# Patient Record
Sex: Female | Born: 1955 | Race: White | Hispanic: No | Smoking: Never smoker
Health system: Southern US, Community
[De-identification: ages and names within clinical notes are randomized; demographics above are authoritative.]

## PROBLEM LIST (undated history)

## (undated) DIAGNOSIS — R0789 Other chest pain: Secondary | ICD-10-CM

## (undated) DIAGNOSIS — F419 Anxiety disorder, unspecified: Secondary | ICD-10-CM

## (undated) DIAGNOSIS — I10 Essential (primary) hypertension: Secondary | ICD-10-CM

## (undated) DIAGNOSIS — R7303 Prediabetes: Secondary | ICD-10-CM

## (undated) DIAGNOSIS — F32A Depression, unspecified: Secondary | ICD-10-CM

## (undated) DIAGNOSIS — C50919 Malignant neoplasm of unspecified site of unspecified female breast: Secondary | ICD-10-CM

## (undated) DIAGNOSIS — K589 Irritable bowel syndrome without diarrhea: Secondary | ICD-10-CM

## (undated) DIAGNOSIS — F329 Major depressive disorder, single episode, unspecified: Secondary | ICD-10-CM

## (undated) DIAGNOSIS — S0990XA Unspecified injury of head, initial encounter: Secondary | ICD-10-CM

## (undated) DIAGNOSIS — R002 Palpitations: Secondary | ICD-10-CM

## (undated) HISTORY — DX: Malignant neoplasm of unspecified site of unspecified female breast: C50.919

## (undated) HISTORY — DX: Unspecified injury of head, initial encounter: S09.90XA

## (undated) HISTORY — DX: Irritable bowel syndrome, unspecified: K58.9

## (undated) HISTORY — PX: OTHER SURGICAL HISTORY: SHX169

## (undated) HISTORY — DX: Palpitations: R00.2

## (undated) HISTORY — DX: Anxiety disorder, unspecified: F41.9

## (undated) HISTORY — DX: Other chest pain: R07.89

---

## 1997-12-30 ENCOUNTER — Other Ambulatory Visit: Admission: RE | Admit: 1997-12-30 | Discharge: 1997-12-30 | Payer: Self-pay | Admitting: Obstetrics and Gynecology

## 1998-08-31 ENCOUNTER — Ambulatory Visit (HOSPITAL_COMMUNITY): Admission: RE | Admit: 1998-08-31 | Discharge: 1998-08-31 | Payer: Self-pay | Admitting: Obstetrics and Gynecology

## 1998-08-31 ENCOUNTER — Encounter: Payer: Self-pay | Admitting: Obstetrics and Gynecology

## 1998-09-02 ENCOUNTER — Encounter: Payer: Self-pay | Admitting: Obstetrics and Gynecology

## 1998-09-02 ENCOUNTER — Ambulatory Visit (HOSPITAL_COMMUNITY): Admission: RE | Admit: 1998-09-02 | Discharge: 1998-09-02 | Payer: Self-pay | Admitting: Obstetrics and Gynecology

## 1999-09-08 ENCOUNTER — Encounter: Payer: Self-pay | Admitting: Obstetrics & Gynecology

## 1999-09-08 ENCOUNTER — Ambulatory Visit (HOSPITAL_COMMUNITY): Admission: RE | Admit: 1999-09-08 | Discharge: 1999-09-08 | Payer: Self-pay | Admitting: Obstetrics & Gynecology

## 1999-09-15 ENCOUNTER — Ambulatory Visit (HOSPITAL_COMMUNITY): Admission: RE | Admit: 1999-09-15 | Discharge: 1999-09-15 | Payer: Self-pay | Admitting: Obstetrics & Gynecology

## 1999-09-15 ENCOUNTER — Encounter: Payer: Self-pay | Admitting: Obstetrics & Gynecology

## 1999-09-26 ENCOUNTER — Other Ambulatory Visit: Admission: RE | Admit: 1999-09-26 | Discharge: 1999-09-26 | Payer: Self-pay | Admitting: Obstetrics & Gynecology

## 2000-11-29 ENCOUNTER — Ambulatory Visit (HOSPITAL_COMMUNITY): Admission: RE | Admit: 2000-11-29 | Discharge: 2000-11-29 | Payer: Self-pay | Admitting: Family Medicine

## 2000-11-29 ENCOUNTER — Encounter: Payer: Self-pay | Admitting: Family Medicine

## 2002-02-26 ENCOUNTER — Encounter: Payer: Self-pay | Admitting: Unknown Physician Specialty

## 2002-02-26 ENCOUNTER — Ambulatory Visit (HOSPITAL_COMMUNITY): Admission: RE | Admit: 2002-02-26 | Discharge: 2002-02-26 | Payer: Self-pay | Admitting: Unknown Physician Specialty

## 2003-04-09 ENCOUNTER — Ambulatory Visit (HOSPITAL_COMMUNITY): Admission: RE | Admit: 2003-04-09 | Discharge: 2003-04-09 | Payer: Self-pay | Admitting: Unknown Physician Specialty

## 2003-04-09 ENCOUNTER — Encounter: Payer: Self-pay | Admitting: Unknown Physician Specialty

## 2004-04-21 ENCOUNTER — Ambulatory Visit (HOSPITAL_COMMUNITY): Admission: RE | Admit: 2004-04-21 | Discharge: 2004-04-21 | Payer: Self-pay

## 2004-04-29 ENCOUNTER — Other Ambulatory Visit: Admission: RE | Admit: 2004-04-29 | Discharge: 2004-04-29 | Payer: Self-pay | Admitting: Obstetrics & Gynecology

## 2005-05-26 ENCOUNTER — Ambulatory Visit (HOSPITAL_COMMUNITY): Admission: RE | Admit: 2005-05-26 | Discharge: 2005-05-26 | Payer: Self-pay | Admitting: Obstetrics & Gynecology

## 2005-10-19 ENCOUNTER — Other Ambulatory Visit: Admission: RE | Admit: 2005-10-19 | Discharge: 2005-10-19 | Payer: Self-pay | Admitting: Obstetrics & Gynecology

## 2006-01-01 ENCOUNTER — Ambulatory Visit: Payer: Self-pay | Admitting: Family Medicine

## 2006-01-04 ENCOUNTER — Ambulatory Visit: Payer: Self-pay | Admitting: Family Medicine

## 2006-01-15 ENCOUNTER — Ambulatory Visit: Payer: Self-pay | Admitting: Family Medicine

## 2006-02-19 ENCOUNTER — Ambulatory Visit: Payer: Self-pay | Admitting: Family Medicine

## 2006-04-03 ENCOUNTER — Ambulatory Visit: Payer: Self-pay | Admitting: Family Medicine

## 2006-05-02 ENCOUNTER — Ambulatory Visit: Payer: Self-pay | Admitting: Family Medicine

## 2006-05-11 ENCOUNTER — Ambulatory Visit: Payer: Self-pay | Admitting: Family Medicine

## 2006-05-30 ENCOUNTER — Ambulatory Visit (HOSPITAL_COMMUNITY): Admission: RE | Admit: 2006-05-30 | Discharge: 2006-05-30 | Payer: Self-pay | Admitting: Obstetrics & Gynecology

## 2006-06-04 ENCOUNTER — Ambulatory Visit: Payer: Self-pay | Admitting: Family Medicine

## 2006-06-15 ENCOUNTER — Ambulatory Visit: Payer: Self-pay | Admitting: Cardiovascular Disease

## 2006-06-19 ENCOUNTER — Telehealth: Payer: Self-pay | Admitting: Family Medicine

## 2006-09-04 ENCOUNTER — Ambulatory Visit: Payer: Self-pay | Admitting: Internal Medicine

## 2006-10-18 ENCOUNTER — Ambulatory Visit: Payer: Self-pay | Admitting: Internal Medicine

## 2006-10-22 ENCOUNTER — Ambulatory Visit: Payer: Self-pay | Admitting: Internal Medicine

## 2006-11-02 ENCOUNTER — Ambulatory Visit: Payer: Self-pay | Admitting: Internal Medicine

## 2006-11-16 ENCOUNTER — Ambulatory Visit: Payer: Self-pay | Admitting: Internal Medicine

## 2006-12-10 ENCOUNTER — Ambulatory Visit: Payer: Self-pay | Admitting: Internal Medicine

## 2007-01-14 ENCOUNTER — Ambulatory Visit: Payer: Self-pay | Admitting: Internal Medicine

## 2007-04-19 ENCOUNTER — Ambulatory Visit: Payer: Self-pay | Admitting: Cardiovascular Disease

## 2007-06-10 ENCOUNTER — Ambulatory Visit (HOSPITAL_COMMUNITY): Admission: RE | Admit: 2007-06-10 | Discharge: 2007-06-10 | Payer: Self-pay | Admitting: Obstetrics & Gynecology

## 2007-08-03 ENCOUNTER — Emergency Department (HOSPITAL_COMMUNITY): Admission: EM | Admit: 2007-08-03 | Discharge: 2007-08-03 | Payer: Self-pay | Admitting: Emergency Medicine

## 2007-08-24 ENCOUNTER — Emergency Department (HOSPITAL_COMMUNITY): Admission: EM | Admit: 2007-08-24 | Discharge: 2007-08-24 | Payer: Self-pay | Admitting: Family Medicine

## 2007-11-29 ENCOUNTER — Ambulatory Visit: Payer: Self-pay | Admitting: Cardiovascular Disease

## 2007-12-11 ENCOUNTER — Ambulatory Visit: Payer: Self-pay

## 2007-12-11 ENCOUNTER — Encounter: Payer: Self-pay | Admitting: Cardiovascular Disease

## 2008-03-25 ENCOUNTER — Ambulatory Visit: Payer: Self-pay | Admitting: Obstetrics & Gynecology

## 2008-03-25 ENCOUNTER — Encounter: Payer: Self-pay | Admitting: Obstetrics & Gynecology

## 2008-04-23 ENCOUNTER — Ambulatory Visit: Payer: Self-pay | Admitting: Family Medicine

## 2008-05-19 ENCOUNTER — Telehealth (INDEPENDENT_AMBULATORY_CARE_PROVIDER_SITE_OTHER): Payer: Self-pay | Admitting: Occupational Medicine

## 2008-06-30 ENCOUNTER — Ambulatory Visit (HOSPITAL_COMMUNITY): Admission: RE | Admit: 2008-06-30 | Discharge: 2008-06-30 | Payer: Self-pay | Admitting: Obstetrics & Gynecology

## 2008-09-17 ENCOUNTER — Encounter: Payer: Self-pay | Admitting: Cardiovascular Disease

## 2008-09-17 ENCOUNTER — Ambulatory Visit: Payer: Self-pay | Admitting: Cardiovascular Disease

## 2008-09-25 ENCOUNTER — Telehealth (INDEPENDENT_AMBULATORY_CARE_PROVIDER_SITE_OTHER): Payer: Self-pay | Admitting: *Deleted

## 2008-10-23 ENCOUNTER — Emergency Department (HOSPITAL_COMMUNITY): Admission: EM | Admit: 2008-10-23 | Discharge: 2008-10-23 | Payer: Self-pay | Admitting: Emergency Medicine

## 2008-12-10 ENCOUNTER — Ambulatory Visit: Payer: Self-pay | Admitting: Family Medicine

## 2009-02-22 ENCOUNTER — Telehealth (INDEPENDENT_AMBULATORY_CARE_PROVIDER_SITE_OTHER): Payer: Self-pay | Admitting: *Deleted

## 2009-02-23 ENCOUNTER — Encounter: Payer: Self-pay | Admitting: Cardiovascular Disease

## 2009-04-21 ENCOUNTER — Ambulatory Visit: Payer: Self-pay | Admitting: Cardiology

## 2009-04-21 ENCOUNTER — Encounter: Payer: Self-pay | Admitting: Cardiology

## 2009-04-28 ENCOUNTER — Ambulatory Visit: Payer: Self-pay | Admitting: Obstetrics & Gynecology

## 2009-04-28 ENCOUNTER — Encounter: Payer: Self-pay | Admitting: Obstetrics & Gynecology

## 2009-08-05 ENCOUNTER — Ambulatory Visit (HOSPITAL_COMMUNITY): Admission: RE | Admit: 2009-08-05 | Discharge: 2009-08-05 | Payer: Self-pay | Admitting: Obstetrics & Gynecology

## 2009-08-19 ENCOUNTER — Ambulatory Visit: Payer: Self-pay | Admitting: Family Medicine

## 2009-08-21 ENCOUNTER — Ambulatory Visit: Payer: Self-pay | Admitting: Family Medicine

## 2009-08-21 ENCOUNTER — Telehealth: Payer: Self-pay | Admitting: Family Medicine

## 2009-12-09 ENCOUNTER — Telehealth (INDEPENDENT_AMBULATORY_CARE_PROVIDER_SITE_OTHER): Payer: Self-pay | Admitting: *Deleted

## 2010-01-14 ENCOUNTER — Telehealth: Payer: Self-pay | Admitting: Cardiology

## 2010-01-17 ENCOUNTER — Telehealth: Payer: Self-pay | Admitting: Cardiology

## 2010-01-19 ENCOUNTER — Ambulatory Visit: Payer: Self-pay | Admitting: Obstetrics & Gynecology

## 2010-02-08 ENCOUNTER — Telehealth: Payer: Self-pay | Admitting: Cardiovascular Disease

## 2010-03-21 ENCOUNTER — Telehealth: Payer: Self-pay | Admitting: Cardiology

## 2010-03-23 ENCOUNTER — Ambulatory Visit: Payer: Self-pay | Admitting: Family Medicine

## 2010-03-25 ENCOUNTER — Telehealth (INDEPENDENT_AMBULATORY_CARE_PROVIDER_SITE_OTHER): Payer: Self-pay | Admitting: *Deleted

## 2010-03-29 ENCOUNTER — Ambulatory Visit: Payer: Self-pay | Admitting: Cardiovascular Disease

## 2010-06-29 ENCOUNTER — Ambulatory Visit: Payer: Self-pay | Admitting: Obstetrics & Gynecology

## 2010-08-24 ENCOUNTER — Ambulatory Visit
Admission: RE | Admit: 2010-08-24 | Discharge: 2010-08-24 | Payer: Self-pay | Source: Home / Self Care | Admitting: Emergency Medicine

## 2010-08-24 ENCOUNTER — Ambulatory Visit (HOSPITAL_COMMUNITY): Admission: RE | Admit: 2010-08-24 | Payer: Self-pay | Source: Home / Self Care | Admitting: Obstetrics & Gynecology

## 2010-08-30 ENCOUNTER — Telehealth: Payer: Self-pay | Admitting: Cardiovascular Disease

## 2010-08-31 ENCOUNTER — Encounter: Payer: Self-pay | Admitting: Obstetrics & Gynecology

## 2010-09-01 NOTE — Progress Notes (Signed)
Summary: QUESTIONS RE LAB  Phone Note Call from Patient Call back at Home Phone [phone]   Caller: Patient [phone] Reason for Call: Talk to Nurse Summary of Call: PT LIVES IN [place] AND DOESN'T HAVE A PC DR-WANTS TO KNOW IF SHE CAN GET LAB WORK DONE AT THE [place] [facility], PLS CALL Initial call taken by: Glynda Jaeger,  January 14, 2010 9:53 AM  Follow-up for Phone Call        spoke with pt, she would like to have her cholestrol checked. she is going to see the GYN next wednesday. she will call after that appt to get paperwork for labs [name], RN  January 14, 2010 6:06 PM

## 2010-09-01 NOTE — Progress Notes (Signed)
Summary: refill request  Phone Note Refill Request Message from:  Patient on March 21, 2010 8:49 AM  Refills Requested: Medication #1:  CITALOPRAM HYDROBROMIDE 10 MG TABS Take 1 tablet by mouth once a day [facility] [phone] request [place] to refill-doesn't have a pcp right now   Method Requested: Telephone to Pharmacy Initial call taken by: Glynda Jaeger,  March 21, 2010 8:50 AM Caller: Patient    Prescriptions: CITALOPRAM HYDROBROMIDE 10 MG TABS (CITALOPRAM HYDROBROMIDE) Take 1 tablet by mouth once a day  #30 x 5   Entered by:   Kem Parkinson   Authorized by:   Ferman Hamming, MD, [facility]   Signed by:   Kem Parkinson on 03/21/2010   Method used:   [facility]ronically to        [phone][facility] (retail)       [address]       [place], [place]  [address]       Ph: [phone] or [phone]       Fax: [phone]   RxID:   [id number]

## 2010-09-01 NOTE — Assessment & Plan Note (Signed)
Summary: rov/ gd   History of Present Illness: [name] returns today for followup.  She's had atypical chest pain palpitations and dyspnea.  She continues to have significant anxiety.  Unfortunately she does not have a primary care doctor.  3 years ago she was kicked out at Dr. Raphael Gibney practice and [name] primary [facility].  She tends to have a lot of stress with her relationship with her family in particular her son.  This seemed to improve slightly but still is a big problem.  Said occasional episodes of atypical chest pain that clearly related to excess coffee intake and anxiety.  He had a normal stress echo last year.  She also has occasional palpitations.  They summoned 9 and also related to stress and caffeine use.  He denies taking any benzodiazepines at this point.  She would like to get reestablished with a primary care doctor.  I told her we try to get [facility] into [facility].  she appears to have some mild chronic bronchitis.  I believe she has a single core inhaler that was given to her by Dr. Jonny Ruiz at one point.  He complains of occasional hoarseness and a dry cough but no frank asthma or wheezing.  Current Problems (verified): 1)  Paronychia, Great Toe  (ICD-681.11) 2)  Otitis Media, Serous, Acute, Left  (ICD-381.01) 3)  Acute Sinusitis, Unspecified  (ICD-461.9) 4)  Ankle Sprain, Left  (ICD-845.00) 5)  Palpitations  (ICD-785.1) 6)  Dyspnea  (ICD-786.05) 7)  Chest Pain, Atypical, Hx of  (ICD-V15.89) 8)  Other Anxiety States  (ICD-300.09)  Current Medications (verified): 1)  Citalopram Hydrobromide 10 Mg Tabs (Citalopram Hydrobromide) .... Take 1 Tablet By Mouth Once A Day 2)  Symbicort 160-4.5 Mcg/act Aero (Budesonide-Formoterol Fumarate) .... 2 Puffs Twice A Day 3)  Activella 0.5-0.1 Mg Tabs (Estradiol-Norethindrone Acet) .... Take 1 Tablet By Mouth Once A Day 4)  Calcium Carbonate-Vitamin D 600-400 Mg-Unit  Tabs (Calcium Carbonate-Vitamin D) .... Take 1 Tablet By Mouth Two  Times A Day 5)  Garlic Oil 1000 Mg Caps (Garlic) .... Take 1 Capsule By Mouth Two Times A Day 6)  Fish Oil 1000 Mg Caps (Omega-3 Fatty Acids) .... Take 1 Capsule By Mouth Once A Day 7)  Vitamin B Complex-C   Caps (B Complex-C) .... Take 1 Capsule By Mouth Once A Day 8)  Vitamin C 500 Mg  Tabs (Ascorbic Acid) .... Take 1 Tablet By Mouth Once A Day 9)  Cephalexin 500 Mg Caps (Cephalexin) .... One By Mouth Two Times A Day 10)  Proair Hfa 108 (90 Base) Mcg/act Aers (Albuterol Sulfate) .... Two Inhalations Q4-6hr As Needed.  Max 12 Puffs/day  Allergies (verified): 1)  ! * Penicilin  Past History:  Past Medical History: Last updated: 03/23/2010 heart palpitations, hx of MVP chest pain , atypical Anxiety IBS asthma  Past Surgical History: Last updated: 05/08/2006 eAB x 2  Family History: Last updated: 05/08/2006 brother ETOH, brother PTSD, father died at 59 of AMI, mother had breast ca at 5, died of lung ca, depression  Social History: Last updated: 03/23/2010 Divorced.  Grown son lives in [place].  Nonsmoker. Alcohol use-no Drug use-no  Review of Systems       Denies fever, malais, weight loss, blurry vision, decreased visual acuity, cough, sputum,  hemoptysis, pleuritic pain, , heartburn, abdominal pain, melena, lower extremity edema, claudication, or rash.   Vital Signs:  Patient profile:   55 year old female Height:      85  inches Weight:      192 pounds BMI:     32.07 Pulse rate:   65 / minute Resp:     16 per minute BP sitting:   128 / 76  (left arm)  Vitals Entered By: Kem Parkinson (March 29, 2010 11:10 AM)  Physical Exam  General:  Affect appropriate Healthy:  appears stated age HEENT: normal Neck supple with no adenopathy JVP normal no bruits no thyromegaly Lungs clear with no wheezing and good diaphragmatic motion Heart:  S1/S2 no murmur,rub, gallop or click PMI normal Abdomen: benighn, BS positve, no tenderness, no AAA no bruit.  No HSM or  HJR Distal pulses intact with no bruits No edema Neuro non-focal Skin warm and dry    Impression & Recommendations:  Problem # 1:  PALPITATIONS (ICD-785.1) Benign related to anxiety  Problem # 2:  DYSPNEA (ICD-786.05) Functional.  Normal EF and normal cardiopulmonary exam  ? Component of lung disease.  Will try to fill out forms for patient  assistance and Symbicort  Problem # 3:  CHEST PAIN, ATYPICAL, HX OF (ICD-V15.89) Atypical with previously normal stress test.  No need for further w/u at this time  Patient Instructions: 1)  Your physician recommends that you schedule a follow-up appointment in: ONE YEAR

## 2010-09-01 NOTE — Assessment & Plan Note (Signed)
Summary: CHK ON TOENAIL AND BREATHING PROBLEMS (5)   Vital Signs:  Patient Profile:   55 Years Old Female CC:      right great toe pain, SOB at times  Height:     65 inches Weight:      193 pounds O2 Sat:      97 % O2 treatment:    Room Air Temp:     98.2 degrees F oral Pulse rate:   74 / minute Resp:     16 per minute BP sitting:   134 / 82  (left arm) Cuff size:   regular  Pt. in pain?   no  Vitals Entered By: Lajean Saver RN (March 23, 2010 8:30 AM)                   Updated Prior Medication List: CITALOPRAM HYDROBROMIDE 10 MG TABS (CITALOPRAM HYDROBROMIDE) Take 1 tablet by mouth once a day SYMBICORT 160-4.5 MCG/ACT AERO (BUDESONIDE-FORMOTEROL FUMARATE) 2 puffs twice a day ACTIVELLA 0.5-0.1 MG TABS (ESTRADIOL-NORETHINDRONE ACET) Take 1 tablet by mouth once a day CALCIUM CARBONATE-VITAMIN D 600-400 MG-UNIT  TABS (CALCIUM CARBONATE-VITAMIN D) Take 1 tablet by mouth two times a day GARLIC OIL 1000 MG CAPS (GARLIC) Take 1 capsule by mouth two times a day FISH OIL 1000 MG CAPS (OMEGA-3 FATTY ACIDS) Take 1 capsule by mouth once a day VITAMIN B COMPLEX-C   CAPS (B COMPLEX-C) Take 1 capsule by mouth once a day VITAMIN C 500 MG  TABS (ASCORBIC ACID) Take 1 tablet by mouth once a day  Current Allergies (reviewed today): ! * PENICILINHistory of Present Illness Chief Complaint: right great toe pain, SOB at times  History of Present Illness:  Subjective:  Patient presents with two complaints: 1)  Several day history of pain in her right great toenail.  She manipulated the medial edge that she thought was ingrown and a small amount of pus came out; now toe feels better. 2)  She complains of mild cough and congestion in the mornings, resolving after she is up and about.  She has resumed her symbicort inhaler as perscribed.  No chest pain.  She occasionally feels shortness of breath that resolves when she clears her throat.  No swelling in legs.  Her shortness of breath improves with  albuterol.   REVIEW OF SYSTEMS Constitutional Symptoms      Denies fever, chills, night sweats, weight loss, weight gain, and fatigue.  Eyes       Denies change in vision, eye pain, eye discharge, glasses, contact lenses, and eye surgery. Ear/Nose/Throat/Mouth       Denies hearing loss/aids, change in hearing, ear pain, ear discharge, dizziness, frequent runny nose, frequent nose bleeds, sinus problems, sore throat, hoarseness, and tooth pain or bleeding.  Respiratory       Complains of dry cough and shortness of breath.      Denies productive cough, wheezing, asthma, bronchitis, and emphysema/COPD.  Cardiovascular       Denies murmurs, chest pain, and tires easily with exhertion.    Gastrointestinal       Denies stomach pain, nausea/vomiting, diarrhea, constipation, blood in bowel movements, and indigestion. Genitourniary       Denies painful urination, kidney stones, and loss of urinary control. Neurological       Denies paralysis, seizures, and fainting/blackouts. Musculoskeletal       Denies muscle pain, joint pain, joint stiffness, decreased range of motion, redness, swelling, muscle weakness, and gout.  Skin  Complains of hair/skni or nail changes.      Denies bruising and unusual mles/lumps or sores.      Comments: right great toe Psych       Denies mood changes, temper/anger issues, anxiety/stress, speech problems, depression, and sleep problems. Other Comments: Patient removed ingrown toenail couple days ago from right great toe, wants doctor to check on it. She c/o of periods of SOB recently and states she wanst to be "checked out". She does not appear to be in any current distress. 02sat 97%, RR 16   Past History:  Past Medical History: heart palpitations, hx of MVP chest pain , atypical Anxiety IBS asthma  Family History: Reviewed history from 05/08/2006 and no changes required. brother ETOH, brother PTSD, father died at 17 of AMI, mother had breast ca at 71,  died of lung ca, depression  Social History: Divorced.  Grown son lives in [place].  Nonsmoker. Alcohol use-no Drug use-no Drug Use:  no   Objective:  Appearance:  Patient appears obese but otherwise healthy, stated age, and in no acute distress  Eyes:  Pupils are equal, round, and reactive to light and accomdation.  Extraocular movement is intact.  Conjunctivae are not inflamed.  Pharynx:  Normal  Neck:  Supple.  No adenopathy is present.  No thyromegaly is present  Lungs:  Clear to auscultation.  Breath sounds are equal.  Heart:  Regular rate and rhythm without murmurs, rubs, or gallops.  Abdomen:  Nontender without masses or hepatosplenomegaly.  Bowel sounds are present.  No CVA or flank tenderness.  Extremities:  No edema.  Pedal pulses are full and equal.  Right great toe:  No swelling or erythema.  Mild tenderness medial edge of toe; does not presently appear ingrown. Assessment  Assessed DYSPNEA as deteriorated - Donna Christen MD New Problems: PARONYCHIA, GREAT TOE (ICD-681.11)  SUSPECT REFLUX AS SOURCE OF MORNING COUGH, EXACERBATED BY OBESITY AND ASTHMA.  Note that patient had evaluation by cardiologist Dr. Jens Som last year. MILD PARONYCHIA RIGHT GREAT TOE.  Plan New Medications/Changes: PROAIR HFA 108 (90 BASE) MCG/ACT AERS (ALBUTEROL SULFATE) Two inhalations q4-6hr as needed.  Max 12 puffs/day  #1 MDI x 0, 03/23/2010, Donna Christen MD CEPHALEXIN 500 MG CAPS (CEPHALEXIN) One by mouth two times a day  #14 x 0, 03/23/2010, Donna Christen MD  New Orders: Est. Patient Level IV [16109] Planning Comments:   Begin Keflex for 5 days; warm soaks to toe. Given a Mickel Crow patient information and instruction sheet on topic ingrown nails. Trial of Zantac 75, one at bedtime for a month.  Given a Water quality scientist patient information and instruction sheet on topic GERD. Continue Symbicort inhaler.  Rx for new albuterol rescue inhaler.  Suggest high efficiency filter in her heat pump  system. Recommend diet, exercise, and weight loss.  Follow-up with PCP if not improving.   The patient and/or caregiver has been counseled thoroughly with regard to medications prescribed including dosage, schedule, interactions, rationale for use, and possible side effects and they verbalize understanding.  Diagnoses and expected course of recovery discussed and will return if not improved as expected or if the condition worsens. Patient and/or caregiver verbalized understanding.  Prescriptions: PROAIR HFA 108 (90 BASE) MCG/ACT AERS (ALBUTEROL SULFATE) Two inhalations q4-6hr as needed.  Max 12 puffs/day  #1 MDI x 0   Entered and Authorized by:   Donna Christen MD   Signed by:   Donna Christen MD on 03/23/2010   Method used:   Print then Give  to Patient   RxID:   [id number] CEPHALEXIN 500 MG CAPS (CEPHALEXIN) One by mouth two times a day  #14 x 0   Entered and Authorized by:   Donna Christen MD   Signed by:   Donna Christen MD on 03/23/2010   Method used:   Print then Give to Patient   RxID:   [id number]   Patient Instructions: 1)  Elevate head of bed. 2)  Begin Zantac 75, one tab at bedtime. 3)  Avoid bedtime snacks. 4)  Resume Symbicort   Orders Added: 1)  Est. Patient Level IV [95284]

## 2010-09-01 NOTE — Assessment & Plan Note (Signed)
Summary: BOTH EARS STOPPED UP/WB room 5   Vital Signs:  Patient Profile:   55 Years Old Female CC:      bilateral ear pain Height:     65 inches Weight:      189.8 pounds O2 Sat:      97 % O2 treatment:    Room Air Temp:     98.5 degrees F oral Pulse rate:   90 / minute Resp:     18 per minute BP sitting:   144 / 78  (left arm) Cuff size:   regular  Vitals Entered By: Clemens Catholic LPN (August 24, 2010 8:23 AM)                  Updated Prior Medication List: CITALOPRAM HYDROBROMIDE 10 MG TABS (CITALOPRAM HYDROBROMIDE) Take 1 tablet by mouth once a day SYMBICORT 160-4.5 MCG/ACT AERO (BUDESONIDE-FORMOTEROL FUMARATE) 2 puffs twice a day ACTIVELLA 0.5-0.1 MG TABS (ESTRADIOL-NORETHINDRONE ACET) Take 1 tablet by mouth once a day CALCIUM CARBONATE-VITAMIN D 600-400 MG-UNIT  TABS (CALCIUM CARBONATE-VITAMIN D) Take 1 tablet by mouth two times a day GARLIC OIL 1000 MG CAPS (GARLIC) Take 1 capsule by mouth two times a day FISH OIL 1000 MG CAPS (OMEGA-3 FATTY ACIDS) Take 1 capsule by mouth once a day VITAMIN B COMPLEX-C   CAPS (B COMPLEX-C) Take 1 capsule by mouth once a day VITAMIN C 500 MG  TABS (ASCORBIC ACID) Take 1 tablet by mouth once a day  Current Allergies (reviewed today): ! * PENICILINHistory of Present Illness Chief Complaint: bilateral ear pain History of Present Illness: 55 Years Old Female complains of onset of cold symptoms fora few weeks.  Emaley has been using Afrin which is helping a little bit. No sore throat + cough No pleuritic pain No wheezing + nasal congestion + post-nasal drainage No sinus pain/pressure No chest congestion No itchy/red eyes + earache (feels stopped up) No hemoptysis No SOB No chills/sweats No fever No nausea No vomiting No abdominal pain No diarrhea No skin rashes No fatigue No myalgias No headache   REVIEW OF SYSTEMS Constitutional Symptoms       Complains of fatigue.     Denies fever, chills, night sweats, weight loss,  and weight gain.  Eyes       Denies change in vision, eye pain, eye discharge, glasses, contact lenses, and eye surgery. Ear/Nose/Throat/Mouth       Complains of change in hearing, ear discharge, frequent runny nose, sinus problems, sore throat, and hoarseness.      Denies hearing loss/aids, ear pain, dizziness, frequent nose bleeds, and tooth pain or bleeding.  Respiratory       Denies dry cough, productive cough, wheezing, shortness of breath, asthma, bronchitis, and emphysema/COPD.  Cardiovascular       Complains of tires easily with exhertion.      Denies murmurs and chest pain.    Gastrointestinal       Denies stomach pain, nausea/vomiting, diarrhea, constipation, blood in bowel movements, and indigestion. Genitourniary       Denies painful urination, kidney stones, and loss of urinary control. Neurological       Denies paralysis, seizures, and fainting/blackouts. Musculoskeletal       Denies muscle pain, joint pain, joint stiffness, decreased range of motion, redness, swelling, muscle weakness, and gout.  Skin       Denies bruising, unusual mles/lumps or sores, and hair/skin or nail changes.  Psych       Denies mood changes,  temper/anger issues, anxiety/stress, speech problems, depression, and sleep problems. Other Comments: pt c/o humming in LT ear for a few wks. worse for the past 3-4 days. now bilateral feels clogged/swimmers ear. she also states that she feels fatigued and has some allergy s/s possible related to her dog and dust.   Past History:  Past Medical History: Reviewed history from 03/23/2010 and no changes required. heart palpitations, hx of MVP chest pain , atypical Anxiety IBS asthma  Past Surgical History: Reviewed history from 05/08/2006 and no changes required. eAB x 2  Family History: Reviewed history from 05/08/2006 and no changes required. brother ETOH, brother PTSD, father died at 38 of AMI, mother had breast ca at 49, died of lung ca,  depression  Social History: Reviewed history from 03/23/2010 and no changes required. Divorced.  Grown son lives in [place].  Nonsmoker. Alcohol use-no Drug use-no Physical Exam General appearance: well developed, well nourished, no acute distress Ears: clear fluid behind both TM's, no erythema, no sign of infection Nasal: clear discharge Oral/Pharynx: clear PND, no erythema Neck: neck supple,  trachea midline, no masses Chest/Lungs: no rales, wheezes, or rhonchi bilateral, breath sounds equal without effort Heart: regular rate and  rhythm, no murmur MSE: oriented to time, place, and person Assessment New Problems: ALLERGIC RHINITIS (ICD-477.9)   Plan New Medications/Changes: FLONASE 50 MCG/ACT SUSP (FLUTICASONE PROPIONATE) 2 sprays both nostrils QAM  #1 x 3, 08/24/2010, Hoyt Koch MD  New Orders: Est. Patient Level III [phone] Pulse Oximetry (single measurment) [94760] Planning Comments:   Claritin daily for a month Sudafed 12-hour twice a day for 5 days Rx for Flonase Increase water intake Follow-up with your primary care physician if not improving or if getting worse   The patient and/or caregiver has been counseled thoroughly with regard to medications prescribed including dosage, schedule, interactions, rationale for use, and possible side effects and they verbalize understanding.  Diagnoses and expected course of recovery discussed and will return if not improved as expected or if the condition worsens. Patient and/or caregiver verbalized understanding.  Prescriptions: FLONASE 50 MCG/ACT SUSP (FLUTICASONE PROPIONATE) 2 sprays both nostrils QAM  #1 x 3   Entered and Authorized by:   Hoyt Koch MD   Signed by:   Hoyt Koch MD on 08/24/2010   Method used:   Print then Give to Patient   RxID:   [id number]   Orders Added: 1)  Est. Patient Level III [95621] 2)  Pulse Oximetry (single measurment) [30865]

## 2010-09-01 NOTE — Progress Notes (Signed)
Summary: speak to nurse/blood work  Phone Note Call from Patient Call back at [phone]   Reason for Call: Talk to Nurse Summary of Call: request to speak to the nurse about blood work Initial call taken by: Migdalia Dk,  January 17, 2010 8:32 AM  Follow-up for Phone Call        spoke with pt, she would like to have some blood work checked. she wants to check her blood sugar, lipids,cbc and tsh. she does not have a primary care md and ask if we could [facility] [place]. she states she feel sluggish. she has an appt with her GYN this week and will add her blood work to ours. will get okay from dr Jens Som to order [name], RN  January 17, 2010 3:59 PM   Additional Follow-up for Phone Call Additional follow up Details #1::        Would ask patient to schedule with a primary care (possibly Dr. Cathey Endow or Dr. Linford Arnold if she lives in [place]). Ferman Hamming, MD, [facility]  January 18, 2010 12:45 PM  Left message to call back [name], RN  January 19, 2010 11:57 AM  per pt calling back to speak with [name].  spoke w/Debra advised Dr Jens Som wanted her to get a pcp she states she has no Insurance so no pcps will accept her b/c she cant pay $200-300 up front, she states she use to see Dr Cathey Endow and was kicked out of their pratice to a disagreement over her care, will let Dr Jens Som know Meredith Staggers, RN  January 19, 2010 1:01 PM   Per pt calling is aware of the message that was pass along from dr. Jens Som. pt wants to see if Dr. Eden Emms will approved blood work since rapor with him. [phone] Lorne Skeens  January 20, 2010 8:52 AM      Additional Follow-up for Phone Call Additional follow up Details #2::    Patient needs a primary care physician to follow noncardiac issues. Ferman Hamming, MD, [facility]  January 19, 2010 2:37 PM

## 2010-09-01 NOTE — Progress Notes (Signed)
  Phone Note Outgoing Call Call back at [facility] Phone [phone]   Call placed by: Lajean Saver RN,  March 25, 2010 2:17 PM Call placed to: Patient Action Taken: Phone Call Completed Summary of Call: Call back: Patient says she just filled the rx today and began taking it as prescribed. Her toe is improving and she is using 2 puffs of her inhaler instead of 1. She had not further questions.

## 2010-09-01 NOTE — Progress Notes (Signed)
  Phone Note Call from Patient   Caller: Patient Summary of Call: Patient called states that you gave her Albuterol Inhaler back in January. She can not afford to pay for it. Is there anyway to get her a sample of the medication.Please call [phone]. Nh Initial call taken by: Dannette Barbara,  Dec 09, 2009 9:01 AM    WE DO NOT HAVE ANY SAMPLES

## 2010-09-01 NOTE — Assessment & Plan Note (Signed)
Summary: SINUS & COUGH/KH   Vital Signs:  Patient Profile:   55 Years Old Female CC:      Cold & URI symptoms Height:     65 inches Weight:      188 pounds O2 Sat:      99 % O2 treatment:    Room Air Temp:     98.5 degrees F oral Pulse rate:   89 / minute Resp:     16 per minute BP sitting:   140 / 84  (right arm)  Pt. in pain?   yes    Location:   head    Intensity:   3    Type:       heaviness  Vitals Entered By: Dorise Hiss                   Updated Prior Medication List: CITALOPRAM HYDROBROMIDE 10 MG TABS (CITALOPRAM HYDROBROMIDE) Take 1 tablet by mouth once a day SYMBICORT 160-4.5 MCG/ACT AERO (BUDESONIDE-FORMOTEROL FUMARATE) 2 puffs twice a day ACTIVELLA 0.5-0.1 MG TABS (ESTRADIOL-NORETHINDRONE ACET) Take 1 tablet by mouth once a day CALCIUM CARBONATE-VITAMIN D 600-400 MG-UNIT  TABS (CALCIUM CARBONATE-VITAMIN D) Take 1 tablet by mouth two times a day GARLIC OIL 1000 MG CAPS (GARLIC) Take 1 capsule by mouth two times a day FISH OIL 1000 MG CAPS (OMEGA-3 FATTY ACIDS) Take 1 capsule by mouth once a day VITAMIN B COMPLEX-C   CAPS (B COMPLEX-C) Take 1 capsule by mouth once a day VITAMIN C 500 MG  TABS (ASCORBIC ACID) Take 1 tablet by mouth once a day  Current Allergies (reviewed today): ! * PENICILIN History of Present Illness History from: patient Chief Complaint: Cold & URI symptoms History of Present Illness: AS ABOVE. ONSET 4 DAYS AGO WITH SINUS CONGESTION AND RHINORRHEA. HAD FEVER 100.1 THIS AM. ADMITS TO FACIAL PAIN AND PRESSURE IN EARS. MILD SORE THROAT AND OCC COUGH. TOOK ADVILL AND OTC VIT C.   Current Meds CITALOPRAM HYDROBROMIDE 10 MG TABS (CITALOPRAM HYDROBROMIDE) Take 1 tablet by mouth once a day SYMBICORT 160-4.5 MCG/ACT AERO (BUDESONIDE-FORMOTEROL FUMARATE) 2 puffs twice a day ACTIVELLA 0.5-0.1 MG TABS (ESTRADIOL-NORETHINDRONE ACET) Take 1 tablet by mouth once a day CALCIUM CARBONATE-VITAMIN D 600-400 MG-UNIT  TABS (CALCIUM CARBONATE-VITAMIN D)  Take 1 tablet by mouth two times a day GARLIC OIL 1000 MG CAPS (GARLIC) Take 1 capsule by mouth two times a day FISH OIL 1000 MG CAPS (OMEGA-3 FATTY ACIDS) Take 1 capsule by mouth once a day VITAMIN B COMPLEX-C   CAPS (B COMPLEX-C) Take 1 capsule by mouth once a day VITAMIN C 500 MG  TABS (ASCORBIC ACID) Take 1 tablet by mouth once a day ZITHROMAX TRI-PAK 500 MG TABS (AZITHROMYCIN) TAKE AS DIRECTED  REVIEW OF SYSTEMS Constitutional Symptoms       Complains of fever.     Denies chills, night sweats, weight loss, weight gain, and fatigue.  Eyes       Denies change in vision, eye pain, eye discharge, glasses, contact lenses, and eye surgery. Ear/Nose/Throat/Mouth       Complains of ear discharge, frequent runny nose, sinus problems, and sore throat.      Denies hearing loss/aids, change in hearing, ear pain, dizziness, frequent nose bleeds, hoarseness, and tooth pain or bleeding.  Respiratory       Complains of productive cough.      Denies dry cough, wheezing, shortness of breath, asthma, bronchitis, and emphysema/COPD.  Cardiovascular  Denies murmurs, chest pain, and tires easily with exhertion.    Gastrointestinal       Denies stomach pain, nausea/vomiting, diarrhea, constipation, blood in bowel movements, and indigestion. Genitourniary       Denies painful urination, kidney stones, and loss of urinary control. Neurological       Complains of headaches.      Denies paralysis, seizures, and fainting/blackouts. Musculoskeletal       Denies muscle pain, joint pain, joint stiffness, decreased range of motion, redness, swelling, muscle weakness, and gout.  Skin       Denies bruising, unusual mles/lumps or sores, and hair/skin or nail changes.      Comments: small red rash to Left abdomen Psych       Denies mood changes, temper/anger issues, anxiety/stress, speech problems, depression, and sleep problems.  Past History:  Past Medical History: Last updated: 04/21/2009 heart  palpitations, hx of MVP chest pain , atypical dyspnea related to anxiety Anxiety IBS asthma  Past Surgical History: Last updated: 05/08/2006 eAB x 2  Family History: Last updated: 05/08/2006 brother ETOH, brother PTSD, father died at 71 of AMI, mother had breast ca at 85, died of lung ca, depression  Family History: Reviewed history from 05/08/2006 and no changes required. brother ETOH, brother PTSD, father died at 39 of AMI, mother had breast ca at 9, died of lung ca, depression  Social History: Reviewed history from 04/21/2009 and no changes required. Divorced.  Grown son lives in [place].   Likes to walk and wants to lose wt.   Nonsmoker. Full Time Physical Exam General appearance: well developed, well nourished, no acute distress Head: normocephalic, atraumatic Ears: LEFT TM SLIGHTLY RED Nasal: CONGESTED Oral/Pharynx: tongue normal, posterior pharynx without erythema or exudate Chest/Lungs: no rales, wheezes, or rhonchi bilateral, breath sounds equal without effort Heart: regular rate and  rhythm, no murmur Skin: no obvious rashes or lesions Assessment New Problems: ACUTE SINUSITIS, UNSPECIFIED (ICD-461.9)   Plan New Medications/Changes: ZITHROMAX TRI-PAK 500 MG TABS (AZITHROMYCIN) TAKE AS DIRECTED  #1 x 0, 08/19/2009, Marvis Moeller DO  New Orders: Est. Patient Level III [99213]   Prescriptions: ZITHROMAX TRI-PAK 500 MG TABS (AZITHROMYCIN) TAKE AS DIRECTED  #1 x 0   Entered and Authorized by:   Marvis Moeller DO   Signed by:   Marvis Moeller DO on 08/19/2009   Method used:   [facility] to        [facility] [phone][facility] (retail)       [address]       [place], [place]  [address]       Ph: [phone] or [phone]       Fax: [phone]   RxID:   [id number]   Patient Instructions: 1)  TYLENOL OR MOTRIN AS NEEDED. MUCINEX D RECOMMENDED. AVOID CAFFEINE AND MILK PRODUCTS. FOLLOW UP WITH YOUR PCP IF SYMPTOMS PERSIST OR WORSEN.

## 2010-09-01 NOTE — Progress Notes (Signed)
Summary: having some irregular heartbeats  Phone Note Call from Patient   Caller: Patient Reason for Call: Talk to Nurse Summary of Call: pt has appt 7-29 but having a lot of stress-having irregular heart beats x 2 days-and really fatigued-took advil and irregular heartbeats have dissapated-pls call [phone] Initial call taken by: Glynda Jaeger,  February 08, 2010 11:54 AM  Follow-up for Phone Call        spoke with pt, questions answered [name], RN  February 08, 2010 3:01 PM

## 2010-09-01 NOTE — Assessment & Plan Note (Signed)
Summary: Cough-yellowish rm 2   Vital Signs:  Patient Profile:   55 Years Old Female CC:      Cold & URI symptoms Height:     65 inches Weight:      187 pounds O2 Sat:      100 % O2 treatment:    Room Air Temp:     97.7 degrees F oral Pulse rate:   71 / minute Pulse rhythm:   regular Resp:     16 per minute BP sitting:   135 / 85  (right arm) Cuff size:   regular  Vitals Entered By: Areta Haber CMA (August 21, 2009 9:33 AM)                  Current Allergies: ! * PENICILIN  History of Present Illness Chief Complaint: Cold & URI symptoms History of Present Illness: Subjective:  Patient complains of persistent sinus pressure despite her azithromycin that was started two days ago.  She has a non-productive cough.  She has night sweats but no fever.  She has a history of chronic rhinitis that is not responding to an OTC allergy med.  No pleuritic pain or shortness of breath  Current Problems: OTITIS MEDIA, SEROUS, ACUTE, LEFT (ICD-381.01) ACUTE SINUSITIS, UNSPECIFIED (ICD-461.9) ANKLE SPRAIN, LEFT (ICD-845.00) PALPITATIONS (ICD-785.1) DYSPNEA (ICD-786.05) CHEST PAIN, ATYPICAL, HX OF (ICD-V15.89) OTHER ANXIETY STATES (ICD-300.09)   Current Meds CITALOPRAM HYDROBROMIDE 10 MG TABS (CITALOPRAM HYDROBROMIDE) Take 1 tablet by mouth once a day SYMBICORT 160-4.5 MCG/ACT AERO (BUDESONIDE-FORMOTEROL FUMARATE) 2 puffs twice a day ACTIVELLA 0.5-0.1 MG TABS (ESTRADIOL-NORETHINDRONE ACET) Take 1 tablet by mouth once a day CALCIUM CARBONATE-VITAMIN D 600-400 MG-UNIT  TABS (CALCIUM CARBONATE-VITAMIN D) Take 1 tablet by mouth two times a day GARLIC OIL 1000 MG CAPS (GARLIC) Take 1 capsule by mouth two times a day FISH OIL 1000 MG CAPS (OMEGA-3 FATTY ACIDS) Take 1 capsule by mouth once a day VITAMIN B COMPLEX-C   CAPS (B COMPLEX-C) Take 1 capsule by mouth once a day VITAMIN C 500 MG  TABS (ASCORBIC ACID) Take 1 tablet by mouth once a day CEFPROZIL 250 MG TABS (CEFPROZIL) 1 by mouth  two times a day BENZONATATE 200 MG CAPS (BENZONATATE) One by mouth HS as needed cough FLUTICASONE PROPIONATE 50 MCG/ACT SUSP (FLUTICASONE PROPIONATE) 2 sprays in each nostril once daily  REVIEW OF SYSTEMS Constitutional Symptoms      Denies fever, chills, night sweats, weight loss, weight gain, and fatigue.  Eyes       Denies change in vision, eye pain, eye discharge, glasses, contact lenses, and eye surgery. Ear/Nose/Throat/Mouth       Denies hearing loss/aids, change in hearing, ear pain, ear discharge, dizziness, frequent runny nose, frequent nose bleeds, sinus problems, sore throat, hoarseness, and tooth pain or bleeding.  Respiratory       Complains of productive cough.      Denies dry cough, wheezing, shortness of breath, asthma, bronchitis, and emphysema/COPD.  Cardiovascular       Denies murmurs, chest pain, and tires easily with exhertion.    Gastrointestinal       Denies stomach pain, nausea/vomiting, diarrhea, constipation, blood in bowel movements, and indigestion. Genitourniary       Denies painful urination, kidney stones, and loss of urinary control. Neurological       Denies paralysis, seizures, and fainting/blackouts. Musculoskeletal       Denies muscle pain, joint pain, joint stiffness, decreased range of motion, redness, swelling, muscle weakness, and  gout.  Skin       Denies bruising, unusual mles/lumps or sores, and hair/skin or nail changes.  Psych       Denies mood changes, temper/anger issues, anxiety/stress, speech problems, depression, and sleep problems. Other Comments: yellowish. Pt has not seen PCP for this   Past History:  Past Medical History: Last updated: 04/21/2009 heart palpitations, hx of MVP chest pain , atypical dyspnea related to anxiety Anxiety IBS asthma  Past Surgical History: Last updated: 05/08/2006 eAB x 2  Family History: Last updated: 05/08/2006 brother ETOH, brother PTSD, father died at 31 of AMI, mother had breast ca at  26, died of lung ca, depression  Social History: Last updated: 04/21/2009 Divorced.  Grown son lives in [place].  Not in a relationship.  Likes to walk and wants to lose wt.  Introvert.  Eats when bored or depressed.  Nonsmoker. Full Time  Risk Factors: Smoking Status: quit (09/16/2008)   Objective:  No acute distress  Eyes:  Pupils are equal, round, and reactive to light and accomdation.  Extraocular movement is intact.  Conjunctivae are not inflamed.  Ears:  Canals normal.   Right tympanic membrane normal, but left tympanic membrane has serous effusion and is slightly plnk. Nose:  Turbinates are edematous bilaterally.  No distinct sinus tenderness Pharynx:  Normal  Neck:  Supple.  No adenopathy is present.  No thyromegaly is present  Lungs:  Clear to auscultation.  Breath sounds are equal.  Heart:  Regular rate and rhythm without murmurs, rubs, or gallops.  Assessment  Assessed ACUTE SINUSITIS, UNSPECIFIED as unchanged - Donna Christen MD New Problems: OTITIS MEDIA, SEROUS, ACUTE, LEFT (ICD-381.01)  VIRAL URI WITH LOW GRADE SECONDARY BACTERIAL SINUSITIS/OTITIS  Plan New Medications/Changes: FLUTICASONE PROPIONATE 50 MCG/ACT SUSP (FLUTICASONE PROPIONATE) 2 sprays in each nostril once daily  #One x 1, 08/21/2009, Donna Christen MD BENZONATATE 200 MG CAPS (BENZONATATE) One by mouth HS as needed cough  #12 x 0, 08/21/2009, Donna Christen MD CEFPROZIL 250 MG TABS (CEFPROZIL) 1 by mouth two times a day  #20 x 0, 08/21/2009, Donna Christen MD  New Orders: Est. Patient Level III [phone] Planning Comments:   Switch to Cefzil for 7 to 10 days.  Guaifenesin expectorant with plenty of fluids.  Topical nasal decongestant and fluticasone nasal spray.  Use Neti pot.  Cough suppressant at night.  May continue Symbicort. Follow-up with PCP if not improving one week. Return for flu shot when well   The patient and/or caregiver has been counseled thoroughly with regard to medications prescribed  including dosage, schedule, interactions, rationale for use, and possible side effects and they verbalize understanding.  Diagnoses and expected course of recovery discussed and will return if not improved as expected or if the condition worsens. Patient and/or caregiver verbalized understanding.  Prescriptions: FLUTICASONE PROPIONATE 50 MCG/ACT SUSP (FLUTICASONE PROPIONATE) 2 sprays in each nostril once daily  #One x 1   Entered and Authorized by:   Donna Christen MD   Signed by:   Donna Christen MD on 08/21/2009   Method used:   Print then Give to Patient   RxID:   [id number] BENZONATATE 200 MG CAPS (BENZONATATE) One by mouth HS as needed cough  #12 x 0   Entered and Authorized by:   Donna Christen MD   Signed by:   Donna Christen MD on 08/21/2009   Method used:   Print then Give to Patient   RxID:   [id number] CEFPROZIL 250 MG TABS (CEFPROZIL) 1 by  mouth two times a day  #20 x 0   Entered and Authorized by:   Donna Christen MD   Signed by:   Donna Christen MD on 08/21/2009   Method used:   Print then Give to Patient   RxID:   [id number]   Patient Instructions: 1)  May use Robitussin for congestion 2)  Increase fluid intake, rest. 3)  May use Afrin nasal spray (or generic oxymetazoline) twice daily for about 5 days.  Also recommend using saline nasal spray several times daily and/or saline nasal irrigation. 4)  Followup with family doctor if not improving one week.

## 2010-09-01 NOTE — Progress Notes (Signed)
Summary: needs cheaper rx  Phone Note Call from Patient Call back at Home Phone [phone]   Summary of Call: Pt. is requesting "cheaper" rx besides the one given to her at todays visit. Stated cost is $50.00. [facility]. Joanne Chars CMA  August 21, 2009 10:48 AM     New/Updated Medications: CEPHALEXIN 500 MG TABS (CEPHALEXIN) One by mouth three times daily (every 8 hours)   Plan:  Will switch to CEPHALEXIN 500 MG TABS (CEPHALEXIN) One by mouth three times daily (every 8 hours).  Rx sent electronically. Donna Christen MD  August 21, 2009 11:30 AM       Prescriptions: CEPHALEXIN 500 MG TABS (CEPHALEXIN) One by mouth three times daily (every 8 hours)  #30 x 0   Entered and Authorized by:   Donna Christen MD   Signed by:   Donna Christen MD on 08/21/2009   Method used:   Electron[facility] to        [phone][facility] (retail)       [address]       [place], [place]  [address]       Ph: [phone] or [phone]       Fax: [phone]   RxID:   [id number]

## 2010-09-01 NOTE — Progress Notes (Signed)
  Phone Note Outgoing Call Call back at [facility] Phone [phone]   Call placed by: Lajean Saver RN,  Dec 09, 2009 3:22 PM Action Taken: Phone Call Completed Details for Reason: return phone call Summary of Call: Patient called asking for samples of albuterol inhaler or a Rx for one. I told her we do not carry samples of albuterol and in order for a RX to be written she would need to come in and be evaluated.  I advised her to set herself up with a PCP.

## 2010-09-06 ENCOUNTER — Other Ambulatory Visit: Payer: Self-pay | Admitting: Obstetrics & Gynecology

## 2010-09-06 DIAGNOSIS — Z1231 Encounter for screening mammogram for malignant neoplasm of breast: Secondary | ICD-10-CM

## 2010-09-07 NOTE — Progress Notes (Signed)
Summary: calling regarding her B/P  Phone Note Call from Patient Call back at Home Phone [phone]   Caller: Patient Summary of Call: PT B/P 144/80 about thtree or four days ago wanted Dr.Erasmo Vertz to j=know this information Initial call taken by: Judie Grieve,  August 30, 2010 8:38 AM  Follow-up for Phone Call        Left message to call back [name], RN  August 30, 2010 9:05 AM  pt calling back took bp was 154/89 but had had caffine-wanted to let you know [name]  August 30, 2010 10:31 AM'  spoke with pt, she has been under alot stress recently. she has also been eating badly and drinking alot of caffine. she is going to watch her diet better and cont to track her bp and let us know how it is running [name], RN  August 30, 2010 11:25 AM

## 2010-09-09 ENCOUNTER — Ambulatory Visit (HOSPITAL_COMMUNITY): Admission: RE | Admit: 2010-09-09 | Payer: Self-pay | Source: Ambulatory Visit

## 2010-09-20 ENCOUNTER — Ambulatory Visit: Payer: Self-pay | Admitting: Obstetrics & Gynecology

## 2010-09-27 ENCOUNTER — Encounter: Payer: Self-pay | Admitting: Family Medicine

## 2010-09-27 ENCOUNTER — Ambulatory Visit: Payer: Self-pay | Admitting: Obstetrics & Gynecology

## 2010-09-27 ENCOUNTER — Ambulatory Visit (INDEPENDENT_AMBULATORY_CARE_PROVIDER_SITE_OTHER): Payer: Self-pay | Admitting: Family Medicine

## 2010-09-27 DIAGNOSIS — L259 Unspecified contact dermatitis, unspecified cause: Secondary | ICD-10-CM

## 2010-09-27 DIAGNOSIS — R030 Elevated blood-pressure reading, without diagnosis of hypertension: Secondary | ICD-10-CM

## 2010-10-04 ENCOUNTER — Telehealth (INDEPENDENT_AMBULATORY_CARE_PROVIDER_SITE_OTHER): Payer: Self-pay | Admitting: *Deleted

## 2010-10-05 ENCOUNTER — Encounter: Payer: Self-pay | Admitting: Emergency Medicine

## 2010-10-06 NOTE — Assessment & Plan Note (Signed)
Summary: Rash (rm 5)   Vital Signs:  Patient Profile:   55 Years Old Female CC:      rash to right arm and back of headx several weeks, concerned about electrolytes Height:     65 inches Weight:      191 pounds O2 Sat:      97 % O2 treatment:    Room Air Temp:     98.6 degrees F oral Pulse rate:   88 / minute Resp:     16 per minute BP sitting:   147 / 85  (left arm) Cuff size:   large  Vitals Entered By: Lajean Saver RN (September 27, 2010 8:56 AM)                  Updated Prior Medication List: CITALOPRAM HYDROBROMIDE 10 MG TABS (CITALOPRAM HYDROBROMIDE) Take 1 tablet by mouth once a day SYMBICORT 160-4.5 MCG/ACT AERO (BUDESONIDE-FORMOTEROL FUMARATE) 2 puffs twice a day ACTIVELLA 0.5-0.1 MG TABS (ESTRADIOL-NORETHINDRONE ACET) Take 1 tablet by mouth once a day CALCIUM CARBONATE-VITAMIN D 600-400 MG-UNIT  TABS (CALCIUM CARBONATE-VITAMIN D) Take 1 tablet by mouth two times a day GARLIC OIL 1000 MG CAPS (GARLIC) Take 1 capsule by mouth two times a day FISH OIL 1000 MG CAPS (OMEGA-3 FATTY ACIDS) Take 1 capsule by mouth once a day VITAMIN B COMPLEX-C   CAPS (B COMPLEX-C) Take 1 capsule by mouth once a day VITAMIN C 500 MG  TABS (ASCORBIC ACID) Take 1 tablet by mouth once a day FLONASE 50 MCG/ACT SUSP (FLUTICASONE PROPIONATE) 2 sprays both nostrils QAM  Current Allergies (reviewed today): ! * PENICILINHistory of Present Illness Chief Complaint: rash to right arm and back of headx several weeks, concerned about electrolytes History of Present Illness:  Subjective:  Patient complains of several month history of persistent pruritic rash on right elbow (antecubital fossa) and left posterior scalp.  Elbow somewhat worse past several days. She also notes that her BP has been mildly elevated recently.  She states that she has been drinking more water recently (no excessive thirst however) and concerned about her electrolytes.  REVIEW OF SYSTEMS Constitutional Symptoms      Denies  fever, chills, night sweats, weight loss, weight gain, and fatigue.  Eyes       Denies change in vision, eye pain, eye discharge, glasses, contact lenses, and eye surgery. Ear/Nose/Throat/Mouth       Denies hearing loss/aids, change in hearing, ear pain, ear discharge, dizziness, frequent runny nose, frequent nose bleeds, sinus problems, sore throat, hoarseness, and tooth pain or bleeding.  Respiratory       Denies dry cough, productive cough, wheezing, shortness of breath, asthma, bronchitis, and emphysema/COPD.  Cardiovascular       Denies murmurs, chest pain, and tires easily with exhertion.    Gastrointestinal       Denies stomach pain, nausea/vomiting, diarrhea, constipation, blood in bowel movements, and indigestion. Genitourniary       Denies painful urination, kidney stones, and loss of urinary control. Neurological       Denies paralysis, seizures, and fainting/blackouts. Musculoskeletal       Complains of redness and swelling.      Denies muscle pain, joint pain, joint stiffness, decreased range of motion, muscle weakness, and gout.  Skin       Denies bruising, unusual mles/lumps or sores, and hair/skin or nail changes.  Psych       Denies mood changes, temper/anger issues, anxiety/stress, speech problems, depression, and sleep  problems. Other Comments: Patient c/o rash to right arm and back left side of head. SHe also complains of "eating too much junk over the weekend and feel like my electrolytes are off". C/o fatigue   Past History:  Past Medical History: Reviewed history from 03/23/2010 and no changes required. heart palpitations, hx of MVP chest pain , atypical Anxiety IBS asthma  Past Surgical History: Reviewed history from 05/08/2006 and no changes required. eAB x 2  Family History: Reviewed history from 05/08/2006 and no changes required. brother ETOH, brother PTSD, father died at 69 of AMI, mother had breast ca at 21, died of lung ca, depression  Social  History: Reviewed history from 03/23/2010 and no changes required. Divorced.  Grown son lives in [place].  Nonsmoker. Alcohol use-no Drug use-no   Objective:  Appearance:  Patient appears healthy, stated age, and in no acute distress  Eyes:  Pupils are equal, round, and reactive to light and accomdation.  Extraocular movement is intact.  Conjunctivae are not inflamed.  Mouth:  No lesions  Neck:  Supple.  No adenopathy is present.  No thyromegaly is present  Heart:  Regular rate and rhythm without murmurs, rubs, or gallops.  Lungs:  Clear to auscultation.  Breath sounds are equal.  Heart:  Regular rate and rhythm without murmurs, rubs, or gallops.  Abdomen:  Nontender without masses or hepatosplenomegaly.  Bowel sounds are present.  No CVA or flank tenderness.  Extremities:  No edema.  Pedal pulses are full and equal.  Skin:  On the right antecubital fossa is an erythematous eczematous appearing region measuring about 6cm dia with rather well-defined margins.  On the left posterior neck at hairline are several eczematous appearing chronic lesions suggestive of seborrheic dermatitis. Assessment New Problems: DERMATITIS (ICD-692.9) ELEVATED BP READING WITHOUT DX HYPERTENSION (ICD-796.2)  LESION RIGHT ANTECUBITAL FOSSA MAY REPRESENT TINEA CORPORIS WITH BACTERIAL SUPERINFECTION, ALTHOUGH IS PROBABLY AN ECZEMATOUS LESION. SUSPECT SEBORRHEIC DERMATITIS POSTERIOR SCALP/NECK  Plan New Medications/Changes: KETOCONAZOLE 2 % CREA (KETOCONAZOLE) Apply thin layer to rash on elbow two times a day for 7 to 10 days  #30gm x 0, 09/27/2010, Donna Christen MD TRIAMCINOLONE ACETONIDE 0.1 % CREA (TRIAMCINOLONE ACETONIDE) Apply thin layer to affected area two times a day for 7 to 10 days  #45gm x 0, 09/27/2010, Donna Christen MD  New Orders: T-CMP with estimated GFR [[id number]] Est. Patient Level IV [13086] Planning Comments:   Begin antifungal cream and triamcinolone cream to right antecubital fossa.   Begin triamcinolone cream to occipital area.  Begin brief course of Keflex.  Follow-up with dermatologist if not improving 3 to 4 weeks. Recommend that she monitor BP regularly for about a month and record, then follow-up with PCP if BP remains consistently elevated.  Check CMP.  Discussed decreasing sodium intake, diet, exercise.  The patient and/or caregiver has been counseled thoroughly with regard to medications prescribed including dosage, schedule, interactions, rationale for use, and possible side effects and they verbalize understanding.  Diagnoses and expected course of recovery discussed and will return if not improved as expected or if the condition worsens. Patient and/or caregiver verbalized understanding.  Prescriptions: KETOCONAZOLE 2 % CREA (KETOCONAZOLE) Apply thin layer to rash on elbow two times a day for 7 to 10 days  #30gm x 0   Entered and Authorized by:   Donna Christen MD   Signed by:   Donna Christen MD on 09/27/2010   Method used:   Print then Give to Patient   RxID:   [id number] TRIAMCINOLONE ACETONIDE 0.1 % CREA (TRIAMCINOLONE ACETONIDE) Apply thin layer to affected area two times a day for 7 to 10 days  #45gm x 0   Entered and Authorized by:   Donna Christen MD   Signed by:   Donna Christen MD on 09/27/2010   Method used:   Print then Give to Patient   RxID:   [id number]   Orders Added: 1)  T-CMP with estimated GFR [[id number]] 2)  Est. Patient Level IV [84132]

## 2010-10-11 NOTE — Progress Notes (Signed)
  Phone Note Outgoing Call Call back at [employer] of Call: Patient's labs from Feb 29th are not back yet. Called [facility] who is faxing over the results and I will have them scanned in Initial call taken by: Lajean Saver RN,  October 04, 2010 3:56 PM

## 2010-10-11 NOTE — Letter (Signed)
Summary: External Correspondence  External Correspondence   Imported By: Dannette Barbara 10/05/2010 14:26:25  _____________________________________________________________________  External Attachment:    Type:   Image     Comment:   External Document

## 2010-10-27 ENCOUNTER — Telehealth: Payer: Self-pay | Admitting: Cardiology

## 2010-10-27 DIAGNOSIS — R0600 Dyspnea, unspecified: Secondary | ICD-10-CM

## 2010-10-27 DIAGNOSIS — F419 Anxiety disorder, unspecified: Secondary | ICD-10-CM

## 2010-10-27 MED ORDER — CITALOPRAM HYDROBROMIDE 10 MG PO TABS: 10.0000 mg | ORAL_TABLET | Freq: Every day | ORAL | Status: DC

## 2010-10-27 MED ORDER — BUDESONIDE-FORMOTEROL FUMARATE 160-4.5 MCG/ACT IN AERO: 2.0000 | INHALATION_SPRAY | Freq: Two times a day (BID) | RESPIRATORY_TRACT | Status: DC

## 2010-10-27 NOTE — Telephone Encounter (Signed)
Ok per D Matihis.  To fill just need  To switch over to [place] instead of [place].[facility] per [employer]

## 2010-12-13 ENCOUNTER — Telehealth: Payer: Self-pay | Admitting: Cardiovascular Disease

## 2010-12-13 NOTE — Telephone Encounter (Signed)
She needs to ask about him writing something to get her out of jury duty.  She states she cannot be out of work.  Will have to have doctors note. Patient wanted to wait to talk to [facility] not triage nurse. Dr. Eden Emms Knows her stress level.

## 2010-12-13 NOTE — Assessment & Plan Note (Signed)
[name], [name]                 ACCOUNT NO.:  [id number]   MEDICAL RECORD NO.:  [id number]          PATIENT TYPE:  POB   LOCATION:  [facility] [place]         FACILITY:  [facility]   PHYSICIAN:  Kristin Lincoln, MD      DATE OF BIRTH:  06-24-1956   DATE OF SERVICE:  06/29/2010                                  CLINIC NOTE   The patient is a 55 year old female who returns for her yearly exam.  The patient seems happier.  She is happy with her hormone replacement  therapy.  We again reviewed the risks of hormone replacement therapy.  She would like to stay on it.  She has gained weight since her last  visit, approximately 11 pounds.  She sees Dr. Tenny Craw and follows his  advice exclusively.  She is eating more healthy foods, but she eats a  lot more of them.  She does eat a lot of granola, which I think will be  adding to her weight gain.  She did stop soda, which is beneficial, that  amount she is replacing that with other foods.   PAST MEDICAL HISTORY:  Asthma, anxiety, depression.   PAST SURGICAL HISTORY:  None.   OBSTETRICAL HISTORY:  NSVD x1, termination x2.   GYNECOLOGIC HISTORY:  Cryo and LEEP in the 80s, but no abnormal Pap  smear since, but this has been over 20 years.  She has had 2 normal Pap  smears here, so we are now going to skip to every 2 years.  No history  of ovarian cyst, fibroid tumors, or sexually transmitted diseases, and  the patient is not sexually active currently.   FAMILY HISTORY:  No changes in family history.   SOCIAL HISTORY:  She is getting along better with her brother, which is  good news.   MEDICATIONS:  Activella, citalopram, Symbicort, vitamin C, calcium,  garlic, B complex, another herbal ALG medications that I am unsure what  it is for, and omega-3 fish oil.   REVIEW OF SYSTEMS:  Negative except for some back pain that occurred  over Thanksgiving and some mild constipation.  The patient will be  starting docusate sodium.   PHYSICAL  EXAMINATION:  VITAL SIGNS:  Blood pressure 131/69, weight 194,  height 64 inches, pulse 73.  GENERAL:  Well nourished, well developed, in no apparent stress.  HEENT:  Normocephalic, atraumatic.  There are some bumps in the top of  her scalp the patient is worried about, they are flesh-colored, they are  nonerythematous, they are not worrisome for malignancy.  I think they  might be associated with her hair dye.  I will refer her to a  dermatologist.  Dentition is good, but the patient needs a dental  checkup.  Thyroid no masses.  LUNGS:  Clear to auscultation bilaterally.  HEART:  Regular rhythm.  BREASTS:  No masses, nontender.  No lymphadenopathy.  No nipple  discharge.  Right breast is bigger than the left breast.  ABDOMEN:  Soft, nontender.  No organomegaly.  No hernia, obese.  GENITALIA:  Tanner V.  Vagina pink.  No atrophy and no cystocele.  Cervix closed.  Uterus and adnexa are nonpalpable  secondary to habitus  but nontender.  Rectovaginal reveals a slight rectocele.  The patient is  asymptomatic.  EXTREMITIES:  No edema overall.  SKIN:  The patient has maybe eczema versus psoriasis on her extensor  surfaces of her upper extremities.   ASSESSMENT AND PLAN:  A 55 year old female for well-woman exam.  1. No Pap smear needed today.  Bimanual did not reveal any problems.  2. The patient needs mammogram in January 2012.  3. The patient needs a colonoscopy, so she is referred.  4. The patient needs to go to dermatology to evaluate skin issues.  5. Continue hormonal replacement therapy.  Again, the patient is fully      aware of risks, benefits, and alternatives to hormone replacement      therapy.           ______________________________  Kristin Lincoln, MD     KL/MEDQ  D:  06/29/2010  T:  06/30/2010  Job:  [id number]

## 2010-12-13 NOTE — Assessment & Plan Note (Signed)
[name], [name]                 ACCOUNT NO.:  [id number]   MEDICAL RECORD NO.:  [id number]          PATIENT TYPE:  POB   LOCATION:  [facility] [place]         FACILITY:  [facility]   PHYSICIAN:  Elsie Lincoln, MD      DATE OF BIRTH:  22-Jun-1956   DATE OF SERVICE:  04/28/2009                                  CLINIC NOTE   The patient is a 55 year old menopausal female who presents for her  yearly exam.  She is currently on Activella, HRT and is still having  some hot flashes.  We increased her to 1/0.05, but it did not completely  take away her hot flashes.  She does not want to go back to her  decreased dose.  She still understands there is a increased risk of  breast cancer with this and also increased risk of DVT, but again she  would like to stay on this medication.  We did check her TSH last year  and it was normal.  She still has anxiety, which is managed by another  doctor.  The patient is undergoing some interesting difficult situation  with her son, but she is not going into details.  She still lives with  her dog and does work from home.  She is complaining of some rectal  irritation.  She used some Aloe Wipes for several months and developed a  rash.  She also is picking up some lesions on her sternum and this is  more out of anxiety.  The patient is due for a mammogram.  The patient  did have sex with an old boyfriend, so we will send gonorrhea and  Chlamydia cultures today.  This was 6 months ago.  The patient does not  desire any other testing at this time.   PAST MEDICAL HISTORY:  Asthma, anxiety, depression.   PAST SURGICAL HISTORY:  None.   OBSTETRICAL HISTORY:  NSVD x1, termination x2.   CONTRACEPTIVE HISTORY:  She has used condoms in the past, but has not  used in this last sexual intercourse.   MENSTRUAL HISTORY:  She is menopausal with no postmenopausal bleeding.   GYNECOLOGIC HISTORY:  She did have an abnormal Pap smear in the 80s.  Her last Pap smear here  was normal with presence of the transformation  zone.  No history of ovarian cyst, fibroid tumors or sexually  transmitted diseases.   FAMILY HISTORY:  No change in her family history.  Her mother did have  premenopausal breast cancer, but is already passed away.  The patient  has no insurance and cannot afford to be tested for BRCA-I and II  testing.   SOCIAL HISTORY:  As above.   MEDICATIONS:  Activella, citalopram, Symbicort, vitamin C, calcium,  garlic, B complex, some other oral medications, and omega-3 fish oil.   ALLERGIES:  PENICILLIN.   REVIEW OF SYSTEMS:  Positive for itching of her rectum and some  irritation in her sternum.   PHYSICAL EXAMINATION:  VITAL SIGNS:  Pulse 72, blood pressure 129/77,  weight 179, height 64 inches.  GENERAL:  Well nourished, well developed, no apparent distress.  HEENT:  Normocephalic, atraumatic.  Good dentition.  NECK:  Thyroid, no masses.  LUNGS:  Clear to auscultation bilaterally.  HEART:  Regular rate and rhythm.  CHEST:  Small bumps that did not appear cancerous that the patient has  irritated with constant scratching.  BREASTS:  No masses.  No nipple discharge.  No lymphadenopathy.  No skin  changes.  ABDOMEN:  Soft, no organomegaly, no hernia.  No rebound or guarding.  PELVIC:  Genitalia, Tanner V.  Vagina pink, normal rugae.  Cervix;  closed, nontender.  Uterus; anteverted, nontender.  Adnexa; no masses,  nontender.  No cystocele.  No rectocele.  RECTAL:  Rectum has hemorrhoids.  On the left buttock, there are some  small lesions that appear to be excoriated areas.  No evidence of  malignancy.  No evidence of herpes.  EXTREMITIES:  No edema.   ASSESSMENT AND PLAN:  A 56 year old female for well-woman exam.  1. Pap smear with GC and chlamydia.  2. Mammogram with mammogram scholarship.  3. Triamcinolone cream to sternum bone and rectal area twice a day for      a week and once a day for a week.  4. Return to clinic in 2 weeks  to see how irritating areas are      progressing.           ______________________________  Elsie Lincoln, MD     KL/MEDQ  D:  04/28/2009  T:  04/29/2009  Job:  [id number]
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
## 2010-12-13 NOTE — Assessment & Plan Note (Signed)
NAMESHANTE, ARCHAMBEAULT                 ACCOUNT NO.:  [id number]   MEDICAL RECORD NO.:  [id number]          PATIENT TYPE:  POB   LOCATION:  [facility] [place]         FACILITY:  [facility]   PHYSICIAN:  Elsie Lincoln, MD      DATE OF BIRTH:  Feb 01, 1956   DATE OF SERVICE:  01/19/2010                                  CLINIC NOTE   The patient is a 55 year old female who presents to discuss hormone  meds.  The patient is extremely irritable today.  She seems very angry  at all of her relatives and friends.  She says they are all useless and  do not spend time with her.  She cried several times.  She says  she  invested her whole life with her dog.  She does state she has no  suicidal ideation.  She wants her hormone meds to be adjusted.  She also  needs to see Behavioral Health immediately.  She agrees that she wants  to go, we talked for about 20 minutes and I agree she is not suicidal,  but she would benefit from some counseling.  Once we got a hold of  Behavioral Health, we found out that she was a no-show at 3 visits and  they will not see her anymore.  At this point, I do not know where to  send her, but my nurse Vernona Rieger will work on where she can go.  We will  call the patient and tell her that she has pretty much exhausted every  place in town that will see her, so I do not really know what to do at  this point.  She was given suicidal ideation risks and warnings and to  come to the ER immediately if she ever feels suicidal, but I definitely  think she needs a behavioral intervention.           ______________________________  Elsie Lincoln, MD     KL/MEDQ  D:  01/19/2010  T:  01/20/2010  Job:  [id number]

## 2010-12-13 NOTE — Cardiovascular Report (Signed)
[facility]                                ECHOCARDIOGRAM   [name], [name]                        MRN:          [id number]  DATE:12/11/2007                            DOB:          02-01-1956    CLINICAL DATA:  A 55 year old woman with chest pain and multiple  cardiovascular risk factors.   1. Treadmill exercise performed to a workload of 7 METs and a heart      rate of 166, 99% of age-predicted maximum.  Exercise discontinued      due to dyspnea; no chest pain reported.  2. Blood pressure increased from a resting value of 120/75 to 175/70      during exercise and 180/50 in recovery, a normal response.  3. No arrhythmias noted.  4. EKG:  Normal sinus rhythm; prominent but nondiagnostic inferior Q-      waves; otherwise unremarkable.  5. Stress EKG:  1.5-2 mm of upsloping ST segment depression in the      inferior leads and 1 mm of upsloping ST segment depression in leads      V3-V6; resolved within the first minute of recovery.  6. Baseline echocardiogram:  Normal left atrial and left ventricular      size; normal aortic and mitral valve; vigorous myocardial      contractility.  7. Post-exercise echocardiogram:  Increased contractility in all      segments.   IMPRESSION:  Negative stress echocardiogram revealing impaired exercise  capacity, a borderline electrocardiographic response to exertion, but no  echocardiographic evidence for ischemia or infarction.  Other findings  as noted.     Gerrit Friends. Dietrich Pates, MD, [facility]  Electronically Signed    RMR/MedQ  DD: 12/12/2007  DT: 12/12/2007  Job #: [id number]

## 2010-12-13 NOTE — Assessment & Plan Note (Signed)
[facility]                            CARDIOLOGY OFFICE NOTE   Delynda, Sepulveda [name]                        MRN:          [id number]  DATE:04/19/2007                            DOB:          09-10-1955    [name] returns today for followup.  I have seen her in the past.  She has  had significant anxiety with palpitations, shortness of breath, and  chest pain.  Apparently, she was just discharged from the practice per  Dr. Jonny Ruiz.  There is a letter dated March 08, 2007.  I talked to Karlisha  about this.  She drove from [place] to get some free samples of  Veramyst and was unhappy with the coupon.  However, in talking to Annalei,  she clearly has issues with emotional outbursts.  She apparently has  seen doctors in [place] and was discharged from their practice as  well.  I had a long discussion with Jazlyn in regard to her symptoms.  She has had atypical chest pain.  It is central.  It is sharp.  It  radiates to both shoulders.  It has been going on for quite some time  and is essentially chronic.  It has not been progressive.  The pain is  not exertional.  It can be exacerbated by motion.  There is no pleuritic  component.  She also has chronic exertional dyspnea.  This, again, tends  to happen when she gets anxious, particularly when she was in an  environment like she was upset about her recent medical care.  Her  palpitations are also benign-sounding.  They are flip-flops.  There is  no rapid high palpitations.  There is no syncope.  The shortness of  breath and pain can come along with the palpitations.  They all seem to  be related to possible panic attacks and her emotional outbursts.   Her review of systems is otherwise negative.   CURRENT MEDICATIONS:  Omega vitamins.  Vitamin C.  Symbicort.  Activella  for hot flashes.   EXAM:  Remarkable for an anxious white female in no distress.  I congratulated her as she has lost weight from 187 to  169, blood  pressure 122/68, pulse 66 and regular.  She is afebrile.  Respiratory  rate is 14.  HEENT:  Normal.  Carotids normal without bruits.  There is no lymphadenopathy.  No  thyromegaly.  No JVP elevation.  LUNGS:  Clear.  Good diaphragmatic motion.  No wheezing.  There is an S1, S2 with normal heart sounds.  PMI is normal.  ABDOMEN:  Benign.  Bowel sounds positive.  No AAA.  No  hepatosplenomegaly.  No hepatojugular reflux.  Femorals are +3 bilaterally.  There is no bruit.  PT +2.  There is no  lower extremity edema.  NEURO:  Nonfocal.  There is no lymphadenopathy.  SKIN:  Warm and dry.  There is no muscular weakness.   Her EKG is normal.   IMPRESSION:  1. Palpitations, benign.  Follow up with primary care doctor regarding      TSH and T4, likely  related to anxiety.  2. Dyspnea, functional.  Also related to panic attacks and anxiety.      No need for further workup.  3. Chest pain.  This is a recurrent problem with the patient.  She has      not had any testing since 1999.  She apparently has some sort of      insurance through [facility] where her testing gets done even though she      is uninsured.  I told her the best test for her in regard to her      palpitations, chest pain, and dyspnea, would be a stress echo.  I      think this would be paid for through her program.  There is no      radiation involved.  She will be referred for a stress      echocardiogram.  If this is normal, she would be seen in a year.  4. Anxiety, depression, and emotional outbursts.  This has clearly      been a problem for this patient.  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  I      explained to her that she needs to take a deep breath and not say      anything for 10 minutes when she is confronted with a situation      that upsets her.  She will try to find a primary care physician.  I      suspect she would benefit from Zoloft or an  SSRI.  I will see her      back in a year so long as her stress echo is normal.     Theron Arista C. Eden Emms, MD, [facility]  Electronically Signed    PCN/MedQ  DD: 04/19/2007  DT: 04/19/2007  Job #: [id number]
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
## 2010-12-14 NOTE — Telephone Encounter (Signed)
Pt rtn your call but she will not be able to answer until this afternoon if you don't call in the next because she has a sick dog so it will be better if you call this afternoon

## 2010-12-15 NOTE — Telephone Encounter (Signed)
Left message for pt will need juror number and location she is to go before we can fill out the letter. [name]

## 2010-12-16 NOTE — Letter (Signed)
March 08, 2007    Ms. Lindalou Hose. Soderquist  [address]  [address]  [place], [place] [address]   RE:  JOLEIGH, MINEAU  MRN:  [id number]  /  DOB:  Jan 22, 1956   Dear Ms. Hollopeter:   This letter is forwarded to you because we find it necessary to inform  you that we will no longer be able to provide medical care to you due to  recent behavior in our waiting room that was inappropriate.   Since your condition required continued medical attention, we suggest  that you place yourself under the care of another physician without  delay.  If you desire, we will be available for emergency care for a  reasonable period of time after you receive this letter but in no event  later than 30 days.   This should give you ample time to select a physician of your choice  from the many competent providers in this area.  You may want to call  the local medical society or [facility] Physician Referral  Service for their assistance in locating a new physician.  With your  written authorization, we will make a copy of your medical records  available to your new physician.    Sincerely,      Corwin Levins, MD  Electronically Signed    JWJ/MedQ  DD: 03/08/2007  DT: 03/08/2007  Job #: [id number]

## 2010-12-16 NOTE — Assessment & Plan Note (Signed)
[facility]                              CARDIOLOGY OFFICE NOTE   TAMY, ACCARDO                        MRN:          [id number]  DATE:06/15/2006                            DOB:          March 12, 1956    Mrs. Cowing is seen today as a new consult.  She was referred by Dr. Jonny Ruiz.  The patient was seen by me initially in 1999 for atypical chest pain.  A the  time, she had a normal stress test and a normal echo.  She tends to be a  fairly anxious woman.   She has had a lot of problems with weight-gain and hormonal issues with  menopause.   She occasional gets exertional dyspnea.  This sounds more like  deconditioning.  She does not have a diagnosis of asthma, but has had some  bronchial problems for which she takes Advair.   The patient has had some atypical chest pain at night, when she rolls over.  She occasionally gets pain at rest.  There is no classic exertional angina.  Again, the patient appears somewhat anxious and seems to be a little bit  depressed about the weight-gain and the need for hormonal therapy.   REVIEW OF SYSTEMS:  Remarkable for occasional palpitations.  She gets  shortness of breath with exertion.   ALLERGIES:  She is allergic to PENICILLIN.   SOCIAL HISTORY:  She quit smoking in 1983.  She drinks an occasional  caffeinated beverage.  Her last total cholesterol was 305, but I do not have  a break-down of the particle sizes.  She walks three to five days a week,  about a mile, but not at a very fast pace.  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  She is divorced.  She has two previous  abortions and has not had any other surgery, outside of childbirth.   REVIEW OF SYSTEMS:  Otherwise remarkable for anxiety, depression and  fatigue.   FAMILY HISTORY:  Remarkable for mother dying of lung cancer at age 41,  father having heart attack at age 57.   CURRENT MEDICATIONS:  1. She is on Activella,  which is an estrogen/progesterone combination.  2. Advair 100/50 Diskus.  3. Multivitamins.   PHYSICAL EXAMINATION:  HEENT:  Normal.  NECK:  There is no lymphadenopathy, no thyromegaly, no carotid bruits.  LUNGS:  Clear.  HEART:  There is an S1, S2 with normal heart sounds.  ABDOMEN:  Benign.  LOWER EXTREMITIES:  Intact pulses, no edema.  SKIN:  Warm and dry.  NEUROLOGIC:  Nonfocal.   Her EKG is normal.   IMPRESSION:  Atypical chest pain, exertional dyspnea, related to  deconditioning.  No evidence of significant structural heart disease.   PLAN:  The patient will continue her estrogen replacement for symptoms of  menopause.  She can take a baby aspirin a day.  There is no indication for  further echo or stress testing at this point.   I tried to reassure her about her heart.  She can have her lipids  followed  up by her primary care doctor, but again, this would involve primary  prevention with minimal risk factors.   I will see her on a p.r.n. basis.     [name]. Eden Emms, MD, [facility]  Electronically Signed    PCN/MedQ  DD: 06/15/2006  DT: 06/15/2006  Job #: [id number]   cc:   Corwin Levins, MD

## 2010-12-16 NOTE — Assessment & Plan Note (Signed)
[facility]                                 ON-CALL NOTE   [name], [name]                        MRN:          [id number]  DATE:10/19/2006                            DOB:          Sep 24, 1955    The patient is calling about a possible medication reaction.  She has  very mild asthma, but usually does not require medication.  She using  Advair only intermittently and almost never uses Albuterol.  Over the  past several days she has been mildly short of breath and has been using  them on a regular basis.  She has used Advair twice daily and Albuterol  2 or 3 times a day.  The last time she used an Albuterol inhaler was  about 2 hours ago, now she feels jittery, nervous, shaky and her heart  is racing.  She denies any chest pains or other symptoms.  My response  is to reassure her that this is a normal side effect of Albuterol,  especially if you are not used to it.  I think it will pass over the  next couple of hours and she should do fine.  If she continues to have  problems she can call me back, otherwise she can see the office this  week.     Tera Mater. Clent Ridges, MD  Electronically Signed    SAF/MedQ  DD: 10/22/2006  DT: 10/22/2006  Job #: [id number]

## 2011-01-18 ENCOUNTER — Encounter: Payer: Self-pay | Admitting: Cardiovascular Disease

## 2011-01-19 ENCOUNTER — Encounter: Payer: Self-pay | Admitting: Cardiovascular Disease

## 2011-01-19 ENCOUNTER — Ambulatory Visit (INDEPENDENT_AMBULATORY_CARE_PROVIDER_SITE_OTHER): Payer: Self-pay | Admitting: Cardiovascular Disease

## 2011-01-19 DIAGNOSIS — Z9189 Other specified personal risk factors, not elsewhere classified: Secondary | ICD-10-CM

## 2011-01-19 DIAGNOSIS — R002 Palpitations: Secondary | ICD-10-CM

## 2011-01-19 DIAGNOSIS — R0602 Shortness of breath: Secondary | ICD-10-CM

## 2011-01-19 NOTE — Assessment & Plan Note (Signed)
Normal Stress echo in 2010  Resolved Normal ECG today

## 2011-01-19 NOTE — Progress Notes (Signed)
[name] has been seen in past for atypical SSCP normal stress echo in 2010.  Palpitatoins related to stress and caffeine.  Dyspnea that is functional.  Still struggling with anxiety.  Apparantly canceled appt with behavioral health in [place].  No real SSCP, dyspnea and palpitations only related to stress. Wanted note for jury duty but I told her I couldn't excuse her because we have never documented a heart problem.  ECG totally normal today.  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  Discussed exercise program.  Will try to rerefer to behavioral health.  Sees [facility] for primary needs  ROS: Denies fever, malais, weight loss, blurry vision, decreased visual acuity, cough, sputum, SOB, hemoptysis, pleuritic pain, palpitaitons, heartburn, abdominal pain, melena, lower extremity edema, claudication, or rash.  All other systems reviewed and negative  General: Affect appropriate Healthy:  appears stated age HEENT: normal Neck supple with no adenopathy JVP normal no bruits no thyromegaly Lungs clear with no wheezing and good diaphragmatic motion Heart:  S1/S2 no murmur,rub, gallop or click PMI normal Abdomen: benighn, BS positve, no tenderness, no AAA no bruit.  No HSM or HJR Distal pulses intact with no bruits No edema Neuro non-focal Skin warm and dry No muscular weakness   Current Outpatient Prescriptions  Medication Sig Dispense Refill  . B Complex-C (B-COMPLEX WITH VITAMIN C) tablet Take 1 tablet by mouth daily.        . budesonide-formoterol (SYMBICORT) 160-4.5 MCG/ACT inhaler Inhale 2 puffs into the lungs 2 (two) times daily.  1 Inhaler  2  . Calcium Carbonate-Vitamin D 600-400 MG-UNIT per tablet Take 1 tablet by mouth 2 (two) times daily.        . citalopram (CELEXA) 10 MG tablet Take 1 tablet (10 mg total) by mouth daily.  30 tablet  2  . Estradiol-Norethindrone Acet (ACTIVELLA) 0.5-0.1 MG per tablet Take 1 tablet by mouth daily.        . Garlic Oil 1000 MG  CAPS 1 capsule 2 (two) times daily.        . Omega-3 Fatty Acids (FISH OIL) 1000 MG CAPS Take 1 capsule by mouth daily.        . vitamin C (ASCORBIC ACID) 500 MG tablet Take 500 mg by mouth daily.        Marland Kitchen DISCONTD: fluticasone (FLONASE) 50 MCG/ACT nasal spray Place 2 sprays into the nose every morning.          Allergies  Penicillins  Electrocardiogram:  NSR 67 normal ECG  Assessment and Plan

## 2011-01-19 NOTE — Assessment & Plan Note (Signed)
Benign, related to stress and caffeine  No need for w/u

## 2011-01-19 NOTE — Assessment & Plan Note (Signed)
Functional.  Normal ECG and exam

## 2011-01-25 ENCOUNTER — Telehealth: Payer: Self-pay | Admitting: *Deleted

## 2011-01-25 NOTE — Telephone Encounter (Signed)
Called pt to let her know that Behavioral Health would not accept her as a patient b/c she has no showed there 3 times she is aware and ok with this.  She states that she is in the Symbicort assistance program with [facility] and needs a refill, have called them at [phone] and ordered med it will ship to pt's home in 7-10 days order # [id number]

## 2011-02-15 ENCOUNTER — Other Ambulatory Visit: Payer: Self-pay | Admitting: Cardiovascular Disease

## 2011-04-04 ENCOUNTER — Encounter: Payer: Self-pay | Admitting: Emergency Medicine

## 2011-04-04 ENCOUNTER — Inpatient Hospital Stay (INDEPENDENT_AMBULATORY_CARE_PROVIDER_SITE_OTHER)
Admission: RE | Admit: 2011-04-04 | Discharge: 2011-04-04 | Disposition: A | Discharge status: Home / Self Care | Payer: Self-pay | Source: Ambulatory Visit | Attending: Emergency Medicine | Admitting: Emergency Medicine

## 2011-04-04 DIAGNOSIS — J309 Allergic rhinitis, unspecified: Secondary | ICD-10-CM

## 2011-04-20 LAB — CULTURE, ROUTINE-ABSCESS

## 2011-04-21 ENCOUNTER — Other Ambulatory Visit: Payer: Self-pay | Admitting: Cardiovascular Disease

## 2011-04-21 DIAGNOSIS — R0600 Dyspnea, unspecified: Secondary | ICD-10-CM

## 2011-04-21 MED ORDER — BUDESONIDE-FORMOTEROL FUMARATE 160-4.5 MCG/ACT IN AERO: 2.0000 | INHALATION_SPRAY | Freq: Two times a day (BID) | RESPIRATORY_TRACT | Status: DC

## 2011-04-21 NOTE — Telephone Encounter (Signed)
Pt calling stating that she needs refill of symbicort. Fax request into [facility] [facility] fax: [phone] phone [phone].

## 2011-04-24 ENCOUNTER — Telehealth: Payer: Self-pay | Admitting: Cardiovascular Disease

## 2011-04-24 NOTE — Telephone Encounter (Signed)
Pt said symbicort is sent to [facility]

## 2011-04-24 NOTE — Telephone Encounter (Signed)
Spoke with pt, new script sent to [facility] at [phone]. [name]

## 2011-04-29 ENCOUNTER — Encounter: Payer: Self-pay | Admitting: Family Medicine

## 2011-04-29 ENCOUNTER — Telehealth (INDEPENDENT_AMBULATORY_CARE_PROVIDER_SITE_OTHER): Payer: Self-pay | Admitting: Emergency Medicine

## 2011-05-04 ENCOUNTER — Telehealth: Payer: Self-pay | Admitting: Cardiovascular Disease

## 2011-05-04 NOTE — Telephone Encounter (Signed)
SHANE WITH [employer] IS CALLING FOR DIRECTIONS ON SYMBICORT FOR PATIENT

## 2011-05-09 ENCOUNTER — Telehealth: Payer: Self-pay | Admitting: Cardiovascular Disease

## 2011-05-09 NOTE — Telephone Encounter (Signed)
Pt says she has called twice about pt assistance for  symbicort please call

## 2011-05-09 NOTE — Telephone Encounter (Signed)
Left message for pt, called [facility] 04-24-11 and gave them the script [name]

## 2011-05-10 NOTE — Telephone Encounter (Signed)
Pt called and stated she felt much better taking Symbicort. She needs this sent to [facility] with dosage instructions.  She does two puffs in am and two in pm.  If any more info is needed, please call.

## 2011-05-11 MED ORDER — BUDESONIDE-FORMOTEROL FUMARATE 160-4.5 MCG/ACT IN AERO: 2.0000 | INHALATION_SPRAY | Freq: Two times a day (BID) | RESPIRATORY_TRACT | Status: DC

## 2011-05-11 NOTE — Telephone Encounter (Signed)
Script faxed to number provided [name]

## 2011-05-18 ENCOUNTER — Telehealth: Payer: Self-pay | Admitting: Cardiovascular Disease

## 2011-05-18 NOTE — Telephone Encounter (Signed)
Pharmacy needs clarification of sumbicort rx please call

## 2011-05-18 NOTE — Telephone Encounter (Signed)
Called pharmacy they state they did not leave a message.

## 2011-05-29 ENCOUNTER — Telehealth: Payer: Self-pay | Admitting: Cardiovascular Disease

## 2011-05-29 DIAGNOSIS — R0600 Dyspnea, unspecified: Secondary | ICD-10-CM

## 2011-05-29 MED ORDER — BUDESONIDE-FORMOTEROL FUMARATE 160-4.5 MCG/ACT IN AERO: 2.0000 | INHALATION_SPRAY | Freq: Two times a day (BID) | RESPIRATORY_TRACT | Status: DC

## 2011-05-29 NOTE — Telephone Encounter (Addendum)
Pt called stating Symbicort has not been called into [facility] for Symbicort.  There has been confusion where this is sent to.  This if for the free  Medication thru the company.  They need new pres. And the fax number is [phone].

## 2011-05-29 NOTE — Telephone Encounter (Signed)
Spoke with [facility], they were missing something on the last script sent to them. They reported they called and we did not respond. According to pt chart when we returned their call they had no question. Script refaxed to [phone]. Pt made aware [name]

## 2011-05-30 ENCOUNTER — Telehealth: Payer: Self-pay | Admitting: Cardiovascular Disease

## 2011-05-30 MED ORDER — CITALOPRAM HYDROBROMIDE 10 MG PO TABS: 10.0000 mg | ORAL_TABLET | Freq: Every day | ORAL | Status: DC

## 2011-05-30 NOTE — Telephone Encounter (Signed)
Pt needs cytalopram hbr 10mg  qd and she needs a 90 day supply [facility] [address] [phone]

## 2011-06-10 ENCOUNTER — Encounter: Payer: Self-pay | Admitting: *Deleted

## 2011-06-10 ENCOUNTER — Telehealth: Payer: Self-pay | Admitting: *Deleted

## 2011-06-10 ENCOUNTER — Emergency Department (INDEPENDENT_AMBULATORY_CARE_PROVIDER_SITE_OTHER)
Admission: EM | Admit: 2011-06-10 | Discharge: 2011-06-10 | Disposition: A | Discharge status: Home / Self Care | Payer: Self-pay | Source: Home / Self Care | Attending: Family Medicine | Admitting: Family Medicine

## 2011-06-10 DIAGNOSIS — H6592 Unspecified nonsuppurative otitis media, left ear: Secondary | ICD-10-CM

## 2011-06-10 DIAGNOSIS — J069 Acute upper respiratory infection, unspecified: Secondary | ICD-10-CM

## 2011-06-10 DIAGNOSIS — H6590 Unspecified nonsuppurative otitis media, unspecified ear: Secondary | ICD-10-CM

## 2011-06-10 DIAGNOSIS — J0100 Acute maxillary sinusitis, unspecified: Secondary | ICD-10-CM

## 2011-06-10 MED ORDER — BENZONATATE 200 MG PO CAPS: 200.0000 mg | ORAL_CAPSULE | Freq: Every day | ORAL | Status: AC

## 2011-06-10 MED ORDER — CEPHALEXIN 500 MG PO CAPS: 500.0000 mg | ORAL_CAPSULE | Freq: Four times a day (QID) | ORAL | Status: AC

## 2011-06-10 MED ORDER — CEFDINIR 300 MG PO CAPS: 300.0000 mg | ORAL_CAPSULE | Freq: Two times a day (BID) | ORAL | Status: DC

## 2011-06-10 NOTE — ED Notes (Signed)
Pt c/o HA, teeth hurt, nasal congestion, LT ear congestion and productive cough x 4 days. ? Fever. She has taken Garlic and Advil.

## 2011-06-10 NOTE — ED Provider Notes (Addendum)
History     CSN: [id number] Arrival date & time: 06/10/2011  9:43 AM   First MD Initiated Contact with Patient 06/10/11 [phone]      Chief Complaint  Patient presents with  . Nasal Congestion  . Cough     HPI Comments: Patient complains of URI symptoms for 4 days.  Yesterday developed cough.  She has a history of asthma and uses Symbicort and albuterol MDI  Patient is a 55 y.o. female presenting with URI. The history is provided by the patient.  URI The primary symptoms include fever, fatigue, cough and myalgias. Primary symptoms do not include ear pain, sore throat, wheezing, abdominal pain, nausea, vomiting or rash. The current episode started 3 to 5 days ago. This is a new problem. The problem has been gradually worsening.  Symptoms associated with the illness include chills, sinus pressure, congestion and rhinorrhea. The illness is not associated with plugged ear sensation.    Past Medical History  Diagnosis Date  . Heart palpitations     hx of MVP  . Chest pain, atypical   . Anxiety   . IBS (irritable bowel syndrome)   . Asthma     Past Surgical History  Procedure Date  . Eab     x2     Family History  Problem Relation Age of Onset  . Heart attack Father   . Lung cancer Mother     History  Substance Use Topics  . Smoking status: Never Smoker   . Smokeless tobacco: Not on file   Comment: nonsmoker  . Alcohol Use: No    OB History    Grav Para Term Preterm Abortions TAB SAB Ect Mult Living                  Review of Systems  Constitutional: Positive for fever, chills and fatigue.  HENT: Positive for congestion, rhinorrhea and sinus pressure. Negative for ear pain and sore throat.   Eyes: Negative.   Respiratory: Positive for cough. Negative for wheezing.   Cardiovascular: Negative.   Gastrointestinal: Negative.  Negative for nausea, vomiting and abdominal pain.  Genitourinary: Negative.   Musculoskeletal: Positive for myalgias.  Skin: Negative.   Negative for rash.  Neurological: Negative.     Allergies  Penicillins  Home Medications   Current Outpatient Rx  Name Route Sig Dispense Refill  . B COMPLEX-C PO TABS Oral Take 1 tablet by mouth daily.      . BUDESONIDE-FORMOTEROL FUMARATE 160-4.5 MCG/ACT IN AERO Inhalation Inhale 2 puffs into the lungs 2 (two) times daily. 1 Inhaler 12  . CALCIUM CARBONATE-VITAMIN D 600-400 MG-UNIT PO TABS Oral Take 1 tablet by mouth 2 (two) times daily.      Marland Kitchen CITALOPRAM HYDROBROMIDE 10 MG PO TABS Oral Take 1 tablet (10 mg total) by mouth daily. 90 tablet 3  . ESTRADIOL-NORETHINDRONE ACET 0.5-0.1 MG PO TABS Oral Take 1 tablet by mouth daily.      Marland Kitchen GARLIC OIL 1000 MG PO CAPS  1 capsule 2 (two) times daily.      Marland Kitchen FISH OIL 1000 MG PO CAPS Oral Take 1 capsule by mouth daily.      Marland Kitchen VITAMIN C 500 MG PO TABS Oral Take 500 mg by mouth daily.        BP 129/72  Pulse 78  Temp(Src) 98.3 F (36.8 C) (Oral)  Resp 18  Ht 5' 4.5" (1.638 m)  Wt 202 lb 8 oz (91.853 kg)  BMI 34.22 kg/m2  SpO2 97%  Physical Exam  Constitutional: She is oriented to person, place, and time. She appears well-developed and well-nourished. No distress.  HENT:  Head: Normocephalic.  Right Ear: External ear and ear canal normal.  Left Ear: Ear canal normal. A middle ear effusion is present.  Nose: Mucosal edema, rhinorrhea and sinus tenderness present.  Mouth/Throat: Oropharynx is clear and moist. No oropharyngeal exudate.       Tender maxillary sinuses  Eyes: Conjunctivae and EOM are normal. Pupils are equal, round, and reactive to light. Right eye exhibits no discharge. Left eye exhibits no discharge.  Neck: Normal range of motion. Neck supple.  Cardiovascular: Normal rate, regular rhythm and normal heart sounds.   Pulmonary/Chest: Effort normal and breath sounds normal. No respiratory distress.       Distinct tenderness to palpation over sternum  Abdominal: Soft. Bowel sounds are normal. There is no tenderness.    Musculoskeletal: She exhibits no edema.  Lymphadenopathy:    She has cervical adenopathy.       Right cervical: Posterior cervical adenopathy present.       Left cervical: Posterior cervical adenopathy present.  Neurological: She is alert and oriented to person, place, and time.  Skin: Skin is warm and dry. She is not diaphoretic.    ED Course  Procedures  None     Viral URI with left serous otitis and maxillary sinusitis    MDM  Begin Keflex.  Begin expectorant/decongestant, topical decongestant, saline irrigation,  cough suppressant at bedtime.  Increase fluid intake, rest. Followup with PCP if not improving.        Donna Christen, MD 06/12/11 2157  Donna Christen, MD 06/12/11 [phone]

## 2011-06-17 ENCOUNTER — Telehealth: Payer: Self-pay

## 2011-06-17 NOTE — ED Notes (Signed)
RETURN CALL TO PT, INFORMED HER THAT WE WERE CALLING NEW MEDICATION INTO [facility]

## 2011-06-20 ENCOUNTER — Telehealth: Payer: Self-pay | Admitting: Emergency Medicine

## 2011-07-03 NOTE — Progress Notes (Signed)
Summary: breathing issues/wb rm 5   Vital Signs:  Patient Profile:   55 Years Old Female CC:      SOB, fatigue, and allergies x 1day Height:     65 inches Weight:      201.50 pounds O2 Sat:      99 % O2 treatment:    Room Air Temp:     98.7 degrees F oral Pulse rate:   80 / minute Resp:     18 per minute BP sitting:   135 / 84  (left arm) Cuff size:   regular  Vitals Entered By: Clemens Catholic LPN (April 04, 2011 8:18 AM)                  Updated Prior Medication List: CITALOPRAM HYDROBROMIDE 10 MG TABS (CITALOPRAM HYDROBROMIDE) Take 1 tablet by mouth once a day SYMBICORT 160-4.5 MCG/ACT AERO (BUDESONIDE-FORMOTEROL FUMARATE) 2 puffs twice a day ACTIVELLA 0.5-0.1 MG TABS (ESTRADIOL-NORETHINDRONE ACET) Take 1 tablet by mouth once a day CALCIUM CARBONATE-VITAMIN D 600-400 MG-UNIT  TABS (CALCIUM CARBONATE-VITAMIN D) Take 1 tablet by mouth two times a day GARLIC OIL 1000 MG CAPS (GARLIC) Take 1 capsule by mouth two times a day FISH OIL 1000 MG CAPS (OMEGA-3 FATTY ACIDS) Take 1 capsule by mouth once a day VITAMIN B COMPLEX-C   CAPS (B COMPLEX-C) Take 1 capsule by mouth once a day VITAMIN C 500 MG  TABS (ASCORBIC ACID) Take 1 tablet by mouth once a day FLONASE 50 MCG/ACT SUSP (FLUTICASONE PROPIONATE) 2 sprays both nostrils QAM  Current Allergies (reviewed today): ! * PENICILINHistory of Present Illness Chief Complaint: SOB, fatigue, and allergies x 1day History of Present Illness: She has been having SOB, fatigue for 1 day.  No CP.  Also with some anxiety.  She attributes it to buying and sleeping with an air purifier that produces ozone.  She also has baseline allergies and takes Symbicort and Claritin.  These symptoms (runny nose, eczema, dry cough) have been increasing this week as well.    REVIEW OF SYSTEMS Constitutional Symptoms      Denies fever, chills, night sweats, weight loss, weight gain, and fatigue.  Eyes       Denies change in vision, eye pain, eye discharge,  glasses, contact lenses, and eye surgery. Ear/Nose/Throat/Mouth       Complains of frequent runny nose and sinus problems.      Denies hearing loss/aids, change in hearing, ear pain, ear discharge, dizziness, frequent nose bleeds, sore throat, hoarseness, and tooth pain or bleeding.  Respiratory       Complains of shortness of breath.      Denies dry cough, productive cough, wheezing, asthma, bronchitis, and emphysema/COPD.  Cardiovascular       Denies murmurs, chest pain, and tires easily with exhertion.    Gastrointestinal       Denies stomach pain, nausea/vomiting, diarrhea, constipation, blood in bowel movements, and indigestion. Genitourniary       Denies painful urination, kidney stones, and loss of urinary control. Neurological       Denies paralysis, seizures, and fainting/blackouts. Musculoskeletal       Denies muscle pain, joint pain, joint stiffness, decreased range of motion, redness, swelling, muscle weakness, and gout.  Skin       Denies bruising, unusual mles/lumps or sores, and hair/skin or nail changes.  Psych       Complains of anxiety/stress.      Denies mood changes, temper/anger issues, speech problems, depression,  and sleep problems. Other Comments: pt c/o SOB, fatigue and chest congestion x this am after using a new air humidifer last night. she has a hx of allergies. she took generic claritin this AM.    Past History:  Past Medical History: Reviewed history from 03/23/2010 and no changes required. heart palpitations, hx of MVP chest pain , atypical Anxiety IBS asthma  Past Surgical History: Reviewed history from 05/08/2006 and no changes required. eAB x 2  Family History: Reviewed history from 05/08/2006 and no changes required. brother ETOH, brother PTSD, father died at 71 of AMI, mother had breast ca at 67, died of lung ca, depression  Social History: Reviewed history from 03/23/2010 and no changes required. Divorced.  Grown son lives in [place].    Nonsmoker. Alcohol use-no Drug use-no Physical Exam General appearance: well developed, well nourished, no acute distress Ears: normal, no lesions or deformities Nasal: mucosa pink, nonedematous, no septal deviation, turbinates normal Oral/Pharynx: tongue normal, posterior pharynx without erythema or exudate Thyroid: no nodules, masses, tenderness, or enlargement Chest/Lungs: no rales, wheezes, or rhonchi bilateral, breath sounds equal without effort Heart: regular rate and  rhythm, no murmur Extremities: normal extremities Neurological: grossly intact and non-focal MSE: oriented to time, place, and person Assessment New Problems: ALLERGIC RHINITIS (ICD-477.9)   Plan New Orders: Est. Patient Level II [16109] Planning Comments:   I feel her symptoms are most likely from allergic rhinitis. I guess it's possible that it's from acute ozone exposure if she is very susceptible.  She is going to return the machine today and get a HEPA filter instead. Continue her allergy meds.  If further problems, needs to f/u with her PCP.    The patient and/or caregiver has been counseled thoroughly with regard to medications prescribed including dosage, schedule, interactions, rationale for use, and possible side effects and they verbalize understanding.  Diagnoses and expected course of recovery discussed and will return if not improved as expected or if the condition worsens. Patient and/or caregiver verbalized understanding.   Orders Added: 1)  Est. Patient Level II [60454]

## 2011-07-03 NOTE — Telephone Encounter (Signed)
  Phone Note Call from Patient   Caller: Patient Reason for Call: Refill Medication Action Taken: Rx Called In Summary of Call: 04-29-11 Called to request emergency Albuterol inhaler, in no current distress but planning trip ; Dr.Beese willing to do so; [facility] @ [phone] Initial call taken by: Lavell Islam RN,  April 29, 2011 3:13 PM

## 2011-07-03 NOTE — Progress Notes (Signed)
Summary: Med Refill  Refill albuterol inhaler. Donna Christen MD  April 29, 2011 3:19 PM    Prescriptions: PROAIR HFA 108 (90 BASE) MCG/ACT AERS (ALBUTEROL SULFATE) Two inhalations q4-6hr as needed.  Max 12 puffs/day  #1 MDI x 0   Entered and Authorized by:   Donna Christen MD   Signed by:   Donna Christen MD on 04/29/2011   Method used:   Printed then [facility]ed [facility] ...       [facility]  [address] [phone]* (retail)       [address] [place], [place]  [address]       Ph: [phone] or [phone]       Fax: [phone]   RxID:   [id number]  Rx called in.

## 2011-07-19 ENCOUNTER — Ambulatory Visit: Payer: Self-pay | Admitting: Obstetrics & Gynecology

## 2011-08-02 ENCOUNTER — Ambulatory Visit: Payer: Self-pay | Admitting: Obstetrics & Gynecology

## 2011-08-16 ENCOUNTER — Ambulatory Visit (INDEPENDENT_AMBULATORY_CARE_PROVIDER_SITE_OTHER): Payer: Self-pay | Admitting: Obstetrics & Gynecology

## 2011-08-16 ENCOUNTER — Encounter: Payer: Self-pay | Admitting: Obstetrics & Gynecology

## 2011-08-16 VITALS — BP 151/80 | HR 80 | Temp 97.0°F | Ht 65.0 in | Wt 206.1 lb

## 2011-08-16 DIAGNOSIS — Z01419 Encounter for gynecological examination (general) (routine) without abnormal findings: Secondary | ICD-10-CM

## 2011-08-16 NOTE — Progress Notes (Signed)
  Subjective:    LAMYAH CREED is a 56 y.o. female who presents for an annual exam. The patient has no complaints today. The patient is sexually active. GYN screening history: last pap: was normal and last mammogram: was normal. The patient wears seatbelts: yes. The patient participates in regular exercise: no. Has the patient ever been transfused or tattooed?: not asked. The patient reports that there is not domestic violence in her life.   Menstrual History: OB History    Grav Para Term Preterm Abortions TAB SAB Ect Mult Living   3 1   2 2           No LMP recorded. Patient is postmenopausal.    The following portions of the patient's history were reviewed and updated as appropriate: allergies, current medications, past family history, past medical history, past social history, past surgical history and problem list.  Review of Systems Constitutional: negative Respiratory: negative Cardiovascular: negative Gastrointestinal: negative Genitourinary:positive for hot flashes when not on HRT Integument/breast: positive for bilateral breasts sore.  Needs to decrease cafeine again Behavioral/Psych: negative    Objective:    BP 151/80  Pulse 80  Temp(Src) 97 F (36.1 C) (Oral)  Ht 5\' 5"  (1.651 m)  Wt 206 lb 1.3 oz (93.477 kg)  BMI 34.29 kg/m2 General appearance: alert, cooperative and no distress Eyes: negative Throat: normal findings: lips normal without lesions Neck: supple, symmetrical, trachea midline and thyroid not enlarged, symmetric, no tenderness/mass/nodules Back: no tenderness to percussion or palpation, symmetric, no curvature. ROM normal. No CVA tenderness. Lungs: clear to auscultation bilaterally Breasts: normal appearance, no masses or tenderness, no masses, nipple changes or discharge, tender diffusely during exam Heart: regular rate and rhythm Abdomen: soft, non-tender; bowel sounds normal; no masses,  no organomegaly Pelvic: cervix normal in appearance, exam  obscured by obesity, external genitalia normal, no adnexal masses or tenderness, no bladder tenderness, no cervical motion tenderness, rectovaginal septum normal, uterus normal size, shape, and consistency and vagina normal without discharge Extremities: extremities normal, atraumatic, no cyanosis or edema Skin: Skin color, texture, turgor normal. No rashes or lesions.    Assessment:    Healthy female exam.    Plan:     All questions answered. Chlamydia specimen. Follow up in 1 year. GC specimen. Mammogram.   Pt on HRT and understands risks. Pt would like to continue. F/U with primary care for increased BP

## 2011-08-16 NOTE — Patient Instructions (Signed)
  Place postmenopausal annual exam patient instructions here.  

## 2011-08-23 ENCOUNTER — Other Ambulatory Visit: Payer: Self-pay | Admitting: Obstetrics & Gynecology

## 2011-08-23 DIAGNOSIS — Z1231 Encounter for screening mammogram for malignant neoplasm of breast: Secondary | ICD-10-CM

## 2011-10-26 ENCOUNTER — Ambulatory Visit: Payer: Self-pay | Admitting: Family Medicine

## 2011-10-31 ENCOUNTER — Ambulatory Visit: Payer: Self-pay | Admitting: Family Medicine

## 2011-11-10 ENCOUNTER — Encounter: Payer: Self-pay | Admitting: Gastroenterology

## 2011-11-17 ENCOUNTER — Emergency Department
Admission: EM | Admit: 2011-11-17 | Discharge: 2011-11-17 | Disposition: A | Discharge status: Home / Self Care | Payer: Self-pay | Source: Home / Self Care

## 2011-11-21 ENCOUNTER — Ambulatory Visit: Payer: Self-pay | Admitting: Family Medicine

## 2011-11-21 DIAGNOSIS — Z0289 Encounter for other administrative examinations: Secondary | ICD-10-CM

## 2011-11-22 ENCOUNTER — Ambulatory Visit (HOSPITAL_COMMUNITY): Payer: Self-pay | Admitting: Psychiatry

## 2011-12-05 ENCOUNTER — Encounter: Payer: Self-pay | Admitting: Family Medicine

## 2011-12-05 ENCOUNTER — Ambulatory Visit (INDEPENDENT_AMBULATORY_CARE_PROVIDER_SITE_OTHER): Payer: Self-pay | Admitting: Family Medicine

## 2011-12-05 VITALS — BP 144/78 | HR 73 | Ht 65.0 in | Wt 196.0 lb

## 2011-12-05 DIAGNOSIS — F4389 Other reactions to severe stress: Secondary | ICD-10-CM

## 2011-12-05 DIAGNOSIS — E785 Hyperlipidemia, unspecified: Secondary | ICD-10-CM

## 2011-12-05 DIAGNOSIS — R21 Rash and other nonspecific skin eruption: Secondary | ICD-10-CM

## 2011-12-05 DIAGNOSIS — F438 Other reactions to severe stress: Secondary | ICD-10-CM

## 2011-12-05 DIAGNOSIS — F430 Acute stress reaction: Secondary | ICD-10-CM

## 2011-12-05 DIAGNOSIS — J45909 Unspecified asthma, uncomplicated: Secondary | ICD-10-CM

## 2011-12-05 DIAGNOSIS — L210 Seborrhea capitis: Secondary | ICD-10-CM

## 2011-12-05 DIAGNOSIS — Z0000 Encounter for general adult medical examination without abnormal findings: Secondary | ICD-10-CM

## 2011-12-05 DIAGNOSIS — R0600 Dyspnea, unspecified: Secondary | ICD-10-CM

## 2011-12-05 MED ORDER — COAL TAR EXTRACT 0.5 % EX SHAM: MEDICATED_SHAMPOO | Freq: Every evening | CUTANEOUS | Status: DC | PRN

## 2011-12-05 MED ORDER — ALBUTEROL SULFATE HFA 108 (90 BASE) MCG/ACT IN AERS: 2.0000 | INHALATION_SPRAY | Freq: Four times a day (QID) | RESPIRATORY_TRACT | Status: DC | PRN

## 2011-12-05 MED ORDER — BUDESONIDE-FORMOTEROL FUMARATE 160-4.5 MCG/ACT IN AERO: 2.0000 | INHALATION_SPRAY | Freq: Two times a day (BID) | RESPIRATORY_TRACT | Status: DC

## 2011-12-05 MED ORDER — KETOCONAZOLE 2 % EX SHAM: MEDICATED_SHAMPOO | CUTANEOUS | Status: AC

## 2011-12-05 NOTE — Progress Notes (Signed)
Subjective:    Patient ID: [name], female    DOB: [birth year], 56 y.o.   MRN: [id number]  Hyperlipidemia This is a recurrent problem. The current episode started more than 1 year ago. Recent lipid tests were reviewed and are variable.  Asthma There is no chest tightness or cough. This is a chronic problem. The problem occurs rarely. The problem has been resolved. Associated symptoms include appetite change, headaches and sweats. Her symptoms are aggravated by emotional stress, animal exposure, any activity, exposure to fumes, exposure to smoke and exercise. Her symptoms are not alleviated by beta-agonist and steroid inhaler. Her past medical history is significant for asthma. There is no history of bronchitis or COPD.   #3 rash on neck and arm. Patient is concerned over rash has been present for the a prolonged period of time and her left ear and right arm. #4 anxiety disorder. Patient reports being very anxious apparently this is been some trouble and turmoil involving her son. While she did not want to go into details or discuss it she referred to it multiple times. She reports at times feeling very depressed and this is despite being on the Celexa 10 mg apparently is more for breakthrough hot flashes and mild anxiety. She states that the stress level and problems occurred in April. #5 health maintenance. Patient has not had a colonoscopy and also has not had a recent tetanus vaccination as well. Discussion was held with her about the importance of these measures.    Review of Systems  Constitutional: Positive for appetite change and unexpected weight change.  Respiratory: Negative for cough.   Neurological: Positive for headaches.  Psychiatric/Behavioral: Positive for behavioral problems and sleep disturbance.      BP 144/78  Pulse 73  Ht 5\' 5"  (1.651 m)  Wt 196 lb (88.905 kg)  BMI 32.62 kg/m2  SpO2 97% Objective:   Physical Exam  Constitutional: She is oriented to person,  place, and time. She appears well-developed and well-nourished.       Obese white female  HENT:  Head: Normocephalic and atraumatic.  Neck: Neck supple.  Cardiovascular: Normal rate, regular rhythm and normal heart sounds.  Exam reveals no gallop.   No murmur heard. Pulmonary/Chest: Effort normal and breath sounds normal. No respiratory distress.  Neurological: She is alert and oriented to person, place, and time.  Skin: Skin is warm and dry.       Rash present left ear right arm to have been pruritic with excoriations present probably from her scratching. Scalp with seborrheic changes.  Psychiatric: She has a normal mood and affect.          Assessment & Plan:    #1 HX of hyperlipidemia. Will obtain lipid panel  #2 history of bronchospasm. Offered to prescribe her Symbicort inhaler 160-4.5 and her pro air HFA inhaler   #3 anxiety depression. Explained to her several times that the stress gets worse she can increase her Celexa from 10 mg to 20 and I was glad to change the prescription if she lets [place] know. #4 health maintenance. Despite my recommendation a referral for colonoscopy updated tetanus vaccination she declines. Will obtain with a lipid TSH, A1c, CMP, and CBC.  #5 skin rash/seborrhea dermatitis. I did explain to her what this means and that way to treat this is to treat the scalp seborrhea and hopefully in 3-4 weeks with see a difference. She is to use the tar shampoo and Nizoral shampoo together. She is to  leave both on for 10 minutes before rinsing off and to do this at least 3 times a week. Return in 6 months

## 2011-12-05 NOTE — Patient Instructions (Signed)
Seborrheic Dermatitis Seborrheic dermatitis involves pink or red skin with greasy, flaky scales. This is often found on the scalp, eyebrows, nose, bearded area, and on or behind the ears. It can also occur on the central chest. It often occurs where there are more oil (sebaceous) glands. This condition is also known as dandruff. When this condition affects a baby's scalp, it is called cradle cap. It may come and go for no known reason. It can occur at any time of life from infancy to old age. CAUSES  The cause is unknown. It is not the result of too little moisture or too much oil. In some people, seborrheic dermatitis flare-ups seem to be triggered by stress. It also commonly occurs in people with certain diseases such as Parkinson's disease or HIV/AIDS. SYMPTOMS   Thick scales on the scalp.   Redness on the face or in the armpits.   The skin may seem oily or dry, but moisturizers do not help.   In infants, seborrheic dermatitis appears as scaly redness that does not seem to bother the baby. In some babies, it affects only the scalp. In others, it also affects the neck creases, armpits, groin, or behind the ears.   In adults and adolescents, seborrheic dermatitis may affect only the scalp. It may look patchy or spread out, with areas of redness and flaking. Other areas commonly affected include:   Eyebrows.   Eyelids.   Forehead.   Skin behind the ears.   Outer ears.   Chest.   Armpits.   Nose creases.   Skin creases under the breasts.   Skin between the buttocks.   Groin.   Some adults and adolescents feel itching or burning in the affected areas.  DIAGNOSIS  Your caregiver can usually tell what the problem is by doing a physical exam. TREATMENT   Cortisone (steroid) ointments, creams, and lotions can help decrease inflammation.   Babies can be treated with baby oil to soften the scales, then they may be washed with baby shampoo. If this does not help, medicated  shampoos should work.   Adults can also use medicated shampoos.   Your caregiver may prescribe corticosteroid cream and shampoo containing an antifungal or yeast medicine (ketoconazole). Hydrocortisone or anti-yeast cream can be rubbed directly onto seborrheic dermatitis patches. Yeast does not cause seborrheic dermatitis, but it seems to add to the problem.  In infants, seborrheic dermatitis is often worst during the first year of life. It tends to disappear on its own as the child grows. However, it may return during the teenage years. In adults and adolescents, seborrheic dermatitis tends to be a long-lasting condition that comes and goes over many years. HOME CARE INSTRUCTIONS   Use prescribed medicines as directed.   In infants, do not aggressively remove the scales or flakes on the scalp with a comb or by other means. This may lead to hair loss.  SEEK MEDICAL CARE IF:   The problem does not improve from the medicated shampoos, lotions, or other medicines given by your caregiver.   You have any other questions or concerns.  Document Released: 07/17/2005 Document Revised: 07/06/2011 Document Reviewed: 12/06/2009 [facility] Patient Information 2012 [place], [place].Reactive Airway Disease, Child Reactive airway disease (RAD) is a condition where your lungs have overreacted to something and caused you to wheeze. As many as 15% of children will experience wheezing in the first year of life and as many as 25% may report a wheezing illness before their 5th birthday.  Many people believe that wheezing problems in a child means the child has the disease asthma. This is not always true. Because not all wheezing is asthma, the term reactive airway disease is often used until a diagnosis is made. A diagnosis of asthma is based on a number of different factors and made by your doctor. The more you know about this illness the better you will be prepared to handle it. Reactive airway disease cannot be cured,  but it can usually be prevented and controlled. CAUSES  For reasons not completely known, a trigger causes your child's airways to become overactive, narrowed, and inflamed.  Some common triggers include:  Allergens (things that cause allergic reactions or allergies).   Infection (usually viral) commonly triggers attacks. Antibiotics are not helpful for viral infections and usually do not help with attacks.   Certain pets.   Pollens, trees, and grasses.   Certain foods.   Molds and dust.   Strong odors.   Exercise can trigger an attack.   Irritants (for example, pollution, cigarette smoke, strong odors, aerosol sprays, paint fumes) may trigger an attack. SMOKING CANNOT BE ALLOWED IN HOMES OF CHILDREN WITH REACTIVE AIRWAY DISEASE.   Weather changes - There does not seem to be one ideal climate for children with RAD. Trying to find one may be disappointing. Moving often does not help. In general:   Winds increase molds and pollens in the air.   Rain refreshes the air by washing irritants out.   Cold air may cause irritation.   Stress and emotional upset - Emotional problems do not cause reactive airway disease, but they can trigger an attack. Anxiety, frustration, and anger may produce attacks. These emotions may also be produced by attacks, because difficulty breathing naturally causes anxiety.  Other Causes Of Wheezing In Children While uncommon, your doctor will consider other cause of wheezing such as:  Breathing in (inhaling) a foreign object.   Structural abnormalities in the lungs.   Prematurity.   Vocal chord dysfunction.   Cardiovascular causes.   Inhaling stomach acid into the lung from gastroesophageal reflux or GERD.   Cystic Fibrosis.  Any child with frequent coughing or breathing problems should be evaluated. This condition may also be made worse by exercise and crying. SYMPTOMS  During a RAD episode, muscles in the lung tighten (bronchospasm) and the  airways become swollen (edema) and inflamed. As a result the airways narrow and produce symptoms including:  Wheezing is the most characteristic problem in this illness.   Frequent coughing (with or without exercise or crying) and recurrent respiratory infections are all early warning signs.   Chest tightness.   Shortness of breath.  While older children may be able to tell you they are having breathing difficulties, symptoms in young children may be harder to know about. Young children may have feeding difficulties or irritability. Reactive airway disease may go for long periods of time without being detected. Because your child may only have symptoms when exposed to certain triggers, it can also be difficult to detect. This is especially true if your caregiver cannot detect wheezing with their stethoscope.  Early Signs of Another RAD Episode The earlier you can stop an episode the better, but everyone is different. Look for the following signs of an RAD episode and then follow your caregiver's instructions. Your child may or may not wheeze. Be on the lookout for the following symptoms:  Your child's skin "sucking in" between the ribs (retractions) when your child breathes  in.   Irritability.   Poor feeding.   Nausea.   Tightness in the chest.   Dry coughing and non-stop coughing.   Sweating.   Fatigue and getting tired more easily than usual.  DIAGNOSIS  After your caregiver takes a history and performs a physical exam, they may perform other tests to try to determine what caused your child's RAD. Tests may include:  A chest x-ray.   Tests on the lungs.   Lab tests.   Allergy testing.  If your caregiver is concerned about one of the uncommon causes of wheezing mentioned above, they will likely perform tests for those specific problems. Your caregiver also may ask for an evaluation by a specialist.  HOME CARE INSTRUCTIONS   Notice the warning signs (see Early Sings of  Another RAD Episode).   Remove your child from the trigger if you can identify it.   Medications taken before exercise allow most children to participate in sports. Swimming is the sport least likely to trigger an attack.   Remain calm during an attack. Reassure the child with a gentle, soothing voice that they will be able to breathe. Try to get them to relax and breathe slowly. When you react this way the child may soon learn to associate your gentle voice with getting better.   Medications can be given at this time as directed by your doctor. If breathing problems seem to be getting worse and are unresponsive to treatment seek immediate medical care. Further care is necessary.   Family members should learn how to give adrenaline (EpiPen) or use an anaphylaxis kit if your child has had severe attacks. Your caregiver can help you with this. This is especially important if you do not have readily accessible medical care.   Schedule a follow up appointment as directed by your caregiver. Ask your child's care giver about how to use your child's medications to avoid or stop attacks before they become severe.   Call your local emergency medical service (911 in the [place] immediately if adrenaline has been given at home. Do this even if your child appears to be a lot better after the shot is given. A later, delayed reaction may develop which can be even more severe.  SEEK MEDICAL CARE IF:   There is wheezing or shortness of breath even if medications are given to prevent attacks.   An oral temperature above 102 F (38.9 C) develops.   There are muscle aches, chest pain, or thickening of sputum.   The sputum changes from clear or white to yellow, green, gray, or bloody.   There are problems that may be related to the medicine you are giving. For example, a rash, itching, swelling, or trouble breathing.  SEEK IMMEDIATE MEDICAL CARE IF:   The usual medicines do not stop your child's wheezing, or  there is increased coughing.   Your child has increased difficulty breathing.   Retractions are present. Retractions are when the child's ribs appear to stick out while breathing.   Your child is not acting normally, passes out, or has color changes such as blue lips.   There are breathing difficulties with an inability to speak or cry or grunts with each breath.  Document Released: 07/17/2005 Document Revised: 07/06/2011 Document Reviewed: 04/06/2009 [facility] Patient Information 2012 [place], [place].

## 2011-12-06 ENCOUNTER — Ambulatory Visit (HOSPITAL_COMMUNITY): Payer: Self-pay

## 2011-12-12 ENCOUNTER — Telehealth: Payer: Self-pay | Admitting: *Deleted

## 2011-12-12 NOTE — Telephone Encounter (Signed)
Thank you - agree with plan.

## 2011-12-12 NOTE — Telephone Encounter (Signed)
Pt called back to report that she is feeling much better.

## 2011-12-12 NOTE — Telephone Encounter (Signed)
Pt calls stating she started having palpitations after eating lunch and drinking 2 caffeine drinks very quickly. She admits to rushing around all morning and having more caffeine than normal. She denies chest pain and/or any other Sxs. She is now resting to see if Sxs get better. I advised Pt to call us back in an hour to let us know how she is feeling. If no better, we will get her in here or UC.

## 2011-12-22 ENCOUNTER — Other Ambulatory Visit: Payer: Self-pay | Admitting: Family Medicine

## 2011-12-23 LAB — LIPID PANEL
Cholesterol: 200 mg/dL (ref 0–200)
HDL: 53 mg/dL (ref >39)
Total CHOL/HDL Ratio: 3.8 Ratio
Triglycerides: 186 mg/dL — ABNORMAL HIGH (ref <150)
VLDL: 37 mg/dL (ref 0–40)

## 2011-12-29 ENCOUNTER — Ambulatory Visit (HOSPITAL_COMMUNITY)
Admission: RE | Admit: 2011-12-29 | Discharge: 2011-12-29 | Disposition: A | Discharge status: Home / Self Care | Payer: Self-pay | Source: Ambulatory Visit | Attending: Obstetrics & Gynecology | Admitting: Obstetrics & Gynecology

## 2011-12-29 DIAGNOSIS — Z1231 Encounter for screening mammogram for malignant neoplasm of breast: Secondary | ICD-10-CM

## 2012-01-10 ENCOUNTER — Ambulatory Visit: Payer: Self-pay | Admitting: Cardiovascular Disease

## 2012-05-01 ENCOUNTER — Ambulatory Visit (INDEPENDENT_AMBULATORY_CARE_PROVIDER_SITE_OTHER): Payer: Self-pay | Admitting: Family Medicine

## 2012-05-01 ENCOUNTER — Encounter: Payer: Self-pay | Admitting: Family Medicine

## 2012-05-01 VITALS — BP 153/88 | HR 73 | Wt 198.0 lb

## 2012-05-01 DIAGNOSIS — F419 Anxiety disorder, unspecified: Secondary | ICD-10-CM

## 2012-05-01 DIAGNOSIS — R1013 Epigastric pain: Secondary | ICD-10-CM

## 2012-05-01 DIAGNOSIS — L299 Pruritus, unspecified: Secondary | ICD-10-CM

## 2012-05-01 DIAGNOSIS — R079 Chest pain, unspecified: Secondary | ICD-10-CM

## 2012-05-01 DIAGNOSIS — K3189 Other diseases of stomach and duodenum: Secondary | ICD-10-CM

## 2012-05-01 DIAGNOSIS — K30 Functional dyspepsia: Secondary | ICD-10-CM

## 2012-05-01 DIAGNOSIS — F411 Generalized anxiety disorder: Secondary | ICD-10-CM

## 2012-05-01 MED ORDER — CITALOPRAM HYDROBROMIDE 20 MG PO TABS: 20.0000 mg | ORAL_TABLET | Freq: Every day | ORAL | Status: DC

## 2012-05-01 NOTE — Addendum Note (Signed)
Addended by: Wyline Beady on: 05/01/2012 10:23 AM   Modules accepted: Orders

## 2012-05-01 NOTE — Progress Notes (Signed)
CC: [name] is a 56 y.o. female is here for Chest Pain and Anxiety   Subjective: HPI:  Patient presents with one to 2 weeks of chest discomfort which is currently experiencing today in the clinic and started this morning soon after waking. Is not related to exertion or rest. It is worse with eating foods such as cheetos, processed foods, fatty foods which she admits to binge eating over the weekend in the past few days do to stress. The pain is anterior just below her breast bone and does not radiate. Is described as a twinge and is well localized. She denies shortness of breath, irregular heartbeat, orthopnea, jaw pain, left arm numbness or discomfort, sensory motor disturbances, nor back pain. She denies abdominal pain or change in stooling patterns.  She is quite upset about her son's financial reliance and emotional reliance on her, he's about to have a child and it sounds like his already been quite a financial burden on [name].  She notes that the chest discomfort seems to correlate with times of increased family responsibility stressors from the son in addition to times where she eats poorly because this helps relieve her stress. She admits that she probably needs to see a behavior health specialist, she canceled a formal referral months ago as she felt that things were getting better. She feels that the citalopram offers her benefit from an anxiety and "stress overwhelming"standpoint. She denies alcohol, tobacco, or recreational street drug use. There is no paranoia, hallucinations, thoughts when her herself  She's been experiencing itchiness of her scalp for matter of weeks to months and she's worried that she might have mites or lice. She's had no known exposures. The more she scratches the more it itches. Has been no interventions as of yet. She denies flaking, burning, hair loss, fevers, chills.  Review Of Systems Outlined In HPI  Past Medical History  Diagnosis Date  . Heart  palpitations     hx of MVP  . Chest pain, atypical   . Anxiety   . IBS (irritable bowel syndrome)   . Asthma      Family History  Problem Relation Age of Onset  . Heart attack Father   . Lung cancer Mother      History  Substance Use Topics  . Smoking status: Never Smoker   . Smokeless tobacco: Not on file   Comment: nonsmoker  . Alcohol Use: No     Objective: Filed Vitals:   05/01/12 0925  BP: 153/88  Pulse: 73    General: Alert and Oriented, No Acute Distress HEENT: Pupils equal, round, reactive to light. Conjunctivae clear. Moist mucous membranes, pharynx without inflammation nor lesions.  Neck supple without palpable lymphadenopathy nor abnormal masses. Lungs: Clear to auscultation bilaterally, no wheezing/ronchi/rales.  Comfortable work of breathing. Good air movement. Cardiac: Regular rate and rhythm. Normal S1/S2.  No murmurs, rubs, nor gallops.  No carotid bruits Abdomen: Normal bowel sounds, discomfort reproduced with epigastric palpation Extremities: No peripheral edema.  Strong peripheral pulses.  Mental Status: No depression, somewhat agitated, pressured speech Skin: Warm and dry. Scalp has 8-9 fleshy colored patches with a waxy appearance appear to be "stuck on" with excoriations. No evidence of infestation of the scalp.  My interpretation of her EKG reveals normal sinus rhythm, regular axis, normal intervals, no pathologic Q waves, no ST segment elevation or depression. This tracing is essentially identical to a tracing from June 2012.  Assessment & Plan: [name] was seen today for  chest pain and anxiety.  Diagnoses and associated orders for this visit:  Anxiety - Ambulatory referral to Psychiatry - citalopram (CELEXA) 20 MG tablet; Take 1 tablet (20 mg total) by mouth daily.  Chest pain  Itchy scalp  Indigestion    Discussed with patient that her chest pain was unlikely cardiac in origin, more likely indigestion, low suspicion for pulmonary  process. Together we decided to increase her citalopram to 20 mg a day, I've asked return to see me or her PCP in 2 weeks after this transition to see if this provides any benefit. Additionally she's quite open to a referral to psychiatry downstairs do to trouble dealing with the stressors at her son's placing on her. I believe her scalp has scattered seborrheic keratoses that she's been scratching and causing inflammation, we discussed scalp more striking measures including occasional olive oil use and limited washings.  Return in about 2 weeks (around 05/15/2012).

## 2012-05-02 ENCOUNTER — Encounter: Payer: Self-pay | Admitting: *Deleted

## 2012-06-14 ENCOUNTER — Other Ambulatory Visit: Payer: Self-pay | Admitting: Cardiovascular Disease

## 2012-06-14 DIAGNOSIS — J45909 Unspecified asthma, uncomplicated: Secondary | ICD-10-CM

## 2012-06-14 DIAGNOSIS — R0600 Dyspnea, unspecified: Secondary | ICD-10-CM

## 2012-06-14 NOTE — Telephone Encounter (Signed)
Refill request sent to  [facility]  patient program Phone  #  [phone].  Fax #  [phone]

## 2012-06-21 ENCOUNTER — Other Ambulatory Visit: Payer: Self-pay | Admitting: Cardiovascular Disease

## 2012-06-21 NOTE — Telephone Encounter (Signed)
symbicort refill 160-4.5 mg. Patient assistance program  to [facility] -     [facility] in [place] [address] .  [phone].

## 2012-06-24 ENCOUNTER — Other Ambulatory Visit: Payer: Self-pay | Admitting: Family Medicine

## 2012-06-24 ENCOUNTER — Other Ambulatory Visit: Payer: Self-pay | Admitting: *Deleted

## 2012-06-24 DIAGNOSIS — F419 Anxiety disorder, unspecified: Secondary | ICD-10-CM

## 2012-06-24 MED ORDER — CITALOPRAM HYDROBROMIDE 20 MG PO TABS: 20.0000 mg | ORAL_TABLET | Freq: Every day | ORAL | Status: DC

## 2012-06-24 NOTE — Telephone Encounter (Signed)
Needs appointment

## 2012-07-05 ENCOUNTER — Telehealth: Payer: Self-pay | Admitting: Cardiovascular Disease

## 2012-07-05 DIAGNOSIS — R0600 Dyspnea, unspecified: Secondary | ICD-10-CM

## 2012-07-05 DIAGNOSIS — J45909 Unspecified asthma, uncomplicated: Secondary | ICD-10-CM

## 2012-07-05 MED ORDER — BUDESONIDE-FORMOTEROL FUMARATE 160-4.5 MCG/ACT IN AERO: 2.0000 | INHALATION_SPRAY | Freq: Two times a day (BID) | RESPIRATORY_TRACT | Status: DC

## 2012-07-05 NOTE — Telephone Encounter (Signed)
Siymbicort 160-4.5 mcg/act inhaler 2 puffs twice a day faxed to [facility]; dispense 3 inhalers and 3 refills. Pt aware.

## 2012-07-05 NOTE — Telephone Encounter (Signed)
F/u   plz return call to pt [phone] regarding incomplete RX information (dosage instructions, strength, quantity).

## 2012-07-05 NOTE — Telephone Encounter (Signed)
New Problem:    Patient called in needing a refill of her budesonide-formoterol (SYMBICORT) 160-4.5 MCG/ACT inhaler. Please call back if you have any questions.

## 2012-07-30 ENCOUNTER — Telehealth: Payer: Self-pay | Admitting: *Deleted

## 2012-07-30 MED ORDER — CITALOPRAM HYDROBROMIDE 10 MG PO TABS: 10.0000 mg | ORAL_TABLET | Freq: Every day | ORAL | Status: DC

## 2012-07-30 NOTE — Telephone Encounter (Signed)
Pt called and states she needs a refill of her citalopram. Pt states the 20 mg dose was too strong for her and she wants to stay on 10mg . Advised pt that Dr. Ivan Anchors wanted her to f/u in two weeks to see how she was doing on the medication. Will call in another 30 day supply and in the meantime pt was transferred to schedule a f/u appt with Dr. Ivan Anchors in the near future.

## 2012-08-09 ENCOUNTER — Ambulatory Visit: Payer: Self-pay | Admitting: Family Medicine

## 2012-08-15 ENCOUNTER — Ambulatory Visit: Payer: Self-pay | Admitting: Family Medicine

## 2012-08-23 ENCOUNTER — Encounter: Payer: Self-pay | Admitting: Family Medicine

## 2012-08-23 ENCOUNTER — Ambulatory Visit (INDEPENDENT_AMBULATORY_CARE_PROVIDER_SITE_OTHER): Payer: Self-pay | Admitting: Family Medicine

## 2012-08-23 VITALS — BP 145/81 | HR 88 | Wt 198.0 lb

## 2012-08-23 DIAGNOSIS — F411 Generalized anxiety disorder: Secondary | ICD-10-CM

## 2012-08-23 DIAGNOSIS — I10 Essential (primary) hypertension: Secondary | ICD-10-CM

## 2012-08-23 DIAGNOSIS — F419 Anxiety disorder, unspecified: Secondary | ICD-10-CM

## 2012-08-23 MED ORDER — HYDROCHLOROTHIAZIDE 25 MG PO TABS: 25.0000 mg | ORAL_TABLET | Freq: Every day | ORAL | Status: DC

## 2012-08-23 NOTE — Progress Notes (Signed)
CC: [name] is a 57 y.o. female is here for Hypertension   Subjective: HPI:  Patient presents for anxiety followup  Anxiety: She's been taking citalopram 10 mg a daily basis. She is happy with the effect recently and remotely. She stills has moments of anxiousness and irritability, these are almost always occurring when at work having business conflicts between she and her son or other family members. She notices her irritability and anxiousness improves when she spends time with her dog, exercises, and gets away from work. She describes her symptoms above is mild in severity. They are present on a daily basis. She doesn't feel anything is getting worse  Elevated blood pressure: She's been taking her blood pressures at home on an almost daily basis, she has averaged out her systolic to be 143 over the past 2-3 weeks. She's unsure what her diastolics have averaged out to be. She notices she frequently has blood pressures above 140/90, he seemed to get worse with increased salt intake but she's been unable to curb her salt intake. No formal exercise program, she feels the weather is discouraging for this. She denies chest pain, shortness of breath, orthopnea, peripheral edema, motor sensory disturbances, headaches    Review Of Systems Outlined In HPI  Past Medical History  Diagnosis Date  . Heart palpitations     hx of MVP  . Chest pain, atypical   . Anxiety   . IBS (irritable bowel syndrome)   . Asthma      Family History  Problem Relation Age of Onset  . Heart attack Father   . Lung cancer Mother      History  Substance Use Topics  . Smoking status: Never Smoker   . Smokeless tobacco: Not on file     Comment: nonsmoker  . Alcohol Use: No     Objective: Filed Vitals:   08/23/12 0854  BP: 145/81  Pulse: 88    General: Alert and Oriented, No Acute Distress HEENT: Pupils equal, round, reactive to light. Conjunctivae clear. Moist mucous membranes,   Neck supple  without palpable lymphadenopathy nor abnormal masses. Lungs: Clear to auscultation bilaterally, no wheezing/ronchi/rales.  Comfortable work of breathing. Good air movement. Cardiac: Regular rate and rhythm. Normal S1/S2.  No murmurs, rubs, nor gallops.   Extremities: No peripheral edema.  Strong peripheral pulses.  Mental Status: Mild hyperactivity, no depression, talkative Skin: Warm and dry.  Assessment & Plan: [name] was seen today for hypertension.  Diagnoses and associated orders for this visit:  Anxiety  Essential hypertension - hydrochlorothiazide (HYDRODIURIL) 25 MG tablet; Take 1 tablet (25 mg total) by mouth daily.    Anxiety: Stable and controlled, discussed with her there is always room for improvement and this could be helped by a daily walking routine which he gets out of the house even if that at the mall or at [facility], focus for 30-45 minutes of continuous exercise a day. Continue citalopram Essential hypertension: Discussed her new diagnosis, she's over to the idea of starting hydrochlorothiazide and will strive to meet the exercise recommendations listed above.  Return in about 4 weeks (around 09/20/2012).

## 2012-09-10 ENCOUNTER — Telehealth: Payer: Self-pay | Admitting: *Deleted

## 2012-09-10 MED ORDER — CITALOPRAM HYDROBROMIDE 10 MG PO TABS: 10.0000 mg | ORAL_TABLET | Freq: Every day | ORAL | Status: DC

## 2012-09-10 NOTE — Telephone Encounter (Signed)
Left message on vm

## 2012-09-10 NOTE — Telephone Encounter (Signed)
Pt calls & wants to know if you can fill her citalopram 10mg  for 90 days instead of 30 because she works 12 hour shifts & it would be easier to pick up every 90 days.  Please advise

## 2012-09-10 NOTE — Telephone Encounter (Signed)
I'd be happy to, [facility] [address]

## 2012-09-23 ENCOUNTER — Ambulatory Visit (INDEPENDENT_AMBULATORY_CARE_PROVIDER_SITE_OTHER): Payer: Self-pay | Admitting: Family Medicine

## 2012-09-23 ENCOUNTER — Telehealth: Payer: Self-pay | Admitting: *Deleted

## 2012-09-23 VITALS — BP 139/82 | HR 85 | Temp 98.9°F | Wt 192.0 lb

## 2012-09-23 DIAGNOSIS — I10 Essential (primary) hypertension: Secondary | ICD-10-CM

## 2012-09-23 DIAGNOSIS — B9689 Other specified bacterial agents as the cause of diseases classified elsewhere: Secondary | ICD-10-CM

## 2012-09-23 DIAGNOSIS — J329 Chronic sinusitis, unspecified: Secondary | ICD-10-CM

## 2012-09-23 DIAGNOSIS — A499 Bacterial infection, unspecified: Secondary | ICD-10-CM

## 2012-09-23 MED ORDER — DOXYCYCLINE HYCLATE 100 MG PO TABS: ORAL_TABLET | ORAL | Status: DC

## 2012-09-23 MED ORDER — AZITHROMYCIN 250 MG PO TABS: ORAL_TABLET | ORAL | Status: AC

## 2012-09-23 NOTE — Progress Notes (Signed)
CC: [name] is a 57 y.o. female is here for Sinusitis   Subjective: HPI:  Patient complains of one week of nasal congestion, worsening on daily basis. Accompanied by facial pressure and headache for the past 3 days, seems to be getting worse. Above symptoms described as moderate in severity. Nothing makes better, worsened by cleaning products used to clean up after her dog. She also notices symptoms are worse in the evening hours and early morning hours. No interventions as of yet other than cough drops. She had subjective fevers and chills last night which prompted today's visit.    Followup hypertension: Patient did not start hydrochlorothiazide, instead has successfully been losing weight and keeping salt intake to less than 2 g a day. She reports outside blood pressures consistently below 140/90. She denies motor or sensory disturbances, chest pain, shortness of breath, orthopnea, peripheral edema, nor irregular heartbeat.  Review Of Systems Outlined In HPI  Past Medical History  Diagnosis Date  . Heart palpitations     hx of MVP  . Chest pain, atypical   . Anxiety   . IBS (irritable bowel syndrome)   . Asthma      Family History  Problem Relation Age of Onset  . Heart attack Father   . Lung cancer Mother      History  Substance Use Topics  . Smoking status: Never Smoker   . Smokeless tobacco: Not on file     Comment: nonsmoker  . Alcohol Use: No     Objective: Filed Vitals:   09/23/12 0943  BP: 139/82  Pulse: 85  Temp: 98.9 F (37.2 C)    General: Alert and Oriented, No Acute Distress HEENT: Pupils equal, round, reactive to light. Conjunctivae clear.  External ears unremarkable, canals clear with intact TMs with appropriate landmarks.  Middle ear appears open without effusion. Pink inferior turbinates.  Moist mucous membranes, pharynx without inflammation nor lesions.  Neck supple without palpable lymphadenopathy nor abnormal masses. Frontal sinus tenderness  to percussion Lungs: Clear to auscultation bilaterally, no wheezing/ronchi/rales.  Comfortable work of breathing. Good air movement. Cardiac: Regular rate and rhythm. Normal S1/S2.  No murmurs, rubs, nor gallops.   Extremities: No peripheral edema.  Strong peripheral pulses.  Mental Status: No depression, anxiety, nor agitation. Skin: Warm and dry.  Assessment & Plan: Nathali was seen today for sinusitis.  Diagnoses and associated orders for this visit:  Essential hypertension  Bacterial sinusitis - Discontinue: doxycycline (VIBRA-TABS) 100 MG tablet; One by mouth twice a day for ten days. - doxycycline (VIBRA-TABS) 100 MG tablet; One by mouth twice a day for ten days.    Essential hypertension: Improved and controlled, continue to manage with lifestyle and behavioral interventions and may follow up in 3 months Bacterial sinusitis: Discussed starting doxycycline, nasal decongestant of choice, consider nasal saline washes.  25 minutes spent face-to-face during visit today of which at least 50% was counseling or coordinating care regarding essential hypertension and bacterial sinusitis..   Return in about 3 months (around 12/21/2012).

## 2012-09-23 NOTE — Telephone Encounter (Signed)
Pt was notified and she states she already picked up the doxy.

## 2012-09-23 NOTE — Telephone Encounter (Signed)
Pt called and says the abx sent in was too expensive. Are there any cheaper alternative/

## 2012-09-23 NOTE — Telephone Encounter (Signed)
Sue Lush, Azithromycin rx printed and placed in your folder, may fax to whichever pharmacy she chooses.

## 2012-12-18 ENCOUNTER — Telehealth: Payer: Self-pay | Admitting: *Deleted

## 2012-12-18 NOTE — Telephone Encounter (Signed)
Pt called requesting a RF on Activella.  Pt's last visit with Dr Penne Lash was 1/13 and she has not had a mammogram since 1/13.  Pt must make appt before any further RF's will be given.

## 2012-12-24 ENCOUNTER — Other Ambulatory Visit: Payer: Self-pay | Admitting: *Deleted

## 2012-12-24 DIAGNOSIS — Z780 Asymptomatic menopausal state: Secondary | ICD-10-CM

## 2012-12-24 MED ORDER — ESTRADIOL-NORETHINDRONE ACET 0.5-0.1 MG PO TABS: 1.0000 | ORAL_TABLET | Freq: Every day | ORAL | Status: DC

## 2012-12-24 NOTE — Telephone Encounter (Signed)
Pt requesting RF on Activella.  Pt is overdue for appt with Dr Penne Lash and for her mamogram.  Pt has made appt for July 9 with Dr Penne Lash.  No further RF's will be granted after this one until seen my MD

## 2012-12-25 ENCOUNTER — Other Ambulatory Visit: Payer: Self-pay | Admitting: Family Medicine

## 2012-12-25 DIAGNOSIS — Z1231 Encounter for screening mammogram for malignant neoplasm of breast: Secondary | ICD-10-CM

## 2013-01-07 ENCOUNTER — Ambulatory Visit (HOSPITAL_COMMUNITY): Payer: Self-pay

## 2013-01-16 ENCOUNTER — Ambulatory Visit: Payer: Self-pay | Admitting: Obstetrics & Gynecology

## 2013-01-16 ENCOUNTER — Ambulatory Visit (HOSPITAL_COMMUNITY): Payer: Self-pay

## 2013-01-29 ENCOUNTER — Ambulatory Visit (HOSPITAL_COMMUNITY)
Admission: RE | Admit: 2013-01-29 | Discharge: 2013-01-29 | Disposition: A | Discharge status: Home / Self Care | Payer: Self-pay | Source: Ambulatory Visit | Attending: Family Medicine | Admitting: Family Medicine

## 2013-01-29 DIAGNOSIS — Z1231 Encounter for screening mammogram for malignant neoplasm of breast: Secondary | ICD-10-CM

## 2013-01-30 ENCOUNTER — Encounter: Payer: Self-pay | Admitting: Obstetrics & Gynecology

## 2013-01-30 ENCOUNTER — Ambulatory Visit (INDEPENDENT_AMBULATORY_CARE_PROVIDER_SITE_OTHER): Payer: Self-pay | Admitting: Obstetrics & Gynecology

## 2013-01-30 VITALS — BP 146/82 | HR 76 | Resp 16 | Ht 65.0 in | Wt 194.0 lb

## 2013-01-30 DIAGNOSIS — Z1151 Encounter for screening for human papillomavirus (HPV): Secondary | ICD-10-CM

## 2013-01-30 DIAGNOSIS — Z124 Encounter for screening for malignant neoplasm of cervix: Secondary | ICD-10-CM

## 2013-01-30 DIAGNOSIS — N951 Menopausal and female climacteric states: Secondary | ICD-10-CM | POA: Insufficient documentation

## 2013-01-30 DIAGNOSIS — Z780 Asymptomatic menopausal state: Secondary | ICD-10-CM

## 2013-01-30 DIAGNOSIS — Z01419 Encounter for gynecological examination (general) (routine) without abnormal findings: Secondary | ICD-10-CM

## 2013-01-30 MED ORDER — ESTRADIOL-NORETHINDRONE ACET 0.5-0.1 MG PO TABS: 1.0000 | ORAL_TABLET | Freq: Every day | ORAL | Status: DC
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
## 2013-02-05 ENCOUNTER — Ambulatory Visit: Payer: Self-pay | Admitting: Obstetrics & Gynecology

## 2013-03-24 ENCOUNTER — Telehealth: Payer: Self-pay | Admitting: Family Medicine

## 2013-03-24 NOTE — Telephone Encounter (Signed)
Pt states she has a lot going on and would like Dr to call script for  Zanax in  to help calm her nerves.

## 2013-03-24 NOTE — Telephone Encounter (Signed)
Consideration of a controlled substance medication requires an office visit.

## 2013-03-24 NOTE — Telephone Encounter (Signed)
Pt.notified

## 2013-08-15 ENCOUNTER — Ambulatory Visit: Payer: Self-pay | Admitting: Family Medicine

## 2013-09-05 ENCOUNTER — Ambulatory Visit: Payer: Self-pay | Admitting: Family Medicine

## 2013-09-09 ENCOUNTER — Ambulatory Visit: Payer: Self-pay | Admitting: Family Medicine

## 2013-09-11 ENCOUNTER — Ambulatory Visit: Payer: Self-pay | Admitting: Family Medicine

## 2013-09-15 ENCOUNTER — Encounter: Payer: Self-pay | Admitting: Family Medicine

## 2013-09-15 ENCOUNTER — Ambulatory Visit (INDEPENDENT_AMBULATORY_CARE_PROVIDER_SITE_OTHER): Payer: Self-pay | Admitting: Family Medicine

## 2013-09-15 VITALS — BP 151/86 | HR 94 | Wt 199.0 lb

## 2013-09-15 DIAGNOSIS — J45909 Unspecified asthma, uncomplicated: Secondary | ICD-10-CM

## 2013-09-15 DIAGNOSIS — F411 Generalized anxiety disorder: Secondary | ICD-10-CM

## 2013-09-15 DIAGNOSIS — I10 Essential (primary) hypertension: Secondary | ICD-10-CM

## 2013-09-15 DIAGNOSIS — Z13220 Encounter for screening for lipoid disorders: Secondary | ICD-10-CM

## 2013-09-15 MED ORDER — CITALOPRAM HYDROBROMIDE 10 MG PO TABS: 10.0000 mg | ORAL_TABLET | Freq: Every day | ORAL | Status: DC

## 2013-09-15 MED ORDER — BUDESONIDE-FORMOTEROL FUMARATE 160-4.5 MCG/ACT IN AERO: 2.0000 | INHALATION_SPRAY | Freq: Two times a day (BID) | RESPIRATORY_TRACT | Status: DC

## 2013-09-15 MED ORDER — ALBUTEROL SULFATE HFA 108 (90 BASE) MCG/ACT IN AERS: 2.0000 | INHALATION_SPRAY | Freq: Four times a day (QID) | RESPIRATORY_TRACT | Status: DC | PRN

## 2013-09-16 ENCOUNTER — Ambulatory Visit: Payer: Self-pay | Admitting: Cardiovascular Disease

## 2013-09-16 NOTE — Progress Notes (Signed)
CC: [name] is a 58 y.o. female is here for Anxiety   Subjective: HPI:  Followup anxiety: Continues on citalopram 10 mg daily states that anxiousness is currently of mild severity worsened by the aging of her dog and the realization that the dog may pass within the next 1-3 years. Nothing else is making anxiety better or worse. Denies depression but does admit to tearful episodes when thinking about the dogs death. Denies mental disturbance otherwise  Followup asthma: Continues on Symbicort on a daily basis without missed doses she cannot recall last time she had to use her albuterol it's been greater than 3 months. Denies wheezing, cough, shortness of breath, nor chest discomfort.  Followup essential hypertension: Continues to report blood pressures consistently below 140/90 outside of her office has not been taking hydrochlorothiazide or any other antihypertensive. Sticking to a low salt diet trying to stay physically active but admits room for improvement with activity denies chest pain   Review Of Systems Outlined In HPI  Past Medical History  Diagnosis Date  . Heart palpitations     hx of MVP  . Chest pain, atypical   . Anxiety   . IBS (irritable bowel syndrome)   . Asthma     Past Surgical History  Procedure Laterality Date  . Eab      x2    Family History  Problem Relation Age of Onset  . Heart attack Father   . Lung cancer Mother     History   Social History  . Marital Status: Divorced    Spouse Name: N/A    Number of Children: N/A  . Years of Education: N/A   Occupational History  . Not on file.   Social History Main Topics  . Smoking status: Never Smoker   . Smokeless tobacco: Not on file     Comment: nonsmoker  . Alcohol Use: No  . Drug Use: No  . Sexual Activity: Yes   Other Topics Concern  . Not on file   Social History Narrative   Divorced; grown son lives in [place].    [facility]- PCP     Objective: BP 151/86  Pulse 94  Wt  199 lb (90.266 kg)  General: Alert and Oriented, No Acute Distress HEENT: Pupils equal, round, reactive to light. Conjunctivae clear.  Moist mucous membranes pharynx unremarkable Lungs: Clear to auscultation bilaterally, no wheezing/ronchi/rales.  Comfortable work of breathing. Good air movement. Cardiac: Regular rate and rhythm. Normal S1/S2.  No murmurs, rubs, nor gallops.   Extremities: No peripheral edema.  Strong peripheral pulses.  Mental Status: No depression, anxiety, nor agitation. Skin: Warm and dry.  Assessment & Plan: [name] was seen today for anxiety.  Diagnoses and associated orders for this visit:  Essential hypertension - BASIC METABOLIC PANEL WITH GFR  Other anxiety states - citalopram (CELEXA) 10 MG tablet; Take 1 tablet (10 mg total) by mouth daily.  Asthma in adult - Discontinue: albuterol (PROAIR HFA) 108 (90 BASE) MCG/ACT inhaler; Inhale 2 puffs into the lungs every 6 (six) hours as needed for wheezing. - Discontinue: budesonide-formoterol (SYMBICORT) 160-4.5 MCG/ACT inhaler; Inhale 2 puffs into the lungs 2 (two) times daily. - budesonide-formoterol (SYMBICORT) 160-4.5 MCG/ACT inhaler; Inhale 2 puffs into the lungs 2 (two) times daily. - albuterol (PROAIR HFA) 108 (90 BASE) MCG/ACT inhaler; Inhale 2 puffs into the lungs every 6 (six) hours as needed for wheezing.  Lipid screening - Lipid panel    Essential hypertension: Uncontrolled in our  office have asked her to bring in her blood pressure cuff at the next visit so that we can compare readings Anxiety: Controlled will continue on citalopram we discussed at its normal to get emotional when thinking about our pets deaths, if this begins to interfere with her quality of life which she currently is not we can always go up on citalopram Asthma: Controlled continue symbicort and as needed albuterol   Return in about 3 months (around 12/13/2013).

## 2013-09-22 ENCOUNTER — Telehealth: Payer: Self-pay | Admitting: *Deleted

## 2013-09-22 DIAGNOSIS — J45909 Unspecified asthma, uncomplicated: Secondary | ICD-10-CM

## 2013-09-22 MED ORDER — BUDESONIDE-FORMOTEROL FUMARATE 160-4.5 MCG/ACT IN AERO: 2.0000 | INHALATION_SPRAY | Freq: Two times a day (BID) | RESPIRATORY_TRACT | Status: DC

## 2013-09-22 NOTE — Telephone Encounter (Signed)
Pt called back and left a message that she needed to pick a paper copy of the rx symbicort so she can mail it to [facility]. Will print rx and place up front

## 2013-09-22 NOTE — Telephone Encounter (Signed)
Pt called and states " not to fax the symbicort rx because there is a form that needs to be faxed to [facility]" and that she will come by and pick rx up. I am not sure what she is referring to because I do not have any forms for her and [facility] was [facility] on [facility]/16. Left her message about this on her vm

## 2013-09-24 ENCOUNTER — Telehealth: Payer: Self-pay | Admitting: Family Medicine

## 2013-09-24 NOTE — Telephone Encounter (Signed)
Seth Bake, Will you please fax off and then mail the [place] savings program that I've completed and placed in your inbox.  Will you also please let patient know this was completed.

## 2013-09-26 ENCOUNTER — Telehealth: Payer: Self-pay | Admitting: *Deleted

## 2013-09-26 NOTE — Telephone Encounter (Signed)
I mailed the origanl form along with ok confirmation of fax to pt but I just realized she wanted me to mail it in addition to faxing it to the med assistance program. i called the pt and let her know and she said no problem.

## 2013-09-26 NOTE — Telephone Encounter (Signed)
Faxed and original along with ok confirmation mailed to pt

## 2013-10-08 ENCOUNTER — Other Ambulatory Visit: Payer: Self-pay | Admitting: Family Medicine

## 2013-11-20 ENCOUNTER — Telehealth: Payer: Self-pay | Admitting: Family Medicine

## 2013-11-20 NOTE — Telephone Encounter (Signed)
Patient called and request to have Dr. Lajoyce Lauber nurse call her back. Thanks

## 2013-11-20 NOTE — Telephone Encounter (Signed)
Pt had concerns about her BS being elevated adn stress. Advised to schedule an appt

## 2013-11-21 ENCOUNTER — Ambulatory Visit: Payer: Self-pay | Admitting: Family Medicine

## 2013-12-30 ENCOUNTER — Ambulatory Visit: Payer: Self-pay | Admitting: Family Medicine

## 2013-12-30 LAB — LIPID PANEL
CHOL/HDL RATIO: 3.7 ratio
CHOLESTEROL: 202 mg/dL — AB (ref 0–200)
HDL: 54 mg/dL (ref >39)
LDL CALC: 110 mg/dL — AB (ref 0–99)
TRIGLYCERIDES: 191 mg/dL — AB (ref <150)
VLDL: 38 mg/dL (ref 0–40)

## 2013-12-30 LAB — BASIC METABOLIC PANEL WITH GFR
BUN: 14 mg/dL (ref 6–23)
CALCIUM: 9.5 mg/dL (ref 8.4–10.5)
CHLORIDE: 102 meq/L (ref 96–112)
CO2: 27 meq/L (ref 19–32)
CREATININE: 0.87 mg/dL (ref 0.50–1.10)
GFR, Est African American: 84 mL/min
GFR, Est Non African American: 73 mL/min
GLUCOSE: 103 mg/dL — AB (ref 70–99)
Potassium: 5 mEq/L (ref 3.5–5.3)
Sodium: 137 mEq/L (ref 135–145)

## 2013-12-31 ENCOUNTER — Encounter: Payer: Self-pay | Admitting: Family Medicine

## 2013-12-31 DIAGNOSIS — E785 Hyperlipidemia, unspecified: Secondary | ICD-10-CM | POA: Insufficient documentation

## 2013-12-31 DIAGNOSIS — R739 Hyperglycemia, unspecified: Secondary | ICD-10-CM | POA: Insufficient documentation

## 2014-02-19 ENCOUNTER — Other Ambulatory Visit: Payer: Self-pay | Admitting: Obstetrics & Gynecology

## 2014-02-19 DIAGNOSIS — Z780 Asymptomatic menopausal state: Secondary | ICD-10-CM

## 2014-02-19 NOTE — Telephone Encounter (Signed)
[facility]ella [facility] 1 month.  Pt has appt 03/03/14 with Dr Gala Romney.

## 2014-02-24 ENCOUNTER — Ambulatory Visit: Payer: Self-pay | Admitting: Family Medicine

## 2014-02-25 ENCOUNTER — Ambulatory Visit: Payer: Self-pay | Admitting: Family Medicine

## 2014-02-26 ENCOUNTER — Ambulatory Visit (INDEPENDENT_AMBULATORY_CARE_PROVIDER_SITE_OTHER): Payer: Self-pay | Admitting: Family Medicine

## 2014-02-26 ENCOUNTER — Encounter: Payer: Self-pay | Admitting: Family Medicine

## 2014-02-26 VITALS — BP 143/79 | HR 73 | Ht 65.0 in | Wt 199.0 lb

## 2014-02-26 DIAGNOSIS — L309 Dermatitis, unspecified: Secondary | ICD-10-CM

## 2014-02-26 DIAGNOSIS — L259 Unspecified contact dermatitis, unspecified cause: Secondary | ICD-10-CM

## 2014-02-26 DIAGNOSIS — R739 Hyperglycemia, unspecified: Secondary | ICD-10-CM

## 2014-02-26 DIAGNOSIS — F419 Anxiety disorder, unspecified: Secondary | ICD-10-CM

## 2014-02-26 DIAGNOSIS — L300 Nummular dermatitis: Secondary | ICD-10-CM | POA: Insufficient documentation

## 2014-02-26 DIAGNOSIS — J45998 Other asthma: Secondary | ICD-10-CM | POA: Insufficient documentation

## 2014-02-26 DIAGNOSIS — E119 Type 2 diabetes mellitus without complications: Secondary | ICD-10-CM

## 2014-02-26 DIAGNOSIS — F411 Generalized anxiety disorder: Secondary | ICD-10-CM

## 2014-02-26 DIAGNOSIS — R7309 Other abnormal glucose: Secondary | ICD-10-CM

## 2014-02-26 DIAGNOSIS — J45909 Unspecified asthma, uncomplicated: Secondary | ICD-10-CM

## 2014-02-26 DIAGNOSIS — R7303 Prediabetes: Secondary | ICD-10-CM | POA: Insufficient documentation

## 2014-02-26 LAB — POCT GLYCOSYLATED HEMOGLOBIN (HGB A1C): Hemoglobin A1C: 6.6

## 2014-02-26 MED ORDER — TRIAMCINOLONE ACETONIDE 0.1 % EX CREA: TOPICAL_CREAM | CUTANEOUS | Status: AC

## 2014-02-26 MED ORDER — ASPIRIN 81 MG PO TABS: 81.0000 mg | ORAL_TABLET | Freq: Every day | ORAL | Status: DC

## 2014-02-26 NOTE — Progress Notes (Signed)
CC: [name] is a 58 y.o. female is here for Follow-up   Subjective: HPI:  Followup hyperglycemia: She's been checking her blood sugar at home fasting blood sugars have been ranging from 100-105, she's taken a few postprandial blood sugars none of which are above the 130s. She's been doing calisthenics at home but unable to do cardiovascular activities outside due to the heat and her asthma. There's been no polyuria polyphasia or polydipsia. She does admit to stress eating when she is stressed out  Followup asthma: States that provided she does not go outside in the  Humidity she does not need to use her albuterol. Continues to take simple court on a daily basis. No nocturnal symptoms. Currently denies cough wheezing or shortness of breath  Complains of rash on the back of her neck has been present for the last month it is itchy, flaky, not responsive to hydrocortisone cream. Similar rash on the elbows no skin lesions elsewhere. There's been no fevers or chills.  Followup anxiety: She continues to express nervousness and anxiety regarding the possible death of dog, [name], is 24 years old and is overall in good health but the patient is anticipating the worse at any moment. Reports tearful episodes when thinking about her dog, distractions help alleviate her symptoms greatly. She continues to take citalopram she thinks is providing some benefit.   Review Of Systems Outlined In HPI  Past Medical History  Diagnosis Date  . Heart palpitations     hx of MVP  . Chest pain, atypical   . Anxiety   . IBS (irritable bowel syndrome)   . Asthma     Past Surgical History  Procedure Laterality Date  . Eab      x2    Family History  Problem Relation Age of Onset  . Heart attack Father   . Lung cancer Mother     History   Social History  . Marital Status: Divorced    Spouse Name: N/A    Number of Children: N/A  . Years of Education: N/A   Occupational History  . Not on file.    Social History Main Topics  . Smoking status: Never Smoker   . Smokeless tobacco: Not on file     Comment: nonsmoker  . Alcohol Use: No  . Drug Use: No  . Sexual Activity: Yes   Other Topics Concern  . Not on file   Social History Narrative   Divorced; grown son lives in [place].    [facility]- PCP     Objective: BP 143/79  Pulse 73  Ht 5\' 5"  (1.651 m)  Wt 199 lb (90.266 kg)  BMI 33.12 kg/m2  General: Alert and Oriented, No Acute Distress HEENT: Pupils equal, round, reactive to light. Conjunctivae clear.  Moist mucous membranes pharynx unremarkable. Lungs: Clear to auscultation bilaterally, no wheezing/ronchi/rales.  Comfortable work of breathing. Good air movement. Cardiac: Regular rate and rhythm. Normal S1/S2.  No murmurs, rubs, nor gallops. Extremities: No peripheral edema.  Strong peripheral pulses.  Mental Status: No depression, anxiety, nor agitation. Skin: Warm and dry.  She has mild to moderate eczematous changes on the date of her neck along with on the flexor surfaces of her elbows.  Assessment & Plan: [name] was seen today for follow-up.  Diagnoses and associated orders for this visit:  Hyperglycemia - POCT HgB A1C  Asthma, persistent controlled  Eczema - triamcinolone cream (KENALOG) 0.1 %; Apply to affected areas twice a day for  up to two weeks, avoid face.  Type 2 diabetes mellitus without complication - aspirin 81 MG tablet; Take 1 tablet (81 mg total) by mouth daily.  Anxiety    Hyperglycemia: A1c today 6.6 therefore I like her to start on an aspirin and continue to focus on carbohydrate prescriptions and exercise. No current need for anti-hyperglycemics Asthma: Controlled, continue Symbicort Eczema: Uncontrolled start triamcinolone cream Anxiety: Its onset this is bothering her quality of life therefore encouraged her to increase citalopram however she politely declines  Again was asked to bring in her blood pressure cuff and she  comes in for her followup visit to ensure calibration is appropriate since she states blood pressures at home are consistently below 140/90   Return in about 3 months (around 05/29/2014) for blood sugar follow up.

## 2014-03-03 ENCOUNTER — Ambulatory Visit: Payer: Self-pay | Admitting: Obstetrics & Gynecology

## 2014-03-03 ENCOUNTER — Telehealth: Payer: Self-pay | Admitting: *Deleted

## 2014-03-03 NOTE — Telephone Encounter (Signed)
Pt left a message for me to call her back but didn't say why. I called her back and I am still unsure what she wanted. She spoke of her brother having just passed away and having a rare form of leukemia. She states she had been walking and her blood sugar was 97 one morning when she checked it. I tried to redirect her and ask what it was that she needed but pt kept rambling. At the end of our conversation I told her to continue to walk and and maintain make balanced healthy choices in re the food she eats.

## 2014-03-16 ENCOUNTER — Telehealth: Payer: Self-pay | Admitting: *Deleted

## 2014-03-16 NOTE — Telephone Encounter (Signed)
Pt called and left a message that she feels some discomfort in the vaginal area when she used the bathroom this am and wanted to know what to do. Pt needs an appt. Advised pt to schedule an appt

## 2014-03-23 ENCOUNTER — Other Ambulatory Visit: Payer: Self-pay | Admitting: Family Medicine

## 2014-03-23 ENCOUNTER — Telehealth: Payer: Self-pay | Admitting: *Deleted

## 2014-03-23 ENCOUNTER — Other Ambulatory Visit: Payer: Self-pay | Admitting: *Deleted

## 2014-03-23 NOTE — Telephone Encounter (Signed)
Agree with Andrea's advice.

## 2014-03-23 NOTE — Telephone Encounter (Signed)
Pt called and back and left a message that the medication rx'ed by GYN was mimvey. It looks like it has already been sent to the pharm. Per Dr. Ileene Rubens he does not want to rx this medication to her. Called pharm and canceled rx. Left message on vm that pt should get this medication from her GYN and canceled rx at the pharm

## 2014-03-23 NOTE — Telephone Encounter (Signed)
Pt called and left a message requesting that Dr. Ileene Rubens refill a medication that is managed by another physician.pt requested that the rx be filled today. Pt states she has an appointment to see the specialist tomorrow but doesn't want to pay the copay and wants Dr. Ileene Rubens to fill the medication just this once and she would reschedule her specialist appt for a later date. I looked on her med list and there is only one medication that wasn't rx'ed by him and it was recently sent to the pharm by Dr. Barnett Abu. I left a message on pt's vm that she needed to leave a message with the name of the medication that she is requesting. I also said on the message that if a specialist is managing the medication then it is most likely best that they continue to manage whatever medication she is requesting and she keep her appt. I also stated that it may be possible that she would need an appt anyway to see Dr. Ileene Rubens if they have never discussed whatever condition the medication is for.

## 2014-03-24 ENCOUNTER — Ambulatory Visit: Payer: Self-pay | Admitting: Obstetrics & Gynecology

## 2014-03-26 ENCOUNTER — Other Ambulatory Visit: Payer: Self-pay | Admitting: Obstetrics & Gynecology

## 2014-03-26 DIAGNOSIS — Z1231 Encounter for screening mammogram for malignant neoplasm of breast: Secondary | ICD-10-CM

## 2014-04-09 ENCOUNTER — Ambulatory Visit (HOSPITAL_COMMUNITY): Payer: Self-pay

## 2014-04-23 ENCOUNTER — Ambulatory Visit (HOSPITAL_COMMUNITY): Payer: Self-pay

## 2014-04-23 ENCOUNTER — Encounter: Payer: Self-pay | Admitting: Family Medicine

## 2014-04-23 ENCOUNTER — Ambulatory Visit (INDEPENDENT_AMBULATORY_CARE_PROVIDER_SITE_OTHER): Payer: Self-pay | Admitting: Family Medicine

## 2014-04-23 VITALS — BP 158/71 | HR 85 | Wt 197.0 lb

## 2014-04-23 DIAGNOSIS — I10 Essential (primary) hypertension: Secondary | ICD-10-CM

## 2014-04-23 DIAGNOSIS — E119 Type 2 diabetes mellitus without complications: Secondary | ICD-10-CM

## 2014-04-23 DIAGNOSIS — J45909 Unspecified asthma, uncomplicated: Secondary | ICD-10-CM

## 2014-04-23 DIAGNOSIS — F411 Generalized anxiety disorder: Secondary | ICD-10-CM

## 2014-04-23 DIAGNOSIS — J4520 Mild intermittent asthma, uncomplicated: Secondary | ICD-10-CM

## 2014-04-23 DIAGNOSIS — F419 Anxiety disorder, unspecified: Secondary | ICD-10-CM

## 2014-04-23 LAB — POCT UA - MICROALBUMIN

## 2014-04-23 LAB — POCT GLYCOSYLATED HEMOGLOBIN (HGB A1C): Hemoglobin A1C: 6

## 2014-04-23 MED ORDER — LISINOPRIL 20 MG PO TABS: ORAL_TABLET | ORAL | Status: DC

## 2014-04-23 MED ORDER — ALBUTEROL SULFATE HFA 108 (90 BASE) MCG/ACT IN AERS: 2.0000 | INHALATION_SPRAY | Freq: Four times a day (QID) | RESPIRATORY_TRACT | Status: DC | PRN
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
## 2014-05-01 ENCOUNTER — Telehealth: Payer: Self-pay

## 2014-05-01 NOTE — Telephone Encounter (Signed)
Left detailed message advising patient she will need to call Dr Leggett's office for refills. I also called the pharmacy and advised them to discontinue medication. They still sent in another refill request by fax. I noted on fax refill request to discontinue medication and to contact Dr Leggett's office for additional refills.

## 2014-05-01 NOTE — Telephone Encounter (Signed)
We had this situation a few months ago (phone note 03/23/14), this was prescribed by Dr. Gala Romney and should be directed to her office given the patient's age.

## 2014-05-01 NOTE — Telephone Encounter (Signed)
[name] would like a refill on Mimvey Lo 0.5-0.1 mg. She states she needs this medication due to her hot flashes. This medication has been removed from her current medication list. Please advise.

## 2014-05-04 ENCOUNTER — Ambulatory Visit (HOSPITAL_COMMUNITY): Payer: Self-pay

## 2014-05-05 ENCOUNTER — Telehealth: Payer: Self-pay | Admitting: *Deleted

## 2014-05-05 MED ORDER — AMBULATORY NON FORMULARY MEDICATION: Status: DC

## 2014-05-05 NOTE — Telephone Encounter (Signed)
Pt called in wanting to know if there was an alternative to the Mustang that she was taking a while back. Pt states her moods are stable but hot flashes are worse and the medications OTC she has tried are not helping at all. I told pt that I would forward message to Dr. Gala Romney for her to look over and I will call pt back. Pt expressed understanding.

## 2014-05-05 NOTE — Telephone Encounter (Signed)
Pt requests a freestyle promise meter and test strips

## 2014-05-12 ENCOUNTER — Ambulatory Visit (INDEPENDENT_AMBULATORY_CARE_PROVIDER_SITE_OTHER): Payer: Self-pay | Admitting: Obstetrics & Gynecology

## 2014-05-12 ENCOUNTER — Encounter: Payer: Self-pay | Admitting: Obstetrics & Gynecology

## 2014-05-12 VITALS — BP 145/72 | HR 76 | Resp 16 | Ht 65.0 in | Wt 195.0 lb

## 2014-05-12 DIAGNOSIS — N951 Menopausal and female climacteric states: Secondary | ICD-10-CM

## 2014-05-12 MED ORDER — ESTRADIOL-NORETHINDRONE ACET 0.5-0.1 MG PO TABS: 1.0000 | ORAL_TABLET | Freq: Every day | ORAL | Status: DC

## 2014-05-12 NOTE — Progress Notes (Signed)
Pt has run out of Prosser.  She as  Been on Ht for 7 years.  She takes the pills every other day to take the edge off.  She denies any postmenopausal bleeding.  She understands the risk of breast cancer with p>7 years  HRT.  She believes the  benefits outweigh the risks and would like to continue.  We will continue for 6 months an at that point she will come in for for an exam and we will take her off.  Pt knows to notify us with PMB or other problems.   Of note, pt recently diagnosed with Type 2 DM.  Se is working hard on her diet.

## 2014-05-13 ENCOUNTER — Ambulatory Visit (HOSPITAL_COMMUNITY)
Admission: RE | Admit: 2014-05-13 | Discharge: 2014-05-13 | Disposition: A | Discharge status: Home / Self Care | Payer: Self-pay | Source: Ambulatory Visit | Attending: Obstetrics & Gynecology | Admitting: Obstetrics & Gynecology

## 2014-05-13 DIAGNOSIS — Z1231 Encounter for screening mammogram for malignant neoplasm of breast: Secondary | ICD-10-CM

## 2014-06-01 ENCOUNTER — Encounter: Payer: Self-pay | Admitting: Obstetrics & Gynecology

## 2014-06-09 ENCOUNTER — Telehealth: Payer: Self-pay | Admitting: *Deleted

## 2014-06-09 NOTE — Telephone Encounter (Signed)
Pt left a message that she is having hot flashes and sweating profusely. She states she is back on the "hormone pill." On the message she states she wants to let Dr. Ileene Rubens know this. I called he back and left a message that she needs to call and discuss this with Dr. Barnett Abu since she is managing the hormone pills

## 2014-06-10 ENCOUNTER — Ambulatory Visit: Payer: Self-pay | Admitting: Family Medicine

## 2014-07-14 ENCOUNTER — Telehealth: Payer: Self-pay | Admitting: *Deleted

## 2014-07-14 NOTE — Telephone Encounter (Signed)
[place] application form [phone]. Copy scanned into chart. orig mailed to pt.

## 2014-07-22 ENCOUNTER — Other Ambulatory Visit: Payer: Self-pay | Admitting: Family Medicine

## 2014-08-20 ENCOUNTER — Telehealth: Payer: Self-pay | Admitting: *Deleted

## 2014-08-20 NOTE — Telephone Encounter (Signed)
Pt called and left another message on my vm stating that DR. Hommel is aware of what she is talking about if I would just tell him then he would gladly call her back. Pt states that she know that with family situation he would know what she is talking about and would call her in something for her nerves. She states she cannot leave the house to come in for an appt because she has a "sick family member." Last refill of citalopram was sent in with a message to the pharmacy to be place on rx that pt needs f/u appt. (see previous phone note)

## 2014-08-20 NOTE — Telephone Encounter (Signed)
Pt left a message asking for Dr. Ileene Rubens to call her back in re to "something they had been conversing back and forth about." she stated in her message that he would know what she was talking about. Left a message on her vm that if there wasn't anything that I could help her with to schedule an appt. I said on the message that Dr. Ileene Rubens typically doesn't call pt's back to discuss matters over the phone and I didn't see where they had been conversing back and forth in the chart. Last thing documented in her chart was 12/15.

## 2014-08-24 NOTE — Telephone Encounter (Signed)
This matter would require an office visit for further evaluation.

## 2014-09-21 ENCOUNTER — Ambulatory Visit: Payer: Self-pay | Admitting: Family Medicine

## 2014-09-23 ENCOUNTER — Encounter: Payer: Self-pay | Admitting: Family Medicine

## 2014-09-23 ENCOUNTER — Telehealth: Payer: Self-pay | Admitting: *Deleted

## 2014-09-23 ENCOUNTER — Telehealth: Payer: Self-pay | Admitting: Family Medicine

## 2014-09-23 ENCOUNTER — Ambulatory Visit (INDEPENDENT_AMBULATORY_CARE_PROVIDER_SITE_OTHER): Payer: Self-pay | Admitting: Family Medicine

## 2014-09-23 VITALS — BP 146/72 | HR 84 | Wt 189.0 lb

## 2014-09-23 DIAGNOSIS — F419 Anxiety disorder, unspecified: Secondary | ICD-10-CM

## 2014-09-23 DIAGNOSIS — I10 Essential (primary) hypertension: Secondary | ICD-10-CM

## 2014-09-23 MED ORDER — VENLAFAXINE HCL 50 MG PO TABS: 50.0000 mg | ORAL_TABLET | Freq: Two times a day (BID) | ORAL | Status: DC

## 2014-09-23 MED ORDER — VENLAFAXINE HCL ER 150 MG PO CP24: 150.0000 mg | ORAL_CAPSULE | Freq: Every day | ORAL | Status: DC

## 2014-09-23 NOTE — Telephone Encounter (Signed)
Pt left a message stating the medication that was just rx'd today( effexor ) was too expensive. She states she would like to go with the second choice that you all discussed today.

## 2014-09-23 NOTE — Telephone Encounter (Signed)
Pt was able to get the orig rx at [facility] for [facility]17

## 2014-09-23 NOTE — Telephone Encounter (Signed)
Immediate release form sent to her pharmacy.

## 2014-09-23 NOTE — Telephone Encounter (Signed)
Pt called. [facility] on the cost of her script. Any questions see me.  Thank you.

## 2014-09-23 NOTE — Progress Notes (Signed)
CC: [name] is a 59 y.o. female is here for Anxiety   Subjective: HPI:  Follow-up anxiety: Continues on citalopram 10 mg on a daily basis despite this her subjective anxiety has worsened now severe in severity. Symptoms have been worsened by the recent heart attack of her dog and a unknown life expectancy now for her. Patient has felt that she is unable to leave the house because she needs to spend every minute next to her dog because it could be to last. Binge eating seems to help some of the anxiety such as eating many candy bars once or whole pizza to herself. She reports that she is much more irritable lately. Symptoms are also worsened by her son not really caring about the dog. She denies any specific fear other than the death of her dog. She denies paranoia. She is uncertain about depression but does report that she's had changing her eating habits and change in sleeping habits.  Follow-up essential hypertension: Continues to take lisinopril on a daily basis without cough or known side effects. Psychosocial history report. She denies watching her sodium intake.   Review Of Systems Outlined In HPI  Past Medical History  Diagnosis Date  . Heart palpitations     hx of MVP  . Chest pain, atypical   . Anxiety   . IBS (irritable bowel syndrome)   . Asthma     Past Surgical History  Procedure Laterality Date  . Eab      x2    Family History  Problem Relation Age of Onset  . Heart attack Father   . Lung cancer Mother     History   Social History  . Marital Status: Divorced    Spouse Name: N/A  . Number of Children: N/A  . Years of Education: N/A   Occupational History  . Not on file.   Social History Main Topics  . Smoking status: Never Smoker   . Smokeless tobacco: Not on file     Comment: nonsmoker  . Alcohol Use: No  . Drug Use: No  . Sexual Activity: Yes   Other Topics Concern  . Not on file   Social History Narrative   Divorced; grown son lives in  [place].    [facility]- PCP     Objective: BP 146/72 mmHg  Pulse 84  Wt 189 lb (85.73 kg)  General: Alert and Oriented, No Acute Distress HEENT: Pupils equal, round, reactive to light. Conjunctivae clear.  Moist mucous membranes Lungs: Clear to auscultation bilaterally, no wheezing/ronchi/rales.  Comfortable work of breathing. Good air movement. Cardiac: Regular rate and rhythm. Normal S1/S2.  No murmurs, rubs, nor gallops.   Extremities: No peripheral edema.  Strong peripheral pulses.  Mental Status: Mild depression moderate anxiety no agitation. Tearful Skin: Warm and dry.  Assessment & Plan: [name] was seen today for anxiety.  Diagnoses and all orders for this visit:  Anxiety Orders: -     venlafaxine XR (EFFEXOR XR) 150 MG 24 hr capsule; Take 1 capsule (150 mg total) by mouth daily with breakfast. -     Ambulatory referral to Psychology  Essential hypertension   Anxiety: Uncontrolled chronic condition stop citalopram and begin Effexor, if the extended release version is not affordable we can always do the twice a day immediate release dosing Essential hypertension: Uncontrolled, it sounds like there is a large room for improvement here with her sodium intake. Discussed reducing sodium intake and continuing lisinopril recheck in one  month  Return in about 4 weeks (around 10/21/2014).

## 2014-09-29 ENCOUNTER — Telehealth: Payer: Self-pay | Admitting: *Deleted

## 2014-09-29 MED ORDER — PAROXETINE HCL 20 MG PO TABS: 20.0000 mg | ORAL_TABLET | Freq: Every day | ORAL | Status: DC

## 2014-09-29 NOTE — Telephone Encounter (Signed)
Pt called and states the medication rx'ed makes her sleepy and "groggy" Chartered loss adjuster). Wants to know if there is something else she can take

## 2014-09-29 NOTE — Telephone Encounter (Signed)
Pt called and states she has broken out in hives from the effexor and she is going to take her citalopram. She states she is tired of being "doped up and wasted $18 and cannot work" because she has slurred speech. She states she is disappointed and will just keep doing what she is doing

## 2014-09-29 NOTE — Telephone Encounter (Signed)
Another option would be generic Paxil which [facility].  If it is not [facility] the [facility] website lists this as being on the $4 list.

## 2014-09-30 NOTE — Telephone Encounter (Signed)
Pt notified and she does not want to try anything else

## 2014-10-01 ENCOUNTER — Telehealth: Payer: Self-pay | Admitting: *Deleted

## 2014-10-01 NOTE — Telephone Encounter (Signed)
Pt wants to know if can try Effexor 75 mg....she thinks that will have less side effects

## 2014-10-01 NOTE — Telephone Encounter (Signed)
Pt.notified

## 2014-10-01 NOTE — Telephone Encounter (Signed)
I would advise against this given her reaction to the 50mg  formulation.

## 2014-10-17 ENCOUNTER — Other Ambulatory Visit: Payer: Self-pay | Admitting: Family Medicine

## 2014-12-09 ENCOUNTER — Other Ambulatory Visit: Payer: Self-pay | Admitting: Family Medicine

## 2015-01-04 ENCOUNTER — Other Ambulatory Visit: Payer: Self-pay | Admitting: Family Medicine

## 2015-01-05 ENCOUNTER — Telehealth: Payer: Self-pay | Admitting: Family Medicine

## 2015-01-05 NOTE — Telephone Encounter (Signed)
Seth Bake, [place] application complete and in your inbox ready for faxing.

## 2015-01-06 ENCOUNTER — Other Ambulatory Visit: Payer: Self-pay | Admitting: *Deleted

## 2015-01-06 DIAGNOSIS — J45909 Unspecified asthma, uncomplicated: Secondary | ICD-10-CM

## 2015-01-06 MED ORDER — BUDESONIDE-FORMOTEROL FUMARATE 160-4.5 MCG/ACT IN AERO: 2.0000 | INHALATION_SPRAY | Freq: Two times a day (BID) | RESPIRATORY_TRACT | Status: DC

## 2015-01-12 ENCOUNTER — Other Ambulatory Visit: Payer: Self-pay | Admitting: *Deleted

## 2015-01-12 MED ORDER — CITALOPRAM HYDROBROMIDE 10 MG PO TABS: 10.0000 mg | ORAL_TABLET | Freq: Every day | ORAL | Status: DC

## 2015-01-15 ENCOUNTER — Ambulatory Visit (INDEPENDENT_AMBULATORY_CARE_PROVIDER_SITE_OTHER): Payer: Self-pay | Admitting: Family Medicine

## 2015-01-15 ENCOUNTER — Encounter: Payer: Self-pay | Admitting: Family Medicine

## 2015-01-15 VITALS — BP 144/78 | HR 86 | Ht 65.0 in | Wt 187.0 lb

## 2015-01-15 DIAGNOSIS — F419 Anxiety disorder, unspecified: Secondary | ICD-10-CM

## 2015-01-15 MED ORDER — HYDROXYZINE HCL 25 MG PO TABS: 25.0000 mg | ORAL_TABLET | Freq: Three times a day (TID) | ORAL | Status: DC | PRN

## 2015-01-15 NOTE — Progress Notes (Signed)
CC: [name] is a 59 y.o. female is here for Grief   Subjective: HPI:  4 days ago her dog passed away. Since then she's been missing this dog and has been quite emotional when thinking about the loss patent. Thinking about the animal causes her to feel anxious, nervous, All at the same time. She feels that anxiety and nervousness seems to be the most overwhelming symptoms. Symptoms are slightly improved if she distracts herself with cleaning. Nothing else seems to make symptoms better or worse. They can occur anytime of the day and sometimes wake her up at night. She's been seeking out help with her pastor which is helping mildly. Symptoms overall are moderate in severity. She denies unintentional weight loss or unintentional weight gain. No thoughts when himself or others. She's had some decrease in appetite. She denies any other gastrointestinal complaints   Review Of Systems Outlined In HPI  Past Medical History  Diagnosis Date  . Heart palpitations     hx of MVP  . Chest pain, atypical   . Anxiety   . IBS (irritable bowel syndrome)   . Asthma     Past Surgical History  Procedure Laterality Date  . Eab      x2    Family History  Problem Relation Age of Onset  . Heart attack Father   . Lung cancer Mother     History   Social History  . Marital Status: Divorced    Spouse Name: N/A  . Number of Children: N/A  . Years of Education: N/A   Occupational History  . Not on file.   Social History Main Topics  . Smoking status: Never Smoker   . Smokeless tobacco: Not on file     Comment: nonsmoker  . Alcohol Use: No  . Drug Use: No  . Sexual Activity: Yes   Other Topics Concern  . Not on file   Social History Narrative   Divorced; grown son lives in [place].    [facility]- PCP     Objective: BP 144/78 mmHg  Pulse 86  Ht 5\' 5"  (1.651 m)  Wt 187 lb (84.823 kg)  BMI 31.12 kg/m2  Vital signs reviewed. General: Alert and Oriented, No Acute  Distress HEENT: Pupils equal, round, reactive to light. Conjunctivae clear.  External ears unremarkable.  Moist mucous membranes. Lungs: Clear and comfortable work of breathing, speaking in full sentences without accessory muscle use. Cardiac: Regular rate and rhythm.  Neuro: CN II-XII grossly intact, gait normal. Extremities: No peripheral edema.  Strong peripheral pulses.  Mental Status: Tearful, mild depression, moderate anxiety and nervousness. Logical though process. Skin: Warm and dry.  Assessment & Plan: [name] was seen today for grief.  Diagnoses and all orders for this visit:  Anxiety Orders: -     hydrOXYzine (ATARAX/VISTARIL) 25 MG tablet; Take 1 tablet (25 mg total) by mouth 3 (three) times daily as needed (for anxiety or grief).   Anxiety: Start as needed hydroxyzine only when anxiety or nervousness symptoms feel overwhelming. Discussed sedation warning. Discussed that this should not be used for any depressive symptoms. Continues he pastor for spiritual and psychological support.  Return if symptoms worsen or fail to improve. 25 minutes spent face-to-face during visit today of which at least 50% was counseling or coordinating care regarding: 1. Anxiety

## 2015-01-19 ENCOUNTER — Ambulatory Visit: Payer: Self-pay | Admitting: Family Medicine

## 2015-01-20 ENCOUNTER — Ambulatory Visit: Payer: Self-pay | Admitting: Family Medicine

## 2015-02-09 ENCOUNTER — Other Ambulatory Visit: Payer: Self-pay | Admitting: Family Medicine

## 2015-03-03 ENCOUNTER — Telehealth: Payer: Self-pay | Admitting: *Deleted

## 2015-03-03 MED ORDER — AMBULATORY NON FORMULARY MEDICATION: Status: DC

## 2015-03-03 NOTE — Telephone Encounter (Signed)
Test strips

## 2015-03-08 ENCOUNTER — Other Ambulatory Visit: Payer: Self-pay | Admitting: Family Medicine

## 2015-03-08 ENCOUNTER — Ambulatory Visit: Payer: Self-pay | Admitting: Family Medicine

## 2015-03-15 ENCOUNTER — Ambulatory Visit (INDEPENDENT_AMBULATORY_CARE_PROVIDER_SITE_OTHER): Payer: Self-pay | Admitting: Family Medicine

## 2015-03-15 ENCOUNTER — Encounter: Payer: Self-pay | Admitting: Family Medicine

## 2015-03-15 VITALS — BP 135/71 | HR 81 | Ht 65.0 in | Wt 194.0 lb

## 2015-03-15 DIAGNOSIS — F419 Anxiety disorder, unspecified: Secondary | ICD-10-CM

## 2015-03-15 DIAGNOSIS — E119 Type 2 diabetes mellitus without complications: Secondary | ICD-10-CM

## 2015-03-15 DIAGNOSIS — I10 Essential (primary) hypertension: Secondary | ICD-10-CM

## 2015-03-15 LAB — POCT GLYCOSYLATED HEMOGLOBIN (HGB A1C): HEMOGLOBIN A1C: 6.2

## 2015-03-15 MED ORDER — HYDROXYZINE HCL 25 MG PO TABS: 25.0000 mg | ORAL_TABLET | Freq: Three times a day (TID) | ORAL | Status: DC | PRN

## 2015-03-15 MED ORDER — CITALOPRAM HYDROBROMIDE 10 MG PO TABS: 10.0000 mg | ORAL_TABLET | Freq: Every day | ORAL | Status: DC

## 2015-03-15 NOTE — Progress Notes (Signed)
CC: [name] is a 59 y.o. female is here for Diabetes and Depression   Subjective: HPI:  Follow-up type 2 diabetes: No outside blood sugars report. She's exercising 10 minutes a day inside her house. Unable to walk outside due to the heat. Denies polyuria polyphagia or polydipsia.   Follow-up essential hypertension: Continues on lisinopril on a daily basis. No outside blood pressures report. No chest pain shortness of breath orthopnea nor peripheral edema   follow-up anxiety: She tells me that her anxiety has greatly improved with the use of hydroxyzine.she denies any known side effects. Symptoms are worse when she thinks about her recent dog passing away and her son's repeated behaviors of disrespect. Continues on citalopram daily requesting a refill. No thoughts of wanting to harm self or others. Denies any depression.     Review Of Systems Outlined In HPI  Past Medical History  Diagnosis Date  . Heart palpitations     hx of MVP  . Chest pain, atypical   . Anxiety   . IBS (irritable bowel syndrome)   . Asthma     Past Surgical History  Procedure Laterality Date  . Eab      x2    Family History  Problem Relation Age of Onset  . Heart attack Father   . Lung cancer Mother     Social History   Social History  . Marital Status: Divorced    Spouse Name: N/A  . Number of Children: N/A  . Years of Education: N/A   Occupational History  . Not on file.   Social History Main Topics  . Smoking status: Never Smoker   . Smokeless tobacco: Not on file     Comment: nonsmoker  . Alcohol Use: No  . Drug Use: No  . Sexual Activity: Yes   Other Topics Concern  . Not on file   Social History Narrative   Divorced; grown son lives in [place].    [facility]- PCP     Objective: BP 135/71 mmHg  Pulse 81  Ht 5\' 5"  (1.651 m)  Wt 194 lb (87.998 kg)  BMI 32.28 kg/m2  General: Alert and Oriented, No Acute Distress HEENT: Pupils equal, round, reactive to light.  Conjunctivae clearMoist mucous membranes Lungs: Clear to auscultation bilaterally, no wheezing/ronchi/rales.  Comfortable work of breathing. Good air movement. Cardiac: Regular rate and rhythm. Normal S1/S2.  No murmurs, rubs, nor gallops.   Extremities: No peripheral edema.  Strong peripheral pulses.  Mental Status: No depression, anxiety, nor agitation. Skin: Warm and dry.  Assessment & Plan: [name] was seen today for diabetes and depression.  Diagnoses and all orders for this visit:  Type 2 diabetes mellitus without complication -     POCT HgB A1C  Essential hypertension  Anxiety -     citalopram (CELEXA) 10 MG tablet; Take 1 tablet (10 mg total) by mouth daily. -     hydrOXYzine (ATARAX/VISTARIL) 25 MG tablet; Take 1 tablet (25 mg total) by mouth 3 (three) times daily as needed (for anxiety or grief).    type 2 diabetes: A1c of 6.2, controlled, continue to strive for 30 minutes of exercise most days of the week. No current need for anti-hyperglycemics essential hypertension: Controlled continue lisinopril Anxiety: Controlled continue citalopram and as needed hydroxyzine.  Return in about 3 months (around 06/15/2015) for blood sugar.

## 2015-03-31 ENCOUNTER — Other Ambulatory Visit: Payer: Self-pay | Admitting: Family Medicine

## 2015-06-18 ENCOUNTER — Encounter: Payer: Self-pay | Admitting: Physician Assistant

## 2015-06-18 ENCOUNTER — Telehealth: Payer: Self-pay | Admitting: Emergency Medicine

## 2015-06-18 ENCOUNTER — Ambulatory Visit (INDEPENDENT_AMBULATORY_CARE_PROVIDER_SITE_OTHER): Payer: Self-pay | Admitting: Physician Assistant

## 2015-06-18 VITALS — BP 152/78 | HR 77 | Ht 65.0 in | Wt 200.0 lb

## 2015-06-18 DIAGNOSIS — G44209 Tension-type headache, unspecified, not intractable: Secondary | ICD-10-CM

## 2015-06-18 DIAGNOSIS — IMO0001 Reserved for inherently not codable concepts without codable children: Secondary | ICD-10-CM | POA: Insufficient documentation

## 2015-06-18 DIAGNOSIS — R030 Elevated blood-pressure reading, without diagnosis of hypertension: Secondary | ICD-10-CM

## 2015-06-18 DIAGNOSIS — E119 Type 2 diabetes mellitus without complications: Secondary | ICD-10-CM

## 2015-06-18 NOTE — Patient Instructions (Signed)
Ibuprofen 400mg .  REST and hydrate.    General Headache Without Cause A headache is pain or discomfort felt around the head or neck area. The specific cause of a headache may not be found. There are many causes and types of headaches. A few common ones are:  Tension headaches.  Migraine headaches.  Cluster headaches.  Chronic daily headaches. HOME CARE INSTRUCTIONS  Watch your condition for any changes. Take these steps to help with your condition: Managing Pain  Take over-the-counter and prescription medicines only as told by your health care provider.  Lie down in a dark, quiet room when you have a headache.  If directed, apply ice to the head and neck area:  Put ice in a plastic bag.  Place a towel between your skin and the bag.  Leave the ice on for 20 minutes, 2-3 times per day.  Use a heating pad or hot shower to apply heat to the head and neck area as told by your health care provider.  Keep lights dim if bright lights bother you or make your headaches worse. Eating and Drinking  Eat meals on a regular schedule.  Limit alcohol use.  Decrease the amount of caffeine you drink, or stop drinking caffeine. General Instructions  Keep all follow-up visits as told by your health care provider. This is important.  Keep a headache journal to help find out what may trigger your headaches. For example, write down:  What you eat and drink.  How much sleep you get.  Any change to your diet or medicines.  Try massage or other relaxation techniques.  Limit stress.  Sit up straight, and do not tense your muscles.  Do not use tobacco products, including cigarettes, chewing tobacco, or e-cigarettes. If you need help quitting, ask your health care provider.  Exercise regularly as told by your health care provider.  Sleep on a regular schedule. Get 7-9 hours of sleep, or the amount recommended by your health care provider. SEEK MEDICAL CARE IF:   Your symptoms are  not helped by medicine.  You have a headache that is different from the usual headache.  You have nausea or you vomit.  You have a fever. SEEK IMMEDIATE MEDICAL CARE IF:   Your headache becomes severe.  You have repeated vomiting.  You have a stiff neck.  You have a loss of vision.  You have problems with speech.  You have pain in the eye or ear.  You have muscular weakness or loss of muscle control.  You lose your balance or have trouble walking.  You feel faint or pass out.  You have confusion.   This information is not intended to replace advice given to you by your health care provider. Make sure you discuss any questions you have with your health care provider.   Document Released: 07/17/2005 Document Revised: 04/07/2015 Document Reviewed: 11/09/2014 Elsevier Interactive Patient Education [employer].

## 2015-06-18 NOTE — Progress Notes (Signed)
Subjective:    Patient ID: [name], female    DOB: Oct 19, 1955, 59 y.o.   MRN: [id number]  HPI  Pt presents to the clinic with a headache that she woke up with this morning. Describes headache as pounding and located on top of her head. Denies any vision changes or auras. Some nausea but no vomiting. Not light or sound sensitive. She did take her baby ASA 81mg  this am. Headache is 45 percent better now than this morning. No speech changes, no limb weakness, no gait changes. She talks about a lot of stress this week. She has been crying a lot about her dog that died in the summer. She is reflecting about her brothers death as well. 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 She also had a church event this week that she was supposed to have a leadership role in and just couldn't do it. She drank 4-6 cups of caffiene Wednesday night to build up her courage to go to mtg at church. Did not end up going and her heart had palpitations for next day. No caffeine since Wednesday and used to having caffeine daily at least one cup. She does also complain of neck stiffness. She thinks she might of slept wrong on it.   .. Active Ambulatory Problems    Diagnosis Date Noted  . Chest pain 05/01/2012  . Itchy scalp 05/01/2012  . Anxiety 05/01/2012  . Essential hypertension 08/23/2012  . Symptomatic menopausal or female climacteric states 01/30/2013  . Hyperglycemia 12/31/2013  . Hyperlipidemia 12/31/2013  . Asthma, persistent controlled 02/26/2014  . Eczema 02/26/2014  . Type 2 diabetes mellitus without complication ([place]) 0000000  . Menopausal symptom 05/12/2014  . Elevated blood pressure 06/18/2015   Resolved Ambulatory Problems    Diagnosis Date Noted  . No Resolved Ambulatory Problems   Past Medical History  Diagnosis Date  . Heart palpitations   . Chest pain, atypical   . IBS (irritable bowel syndrome)   . Asthma    .[email or web address] [email or web address].fa   Review of Systems  All  other systems reviewed and are negative.      Objective:   Physical Exam  Constitutional: She is oriented to person, place, and time. She appears well-developed and well-nourished.  HENT:  Head: Normocephalic and atraumatic.  Right Ear: External ear normal.  Left Ear: External ear normal.  Nose: Nose normal.  Mouth/Throat: Oropharynx is clear and moist. No oropharyngeal exudate.  Negative for any maxillary sinus tenderness to palpation.   Eyes: Conjunctivae and EOM are normal. Pupils are equal, round, and reactive to light.  Neck: Normal range of motion. Neck supple.  Pulmonary/Chest: Effort normal and breath sounds normal. She has no wheezes.  Musculoskeletal:  Bilateral upper and lower extremities strength 5/5.   Lymphadenopathy:    She has no cervical adenopathy.  Neurological: She is alert and oriented to person, place, and time. She has normal reflexes. No cranial nerve deficit. Coordination normal.  Psychiatric: She has a normal mood and affect. Her behavior is normal.          Assessment & Plan:  Tension type headache- reassured no signs of stroke. offered toradol shot in office today. She declined since she was 45 percent better already today. Discussed taking ibuprofen 400mg , rest, hydration and consider 1 cup of caffeine since she is used to that. Some of the headache could be due to caffeine withdrawal. Recommended a massage for her neck and good supportive  pillow. Encouraged lavender oils on neck and relaxing bath. I do think there is a anxiety component. She is on celexa and not interested in changing dose. She takes vistaril when needed but puts her to sleep. Red flag signs discussed.   Elevated blood pressure- she is not taking lisinopril stating that she discussed with Dr. Ileene Rubens and did not want to be "dependent" on this medication. Please follow up with PCP in 1 week. Reports lower BP when she checks at home around 130's/80's.   DM, type II- wanted me to run an  A!C. I politely discussed how PCP needs to do this. Please schedule with him.   Follow up with PCP for BP recheck and DM follow up.

## 2015-06-21 ENCOUNTER — Encounter: Payer: Self-pay | Admitting: Family Medicine

## 2015-06-21 ENCOUNTER — Ambulatory Visit: Payer: Self-pay | Admitting: Family Medicine

## 2015-06-21 NOTE — Progress Notes (Signed)
No show today

## 2015-06-23 ENCOUNTER — Telehealth: Payer: Self-pay | Admitting: Family Medicine

## 2015-06-23 NOTE — Telephone Encounter (Signed)
Will you please let patient know that received a patient prescription assistance form that I've completed  However she still needs to fill out some info on it.  We can fax it once she completes it. (your in box)

## 2015-06-28 NOTE — Telephone Encounter (Signed)
Pt notified and request that forms be mailed to her address.

## 2015-07-13 ENCOUNTER — Encounter: Payer: Self-pay | Admitting: Family Medicine

## 2015-07-13 ENCOUNTER — Ambulatory Visit (INDEPENDENT_AMBULATORY_CARE_PROVIDER_SITE_OTHER): Payer: Self-pay | Admitting: Family Medicine

## 2015-07-13 VITALS — BP 142/77 | HR 83

## 2015-07-13 DIAGNOSIS — W19XXXA Unspecified fall, initial encounter: Secondary | ICD-10-CM

## 2015-07-13 DIAGNOSIS — E119 Type 2 diabetes mellitus without complications: Secondary | ICD-10-CM

## 2015-07-13 LAB — POCT GLYCOSYLATED HEMOGLOBIN (HGB A1C): HEMOGLOBIN A1C: 6.3

## 2015-07-13 NOTE — Addendum Note (Signed)
Addended by: Delrae Alfred on: 07/13/2015 02:20 PM   Modules accepted: Orders

## 2015-07-13 NOTE — Progress Notes (Signed)
CC: [name] is a 59 y.o. female is here for Fall   Subjective: HPI:  Follow-up type 2 diabetes: Currently not on any anti-hyperglycemics. She is not following any particular diet. She is not optimistic that her A1c will look good today. She denies polyuria or polyphagia or polydipsia. She's been eating pies and not curbing her copper hydrate intake.  She had a fall 2 days ago she fell into a brick floor from a standing position after missing a stair. She hit her head and had immediate pain in her head and right elbow and right thigh. Pain is significantly better today she's not been taking any medication to dull the pain. She had a headache the day of the accident but it went away the next morning. She denies any motor or sensory disturbances. She is worried she might have broken her right arm. No interventions as of yet. She denies weakness but does have some pain in the forearm when typing. Nothing else makes symptoms better or worse.   Review Of Systems Outlined In HPI  Past Medical History  Diagnosis Date  . Heart palpitations     hx of MVP  . Chest pain, atypical   . Anxiety   . IBS (irritable bowel syndrome)   . Asthma     Past Surgical History  Procedure Laterality Date  . Eab      x2    Family History  Problem Relation Age of Onset  . Heart attack Father   . Lung cancer Mother     Social History   Social History  . Marital Status: Divorced    Spouse Name: N/A  . Number of Children: N/A  . Years of Education: N/A   Occupational History  . Not on file.   Social History Main Topics  . Smoking status: Never Smoker   . Smokeless tobacco: Not on file     Comment: nonsmoker  . Alcohol Use: No  . Drug Use: No  . Sexual Activity: Yes   Other Topics Concern  . Not on file   Social History Narrative   Divorced; grown son lives in [place].    [facility]- PCP     Objective: BP 142/77 mmHg  Pulse 83  Wt   Vital signs reviewed. General: Alert  and Oriented, No Acute Distress HEENT: Pupils equal, round, reactive to light. Conjunctivae clear.  External ears unremarkable.  Moist mucous membranes. Lungs: Clear and comfortable work of breathing, speaking in full sentences without accessory muscle use. Cardiac: Regular rate and rhythm.  Neuro: CN II-XII grossly intact, gait normal. Extremities: No peripheral edema.  Strong peripheral pulses.  Right dorsal forearm has moderate to mild patches of ecchymosis. No pain in the elbow with palpation of the olecranon. No pain in the right elbow with resisted supination and pronation. No pain in the right elbow with compression of the ulna and radius. Mental Status: No depression, anxiety, nor agitation. Logical though process. Skin: Warm and dry.  Assessment & Plan: [name] was seen today for fall.  Diagnoses and all orders for this visit:  Type 2 diabetes mellitus without complication, without long-term current use of insulin ([place])  Fall, initial encounter   Fall: Reassurance provided that I do not think that she broke her right forearm or elbow. I've offered her something for pain to help her with falling asleep but she politely declines. Possible that she suffered a mild concussion on the day of the accident however she is  not showing any signs of postconcussive syndrome now   type 2 diabetes: A1c of 6.3, control, urged to her carbohydrate intake to maintain this or achieve an even lower A1c.  Return if symptoms worsen or fail to improve.

## 2015-08-12 ENCOUNTER — Encounter: Payer: Self-pay | Admitting: Family Medicine

## 2015-08-12 ENCOUNTER — Telehealth: Payer: Self-pay

## 2015-08-12 NOTE — Telephone Encounter (Signed)
Ok, letter in your in box.

## 2015-08-12 NOTE — Telephone Encounter (Signed)
Pt is asking for a letter explaining that her new puppy is a emotional support dog.

## 2015-08-13 NOTE — Telephone Encounter (Signed)
Pt.notified

## 2015-08-26 ENCOUNTER — Telehealth: Payer: Self-pay

## 2015-08-26 DIAGNOSIS — R197 Diarrhea, unspecified: Secondary | ICD-10-CM

## 2015-08-26 NOTE — Telephone Encounter (Signed)
Good idea, i'll print off a lab slip

## 2015-08-26 NOTE — Telephone Encounter (Signed)
Pt reports for the past week she has had diarrhea and is worried that her puppy may have given her worms.  She stated that she did increase fiber in her diet and she is also taking liquid chlorophyll. She is asking would you like for her to drop off a stool sample?

## 2015-08-26 NOTE — Telephone Encounter (Signed)
Pt notified.  Pt stated that she is feeling a littler better.

## 2015-09-01 ENCOUNTER — Other Ambulatory Visit: Payer: Self-pay | Admitting: Obstetrics & Gynecology

## 2015-09-01 DIAGNOSIS — Z1231 Encounter for screening mammogram for malignant neoplasm of breast: Secondary | ICD-10-CM

## 2015-09-07 ENCOUNTER — Telehealth: Payer: Self-pay

## 2015-09-07 NOTE — Telephone Encounter (Signed)
Pt reports that she has taken all of her vitamins (supper b complex, calcium and garlic) alone with her citalopram this morning.  She now has hives on her right arm.  Is this reaction from her taken all these medications together?

## 2015-09-07 NOTE — Telephone Encounter (Signed)
If they are the same substances that she's been taking I would think that the hives are not due these vitamins and medications.  It's more likely that her arm came in contact with something she's allergic to.  If she hasn't already taken 50mg  of benadryl I'd recommend doing this.

## 2015-09-07 NOTE — Telephone Encounter (Signed)
Detailed vm left for pt with instructions.

## 2015-09-20 ENCOUNTER — Ambulatory Visit: Payer: Self-pay | Admitting: Family Medicine

## 2015-09-23 ENCOUNTER — Other Ambulatory Visit: Payer: Self-pay | Admitting: Family Medicine

## 2015-09-27 ENCOUNTER — Ambulatory Visit: Payer: Self-pay | Admitting: Family Medicine

## 2015-09-28 ENCOUNTER — Ambulatory Visit (INDEPENDENT_AMBULATORY_CARE_PROVIDER_SITE_OTHER): Payer: Self-pay | Admitting: Family Medicine

## 2015-09-28 ENCOUNTER — Encounter: Payer: Self-pay | Admitting: Family Medicine

## 2015-09-28 DIAGNOSIS — G4700 Insomnia, unspecified: Secondary | ICD-10-CM | POA: Insufficient documentation

## 2015-09-28 DIAGNOSIS — L03119 Cellulitis of unspecified part of limb: Secondary | ICD-10-CM

## 2015-09-28 DIAGNOSIS — E119 Type 2 diabetes mellitus without complications: Secondary | ICD-10-CM

## 2015-09-28 DIAGNOSIS — B372 Candidiasis of skin and nail: Secondary | ICD-10-CM

## 2015-09-28 LAB — POCT GLYCOSYLATED HEMOGLOBIN (HGB A1C): Hemoglobin A1C: 6.3

## 2015-09-28 MED ORDER — CLINDAMYCIN HCL 300 MG PO CAPS
300.0000 mg | ORAL_CAPSULE | Freq: Three times a day (TID) | ORAL | Status: DC
Start: 2015-09-28 — End: 2015-10-22

## 2015-09-28 MED ORDER — CLOTRIMAZOLE-BETAMETHASONE 1-0.05 % EX CREA: TOPICAL_CREAM | CUTANEOUS | Status: AC

## 2015-09-28 NOTE — Progress Notes (Signed)
CC: [name] is a 60 y.o. female is here for Hyperglycemia   Subjective: HPI:  Follow-up type 2 diabetes: She staying active playing with her dog on a daily basis. No outside blood sugars to report. Not currently taking any blood sugar medication. She denies any polyuria polyphagia polydipsia nor vision loss. She does have some poorly healing wounds on her shins and wrists. She is not sure how she got them. They've not been healing despite using over-the-counter antibiotic ointment. She's had them for about 2 weeks. She does report occasional mild fever and chill sensation but no objective temperatures over the past 2 weeks.  Complains of redness and itching in the axillary region. All this started after she started a new type of deodorant. She denies any skin complaints other than these described above. She's unsure how long exactly she's had the itching in her armpits.     Review Of Systems Outlined In HPI  Past Medical History  Diagnosis Date  . Heart palpitations     hx of MVP  . Chest pain, atypical   . Anxiety   . IBS (irritable bowel syndrome)   . Asthma     Past Surgical History  Procedure Laterality Date  . Eab      x2    Family History  Problem Relation Age of Onset  . Heart attack Father   . Lung cancer Mother     Social History   Social History  . Marital Status: Divorced    Spouse Name: N/A  . Number of Children: N/A  . Years of Education: N/A   Occupational History  . Not on file.   Social History Main Topics  . Smoking status: Never Smoker   . Smokeless tobacco: Not on file     Comment: nonsmoker  . Alcohol Use: No  . Drug Use: No  . Sexual Activity: Yes   Other Topics Concern  . Not on file   Social History Narrative   Divorced; grown son lives in [place].    [facility]- PCP     Objective: BP 142/84 mmHg  Pulse 74  Wt 200 lb (90.719 kg)  General: Alert and Oriented, No Acute Distress HEENT: Pupils equal, round, reactive  to light. Conjunctivae clear.  Moist mucous membranes Lungs: Clear to auscultation bilaterally, no wheezing/ronchi/rales.  Comfortable work of breathing. Good air movement. Cardiac: Regular rate and rhythm. Normal S1/S2.  No murmurs, rubs, nor gallops.   Extremities: No peripheral edema.  Strong peripheral pulses.  Mental Status: No depression, anxiety, nor agitation. Skin: Warm and dry.2 mm break in the skin with one center surrounding erythema without induration or warmth. There is a lesion with disappearance on both wrists and the right shin. Erythematous patch in the axillary regions  Assessment & Plan: [name] was seen today for hyperglycemia.  Diagnoses and all orders for this visit:  Type 2 diabetes mellitus without complication, without long-term current use of insulin ([facility]) -     POCT HgB A1C  Cellulitis of upper extremity, unspecified laterality -     clindamycin (CLEOCIN) 300 MG capsule; Take 1 capsule (300 mg total) by mouth 3 (three) times daily.  Candidal skin infection -     clotrimazole-betamethasone (LOTRISONE) cream; Apply to affected area twice a day for two weeks, may require four weeks if involving the feet/toes.   Type 2 diabetes: A1c of 6.3, controlled, continue diet and exercise interventions. Cellulitis of the extremities: Start clindamycin Candidal infection of the  axillary region: start Lotrisone  She has decided that she is not going to be taking any blood pressure medication in the future despite my recommendations to restart taking lisinopril.   Return in about 6 months (around 03/27/2016) for Sugar and BP.

## 2015-10-05 ENCOUNTER — Telehealth: Payer: Self-pay

## 2015-10-05 NOTE — Telephone Encounter (Signed)
Pt needs a letter stating that she has been under you care for some time for anxiety and stress.  She do not want you go into detail about her medical conditions nor do she want dates when she was seen because this letter is for the [employer].

## 2015-10-06 NOTE — Telephone Encounter (Signed)
[place], letter placed in in-box ready for pickup/faxing.

## 2015-10-06 NOTE — Telephone Encounter (Signed)
Pt.notified

## 2015-10-12 ENCOUNTER — Inpatient Hospital Stay: Admission: RE | Admit: 2015-10-12 | Payer: Self-pay | Source: Ambulatory Visit

## 2015-10-20 ENCOUNTER — Ambulatory Visit: Payer: Self-pay | Admitting: Family Medicine

## 2015-10-22 ENCOUNTER — Ambulatory Visit (INDEPENDENT_AMBULATORY_CARE_PROVIDER_SITE_OTHER): Payer: Self-pay | Admitting: Family Medicine

## 2015-10-22 ENCOUNTER — Encounter: Payer: Self-pay | Admitting: Family Medicine

## 2015-10-22 VITALS — BP 138/82 | HR 82 | Wt 200.0 lb

## 2015-10-22 DIAGNOSIS — I10 Essential (primary) hypertension: Secondary | ICD-10-CM

## 2015-10-22 DIAGNOSIS — R002 Palpitations: Secondary | ICD-10-CM

## 2015-10-22 NOTE — Progress Notes (Signed)
CC: [name] is a 60 y.o. female is here for No chief complaint on file.   Subjective: HPI:  Palpitations for the last month, was moderate but now mild in severity after cutting back on cafffeine. Symptoms are worse under periods of psychological stress. Also never noticed with activity always noticed at rest. Symptoms last only for 1 second when they occur. She denies chest pain lightheadedness or any motor or sensory disturbances. No interventions as of yet other than that described above. Denies shortness of breath or dizziness.  Follow-up central hypertension: She is trying to stay more active, no outside blood pressures report.   Review Of Systems Outlined In HPI  Past Medical History  Diagnosis Date  . Heart palpitations     hx of MVP  . Chest pain, atypical   . Anxiety   . IBS (irritable bowel syndrome)   . Asthma     Past Surgical History  Procedure Laterality Date  . Eab      x2    Family History  Problem Relation Age of Onset  . Heart attack Father   . Lung cancer Mother     Social History   Social History  . Marital Status: Divorced    Spouse Name: N/A  . Number of Children: N/A  . Years of Education: N/A   Occupational History  . Not on file.   Social History Main Topics  . Smoking status: Never Smoker   . Smokeless tobacco: Not on file     Comment: nonsmoker  . Alcohol Use: No  . Drug Use: No  . Sexual Activity: Yes   Other Topics Concern  . Not on file   Social History Narrative   Divorced; grown son lives in [place].    [facility]- PCP     Objective: BP 138/82 mmHg  Pulse 82  Wt 200 lb (90.719 kg)  General: Alert and Oriented, No Acute Distress HEENT: Pupils equal, round, reactive to light. Conjunctivae clear.  Moist mucous membranes Lungs: Clear to auscultation bilaterally, no wheezing/ronchi/rales.  Comfortable work of breathing. Good air movement. Cardiac: Regular rate and rhythm. Normal S1/S2.  No murmurs, rubs, nor  gallops.   Extremities: No peripheral edema.  Strong peripheral pulses.  Mental Status: No depression, anxiety, nor agitation. Skin: Warm and dry.  Assessment & Plan: Diagnoses and all orders for this visit:  Palpitations -     TSH -     BASIC METABOLIC PANEL WITH GFR  Essential hypertension   Palpitations: Rule out hyperthyroidism or electrolyte abnormality. Discussed that if these are normal she can consider starting on metoprolol for both help her blood pressure and reduce palpitations. Essential hypertension: Improving with increased physical activity.   No Follow-up on file.

## 2015-10-23 LAB — BASIC METABOLIC PANEL WITH GFR
BUN: 24 mg/dL (ref 7–25)
CO2: 24 mmol/L (ref 20–31)
CREATININE: 0.79 mg/dL (ref 0.50–0.99)
Calcium: 9.4 mg/dL (ref 8.6–10.4)
Chloride: 100 mmol/L (ref 98–110)
GFR, Est Non African American: 82 mL/min (ref >=60)
GLUCOSE: 88 mg/dL (ref 65–99)
Potassium: 5.2 mmol/L (ref 3.5–5.3)
Sodium: 138 mmol/L (ref 135–146)

## 2015-10-23 LAB — TSH: TSH: 1.42 m[IU]/L

## 2015-10-25 ENCOUNTER — Telehealth: Payer: Self-pay

## 2015-10-25 MED ORDER — ESTRADIOL-NORETHINDRONE ACET 0.5-0.1 MG PO TABS: 1.0000 | ORAL_TABLET | Freq: Every day | ORAL | Status: DC

## 2015-10-25 NOTE — Telephone Encounter (Signed)
[name] states she is having symptoms of hot flashes, mood swings and dry skin. She states she would like to go back on Activella. Please advise.

## 2015-10-25 NOTE — Telephone Encounter (Signed)
I'm ok with this, rx sent to [facility] on [address]

## 2015-10-26 NOTE — Telephone Encounter (Signed)
Pt.notified

## 2015-10-27 ENCOUNTER — Telehealth: Payer: Self-pay

## 2015-10-27 DIAGNOSIS — Z1159 Encounter for screening for other viral diseases: Secondary | ICD-10-CM

## 2015-10-27 NOTE — Telephone Encounter (Signed)
Pt.notified

## 2015-10-27 NOTE — Telephone Encounter (Signed)
I don't think her symptoms are coming from Hep C but since she was born between 1946-65 it is advised that patients have this blood test at least once in their life so I'll print off a lab order for this test.

## 2015-10-27 NOTE — Telephone Encounter (Signed)
Pt would like to know could she be tested for hep c considering the type of Sx she's having?  She reports she has not yet started taking activella.

## 2015-11-02 ENCOUNTER — Ambulatory Visit: Payer: Self-pay | Admitting: Obstetrics & Gynecology

## 2015-12-07 NOTE — Progress Notes (Signed)
Patient ID: [name], female   DOB: 1956-01-18, 60 y.o.   MRN: [id number]     Cardiology Office Note   Date:  12/09/2015   ID:  [name], DOB 06-Oct-1955, MRN [id number]  PCP:  Marcial Pacas, DO  Cardiologist:   Jenkins Rouge, MD   No chief complaint on file.     History of Present Illness: [name] is a 60 y.o. female who presents for evaluation of atypical chest pain and palpitations.  Last seen in 2012 for same. Normal stress echo . Has Been seen by behavioral health in past for anxiety  Seen by primary 10/22/15 and complained of palpitations. Again they seem related to stress and caffeine TSH Normal in March BP has been labile and up Talked to primary about resuming beta blocker She clearly has anxiety disorder In office today was all over the place With multiple complaints and pressured speech.      Past Medical History  Diagnosis Date  . Heart palpitations     hx of MVP  . Chest pain, atypical   . Anxiety   . IBS (irritable bowel syndrome)   . Asthma     Past Surgical History  Procedure Laterality Date  . Eab      x2      Current Outpatient Prescriptions  Medication Sig Dispense Refill  . AMBULATORY NON FORMULARY MEDICATION Freestyle promise meter Use daily as directed 1 each 0  . AMBULATORY NON FORMULARY MEDICATION Freestyle test strips To use with freestyle promise meter  Test blood sugar twice a day 100 each 11  . aspirin 81 MG tablet Take 1 tablet (81 mg total) by mouth daily. 30 tablet   . B Complex-C (B-COMPLEX WITH VITAMIN C) tablet Take 1 tablet by mouth daily.      . budesonide-formoterol (SYMBICORT) 160-4.5 MCG/ACT inhaler Inhale 2 puffs into the lungs 2 (two) times daily. 3 Inhaler 3  . Calcium-Magnesium-Vitamin D (CALCIUM MAGNESIUM PO) Take by mouth.    . citalopram (CELEXA) 10 MG tablet Take 1 tablet (10 mg total) by mouth daily. NEED FOLLOW UP APPOINTMENT FOR MORE REFILLS 60 tablet 0  . Estradiol-Norethindrone Acet (ACTIVELLA) 0.5-0.1 MG  tablet Take 1 tablet by mouth daily. 90 tablet 2  . Garlic Oil 123XX123 MG CAPS 1 capsule 2 (two) times daily.      . hydrOXYzine (ATARAX/VISTARIL) 25 MG tablet Take 1 tablet (25 mg total) by mouth 3 (three) times daily as needed (for anxiety or grief). 30 tablet 1  . Omega-3 Fatty Acids (FISH OIL) 1000 MG CAPS Take 1 capsule by mouth daily.      . vitamin C (ASCORBIC ACID) 500 MG tablet Take 500 mg by mouth daily.      Marland Kitchen albuterol (PROAIR HFA) 108 (90 BASE) MCG/ACT inhaler Inhale 2 puffs into the lungs every 6 (six) hours as needed for wheezing. 1 Inhaler 6  . metoprolol tartrate (LOPRESSOR) 25 MG tablet Take 1 tablet (25 mg total) by mouth 2 (two) times daily. 60 tablet 11   No current facility-administered medications for this visit.    Allergies:   Penicillins and Effexor    Social History:  The patient  reports that she has never smoked. She does not have any smokeless tobacco history on file. She reports that she does not drink alcohol or use illicit drugs.   Family History:  The patient's family history includes Heart attack in her father; Lung cancer in her mother.  ROS:  Please see the history of present illness.   Otherwise, review of systems are positive for none.   All other systems are reviewed and negative.    PHYSICAL EXAM: VS:  BP 160/80 mmHg  Pulse 80  Ht 5\' 5"  (1.651 m)  Wt 88.959 kg (196 lb 1.9 oz)  BMI 32.64 kg/m2 , BMI Body mass index is 32.64 kg/(m^2). Affect appropriate Healthy:  appears stated age 73: normal Neck supple with no adenopathy JVP normal no bruits no thyromegaly Lungs clear with no wheezing and good diaphragmatic motion Heart:  S1/S2 no murmur, no rub, gallop or click PMI normal Abdomen: benighn, BS positve, no tenderness, no AAA no bruit.  No HSM or HJR Distal pulses intact with no bruits No edema Neuro non-focal Skin warm and dry No muscular weakness    EKG:  05/01/12  SR rate 77 nonspecific ST changes 12/09/15  SR rate 79  inferolateral J point elevation LVH    Recent Labs: 10/22/2015: BUN 24; Creat 0.79; Potassium 5.2; Sodium 138; TSH 1.42    Lipid Panel    Component Value Date/Time   CHOL 202* 12/30/2013 0805   TRIG 191* 12/30/2013 0805   HDL 54 12/30/2013 0805   CHOLHDL 3.7 12/30/2013 0805   VLDL 38 12/30/2013 0805   LDLCALC 110* 12/30/2013 0805      Wt Readings from Last 3 Encounters:  12/09/15 88.959 kg (196 lb 1.9 oz)  10/22/15 90.719 kg (200 lb)  09/28/15 90.719 kg (200 lb)      Other studies Reviewed: Additional studies/ records that were reviewed today include: Primary care notes, Epic labs Old cardiology notes and stress echo 2012.    ASSESSMENT AND PLAN:  1.  Chest Pain atypical f/u stress echo  2. Palpitations benign give anxiety will start lopressor 25 bid for BP and blunting of adrenergic tone 3. Anxiety may have a manic component f/u BH this is her major dysfunctional issue 4. HTN;  Reactive due to anxiety start beta blocker    Current medicines are reviewed at length with the patient today.  The patient does not have concerns regarding medicines.  The following changes have been made:  Lopressor 25 bid  Labs/ tests ordered today include: stress echo    Orders Placed This Encounter  Procedures  . EKG 12-Lead  . Echo stress     Disposition:   FU with cards PRN     Signed, Jenkins Rouge, MD  12/09/2015 12:06 PM    [address], [place], [place]  [address] Phone: [phone]; Fax: [phone]

## 2015-12-09 ENCOUNTER — Encounter: Payer: Self-pay | Admitting: Cardiovascular Disease

## 2015-12-09 ENCOUNTER — Ambulatory Visit (INDEPENDENT_AMBULATORY_CARE_PROVIDER_SITE_OTHER): Payer: Self-pay | Admitting: Cardiovascular Disease

## 2015-12-09 VITALS — BP 160/80 | HR 80 | Ht 65.0 in | Wt 196.1 lb

## 2015-12-09 DIAGNOSIS — I10 Essential (primary) hypertension: Secondary | ICD-10-CM

## 2015-12-09 DIAGNOSIS — R002 Palpitations: Secondary | ICD-10-CM

## 2015-12-09 MED ORDER — METOPROLOL TARTRATE 25 MG PO TABS: 25.0000 mg | ORAL_TABLET | Freq: Two times a day (BID) | ORAL | Status: DC

## 2015-12-09 NOTE — Patient Instructions (Signed)
Medication Instructions:  Your physician has recommended you make the following change in your medication:  1-START metoprolol 25 mg by mouth twice daily  Labwork: NONE  Testing/Procedures: Your physician has requested that you have a stress echocardiogram. For further information please visit [email or web address]. Please follow instruction sheet as given.  Follow-Up: Your physician wants you to follow-up as needed with Dr. Johnsie Cancel.   If you need a refill on your cardiac medications before your next appointment, please call your pharmacy.

## 2015-12-13 ENCOUNTER — Ambulatory Visit: Payer: Self-pay | Admitting: Obstetrics & Gynecology

## 2015-12-14 ENCOUNTER — Ambulatory Visit (INDEPENDENT_AMBULATORY_CARE_PROVIDER_SITE_OTHER): Payer: Self-pay | Admitting: Family Medicine

## 2015-12-14 ENCOUNTER — Encounter: Payer: Self-pay | Admitting: Family Medicine

## 2015-12-14 VITALS — BP 151/80 | HR 80 | Wt 197.0 lb

## 2015-12-14 DIAGNOSIS — T148 Other injury of unspecified body region: Secondary | ICD-10-CM

## 2015-12-14 DIAGNOSIS — W540XXA Bitten by dog, initial encounter: Secondary | ICD-10-CM

## 2015-12-14 MED ORDER — CIPROFLOXACIN HCL 500 MG PO TABS: 500.0000 mg | ORAL_TABLET | Freq: Two times a day (BID) | ORAL | Status: DC

## 2015-12-14 MED ORDER — CLINDAMYCIN HCL 300 MG PO CAPS: 300.0000 mg | ORAL_CAPSULE | Freq: Three times a day (TID) | ORAL | Status: DC

## 2015-12-14 NOTE — Progress Notes (Signed)
CC: [name] is a 60 y.o. female is here for Animal Bite   Subjective: HPI:  She's been playing with her dog and has been biting her arms breaking the skin in multiple spots over the last week. Over the last few days patient has felt chills but no fevers. She is worried that she might have an infection. No interventions as of yet. She denies skin changes other than the bites on her left forearm. She denies swollen lymph nodes, nausea, confusion or joint pain.   Review Of Systems Outlined In HPI  Past Medical History  Diagnosis Date  . Heart palpitations     hx of MVP  . Chest pain, atypical   . Anxiety   . IBS (irritable bowel syndrome)   . Asthma     Past Surgical History  Procedure Laterality Date  . Eab      x2    Family History  Problem Relation Age of Onset  . Heart attack Father   . Lung cancer Mother     Social History   Social History  . Marital Status: Divorced    Spouse Name: N/A  . Number of Children: N/A  . Years of Education: N/A   Occupational History  . Not on file.   Social History Main Topics  . Smoking status: Never Smoker   . Smokeless tobacco: Not on file     Comment: nonsmoker  . Alcohol Use: No  . Drug Use: No  . Sexual Activity: Yes   Other Topics Concern  . Not on file   Social History Narrative   Divorced; grown son lives in [place].    [facility]- PCP     Objective: BP 151/80 mmHg  Pulse 80  Wt 197 lb (89.359 kg)  Vital signs reviewed. General: Alert and Oriented, No Acute Distress HEENT: Pupils equal, round, reactive to light. Conjunctivae clear.  External ears unremarkable.  Moist mucous membranes. Lungs: Clear and comfortable work of breathing, speaking in full sentences without accessory muscle use. Cardiac: Regular rate and rhythm.  Neuro: CN II-XII grossly intact, gait normal. Extremities: No peripheral edema.  Strong peripheral pulses.  Mental Status: No depression, anxiety, nor agitation. Logical  though process. Skin: Warm and dry. Multiple small puncture wounds on the left forearm, there is a single spot that's about 2 inches away from the elbow that has mild to moderate erythema surrounding it which is warm to the touch.  Assessment & Plan: [name] was seen today for animal bite.  Diagnoses and all orders for this visit:  Dog bite -     clindamycin (CLEOCIN) 300 MG capsule; Take 1 capsule (300 mg total) by mouth 3 (three) times daily. -     ciprofloxacin (CIPRO) 500 MG tablet; Take 1 tablet (500 mg total) by mouth 2 (two) times daily.  Dog bites: Moderate suspicion for cellulitis therefore start both Cipro and clindamycin, she is intolerant of amoxicillin.    Return if symptoms worsen or fail to improve.

## 2015-12-17 ENCOUNTER — Telehealth: Payer: Self-pay

## 2015-12-17 NOTE — Telephone Encounter (Signed)
[name] reports that her BP has been around 130/78 for the past couple days. This is just an update.

## 2015-12-20 NOTE — Telephone Encounter (Signed)
LEFT DETAILED MESSAGE ON PATIENT VM WITH INSTRUCTIONS AS NOTED BELOW. [name],CMA  

## 2015-12-20 NOTE — Telephone Encounter (Signed)
That looks good as long as it's under 140/90.

## 2015-12-28 ENCOUNTER — Ambulatory Visit: Payer: Self-pay | Admitting: Obstetrics & Gynecology

## 2015-12-30 ENCOUNTER — Other Ambulatory Visit (HOSPITAL_COMMUNITY): Payer: Self-pay

## 2016-01-17 ENCOUNTER — Ambulatory Visit: Payer: Self-pay | Admitting: Obstetrics & Gynecology

## 2016-01-18 ENCOUNTER — Other Ambulatory Visit (HOSPITAL_COMMUNITY): Payer: Self-pay

## 2016-01-21 ENCOUNTER — Other Ambulatory Visit: Payer: Self-pay | Admitting: Family Medicine

## 2016-01-27 ENCOUNTER — Ambulatory Visit (INDEPENDENT_AMBULATORY_CARE_PROVIDER_SITE_OTHER): Payer: Self-pay | Admitting: Family Medicine

## 2016-01-27 ENCOUNTER — Encounter: Payer: Self-pay | Admitting: Family Medicine

## 2016-01-27 VITALS — BP 145/72 | HR 64 | Wt 196.0 lb

## 2016-01-27 DIAGNOSIS — F419 Anxiety disorder, unspecified: Secondary | ICD-10-CM

## 2016-01-27 DIAGNOSIS — I10 Essential (primary) hypertension: Secondary | ICD-10-CM

## 2016-01-27 MED ORDER — CLONAZEPAM 0.5 MG PO TABS: 0.2500 mg | ORAL_TABLET | Freq: Two times a day (BID) | ORAL | Status: DC | PRN

## 2016-01-27 NOTE — Progress Notes (Signed)
CC: [name] is a 60 y.o. female is here for Hypertension and Animal Bite   Subjective: HPI:  Around 7 or 8:00 this morning while she was resting at the computer she felt somewhat dizzy and got worried that maybe her blood pressure was elevated. When she checked it it was [id number] systolic. She sat down and focused on relaxing with breathing exercises. 5 minutes later it was a systolic of [id number]. She recalls eating spaghetti sauce last night and has not been watching her sodium intake. The dizziness lasted only a matter of seconds. She tells me she feels fine right now other than anxious about her blood pressure. She denies any chest pain shortness of breath orthopnea nor peripheral edema. She denies any irregular heartbeat. Denies any motor or sensory disturbances.  She tells her that she's noticed now that her blood pressure is the highest when her anxiety is at its worst and her blood pressure is normal when she is not anxious. She decided not to take metoprolol but was offered at a previous visit. She is taking lisinopril.    Review Of Systems Outlined In HPI  Past Medical History  Diagnosis Date  . Heart palpitations     hx of MVP  . Chest pain, atypical   . Anxiety   . IBS (irritable bowel syndrome)   . Asthma     Past Surgical History  Procedure Laterality Date  . Eab      x2    Family History  Problem Relation Age of Onset  . Heart attack Father   . Lung cancer Mother     Social History   Social History  . Marital Status: Divorced    Spouse Name: N/A  . Number of Children: N/A  . Years of Education: N/A   Occupational History  . Not on file.   Social History Main Topics  . Smoking status: Never Smoker   . Smokeless tobacco: Not on file     Comment: nonsmoker  . Alcohol Use: No  . Drug Use: No  . Sexual Activity: Yes   Other Topics Concern  . Not on file   Social History Narrative   Divorced; grown son lives in [place].    [facility]- PCP      Objective: BP 145/72 mmHg  Pulse 64  Wt 196 lb (88.905 kg)  General: Alert and Oriented, No Acute Distress HEENT: Pupils equal, round, reactive to light. Conjunctivae clear.Moist mucous membranes times unremarkable Lungs: Clear to auscultation bilaterally, no wheezing/ronchi/rales.  Comfortable work of breathing. Good air movement. Cardiac: Regular rate and rhythm. Normal S1/S2.  No murmurs, rubs, nor gallops.   Extremities: No peripheral edema.  Strong peripheral pulses.  Mental Status: No depression, nor agitation. Mildly anxious Skin: Warm and dry.  Assessment & Plan: [name] was seen today for hypertension and animal bite.  Diagnoses and all orders for this visit:  Essential hypertension  Anxiety  Other orders -     clonazePAM (KLONOPIN) 0.5 MG tablet; Take 0.5 tablets (0.25 mg total) by mouth 2 (two) times daily as needed for anxiety.   Essential hypertension: Uncontrolled chronic condition I again encouraged her to start on metoprolol however she is not interested Anxiety: She is having good days and bad days I discussed possibly using clonazepam on her bad days since hydroxyzine has been ineffective. She is open to this idea.   Return if symptoms worsen or fail to improve.

## 2016-02-11 ENCOUNTER — Ambulatory Visit
Admission: RE | Admit: 2016-02-11 | Discharge: 2016-02-11 | Disposition: A | Discharge status: Home / Self Care | Payer: No Typology Code available for payment source | Source: Ambulatory Visit | Attending: Obstetrics & Gynecology | Admitting: Obstetrics & Gynecology

## 2016-02-11 DIAGNOSIS — Z1231 Encounter for screening mammogram for malignant neoplasm of breast: Secondary | ICD-10-CM

## 2016-02-14 ENCOUNTER — Ambulatory Visit: Payer: Self-pay | Admitting: Obstetrics & Gynecology

## 2016-02-21 ENCOUNTER — Other Ambulatory Visit: Payer: Self-pay

## 2016-02-21 MED ORDER — CITALOPRAM HYDROBROMIDE 10 MG PO TABS: 10.0000 mg | ORAL_TABLET | Freq: Every day | ORAL | 0 refills | Status: DC

## 2016-02-25 ENCOUNTER — Other Ambulatory Visit: Payer: Self-pay | Admitting: Family Medicine

## 2016-03-08 ENCOUNTER — Ambulatory Visit: Payer: Self-pay | Admitting: Obstetrics & Gynecology

## 2016-03-13 ENCOUNTER — Telehealth: Payer: Self-pay | Admitting: Family Medicine

## 2016-03-13 DIAGNOSIS — E785 Hyperlipidemia, unspecified: Secondary | ICD-10-CM

## 2016-03-13 DIAGNOSIS — Z1159 Encounter for screening for other viral diseases: Secondary | ICD-10-CM

## 2016-03-13 NOTE — Telephone Encounter (Signed)
Left vm in regards to lab slip and availability

## 2016-03-13 NOTE — Telephone Encounter (Signed)
Patient called and wants to bring her dog by to see you anytime this week and if you have time she only lives 10 mins away lol. Pt also request to know if she can get Hep C and Cholesterol check. Pt req a call back. Thanks

## 2016-03-13 NOTE — Telephone Encounter (Signed)
Lab slips in your in box, I'm free between 12:30-1:30 pm on Thursday afternoons.

## 2016-03-13 NOTE — Telephone Encounter (Signed)
Please advise.  If its ok with you I can put the order for the lab work.

## 2016-03-23 ENCOUNTER — Ambulatory Visit: Payer: Self-pay | Admitting: Obstetrics & Gynecology

## 2016-04-04 ENCOUNTER — Telehealth: Payer: Self-pay

## 2016-04-04 NOTE — Telephone Encounter (Signed)
Pt wanted to know if she can start taking Mimvey again, I spoke with Zacarias Pontes and she said that it is okay to start taking it again

## 2016-04-11 ENCOUNTER — Telehealth: Payer: Self-pay | Admitting: *Deleted

## 2016-04-11 NOTE — Telephone Encounter (Signed)
We have schedule [name] for her stress echo and she has cancel this test,we have left her several message to call and reschedule. At this time no response from the patient.

## 2016-04-17 ENCOUNTER — Ambulatory Visit: Payer: Self-pay | Admitting: Obstetrics & Gynecology

## 2016-05-02 ENCOUNTER — Encounter: Payer: Self-pay | Admitting: Obstetrics & Gynecology

## 2016-05-02 NOTE — Progress Notes (Signed)
Error

## 2016-05-03 NOTE — Progress Notes (Signed)
This encounter was created in error - please disregard.

## 2016-05-17 ENCOUNTER — Other Ambulatory Visit: Payer: Self-pay

## 2016-05-17 DIAGNOSIS — I10 Essential (primary) hypertension: Secondary | ICD-10-CM

## 2016-05-17 DIAGNOSIS — R002 Palpitations: Secondary | ICD-10-CM

## 2016-05-24 ENCOUNTER — Encounter: Payer: Self-pay | Admitting: Family Medicine

## 2016-05-24 ENCOUNTER — Ambulatory Visit (INDEPENDENT_AMBULATORY_CARE_PROVIDER_SITE_OTHER): Payer: Self-pay | Admitting: Family Medicine

## 2016-05-24 VITALS — BP 157/74 | HR 66 | Wt 192.0 lb

## 2016-05-24 DIAGNOSIS — L2082 Flexural eczema: Secondary | ICD-10-CM

## 2016-05-24 DIAGNOSIS — I10 Essential (primary) hypertension: Secondary | ICD-10-CM

## 2016-05-24 DIAGNOSIS — N61 Mastitis without abscess: Secondary | ICD-10-CM | POA: Insufficient documentation

## 2016-05-24 DIAGNOSIS — F419 Anxiety disorder, unspecified: Secondary | ICD-10-CM

## 2016-05-24 DIAGNOSIS — L739 Follicular disorder, unspecified: Secondary | ICD-10-CM

## 2016-05-24 DIAGNOSIS — E119 Type 2 diabetes mellitus without complications: Secondary | ICD-10-CM

## 2016-05-24 MED ORDER — LISINOPRIL 10 MG PO TABS: 10.0000 mg | ORAL_TABLET | Freq: Every day | ORAL | 1 refills | Status: DC

## 2016-05-24 MED ORDER — DOXYCYCLINE HYCLATE 100 MG PO TABS: 100.0000 mg | ORAL_TABLET | Freq: Two times a day (BID) | ORAL | 1 refills | Status: DC

## 2016-05-24 MED ORDER — TRIAMCINOLONE ACETONIDE 0.5 % EX CREA: 1.0000 "application " | TOPICAL_CREAM | Freq: Two times a day (BID) | CUTANEOUS | 3 refills | Status: DC

## 2016-05-24 NOTE — Patient Instructions (Addendum)
Thank you for coming in today. We can wait on labs for a few months.  We can use doxycycline for infection as needed.  Use the cream for eczema.  Use over the counter lamisil cream for your feet.  We will keep an eye on nausea.   Start lisinopril daily.  Recheck in 1 month.   Lisinopril tablets What is this medicine? LISINOPRIL (lyse IN oh pril) is an ACE inhibitor. This medicine is used to treat high blood pressure and heart failure. It is also used to protect the heart immediately after a heart attack. This medicine may be used for other purposes; ask your health care provider or pharmacist if you have questions. What should I tell my health care provider before I take this medicine? They need to know if you have any of these conditions: -diabetes -heart or blood vessel disease -kidney disease -low blood pressure -previous swelling of the tongue, face, or lips with difficulty breathing, difficulty swallowing, hoarseness, or tightening of the throat -an unusual or allergic reaction to lisinopril, other ACE inhibitors, insect venom, foods, dyes, or preservatives -pregnant or trying to get pregnant -breast-feeding How should I use this medicine? Take this medicine by mouth with a glass of water. Follow the directions on your prescription label. You may take this medicine with or without food. If it upsets your stomach, take it with food. Take your medicine at regular intervals. Do not take it more often than directed. Do not stop taking except on your doctor's advice. Talk to your pediatrician regarding the use of this medicine in children. Special care may be needed. While this drug may be prescribed for children as young as 61 years of age for selected conditions, precautions do apply. Overdosage: If you think you have taken too much of this medicine contact a poison control center or emergency room at once. NOTE: This medicine is only for you. Do not share this medicine with  others. What if I miss a dose? If you miss a dose, take it as soon as you can. If it is almost time for your next dose, take only that dose. Do not take double or extra doses. What may interact with this medicine? Do not take this medicine with any of the following medications: -hymenoptera venomThis medicines may also interact with the following medications: -aliskiren -angiotensin receptor blockers, like losartan or valsartan -certain medicines for diabetes -diuretics -everolimus -gold compounds -lithium -NSAIDs, medicines for pain and inflammation, like ibuprofen or naproxen -potassium salts or supplements -salt substitutes -sirolimus -temsirolimus This list may not describe all possible interactions. Give your health care provider a list of all the medicines, herbs, non-prescription drugs, or dietary supplements you use. Also tell them if you smoke, drink alcohol, or use illegal drugs. Some items may interact with your medicine. What should I watch for while using this medicine? Visit your doctor or health care professional for regular check ups. Check your blood pressure as directed. Ask your doctor what your blood pressure should be, and when you should contact him or her. Do not treat yourself for coughs, colds, or pain while you are using this medicine without asking your doctor or health care professional for advice. Some ingredients may increase your blood pressure. Women should inform their doctor if they wish to become pregnant or think they might be pregnant. There is a potential for serious side effects to an unborn child. Talk to your health care professional or pharmacist for more information. Check with your doctor  or health care professional if you get an attack of severe diarrhea, nausea and vomiting, or if you sweat a lot. The loss of too much body fluid can make it dangerous for you to take this medicine. You may get drowsy or dizzy. Do not drive, use machinery, or do  anything that needs mental alertness until you know how this drug affects you. Do not stand or sit up quickly, especially if you are an older patient. This reduces the risk of dizzy or fainting spells. Alcohol can make you more drowsy and dizzy. Avoid alcoholic drinks. Avoid salt substitutes unless you are told otherwise by your doctor or health care professional. What side effects may I notice from receiving this medicine? Side effects that you should report to your doctor or health care professional as soon as possible: -allergic reactions like skin rash, itching or hives, swelling of the hands, feet, face, lips, throat, or tongue -breathing problems -signs and symptoms of kidney injury like trouble passing urine or change in the amount of urine -signs and symptoms of increased potassium like muscle weakness; chest pain; or fast, irregular heartbeat -signs and symptoms of liver injury like dark yellow or brown urine; general ill feeling or flu-like symptoms; light-colored stools; loss of appetite; nausea; right upper belly pain; unusually weak or tired; yellowing of the eyes or skin -signs and symptoms of low blood pressure like dizziness; feeling faint or lightheaded, falls; unusually weak or tired -stomach pain with or without nausea and vomiting Side effects that usually do not require medical attention (report to your doctor or health care professional if they continue or are bothersome): -changes in taste -cough -dizziness -fever -headache -sensitivity to light This list may not describe all possible side effects. Call your doctor for medical advice about side effects. You may report side effects to FDA at 1-800-FDA-1088. Where should I keep my medicine? Keep out of the reach of children. Store at room temperature between 15 and 30 degrees C (59 and 86 degrees F). Protect from moisture. Keep container tightly closed. Throw away any unused medicine after the expiration date. NOTE: This  sheet is a summary. It may not cover all possible information. If you have questions about this medicine, talk to your doctor, pharmacist, or health care provider.    2016, Elsevier/Gold Standard. (2015-03-11 20:38:20)

## 2016-05-24 NOTE — Progress Notes (Signed)
[name] is a 60 y.o. female who presents to [facility] today for follow up diabetes, eczema, and folliculitis.  Patient has diabetes that is currently controlled with the below medications. No chest pains palpitations shortness of breath. No Polyuria polydipsia.   Eczema: Patient has eczema on the bilateral antecubital fossa's. She previously has been using some cream which she has run out of. She notes itching and irritation.  Leg colitis: Patient notes small red erythematous papules across her arms. He sometimes become tender and occasionally develop into a pustule area no fevers or chills vomiting or diarrhea. No treatment tried yet.  Anxiety: Doing reasonably well with Celexa.  Hypertension: Patient is a history of hypertension but is not currently taking medications. She's been trying diet and lifestyle exercises and changes over the last 6 months.  Health maintenance: Patient has not had much health maintenance items obtained. She currently does not have health insurance and would like to defer costs if possible.   Past Medical History:  Diagnosis Date  . Anxiety   . Asthma   . Chest pain, atypical   . Heart palpitations    hx of MVP  . IBS (irritable bowel syndrome)    Past Surgical History:  Procedure Laterality Date  . eAB     x2    Social History  Substance Use Topics  . Smoking status: Never Smoker  . Smokeless tobacco: Not on file     Comment: nonsmoker  . Alcohol use No   family history includes Heart attack in her father; Lung cancer in her mother.  ROS as above:  Medications: Current Outpatient Prescriptions  Medication Sig Dispense Refill  . AMBULATORY NON FORMULARY MEDICATION Freestyle promise meter Use daily as directed 1 each 0  . AMBULATORY NON FORMULARY MEDICATION Freestyle test strips To use with freestyle promise  meter  Test blood sugar twice a day 100 each 11  . aspirin 81 MG tablet Take 1 tablet (81 mg total) by mouth daily. 30 tablet   . B Complex-C (B-COMPLEX WITH VITAMIN C) tablet Take 1 tablet by mouth daily.      . budesonide-formoterol (SYMBICORT) 160-4.5 MCG/ACT inhaler Inhale 2 puffs into the lungs 2 (two) times daily. 3 Inhaler 3  . Calcium-Magnesium-Vitamin D (CALCIUM MAGNESIUM PO) Take by mouth.    . citalopram (CELEXA) 10 MG tablet TAKE 1 TABLET (10 MG TOTAL) BY MOUTH DAILY. 60 tablet 0  . clonazePAM (KLONOPIN) 0.5 MG tablet Take 0.5 tablets (0.25 mg total) by mouth 2 (two) times daily as needed for anxiety. 20 tablet 1  . Garlic Oil 123XX123 MG CAPS 1 capsule 2 (two) times daily.      . hydrOXYzine (ATARAX/VISTARIL) 25 MG tablet Take 1 tablet (25 mg total) by mouth 3 (three) times daily as needed (for anxiety or grief). 30 tablet 1  . Omega-3 Fatty Acids (FISH OIL) 1000 MG CAPS Take 1 capsule by mouth daily.      Marland Kitchen triamcinolone cream (KENALOG) 0.5 % Apply 1 application topically 2 (two) times daily. To affected areas. 30 g 3  . vitamin C (ASCORBIC ACID) 500 MG tablet Take 500 mg by mouth daily.      Marland Kitchen albuterol (PROAIR HFA) 108 (90 BASE) MCG/ACT inhaler Inhale 2 puffs into the lungs every 6 (six) hours as needed for wheezing. 1 Inhaler 6  . doxycycline (VIBRA-TABS) 100 MG tablet Take 1 tablet (100 mg total) by mouth  2 (two) times daily. 14 tablet 1  . lisinopril (PRINIVIL,ZESTRIL) 10 MG tablet Take 1 tablet (10 mg total) by mouth daily. 30 tablet 1   No current facility-administered medications for this visit.    Allergies  Allergen Reactions  . Penicillins Shortness Of Breath    Reaction as a child  . Effexor [Venlafaxine]     "dopey"    Health Maintenance Health Maintenance  Topic Date Due  . Hepatitis C Screening  10/28/1955  . FOOT EXAM  11/22/1964  . OPHTHALMOLOGY EXAM  11/22/1964  . HIV Screening  11/22/1969  . PAP SMEAR  01/31/2016  . HEMOGLOBIN A1C  03/27/2016  .  PNEUMOCOCCAL POLYSACCHARIDE VACCINE (1) 05/24/2017 (Originally 11/22/1957)  . INFLUENZA VACCINE  05/24/2017 (Originally 02/29/2016)  . COLONOSCOPY  05/24/2017 (Originally 11/22/2004)  . ZOSTAVAX  05/24/2017 (Originally 11/23/2014)  . TETANUS/TDAP  05/24/2017 (Originally 11/22/1973)  . MAMMOGRAM  02/10/2018     Exam:  BP (!) 157/74   Pulse 66   Wt 192 lb (87.1 kg)   BMI 31.95 kg/m  Gen: Well NAD HEENT: EOMI,  MMM Lungs: Normal work of breathing. CTABL Heart: RRR no MRG Abd: NABS, Soft. Nondistended, Nontender Exts: Brisk capillary refill, warm and well perfused.  Skin: Erythematous macular rash antecubital fossa bilaterally. On her forearm she has small erythematous papules that are not particularly tender. Some are excoriated.  Point-of-care hemoglobin A1c was 6.1     Assessment and Plan: 60 y.o. female with  Diabetes: Doing reasonably well. Continue current regimen.  Folliculitis: Treat with doxycycline.  Eczema: Treat with triamcinolone cream.  Hypertension: Not well controlled. Start lisinopril and recheck blood pressure and labs next month.  Anxiety: Reasonably well-controlled. Continue current regimen.  Health maintenance: Deferred all items due to cost.   No orders of the defined types were placed in this encounter.   Discussed warning signs or symptoms. Please see discharge instructions. Patient expresses understanding.

## 2016-05-25 LAB — POCT GLYCOSYLATED HEMOGLOBIN (HGB A1C): Hemoglobin A1C: 6.1

## 2016-05-25 NOTE — Addendum Note (Signed)
Addended by: Darla Lesches T on: 05/25/2016 09:47 AM   Modules accepted: Orders

## 2016-05-29 ENCOUNTER — Telehealth: Payer: Self-pay | Admitting: Cardiovascular Disease

## 2016-05-29 NOTE — Telephone Encounter (Signed)
Patient is worried about taking lisinopril that her PCP prescribed to her on 05/24/16. Patient is wanting to take a holistic and natural approach to treating her body. Patient sounds anxious about everything. Informed patient that a cardiac diet and exercise can help lower her blood pressure. Patient stated that she has had a hard time sticking to a diet and having an exercise routine. Since patient has diagnosis of anxiety, this might be a main contributor to her HTN. Informed patient that she should call her PCP for follow up questions about taking or not taking lisinopril. Patient was also concerned about her stress echo. Informed patient of instructions for stress echo. Patient verbalized understanding and will call her PCP for further advisement if needed.

## 2016-05-29 NOTE — Telephone Encounter (Signed)
Left message for patient to call back  

## 2016-05-29 NOTE — Telephone Encounter (Signed)
New message  BP fluctuates with new meds, lisinopril 10mg   Wants Nishan to know she is on this medication  Makes her fatiqued  Wants to know if this is ok to do the stress test   [phone]

## 2016-06-01 ENCOUNTER — Telehealth (HOSPITAL_COMMUNITY): Payer: Self-pay | Admitting: *Deleted

## 2016-06-01 NOTE — Telephone Encounter (Signed)
Patient given detailed instructions per Stress Test Requisition Sheet for test on 06/06/16 at 2:30.Patient Notified to arrive 30 minutes early, and that it is imperative to arrive on time for appointment to keep from having the test rescheduled.  Patient verbalized understanding. [name]

## 2016-06-06 ENCOUNTER — Other Ambulatory Visit (HOSPITAL_COMMUNITY): Payer: No Typology Code available for payment source

## 2016-06-07 ENCOUNTER — Telehealth: Payer: Self-pay

## 2016-06-07 NOTE — Telephone Encounter (Signed)
Pt reports that after taking her lisinopril this morning her BP remained high.  She stated that her son has put her under a lot of stress this morning that is causing her to anxiety.  I encourage her to take a citalopram tablet and rest a little while then recheck BP.  Pt will contact office if BP continues to be high after recommendations. -[facility]/RMA

## 2016-06-27 ENCOUNTER — Other Ambulatory Visit (HOSPITAL_COMMUNITY): Payer: No Typology Code available for payment source

## 2016-07-04 ENCOUNTER — Other Ambulatory Visit: Payer: Self-pay | Admitting: *Deleted

## 2016-07-04 MED ORDER — CITALOPRAM HYDROBROMIDE 10 MG PO TABS: 10.0000 mg | ORAL_TABLET | Freq: Every day | ORAL | 3 refills | Status: DC

## 2016-07-05 ENCOUNTER — Ambulatory Visit: Payer: No Typology Code available for payment source | Admitting: Obstetrics & Gynecology

## 2016-07-05 DIAGNOSIS — Z01419 Encounter for gynecological examination (general) (routine) without abnormal findings: Secondary | ICD-10-CM

## 2016-07-06 ENCOUNTER — Other Ambulatory Visit (HOSPITAL_COMMUNITY): Payer: No Typology Code available for payment source

## 2016-07-17 ENCOUNTER — Other Ambulatory Visit: Payer: Self-pay | Admitting: Family Medicine

## 2016-07-27 ENCOUNTER — Telehealth (HOSPITAL_COMMUNITY): Payer: Self-pay | Admitting: *Deleted

## 2016-07-27 NOTE — Telephone Encounter (Signed)
Left message on voicemail per DPR in reference to upcoming appointment scheduled on 08/02/16 at 2:30 with detailed instructions given per Stress Test Requisition Sheet for the test. LM to arrive 30 minutes early, and that it is imperative to arrive on time for appointment to keep from having the test rescheduled. If you need to cancel or reschedule your appointment, please call the office within 24 hours of your appointment. Failure to do so may result in a cancellation of your appointment, and a $50 no show fee. Phone number given for call back for any questions. Veronia Beets

## 2016-08-02 ENCOUNTER — Other Ambulatory Visit (HOSPITAL_COMMUNITY): Payer: No Typology Code available for payment source

## 2016-08-18 ENCOUNTER — Ambulatory Visit: Payer: No Typology Code available for payment source | Admitting: Family Medicine

## 2016-08-23 ENCOUNTER — Ambulatory Visit: Payer: No Typology Code available for payment source | Admitting: Family Medicine

## 2016-08-25 ENCOUNTER — Ambulatory Visit (INDEPENDENT_AMBULATORY_CARE_PROVIDER_SITE_OTHER): Payer: BLUE CROSS/BLUE SHIELD | Admitting: Family Medicine

## 2016-08-25 VITALS — BP 142/54 | HR 77 | Wt 190.0 lb

## 2016-08-25 DIAGNOSIS — J45998 Other asthma: Secondary | ICD-10-CM | POA: Diagnosis not present

## 2016-08-25 DIAGNOSIS — I10 Essential (primary) hypertension: Secondary | ICD-10-CM | POA: Diagnosis not present

## 2016-08-25 DIAGNOSIS — F419 Anxiety disorder, unspecified: Secondary | ICD-10-CM

## 2016-08-25 DIAGNOSIS — Z1211 Encounter for screening for malignant neoplasm of colon: Secondary | ICD-10-CM

## 2016-08-25 DIAGNOSIS — R7303 Prediabetes: Secondary | ICD-10-CM

## 2016-08-25 DIAGNOSIS — E119 Type 2 diabetes mellitus without complications: Secondary | ICD-10-CM | POA: Diagnosis not present

## 2016-08-25 DIAGNOSIS — L2082 Flexural eczema: Secondary | ICD-10-CM

## 2016-08-25 DIAGNOSIS — E782 Mixed hyperlipidemia: Secondary | ICD-10-CM

## 2016-08-25 MED ORDER — CLOBETASOL PROPIONATE 0.05 % EX OINT: 1.0000 "application " | TOPICAL_OINTMENT | Freq: Two times a day (BID) | CUTANEOUS | 1 refills | Status: DC

## 2016-08-25 MED ORDER — TRIAMCINOLONE ACETONIDE 0.1 % EX CREA: 1.0000 "application " | TOPICAL_CREAM | Freq: Two times a day (BID) | CUTANEOUS | 2 refills | Status: DC

## 2016-08-25 NOTE — Patient Instructions (Signed)
Thank you for coming in today. Use the little tube of cream on the arms as needed until it goes away. Use the big tub of cream as needed daily.   Get fasting labs in the near future.   Recheck in 3-6 months or sooner if needed    You should get some info on cologuard soon.  Send it in.   Research the pneumonia vaccine.   Pneumococcal Vaccine, Polyvalent solution for injection What is this medicine? PNEUMOCOCCAL VACCINE, POLYVALENT (NEU mo KOK al vak SEEN, pol ee VEY luhnt) is a vaccine to prevent pneumococcus bacteria infection. These bacteria are a major cause of ear infections, Strep throat infections, and serious pneumonia, meningitis, or blood infections worldwide. These vaccines help the body to produce antibodies (protective substances) that help your body defend against these bacteria. This vaccine is recommended for people 18 years of age and older with health problems. It is also recommended for all adults over 40 years old. This vaccine will not treat an infection. COMMON BRAND NAME(S): Pneumovax 23 What should I tell my health care provider before I take this medicine? They need to know if you have any of these conditions: -bleeding problems -bone marrow or organ transplant -cancer, Hodgkin's disease -fever -infection -immune system problems -low platelet count in the blood -seizures -an unusual or allergic reaction to pneumococcal vaccine, diphtheria toxoid, other vaccines, latex, other medicines, foods, dyes, or preservatives -pregnant or trying to get pregnant -breast-feeding How should I use this medicine? This vaccine is for injection into a muscle or under the skin. It is given by a health care professional. A copy of Vaccine Information Statements will be given before each vaccination. Read this sheet carefully each time. The sheet may change frequently. Talk to your pediatrician regarding the use of this medicine in children. While this drug may be prescribed for  children as young as 78 years of age for selected conditions, precautions do apply. What if I miss a dose? It is important not to miss your dose. Call your doctor or health care professional if you are unable to keep an appointment. What may interact with this medicine? -medicines for cancer chemotherapy -medicines that suppress your immune function -medicines that treat or prevent blood clots like warfarin, enoxaparin, and dalteparin -steroid medicines like prednisone or cortisone What should I watch for while using this medicine? Mild fever and pain should go away in 3 days or less. Report any unusual symptoms to your doctor or health care professional. What side effects may I notice from receiving this medicine? Side effects that you should report to your doctor or health care professional as soon as possible: -allergic reactions like skin rash, itching or hives, swelling of the face, lips, or tongue -breathing problems -confused -fever over 102 degrees F -pain, tingling, numbness in the hands or feet -seizures -unusual bleeding or bruising -unusual muscle weakness Side effects that usually do not require medical attention (report to your doctor or health care professional if they continue or are bothersome): -aches and pains -diarrhea -fever of 102 degrees F or less -headache -irritable -loss of appetite -pain, tender at site where injected -trouble sleeping Where should I keep my medicine? This does not apply. This vaccine is given in a clinic, pharmacy, doctor's office, or other health care setting and will not be stored at home.  2017 Elsevier/Gold Standard (2008-02-21 14:32:37)

## 2016-08-25 NOTE — Progress Notes (Signed)
[name] is a 61 y.o. female who presents to [facility] today for follow up HTN, Prediabetes, eczema  and Asthma.  HTN: Doing well with lisinopril. She denies any chest pain, palpitations or shortness of breath. She notes that her BP at home is typically well controlled ranging from 120-150.   Prediabetes: Patient has a history of diabetes that has turned into prediabetes with diet control. She feels well with no polyuria polydipsia and does not check her blood sugar regularly.  Eczema: Patient has a history of eczema that obviously was pretty well controlled with 0.5% triamcinolone cream. She notes that with the dry cold air in the winter her eczema has worsened. She notes itching redness and swelling of her flexor elbow is bilaterally. She is not using moisturizers regularly.  Asthma: Patient has a history of asthma is typically very well controlled with Symbicort. She notes that she does not have to use her rescue inhaler frequently. She denies chest pain wheezing or shortness of breath.   Past Medical History:  Diagnosis Date  . Anxiety   . Asthma   . Chest pain, atypical   . Heart palpitations    hx of MVP  . IBS (irritable bowel syndrome)    Past Surgical History:  Procedure Laterality Date  . eAB     x2    Social History  Substance Use Topics  . Smoking status: Never Smoker  . Smokeless tobacco: Not on file     Comment: nonsmoker  . Alcohol use No   family history includes Heart attack in her father; Lung cancer in her mother.  ROS as above:  Medications: Current Outpatient Prescriptions  Medication Sig Dispense Refill  . AMBULATORY NON FORMULARY MEDICATION Freestyle promise meter Use daily as directed 1 each 0  . AMBULATORY NON FORMULARY MEDICATION Freestyle test strips To use with freestyle promise meter  Test blood sugar twice a day  100 each 11  . aspirin 81 MG tablet Take 1 tablet (81 mg total) by mouth daily. 30 tablet   . B Complex-C (B-COMPLEX WITH VITAMIN C) tablet Take 1 tablet by mouth daily.      . budesonide-formoterol (SYMBICORT) 160-4.5 MCG/ACT inhaler Inhale 2 puffs into the lungs 2 (two) times daily. 3 Inhaler 3  . Calcium-Magnesium-Vitamin D (CALCIUM MAGNESIUM PO) Take by mouth.    . citalopram (CELEXA) 10 MG tablet Take 1 tablet (10 mg total) by mouth daily. 60 tablet 3  . Garlic Oil 123XX123 MG CAPS 1 capsule 2 (two) times daily.      Marland Kitchen lisinopril (PRINIVIL,ZESTRIL) 10 MG tablet TAKE 1 TABLET (10 MG TOTAL) BY MOUTH DAILY. 30 tablet 1  . Omega-3 Fatty Acids (FISH OIL) 1000 MG CAPS Take 1 capsule by mouth daily.      . vitamin C (ASCORBIC ACID) 500 MG tablet Take 500 mg by mouth daily.      Marland Kitchen albuterol (PROAIR HFA) 108 (90 BASE) MCG/ACT inhaler Inhale 2 puffs into the lungs every 6 (six) hours as needed for wheezing. 1 Inhaler 6  . clobetasol ointment (TEMOVATE) AB-123456789 % Apply 1 application topically 2 (two) times daily. 60 g 1  . triamcinolone cream (KENALOG) 0.1 % Apply 1 application topically 2 (two) times daily. 453.6 g 2   No current facility-administered medications for this visit.    Allergies  Allergen Reactions  . Penicillins Shortness Of Breath    Reaction as a child  .  Effexor [Venlafaxine]     "dopey"    Health Maintenance Health Maintenance  Topic Date Due  . FOOT EXAM  03/31/2017 (Originally 11/22/1965)  . PAP SMEAR  03/31/2017 (Originally 01/31/2016)  . OPHTHALMOLOGY EXAM  03/31/2017 (Originally 11/22/1965)  . Hepatitis C Screening  03/31/2017 (Originally [birth year])  . HIV Screening  03/31/2017 (Originally 11/22/1969)  . PNEUMOCOCCAL POLYSACCHARIDE VACCINE (1) 05/24/2017 (Originally [birth year]22[birth year]1956)  . INFLUENZA VACCINE  05/24/2017 (Originally 02/29/2016)  . COLONOSCOPY  05/24/2017 (Originally 11/22/2004)  . ZOSTAVAX  05/24/2017 (Originally 11/23/2014)  . TETANUS/TDAP  05/24/2017 (Originally  11/22/1973)  . HEMOGLOBIN A1C  11/23/2016  . MAMMOGRAM  02/10/2018     Exam:  BP (!) 142/54   Pulse 77   Wt 190 lb (86.2 kg)   BMI 31.62 kg/m  Gen: Well NAD HEENT: EOMI,  MMM Lungs: Normal work of breathing. CTABL Heart: RRR no MRG Abd: NABS, Soft. Nondistended, Nontender Exts: Brisk capillary refill, warm and well perfused.  Skin: Erythematous maculopapular rash with some excoriation of the flexor elbows bilaterally  No results found for this or any previous visit (from the past 72 hour(s)). No results found.    Assessment and Plan: 61 y.o. female with  Hypertension: Marginally controlled. Continue current regimen and check basic fasting labs listed below.  Prediabetes: Doing reasonably well. A1c most recently was around 6. Will check A1c and with fasting labs.  Eczema: Not well controlled at all. We'll increase potency to clobetasol ointment and use large volume topical triamcinolone cream as needed daily.   asthma: Doing well continue current regimen.  Maintenance: Patient has declined most of her health maintenance items in the past. She's agreed to think about Pneumovax and Tdap.  She has also agreed to complete Cologuard for colon cancer screening.   Orders Placed This Encounter  Procedures  . CBC  . COMPLETE METABOLIC PANEL WITH GFR  . Lipid panel  . Hepatitis C antibody  . HIV antibody  . Hemoglobin A1c    Discussed warning signs or symptoms. Please see discharge instructions. Patient expresses understanding.  I spent 40 minutes with this patient, greater than 50% was face-to-face time counseling regarding the above diagnosis.

## 2016-08-25 NOTE — Progress Notes (Signed)
cologuard ordered.

## 2016-08-28 ENCOUNTER — Telehealth: Payer: Self-pay

## 2016-08-28 NOTE — Telephone Encounter (Signed)
Pt called stating that she is well educated in holistic medicine and vitamin and minerals and is concerned about the side effects of lisinopril.  She plans to double the amount of calcium magnesium she takes to replenish any mineral loss potentially lost by taking lisinopril and reducing lisinopril to 5 MG daily. She plans to come in this week to have lab work done as well. Advised pt to keep a daily log with bp and that I would contact her should you have any recommendations.

## 2016-08-29 ENCOUNTER — Ambulatory Visit (INDEPENDENT_AMBULATORY_CARE_PROVIDER_SITE_OTHER): Payer: BLUE CROSS/BLUE SHIELD | Admitting: Family Medicine

## 2016-08-29 VITALS — BP 150/75 | HR 76 | Wt 185.0 lb

## 2016-08-29 DIAGNOSIS — E782 Mixed hyperlipidemia: Secondary | ICD-10-CM | POA: Diagnosis not present

## 2016-08-29 DIAGNOSIS — R002 Palpitations: Secondary | ICD-10-CM | POA: Insufficient documentation

## 2016-08-29 DIAGNOSIS — E119 Type 2 diabetes mellitus without complications: Secondary | ICD-10-CM | POA: Diagnosis not present

## 2016-08-29 DIAGNOSIS — I10 Essential (primary) hypertension: Secondary | ICD-10-CM | POA: Diagnosis not present

## 2016-08-29 DIAGNOSIS — F419 Anxiety disorder, unspecified: Secondary | ICD-10-CM

## 2016-08-29 LAB — CBC
HCT: 41.3 % (ref 35.0–45.0)
Hemoglobin: 13.8 g/dL (ref 11.7–15.5)
MCH: 28.9 pg (ref 27.0–33.0)
MCHC: 33.4 g/dL (ref 32.0–36.0)
MCV: 86.6 fL (ref 80.0–100.0)
MPV: 9.3 fL (ref 7.5–12.5)
Platelets: 284 10*3/uL (ref 140–400)
RBC: 4.77 MIL/uL (ref 3.80–5.10)
RDW: 13.7 % (ref 11.0–15.0)
WBC: 6.1 10*3/uL (ref 3.8–10.8)

## 2016-08-29 NOTE — Patient Instructions (Signed)
Thank you for coming in today. You should hear from therapy soon.  Please recheck in 2-4 weeks or sooner if needed.  You should hear about the Holter Monitor soon.  We will let you know about labs soon.   Call or go to the emergency room if you get worse, have trouble breathing, have chest pains, or palpitations.    Palpitations A palpitation is the feeling that your heartbeat is irregular or is faster than normal. It may feel like your heart is fluttering or skipping a beat. Palpitations are usually not a serious problem. They may be caused by many things, including smoking, caffeine, alcohol, stress, and certain medicines. Although most causes of palpitations are not serious, palpitations can be a sign of a serious medical problem. In some cases, you may need further medical evaluation. Follow these instructions at home: Pay attention to any changes in your symptoms. Take these actions to help with your condition:  Avoid the following:  Caffeinated coffee, tea, soft drinks, diet pills, and energy drinks.  Chocolate.  Alcohol.  Do not use any tobacco products, such as cigarettes, chewing tobacco, and e-cigarettes. If you need help quitting, ask your health care provider.  Try to reduce your stress and anxiety. Things that can help you relax include:  Yoga.  Meditation.  Physical activity, such as swimming, jogging, or walking.  Biofeedback. This is a method that helps you learn to use your mind to control things in your body, such as your heartbeats.  Get plenty of rest and sleep.  Take over-the-counter and prescription medicines only as told by your health care provider.  Keep all follow-up visits as told by your health care provider. This is important. Contact a health care provider if:  You continue to have a fast or irregular heartbeat after 24 hours.  Your palpitations occur more often. Get help right away if:  You have chest pain or shortness of breath.  You  have a severe headache.  You feel dizzy or you faint. This information is not intended to replace advice given to you by your health care provider. Make sure you discuss any questions you have with your health care provider. Document Released: 07/14/2000 Document Revised: 12/20/2015 Document Reviewed: 04/01/2015 Elsevier Interactive Patient Education  2017 [employer].

## 2016-08-29 NOTE — Progress Notes (Signed)
[name] is a 61 y.o. female who presents to [facility] today for palpitations and anxiety.  Patient has a history of hypertension and was recently started on lisinopril. She notes that she's been taking a half of a tablet of lisinopril for fear of side effects. Yesterday evening she developed significant anxiety and felt palpitations. She measured her blood pressure found to be somewhat elevated and her home blood pressure monitor noted an irregular heart rate. This made her extremely anxious and she has measured her blood pressure multiple times to be somewhat elevated but she has not had any repeat of an irregular heart rate reading.  She notes she has a stress echo scheduled for February 12 to workup previous history of chest pain.  She has a past history of anxiety related to her child. She has a very tough relationship with her adult son.  She takes Celexa daily which typically has helped. She does not attend counseling. She is very interested in counseling and possible. She denies any SI or HI.   Past Medical History:  Diagnosis Date  . Anxiety   . Asthma   . Chest pain, atypical   . Heart palpitations    hx of MVP  . IBS (irritable bowel syndrome)    Past Surgical History:  Procedure Laterality Date  . eAB     x2    Social History  Substance Use Topics  . Smoking status: Never Smoker  . Smokeless tobacco: Not on file     Comment: nonsmoker  . Alcohol use No   family history includes Heart attack in her father; Lung cancer in her mother.  ROS as above:  Medications: Current Outpatient Prescriptions  Medication Sig Dispense Refill  . albuterol (PROAIR HFA) 108 (90 BASE) MCG/ACT inhaler Inhale 2 puffs into the lungs every 6 (six) hours as needed for wheezing. 1 Inhaler 6  . AMBULATORY NON FORMULARY MEDICATION Freestyle promise meter Use daily as  directed 1 each 0  . AMBULATORY NON FORMULARY MEDICATION Freestyle test strips To use with freestyle promise meter  Test blood sugar twice a day 100 each 11  . aspirin 81 MG tablet Take 1 tablet (81 mg total) by mouth daily. 30 tablet   . B Complex-C (B-COMPLEX WITH VITAMIN C) tablet Take 1 tablet by mouth daily.      . budesonide-formoterol (SYMBICORT) 160-4.5 MCG/ACT inhaler Inhale 2 puffs into the lungs 2 (two) times daily. 3 Inhaler 3  . Calcium-Magnesium-Vitamin D (CALCIUM MAGNESIUM PO) Take by mouth.    . citalopram (CELEXA) 10 MG tablet Take 1 tablet (10 mg total) by mouth daily. 60 tablet 3  . clobetasol ointment (TEMOVATE) AB-123456789 % Apply 1 application topically 2 (two) times daily. 60 g 1  . Garlic Oil 123XX123 MG CAPS 1 capsule 2 (two) times daily.      Marland Kitchen lisinopril (PRINIVIL,ZESTRIL) 10 MG tablet TAKE 1 TABLET (10 MG TOTAL) BY MOUTH DAILY. 30 tablet 1  . Omega-3 Fatty Acids (FISH OIL) 1000 MG CAPS Take 1 capsule by mouth daily.      Marland Kitchen triamcinolone cream (KENALOG) 0.1 % Apply 1 application topically 2 (two) times daily. 453.6 g 2  . vitamin C (ASCORBIC ACID) 500 MG tablet Take 500 mg by mouth daily.       No current facility-administered medications for this visit.    Allergies  Allergen Reactions  . Penicillins Shortness Of Breath  Reaction as a child  . Effexor [Venlafaxine]     "dopey"    Health Maintenance Health Maintenance  Topic Date Due  . Fecal DNA (Cologuard)  11/22/2004  . PAP SMEAR  03/31/2017 (Originally 01/31/2016)  . Hepatitis C Screening  03/31/2017 (Originally 1955/08/17)  . HIV Screening  03/31/2017 (Originally 11/22/1969)  . INFLUENZA VACCINE  05/24/2017 (Originally 02/29/2016)  . ZOSTAVAX  05/24/2017 (Originally 11/23/2014)  . TETANUS/TDAP  05/24/2017 (Originally 11/22/1973)  . MAMMOGRAM  02/10/2018     Exam:  BP (!) 150/75   Pulse 76   Wt 185 lb (83.9 kg)   BMI 30.79 kg/m  Gen: Well NAD Nontoxic appearing HEENT: EOMI,  MMM Lungs: Normal work of  breathing. CTABL Heart: RRR no MRG Abd: NABS, Soft. Nondistended, Nontender Exts: Brisk capillary refill, warm and well perfused.  Psych: Alert and oriented anxious appearing affect. No SI or HI. Normal thought process.  Twelve-lead EKG shows normal sinus rhythm at 67 bpm. Possible atrial enlargement present. No significant change from prior EKG.  Depression screen PHQ 2/9 08/29/2016  Decreased Interest 2  Down, Depressed, Hopeless 2  PHQ - 2 Score 4  Altered sleeping 1  Tired, decreased energy 1  Change in appetite 2  Feeling bad or failure about yourself  0  Trouble concentrating 0  Moving slowly or fidgety/restless 2  Suicidal thoughts 0  PHQ-9 Score 10   GAD 7 : Generalized Anxiety Score 08/29/2016  Nervous, Anxious, on Edge 1  Control/stop worrying 3  Worry too much - different things 3  Trouble relaxing 2  Restless 2  Easily annoyed or irritable 2  Afraid - awful might happen 2  Total GAD 7 Score 15  Anxiety Difficulty Very difficult       No results found for this or any previous visit (from the past 72 hour(s)). No results found.    Assessment and Plan: 61 y.o. female with  Palpitations: Metabolic workup ordered at the last visit is pending and was drawn this morning. We'll await labs to assess for electrolyte abnormality. Additionally I think it's worthwhile to proceed with a Holter monitor and to proceed with the stress echo exam that is pending in about 2 weeks.  Discuss signs or symptoms necessitate presentation to the emergency department.  Hypertension: Blood pressures a bit elevated today. Plan to recheck in a few weeks. Likely will continue to increase with [place].  Anxiety: not well-controlled today. We discussed options. Patient would like to avoid more medications at this time if possible. I think is reasonable to proceed with referral for counseling.  Recheck in a few weeks.   Orders Placed This Encounter  Procedures  . Ambulatory referral  to Psychology    Referral Priority:   Routine    Referral Type:   Psychiatric    Referral Reason:   Specialty Services Required    Requested Specialty:   Psychology    Number of Visits Requested:   1  . Holter monitor - 48 hour    Standing Status:   Future    Standing Expiration Date:   08/29/2026    Order Specific Question:   Where should this test be performed?    Answer:   [facility]    Discussed warning signs or symptoms. Please see discharge instructions. Patient expresses understanding.  I spent 40 minutes with this patient, greater than 50% was face-to-face time counseling regarding the above diagnosis.

## 2016-08-29 NOTE — Addendum Note (Signed)
Addended by: Darla Lesches T on: 08/29/2016 01:36 PM   Modules accepted: Orders

## 2016-08-30 LAB — COMPLETE METABOLIC PANEL WITH GFR
ALT: 12 U/L (ref 6–29)
AST: 25 U/L (ref 10–35)
Albumin: 4.3 g/dL (ref 3.6–5.1)
Alkaline Phosphatase: 92 U/L (ref 33–130)
BUN: 17 mg/dL (ref 7–25)
CHLORIDE: 101 mmol/L (ref 98–110)
CO2: 27 mmol/L (ref 20–31)
CREATININE: 0.95 mg/dL (ref 0.50–0.99)
Calcium: 9.8 mg/dL (ref 8.6–10.4)
GFR, Est African American: 75 mL/min (ref >=60)
GFR, Est Non African American: 65 mL/min (ref >=60)
Glucose, Bld: 108 mg/dL — ABNORMAL HIGH (ref 65–99)
Potassium: 5 mmol/L (ref 3.5–5.3)
Sodium: 138 mmol/L (ref 135–146)
Total Bilirubin: 0.5 mg/dL (ref 0.2–1.2)
Total Protein: 7.5 g/dL (ref 6.1–8.1)

## 2016-08-30 LAB — HIV ANTIBODY (ROUTINE TESTING W REFLEX): HIV: NONREACTIVE

## 2016-08-30 LAB — LIPID PANEL
Cholesterol: 216 mg/dL — ABNORMAL HIGH (ref <200)
HDL: 54 mg/dL (ref >50)
LDL CALC: 120 mg/dL — AB (ref <100)
Total CHOL/HDL Ratio: 4 Ratio (ref <5.0)
Triglycerides: 212 mg/dL — ABNORMAL HIGH (ref <150)
VLDL: 42 mg/dL — AB (ref <30)

## 2016-08-30 LAB — HEMOGLOBIN A1C
HEMOGLOBIN A1C: 6.1 % — AB (ref <5.7)
Mean Plasma Glucose: 128 mg/dL

## 2016-08-30 LAB — HEPATITIS C ANTIBODY: HCV Ab: NEGATIVE

## 2016-08-31 ENCOUNTER — Encounter: Payer: Self-pay | Admitting: Family Medicine

## 2016-09-01 ENCOUNTER — Encounter: Payer: Self-pay | Admitting: Family Medicine

## 2016-09-03 ENCOUNTER — Encounter: Payer: Self-pay | Admitting: Family Medicine

## 2016-09-04 ENCOUNTER — Encounter: Payer: Self-pay | Admitting: Family Medicine

## 2016-09-06 ENCOUNTER — Telehealth: Payer: Self-pay | Admitting: Emergency Medicine

## 2016-09-06 ENCOUNTER — Encounter: Payer: Self-pay | Admitting: Family Medicine

## 2016-09-08 ENCOUNTER — Telehealth (HOSPITAL_COMMUNITY): Payer: Self-pay | Admitting: *Deleted

## 2016-09-08 NOTE — Telephone Encounter (Signed)
Patient given detailed instructions per Stress Test Requisition Sheet for test on 09/11/16 at 2:30.Patient Notified to arrive 30 minutes early, and that it is imperative to arrive on time for appointment to keep from having the test rescheduled.  Patient verbalized understanding. [name]

## 2016-09-11 ENCOUNTER — Ambulatory Visit: Payer: BLUE CROSS/BLUE SHIELD

## 2016-09-11 ENCOUNTER — Telehealth: Payer: Self-pay | Admitting: Radiology

## 2016-09-11 ENCOUNTER — Ambulatory Visit (HOSPITAL_COMMUNITY): Payer: BLUE CROSS/BLUE SHIELD | Attending: Cardiovascular Disease

## 2016-09-11 ENCOUNTER — Ambulatory Visit (HOSPITAL_COMMUNITY): Payer: BLUE CROSS/BLUE SHIELD

## 2016-09-11 ENCOUNTER — Encounter: Payer: Self-pay | Admitting: Family Medicine

## 2016-09-11 DIAGNOSIS — I10 Essential (primary) hypertension: Secondary | ICD-10-CM | POA: Insufficient documentation

## 2016-09-11 DIAGNOSIS — R002 Palpitations: Secondary | ICD-10-CM | POA: Insufficient documentation

## 2016-09-11 NOTE — Telephone Encounter (Signed)
Patient was scheduled to have a 48hr Holter Monitor placed at 4pm today. At 4:03 I went up front to get the patient and was told she had left without having the monitor placed. Per the front desk/checkout staff the patient was anxious and upset because she had to wait after her other testing was completed. Unfortunately the monitor schedule was booked and I was unable to get her early.

## 2016-09-12 NOTE — Telephone Encounter (Signed)
Can we try to get this holter monitor rescheduled? We do need this test to be done.

## 2016-09-13 ENCOUNTER — Encounter: Payer: Self-pay | Admitting: Family Medicine

## 2016-09-13 MED ORDER — HYDROXYZINE HCL 25 MG PO TABS: 25.0000 mg | ORAL_TABLET | Freq: Three times a day (TID) | ORAL | 1 refills | Status: DC | PRN

## 2016-09-14 ENCOUNTER — Encounter: Payer: Self-pay | Admitting: Family Medicine

## 2016-09-15 ENCOUNTER — Encounter: Payer: Self-pay | Admitting: Family Medicine

## 2016-09-18 ENCOUNTER — Encounter: Payer: Self-pay | Admitting: Family Medicine

## 2016-09-21 ENCOUNTER — Other Ambulatory Visit: Payer: Self-pay | Admitting: Family Medicine

## 2016-09-28 ENCOUNTER — Ambulatory Visit: Payer: BLUE CROSS/BLUE SHIELD | Admitting: Obstetrics & Gynecology

## 2016-10-01 ENCOUNTER — Encounter: Payer: Self-pay | Admitting: Family Medicine

## 2016-10-03 ENCOUNTER — Encounter: Payer: Self-pay | Admitting: Family Medicine

## 2016-10-16 ENCOUNTER — Encounter: Payer: Self-pay | Admitting: Family Medicine

## 2016-10-16 DIAGNOSIS — H9319 Tinnitus, unspecified ear: Secondary | ICD-10-CM

## 2016-10-18 ENCOUNTER — Encounter: Payer: Self-pay | Admitting: Family Medicine

## 2016-11-03 ENCOUNTER — Telehealth: Payer: Self-pay | Admitting: *Deleted

## 2016-11-03 ENCOUNTER — Encounter: Payer: Self-pay | Admitting: *Deleted

## 2016-11-03 ENCOUNTER — Encounter: Payer: Self-pay | Admitting: Family Medicine

## 2016-11-03 NOTE — Telephone Encounter (Signed)
We have left several message for [name] to call and reschedule her monitor,a letter will be mailed today.  09/28/2016 LMOM for pt to call and schedule monitor appt. stpegram  09/18/16  NS--09/11/16,LMOM TO CALL AND RESCHEDULE.  10/19/16 LMOM TO CALL AND RESCHEDULE FROM 09/11/16  11/03/16 [facility] AND MAILED A LETTER

## 2016-11-20 ENCOUNTER — Ambulatory Visit (HOSPITAL_COMMUNITY): Payer: BLUE CROSS/BLUE SHIELD | Admitting: Licensed Clinical Social Worker

## 2016-11-23 ENCOUNTER — Ambulatory Visit (HOSPITAL_COMMUNITY): Payer: BLUE CROSS/BLUE SHIELD | Admitting: Licensed Clinical Social Worker

## 2016-11-28 ENCOUNTER — Other Ambulatory Visit: Payer: Self-pay | Admitting: Family Medicine

## 2016-12-03 ENCOUNTER — Encounter: Payer: Self-pay | Admitting: Family Medicine

## 2016-12-05 ENCOUNTER — Encounter: Payer: Self-pay | Admitting: Family Medicine

## 2016-12-12 ENCOUNTER — Encounter: Payer: Self-pay | Admitting: Family Medicine

## 2016-12-13 ENCOUNTER — Encounter: Payer: Self-pay | Admitting: Family Medicine

## 2016-12-13 ENCOUNTER — Ambulatory Visit: Payer: BLUE CROSS/BLUE SHIELD | Admitting: Family Medicine

## 2016-12-13 ENCOUNTER — Ambulatory Visit (INDEPENDENT_AMBULATORY_CARE_PROVIDER_SITE_OTHER): Payer: BLUE CROSS/BLUE SHIELD | Admitting: Family Medicine

## 2016-12-13 VITALS — BP 142/73 | HR 90 | Ht 64.75 in | Wt 193.3 lb

## 2016-12-13 DIAGNOSIS — L739 Follicular disorder, unspecified: Secondary | ICD-10-CM

## 2016-12-13 DIAGNOSIS — L0100 Impetigo, unspecified: Secondary | ICD-10-CM

## 2016-12-13 MED ORDER — SULFAMETHOXAZOLE-TRIMETHOPRIM 800-160 MG PO TABS: 2.0000 | ORAL_TABLET | Freq: Two times a day (BID) | ORAL | 0 refills | Status: DC

## 2016-12-13 MED ORDER — MUPIROCIN 2 % EX OINT: TOPICAL_OINTMENT | CUTANEOUS | 3 refills | Status: DC

## 2016-12-13 NOTE — Patient Instructions (Signed)
Thank you for coming in today. Take bactrim two pills twice daily.  Reduce to 1 pill twice daily if intolerant.  Use the nasal ointment on a qtip 2-3 times daily for 1 week.  Use Chlorhexidine body wash (hibiclens) daily in the shower for 2 weeks.   Recheck in 1 month.   If the breast redness does not improve or worsens return sooner.   Work on exercise.

## 2016-12-13 NOTE — Progress Notes (Signed)
[name] is a 61 y.o. female who presents to [facility]: [place] today for painful rash on breast. Patient notes a one-week history of a red painful bump on the inferior portion of her right medial breast. This is tender to palpation and has been worsening. She denies any fevers or chills. She's had similar areas previously that were a little bit larger than a pimple. She's been able to get those to express but this one has not "come to a head yet".  She feels well with no fevers or chills vomiting or diarrhea. In the past she had nausea with doxycycline for folliculitis treatment.  Usually she notes a crusty area and somewhat painful at the right nostril. This is been ongoing for about 2 or 3 weeks. She has not tried any medicines or treatment for this yet   Past Medical History:  Diagnosis Date  . Anxiety   . Asthma   . Chest pain, atypical   . Heart palpitations    hx of MVP  . IBS (irritable bowel syndrome)    Past Surgical History:  Procedure Laterality Date  . eAB     x2    Social History  Substance Use Topics  . Smoking status: Never Smoker  . Smokeless tobacco: Never Used     Comment: nonsmoker  . Alcohol use No   family history includes Heart attack in her father; Lung cancer in her mother.  ROS as above:  Medications: Current Outpatient Prescriptions  Medication Sig Dispense Refill  . AMBULATORY NON FORMULARY MEDICATION Freestyle promise meter Use daily as directed 1 each 0  . AMBULATORY NON FORMULARY MEDICATION Freestyle test strips To use with freestyle promise meter  Test blood sugar twice a day 100 each 11  . aspirin 81 MG tablet Take 1 tablet (81 mg total) by mouth daily. 30 tablet   . B Complex-C (B-COMPLEX WITH VITAMIN C) tablet Take 1 tablet by mouth daily.      . budesonide-formoterol (SYMBICORT) 160-4.5 MCG/ACT inhaler Inhale 2 puffs  into the lungs 2 (two) times daily. 3 Inhaler 3  . Calcium-Magnesium-Vitamin D (CALCIUM MAGNESIUM PO) Take by mouth.    . citalopram (CELEXA) 10 MG tablet Take 1 tablet (10 mg total) by mouth daily. 60 tablet 3  . clobetasol ointment (TEMOVATE) 8.41 % Apply 1 application topically 2 (two) times daily. 60 g 1  . Garlic Oil 3244 MG CAPS 1 capsule 2 (two) times daily.      . hydrOXYzine (ATARAX/VISTARIL) 25 MG tablet Take 1 tablet (25 mg total) by mouth 3 (three) times daily as needed for anxiety. 30 tablet 1  . lisinopril (PRINIVIL,ZESTRIL) 10 MG tablet TAKE 1 TABLET (10 MG TOTAL) BY MOUTH DAILY. 30 tablet 1  . Omega-3 Fatty Acids (FISH OIL) 1000 MG CAPS Take 1 capsule by mouth daily.      Marland Kitchen triamcinolone cream (KENALOG) 0.1 % Apply 1 application topically 2 (two) times daily. 453.6 g 2  . vitamin C (ASCORBIC ACID) 500 MG tablet Take 500 mg by mouth daily.      Marland Kitchen albuterol (PROAIR HFA) 108 (90 BASE) MCG/ACT inhaler Inhale 2 puffs into the lungs every 6 (six) hours as needed for wheezing. 1 Inhaler 6  . mupirocin ointment (BACTROBAN) 2 % Apply to nose BID for 7 days. 30 g 3  . sulfamethoxazole-trimethoprim (BACTRIM DS,SEPTRA DS) 800-160 MG tablet Take 2 tablets by mouth 2 (two) times daily.  28 tablet 0   No current facility-administered medications for this visit.    Allergies  Allergen Reactions  . Penicillins Shortness Of Breath    Reaction as a child  . Effexor [Venlafaxine]     "dopey"    Health Maintenance Health Maintenance  Topic Date Due  . PAP SMEAR  03/31/2017 (Originally 01/31/2016)  . INFLUENZA VACCINE  05/24/2017 (Originally 02/28/2017)  . TETANUS/TDAP  05/24/2017 (Originally 11/22/1973)  . Fecal DNA (Cologuard)  12/27/2017 (Originally 11/22/2004)  . MAMMOGRAM  02/10/2018  . Hepatitis C Screening  Completed  . HIV Screening  Completed     Exam:  BP (!) 142/73   Pulse 90   Ht 5' 4.75" (1.645 m)   Wt 193 lb 4.8 oz (87.7 kg)   SpO2 98%   BMI 32.42 kg/m  Gen: Well  NAD HEENT: EOMI,  MMM Crusted erythematous area with gold colored scale right nostril Lungs: Normal work of breathing. CTABL Heart: RRR no MRG Abd: NABS, Soft. Nondistended, Nontender Exts: Brisk capillary refill, warm and well perfused.  Skin right breast with 2 cm area of erythema with no palpable severe induration or fluctuance. The area is tender to palpation. No expressible pus. No breast masses are palpated.    No results found for this or any previous visit (from the past 72 hour(s)). No results found.    Assessment and Plan: 61 y.o. female with  Early abscess infection/folliculitis/cellulitis right breast.  There does not appear to be a palpable area of fluctuance that is drainable today. I think we've caught this just before it becomes an abscess. Plan for trial of oral antibiotics. She's had intolerances doxycycline will try Bactrim. Recheck if not better.  Impetigo right nostril treat with mupirocin antibiotic.    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 We'll eradicate MRSA in the nostril with mupirocin antibiotic ointment additionally we'll use Hibiclens body wash and oral antibiotics.   Recheck in one month. Recheck blood pressure at that time.   No orders of the defined types were placed in this encounter.  Meds ordered this encounter  Medications  . mupirocin ointment (BACTROBAN) 2 %    Sig: Apply to nose BID for 7 days.    Dispense:  30 g    Refill:  3  . sulfamethoxazole-trimethoprim (BACTRIM DS,SEPTRA DS) 800-160 MG tablet    Sig: Take 2 tablets by mouth 2 (two) times daily.    Dispense:  28 tablet    Refill:  0     Discussed warning signs or symptoms. Please see discharge instructions. Patient expresses understanding.

## 2016-12-14 ENCOUNTER — Encounter: Payer: Self-pay | Admitting: Family Medicine

## 2016-12-15 ENCOUNTER — Encounter: Payer: Self-pay | Admitting: Sports Medicine

## 2016-12-15 ENCOUNTER — Encounter: Payer: Self-pay | Admitting: Family Medicine

## 2016-12-15 ENCOUNTER — Ambulatory Visit (INDEPENDENT_AMBULATORY_CARE_PROVIDER_SITE_OTHER): Payer: BLUE CROSS/BLUE SHIELD | Admitting: Sports Medicine

## 2016-12-15 DIAGNOSIS — N61 Mastitis without abscess: Secondary | ICD-10-CM | POA: Diagnosis not present

## 2016-12-15 MED ORDER — CLINDAMYCIN HCL 300 MG PO CAPS: 300.0000 mg | ORAL_CAPSULE | Freq: Three times a day (TID) | ORAL | 0 refills | Status: DC

## 2016-12-15 NOTE — Patient Instructions (Signed)

## 2016-12-15 NOTE — Progress Notes (Signed)
  Subjective:    CC: Skin infection  HPI: For the past week or so this 61 year old female has had increasing redness under her right breast, she was seen by Dr. Georgina Snell and treated appropriately with doxycycline which she did not tolerate, and subsequently with Septra which she also did not tolerate. Symptoms are very mild, and she is just overall anxious about medications in general.  Past medical history:  Negative.  See flowsheet/record as well for more information.  Surgical history: Negative.  See flowsheet/record as well for more information.  Family history: Negative.  See flowsheet/record as well for more information.  Social history: Negative.  See flowsheet/record as well for more information.  Allergies, and medications have been entered into the medical record, reviewed, and no changes needed.   Review of Systems: No fevers, chills, night sweats, weight loss, chest pain, or shortness of breath.   Objective:    General: Well Developed, well nourished, and in no acute distress.  Neuro: Alert and oriented x3, extra-ocular muscles intact, sensation grossly intact.  HEENT: Normocephalic, atraumatic, pupils equal round reactive to light, neck supple, no masses, no lymphadenopathy, thyroid nonpalpable.  Skin: Warm and dry, There is an area of cellulitis under the right breast, no palpable collections or fluctuance. Area of erythema and induration is about 3 inches across. Cardiac: Regular rate and rhythm, no murmurs rubs or gallops, no lower extremity edema.  Respiratory: Clear to auscultation bilaterally. Not using accessory muscles, speaking in full sentences.  Impression and Recommendations:    Cellulitis of right breast Only took doxycycline for 2 days, had some abdominal pain, did not tolerate Septra, we are going to switch to clindamycin, this is the only other oral antibiotic we have that would cover MRSA. I have asked her to push to any adverse effects and take the medication  with food. Warm compresses multiple times per day.  Return in one week to recheck infection.  I spent 25 minutes with this patient, greater than 50% was face-to-face time counseling regarding the above diagnoses

## 2016-12-15 NOTE — Telephone Encounter (Signed)
Pt spoke with the scheduler and an appt was made.

## 2016-12-15 NOTE — Assessment & Plan Note (Signed)
Only took doxycycline for 2 days, had some abdominal pain, did not tolerate Septra, we are going to switch to clindamycin, this is the only other oral antibiotic we have that would cover MRSA. I have asked her to push to any adverse effects and take the medication with food. Warm compresses multiple times per day.  Return in one week to recheck infection.

## 2016-12-16 ENCOUNTER — Encounter: Payer: Self-pay | Admitting: Sports Medicine

## 2016-12-17 ENCOUNTER — Encounter: Payer: Self-pay | Admitting: Family Medicine

## 2016-12-22 ENCOUNTER — Encounter: Payer: Self-pay | Admitting: Family Medicine

## 2016-12-22 DIAGNOSIS — N61 Mastitis without abscess: Secondary | ICD-10-CM

## 2016-12-22 MED ORDER — CLINDAMYCIN HCL 300 MG PO CAPS: 300.0000 mg | ORAL_CAPSULE | Freq: Three times a day (TID) | ORAL | 0 refills | Status: DC

## 2017-01-15 ENCOUNTER — Ambulatory Visit: Payer: BLUE CROSS/BLUE SHIELD | Admitting: Obstetrics & Gynecology

## 2017-01-24 ENCOUNTER — Encounter: Payer: Self-pay | Admitting: Family Medicine

## 2017-01-24 NOTE — Telephone Encounter (Signed)
.   I will refer this matter to the office manager.

## 2017-01-26 ENCOUNTER — Other Ambulatory Visit: Payer: Self-pay | Admitting: Family Medicine

## 2017-01-29 ENCOUNTER — Other Ambulatory Visit: Payer: Self-pay

## 2017-01-29 MED ORDER — CITALOPRAM HYDROBROMIDE 10 MG PO TABS: 10.0000 mg | ORAL_TABLET | Freq: Every day | ORAL | 0 refills | Status: DC

## 2017-02-06 ENCOUNTER — Ambulatory Visit: Payer: BLUE CROSS/BLUE SHIELD | Admitting: Obstetrics & Gynecology

## 2017-02-08 ENCOUNTER — Encounter: Payer: Self-pay | Admitting: Family Medicine

## 2017-02-09 DIAGNOSIS — M9902 Segmental and somatic dysfunction of thoracic region: Secondary | ICD-10-CM | POA: Diagnosis not present

## 2017-02-09 DIAGNOSIS — M9901 Segmental and somatic dysfunction of cervical region: Secondary | ICD-10-CM | POA: Diagnosis not present

## 2017-02-09 DIAGNOSIS — M50122 Cervical disc disorder at C5-C6 level with radiculopathy: Secondary | ICD-10-CM | POA: Diagnosis not present

## 2017-02-09 DIAGNOSIS — M50123 Cervical disc disorder at C6-C7 level with radiculopathy: Secondary | ICD-10-CM | POA: Diagnosis not present

## 2017-02-12 ENCOUNTER — Ambulatory Visit: Payer: BLUE CROSS/BLUE SHIELD | Admitting: Family Medicine

## 2017-02-19 ENCOUNTER — Encounter: Payer: Self-pay | Admitting: Family Medicine

## 2017-02-20 ENCOUNTER — Ambulatory Visit: Payer: BLUE CROSS/BLUE SHIELD | Admitting: Family Medicine

## 2017-03-07 ENCOUNTER — Encounter: Payer: Self-pay | Admitting: Family Medicine

## 2017-03-07 ENCOUNTER — Ambulatory Visit (INDEPENDENT_AMBULATORY_CARE_PROVIDER_SITE_OTHER): Payer: BLUE CROSS/BLUE SHIELD | Admitting: Family Medicine

## 2017-03-07 VITALS — BP 128/74 | HR 78 | Ht 64.0 in | Wt 200.0 lb

## 2017-03-07 DIAGNOSIS — F419 Anxiety disorder, unspecified: Secondary | ICD-10-CM | POA: Diagnosis not present

## 2017-03-07 DIAGNOSIS — J4520 Mild intermittent asthma, uncomplicated: Secondary | ICD-10-CM

## 2017-03-07 DIAGNOSIS — R7303 Prediabetes: Secondary | ICD-10-CM | POA: Diagnosis not present

## 2017-03-07 LAB — POCT GLYCOSYLATED HEMOGLOBIN (HGB A1C): Hemoglobin A1C: 6.3

## 2017-03-07 MED ORDER — CLOBETASOL PROPIONATE 0.05 % EX OINT: 1.0000 "application " | TOPICAL_OINTMENT | Freq: Two times a day (BID) | CUTANEOUS | 12 refills | Status: DC

## 2017-03-07 MED ORDER — TRIAMCINOLONE ACETONIDE 0.1 % EX CREA: 1.0000 "application " | TOPICAL_CREAM | Freq: Two times a day (BID) | CUTANEOUS | 12 refills | Status: DC

## 2017-03-07 MED ORDER — ALBUTEROL SULFATE HFA 108 (90 BASE) MCG/ACT IN AERS: 2.0000 | INHALATION_SPRAY | Freq: Four times a day (QID) | RESPIRATORY_TRACT | 6 refills | Status: DC | PRN

## 2017-03-07 NOTE — Patient Instructions (Addendum)
Thank you for coming in today. Apply the tub of triamcinolone cream to the arms.  Use clobetasol ointment on the hot spots.    Try to eat a reduced carb diet (60g of carbs per meal or less).    Recheck with me in 3-4 weeks to follow up rash and stress.   You should hear about therapy soon.    Eczema Eczema, also called atopic dermatitis, is a skin disorder that causes inflammation of the skin. It causes a red rash and dry, scaly skin. The skin becomes very itchy. Eczema is generally worse during the cooler winter months and often improves with the warmth of summer. Eczema usually starts showing signs in infancy. Some children outgrow eczema, but it may last through adulthood. What are the causes? The exact cause of eczema is not known, but it appears to run in families. People with eczema often have a family history of eczema, allergies, asthma, or hay fever. Eczema is not contagious. Flare-ups of the condition may be caused by:  Contact with something you are sensitive or allergic to.  Stress.  What are the signs or symptoms?  Dry, scaly skin.  Red, itchy rash.  Itchiness. This may occur before the skin rash and may be very intense. How is this diagnosed? The diagnosis of eczema is usually made based on symptoms and medical history. How is this treated? Eczema cannot be cured, but symptoms usually can be controlled with treatment and other strategies. A treatment plan might include:  Controlling the itching and scratching. ? Use over-the-counter antihistamines as directed for itching. This is especially useful at night when the itching tends to be worse. ? Use over-the-counter steroid creams as directed for itching. ? Avoid scratching. Scratching makes the rash and itching worse. It may also result in a skin infection (impetigo) due to a break in the skin caused by scratching.  Keeping the skin well moisturized with creams every day. This will seal in moisture and help  prevent dryness. Lotions that contain alcohol and water should be avoided because they can dry the skin.  Limiting exposure to things that you are sensitive or allergic to (allergens).  Recognizing situations that cause stress.  Developing a plan to manage stress.  Follow these instructions at home:  Only take over-the-counter or prescription medicines as directed by your health care provider.  Do not use anything on the skin without checking with your health care provider.  Keep baths or showers short (5 minutes) in warm (not hot) water. Use mild cleansers for bathing. These should be unscented. You may add nonperfumed bath oil to the bath water. It is best to avoid soap and bubble bath.  Immediately after a bath or shower, when the skin is still damp, apply a moisturizing ointment to the entire body. This ointment should be a petroleum ointment. This will seal in moisture and help prevent dryness. The thicker the ointment, the better. These should be unscented.  Keep fingernails cut short. Children with eczema may need to wear soft gloves or mittens at night after applying an ointment.  Dress in clothes made of cotton or cotton blends. Dress lightly, because heat increases itching.  A child with eczema should stay away from anyone with fever blisters or cold sores. The virus that causes fever blisters (herpes simplex) can cause a serious skin infection in children with eczema. Contact a health care provider if:  Your itching interferes with sleep.  Your rash gets worse or is not better  within 1 week after starting treatment.  You see pus or soft yellow scabs in the rash area.  You have a fever.  You have a rash flare-up after contact with someone who has fever blisters. This information is not intended to replace advice given to you by your health care provider. Make sure you discuss any questions you have with your health care provider. Document Released: 07/14/2000 Document  Revised: 12/23/2015 Document Reviewed: 02/17/2013 Elsevier Interactive Patient Education  2017 [employer].

## 2017-03-07 NOTE — Progress Notes (Signed)
[name] is a 61 y.o. female who presents to [facility] today for rash anxiety and prediabetes.  Rash: Patient has a history of atopic dermatitis and notes worsening rash on her flexor elbow his knees and on her dorsal hands. She notes that she is itchy. She has not used any medications yet. She has used some over-the-counter lotions which have not helped much. She denies any new soaps detergents or shampoos or medications. No fevers or chills.  Anxiety: Patient is worsening anxiety symptoms. Her symptoms was recently diagnosed with rectal cancer. She notes this has worsened her anxiety. She continues to take citalopram 10 mg daily. She is interested in counseling if possible. No SI or HI expressed.  Diabetes: Patient has had a long history of prediabetes. She notes that she has been not following a regular consistent diet recently and has gained a bit of weight. She denies any polyuria or polydipsia.   Past Medical History:  Diagnosis Date  . Anxiety   . Asthma   . Chest pain, atypical   . Heart palpitations    hx of MVP  . IBS (irritable bowel syndrome)    Past Surgical History:  Procedure Laterality Date  . eAB     x2    Social History  Substance Use Topics  . Smoking status: Never Smoker  . Smokeless tobacco: Never Used     Comment: nonsmoker  . Alcohol use No   family history includes Heart attack in her father; Lung cancer in her mother.  ROS as above:  Medications: Current Outpatient Prescriptions  Medication Sig Dispense Refill  . AMBULATORY NON FORMULARY MEDICATION Freestyle promise meter Use daily as directed 1 each 0  . AMBULATORY NON FORMULARY MEDICATION Freestyle test strips To use with freestyle promise meter  Test blood sugar twice a day 100 each 11  . aspirin 81 MG tablet Take 1 tablet (81 mg total) by mouth daily. 30 tablet   .  B Complex-C (B-COMPLEX WITH VITAMIN C) tablet Take 1 tablet by mouth daily.      . budesonide-formoterol (SYMBICORT) 160-4.5 MCG/ACT inhaler Inhale 2 puffs into the lungs 2 (two) times daily. 3 Inhaler 3  . Calcium-Magnesium-Vitamin D (CALCIUM MAGNESIUM PO) Take by mouth.    . citalopram (CELEXA) 10 MG tablet Take 1 tablet (10 mg total) by mouth daily. 90 tablet 0  . clindamycin (CLEOCIN) 300 MG capsule Take 1 capsule (300 mg total) by mouth 3 (three) times daily. 21 capsule 0  . clobetasol ointment (TEMOVATE) 6.07 % Apply 1 application topically 2 (two) times daily. 60 g 1  . Garlic Oil 3710 MG CAPS 1 capsule 2 (two) times daily.      . hydrOXYzine (ATARAX/VISTARIL) 25 MG tablet Take 1 tablet (25 mg total) by mouth 3 (three) times daily as needed for anxiety. 30 tablet 1  . lisinopril (PRINIVIL,ZESTRIL) 10 MG tablet TAKE 1 TABLET (10 MG TOTAL) BY MOUTH DAILY. 30 tablet 1  . mupirocin ointment (BACTROBAN) 2 % Apply to nose BID for 7 days. 30 g 3  . Omega-3 Fatty Acids (FISH OIL) 1000 MG CAPS Take 1 capsule by mouth daily.      . vitamin C (ASCORBIC ACID) 500 MG tablet Take 500 mg by mouth daily.      Marland Kitchen albuterol (PROAIR HFA) 108 (90 Base) MCG/ACT inhaler Inhale 2 puffs into the lungs every 6 (six) hours as needed for wheezing. 1  Inhaler 6  . clobetasol ointment (TEMOVATE) 5.00 % Apply 1 application topically 2 (two) times daily. 60 g 12  . triamcinolone cream (KENALOG) 0.1 % Apply 1 application topically 2 (two) times daily. 453.6 g 12   No current facility-administered medications for this visit.    Allergies  Allergen Reactions  . Penicillins Shortness Of Breath    Reaction as a child  . Effexor [Venlafaxine]     "dopey"    Health Maintenance Health Maintenance  Topic Date Due  . PAP SMEAR  03/31/2017 (Originally 01/31/2016)  . INFLUENZA VACCINE  05/24/2017 (Originally 02/28/2017)  . TETANUS/TDAP  05/24/2017 (Originally 11/22/1973)  . Fecal DNA (Cologuard)  12/27/2017 (Originally  11/22/2004)  . MAMMOGRAM  02/10/2018  . Hepatitis C Screening  Completed  . HIV Screening  Completed     Exam:  BP 128/74   Pulse 78   Ht 5\' 4"  (1.626 m)   Wt 200 lb (90.7 kg)   BMI 34.33 kg/m   Wt Readings from Last 5 Encounters:  03/07/17 200 lb (90.7 kg)  12/15/16 190 lb (86.2 kg)  12/13/16 193 lb 4.8 oz (87.7 kg)  08/29/16 185 lb (83.9 kg)  08/25/16 190 lb (86.2 kg)    Gen: Well NAD HEENT: EOMI,  MMM Lungs: Normal work of breathing. CTABL Heart: RRR no MRG Abd: NABS, Soft. Nondistended, Nontender Exts: Brisk capillary refill, warm and well perfused.  Skin: Thickened erythematous skin with excoriation and flexor elbows and knees bilaterally with maculopapular rash on the dorsal hands bilaterally. This is consistent with irritated atopic dermatitis and dyshidrotic eczema. Psych: Alert and oriented normal speech thought process and affect. No SI or HI expressed.  GAD 7 : Generalized Anxiety Score 03/07/2017 08/29/2016  Nervous, Anxious, on Edge 3 1  Control/stop worrying 3 3  Worry too much - different things 3 3  Trouble relaxing 2 2  Restless 2 2  Easily annoyed or irritable 2 2  Afraid - awful might happen 3 2  Total GAD 7 Score 18 15  Anxiety Difficulty Very difficult Very difficult    Depression screen [facility] 2/9 03/07/2017 03/07/2017 08/29/2016  Decreased Interest 0 2 2  Down, Depressed, Hopeless 2 1 2   PHQ - 2 Score 2 3 4   Altered sleeping 3 3 1   Tired, decreased energy 2 1 1   Change in appetite 2 2 2   Feeling bad or failure about yourself  1 2 0  Trouble concentrating 0 0 0  Moving slowly or fidgety/restless 1 0 2  Suicidal thoughts 0 0 0  PHQ-9 Score 11 11 10       Results for orders placed or performed in visit on 03/07/17 (from the past 72 hour(s))  POCT glycosylated hemoglobin (Hb A1C)     Status: None   Collection Time: 03/07/17  9:56 AM  Result Value Ref Range   Hemoglobin A1C 6.3    No results found.    Assessment and Plan: 61 y.o. female with    Rash: Eczema type. Plan to treat with triamcinolone cream and clobetasol ointment. Recheck in a few weeks.  Anxiety: Worsened recently. Plan to continue current medications refer to counseling at behavioral health clinic.  Prediabetes: A1c slightly increased. Workup low carbohydrate diet. Recheck in a few weeks.   Orders Placed This Encounter  Procedures  . Ambulatory referral to Behavioral Health    Referral Priority:   Routine    Referral Type:   Psychiatric    Referral Reason:   Specialty Services  Required    Requested Specialty:   Behavioral Health    Number of Visits Requested:   1  . POCT glycosylated hemoglobin (Hb A1C)   Meds ordered this encounter  Medications  . albuterol (PROAIR HFA) 108 (90 Base) MCG/ACT inhaler    Sig: Inhale 2 puffs into the lungs every 6 (six) hours as needed for wheezing.    Dispense:  1 Inhaler    Refill:  6  . triamcinolone cream (KENALOG) 0.1 %    Sig: Apply 1 application topically 2 (two) times daily.    Dispense:  453.6 g    Refill:  12  . clobetasol ointment (TEMOVATE) 0.05 %    Sig: Apply 1 application topically 2 (two) times daily.    Dispense:  60 g    Refill:  12     Discussed warning signs or symptoms. Please see discharge instructions. Patient expresses understanding.

## 2017-03-08 ENCOUNTER — Encounter: Payer: Self-pay | Admitting: Family Medicine

## 2017-03-18 ENCOUNTER — Encounter: Payer: Self-pay | Admitting: Family Medicine

## 2017-03-20 ENCOUNTER — Encounter: Payer: Self-pay | Admitting: Family Medicine

## 2017-03-21 ENCOUNTER — Other Ambulatory Visit: Payer: Self-pay | Admitting: Family Medicine

## 2017-03-27 ENCOUNTER — Ambulatory Visit: Payer: BLUE CROSS/BLUE SHIELD | Admitting: Family Medicine

## 2017-03-28 ENCOUNTER — Ambulatory Visit (INDEPENDENT_AMBULATORY_CARE_PROVIDER_SITE_OTHER): Payer: BLUE CROSS/BLUE SHIELD | Admitting: Obstetrics & Gynecology

## 2017-03-28 ENCOUNTER — Other Ambulatory Visit: Payer: Self-pay | Admitting: Obstetrics & Gynecology

## 2017-03-28 ENCOUNTER — Encounter: Payer: Self-pay | Admitting: Obstetrics & Gynecology

## 2017-03-28 VITALS — BP 132/73 | HR 67 | Resp 16 | Ht 64.0 in | Wt 194.0 lb

## 2017-03-28 DIAGNOSIS — Z1151 Encounter for screening for human papillomavirus (HPV): Secondary | ICD-10-CM | POA: Diagnosis not present

## 2017-03-28 DIAGNOSIS — Z124 Encounter for screening for malignant neoplasm of cervix: Secondary | ICD-10-CM | POA: Diagnosis not present

## 2017-03-28 DIAGNOSIS — Z01419 Encounter for gynecological examination (general) (routine) without abnormal findings: Secondary | ICD-10-CM

## 2017-03-28 DIAGNOSIS — Z1231 Encounter for screening mammogram for malignant neoplasm of breast: Secondary | ICD-10-CM

## 2017-03-28 DIAGNOSIS — Z1239 Encounter for other screening for malignant neoplasm of breast: Secondary | ICD-10-CM

## 2017-03-28 DIAGNOSIS — Z1211 Encounter for screening for malignant neoplasm of colon: Secondary | ICD-10-CM

## 2017-03-28 NOTE — Progress Notes (Signed)
Subjective:     [name] is a 61 y.o. female here for a routine exam.  Current complaints: pt has lost 6# iin 3 weeks. Her son, Will was dx'd with stage III rectal cancer and she is worried.  She has not been screened for rectal or colon cancer.   She reports occ leakage of urine that doe not bother her. She knows about Kegel exercises and will consider doing them.     Gynecologic History No LMP recorded. Patient is postmenopausal. Contraception: post menopausal status Last Pap: 01/30/2013. Results were: normal Last mammogram: 01/2016. Results were: normal  Obstetric History OB History  Gravida Para Term Preterm AB Living  3 1     2     SAB TAB Ectopic Multiple Live Births    2          # Outcome Date GA Lbr Len/2nd Weight Sex Delivery Anes PTL Lv  3 TAB           2 TAB           1 Para                The following portions of the patient's history were reviewed and updated as appropriate: allergies, current medications, past family history, past medical history, past social history, past surgical history and problem list.  Review of Systems Pertinent items are noted in HPI.    Objective:  BP 132/73   Pulse 67   Resp 16   Ht 5\' 4"  (1.626 m)   Wt 194 lb (88 kg)   BMI 33.30 kg/m  General Appearance:    Alert, cooperative, no distress, appears stated age  Head:    Normocephalic, without obvious abnormality, atraumatic  Eyes:    conjunctiva/corneas clear, EOM's intact, both eyes  Ears:    Normal external ear canals, both ears  Nose:   Nares normal, septum midline, mucosa normal, no drainage    or sinus tenderness  Throat:   Lips, mucosa, and tongue normal; teeth and gums normal  Neck:   Supple, symmetrical, trachea midline, no adenopathy;    thyroid:  no enlargement/tenderness/nodules  Back:     Symmetric, no curvature, ROM normal, no CVA tenderness  Lungs:     Clear to auscultation bilaterally, respirations unlabored  Chest Wall:    No tenderness or deformity   Heart:     Regular rate and rhythm, S1 and S2 normal, no murmur, rub   or gallop  Breast Exam:    No tenderness, masses, or nipple abnormality- the right breast is considerably larger than the left but, this is not new  Abdomen:     Soft, non-tender, bowel sounds active all four quadrants,    no masses, no organomegaly  Genitalia:    Normal female without lesion, discharge or tenderness; vulvar atrophy     Extremities:   Extremities normal, atraumatic, no cyanosis or edema  Pulses:   2+ and symmetric all extremities  Skin:   Skin color, texture, turgor normal, no rashes or lesions       Assessment:    Healthy female exam.   Post menopausal state Breast ca screening Colon cancer screening   Plan:   Rec colonoscopy Screening mammogram F/u in 1 year or sooner prn F/u PAP with hrHPV Rec walking for exercise  Shakenna Herrero L. Harraway-Smith, M.D., Cherlynn June

## 2017-03-30 LAB — CYTOLOGY - PAP
DIAGNOSIS: NEGATIVE
HPV (WINDOPATH): NOT DETECTED

## 2017-04-05 ENCOUNTER — Ambulatory Visit: Payer: BLUE CROSS/BLUE SHIELD | Admitting: Family Medicine

## 2017-04-06 ENCOUNTER — Ambulatory Visit: Payer: BLUE CROSS/BLUE SHIELD | Admitting: Family Medicine

## 2017-04-09 ENCOUNTER — Encounter: Payer: Self-pay | Admitting: Family Medicine

## 2017-04-13 ENCOUNTER — Ambulatory Visit: Payer: BLUE CROSS/BLUE SHIELD

## 2017-04-25 ENCOUNTER — Ambulatory Visit: Payer: BLUE CROSS/BLUE SHIELD | Admitting: Family Medicine

## 2017-04-27 ENCOUNTER — Ambulatory Visit: Payer: BLUE CROSS/BLUE SHIELD | Admitting: Family Medicine

## 2017-05-03 ENCOUNTER — Encounter: Payer: Self-pay | Admitting: Family Medicine

## 2017-05-03 ENCOUNTER — Ambulatory Visit (INDEPENDENT_AMBULATORY_CARE_PROVIDER_SITE_OTHER): Payer: BLUE CROSS/BLUE SHIELD | Admitting: Family Medicine

## 2017-05-03 VITALS — BP 154/73 | HR 82 | Ht 64.0 in | Wt 196.0 lb

## 2017-05-03 DIAGNOSIS — Z6830 Body mass index (BMI) 30.0-30.9, adult: Secondary | ICD-10-CM | POA: Insufficient documentation

## 2017-05-03 DIAGNOSIS — I10 Essential (primary) hypertension: Secondary | ICD-10-CM

## 2017-05-03 DIAGNOSIS — L2082 Flexural eczema: Secondary | ICD-10-CM

## 2017-05-03 DIAGNOSIS — Z6833 Body mass index (BMI) 33.0-33.9, adult: Secondary | ICD-10-CM | POA: Diagnosis not present

## 2017-05-03 DIAGNOSIS — L439 Lichen planus, unspecified: Secondary | ICD-10-CM | POA: Diagnosis not present

## 2017-05-03 MED ORDER — FLUOCINONIDE 0.05 % EX OINT: 1.0000 "application " | TOPICAL_OINTMENT | Freq: Two times a day (BID) | CUTANEOUS | 1 refills | Status: DC

## 2017-05-03 NOTE — Patient Instructions (Signed)
Thank you for coming in today. Apply the high potency ointment to the hot spots.  Continue the tub of cream .  Let me know if the Lidex ointment is too expensive.   For blood pressure work on improved diet and exercise.  We will recheck blood pressure in about 2 months.  If still elevated we may consider adding blood pressure medicine or increasing dose.    Hydrochlorothiazide, HCTZ; Lisinopril tablets What is this medicine? HYDROCHLOROTHIAZIDE; LISINOPRIL (hye droe klor oh THYE a zide; lyse IN oh pril) is a combination of a diuretic and an ACE inhibitor. It is used to treat high blood pressure. This medicine may be used for other purposes; ask your health care provider or pharmacist if you have questions. COMMON BRAND NAME(S): Prinzide, Zestoretic What should I tell my health care provider before I take this medicine? They need to know if you have any of these conditions: -bone marrow disease -decreased urine -heart or blood vessel disease -if you are on a special diet like a low salt diet -immune system problems, like lupus -kidney disease -liver disease -previous swelling of the tongue, face, or lips with difficulty breathing, difficulty swallowing, hoarseness, or tightening of the throat -recent heart attack or stroke -an unusual or allergic reaction to lisinopril, hydrochlorothiazide, sulfa drugs, other medicines, insect venom, foods, dyes, or preservatives -pregnant or trying to get pregnant -breast-feeding How should I use this medicine? Take this medicine by mouth with a glass of water. Follow the directions on the prescription label. You can take it with or without food. If it upsets your stomach, take it with food. Take your medicine at regular intervals. Do not take it more often than directed. Do not stop taking except on your doctor's advice. Talk to your pediatrician regarding the use of this medicine in children. Special care may be needed. Overdosage: If you think you  have taken too much of this medicine contact a poison control center or emergency room at once. NOTE: This medicine is only for you. Do not share this medicine with others. What if I miss a dose? If you miss a dose, take it as soon as you can. If it is almost time for your next dose, take only that dose. Do not take double or extra doses. What may interact with this medicine? Do not take this medication with any of the following medications: -sacubitril; valsartan This medicine may also interact with the following: -barbiturates like phenobarbital -blood pressure medicines -corticosteroids like prednisone -diabetic medications -diuretics, especially triamterene, spironolactone or amiloride -lithium -NSAIDs, medicines for pain and inflammation, like ibuprofen or naproxen -potassium salts or potassium supplements -prescription pain medicines -skeletal muscle relaxants like tubocurarine -some cholesterol lowering medications like cholestyramine or colestipol This list may not describe all possible interactions. Give your health care provider a list of all the medicines, herbs, non-prescription drugs, or dietary supplements you use. Also tell them if you smoke, drink alcohol, or use illegal drugs. Some items may interact with your medicine. What should I watch for while using this medicine? Visit your doctor or health care professional for regular checks on your progress. Check your blood pressure as directed. Ask your doctor or health care professional what your blood pressure should be and when you should contact him or her. Call your doctor or health care professional if you notice an irregular or fast heart beat. You must not get dehydrated. Ask your doctor or health care professional how much fluid you need to drink a  day. Check with him or her if you get an attack of severe diarrhea, nausea and vomiting, or if you sweat a lot. The loss of too much body fluid can make it dangerous for you to  take this medicine. Women should inform their doctor if they wish to become pregnant or think they might be pregnant. There is a potential for serious side effects to an unborn child. Talk to your health care professional or pharmacist for more information. You may get drowsy or dizzy. Do not drive, use machinery, or do anything that needs mental alertness until you know how this drug affects you. Do not stand or sit up quickly, especially if you are an older patient. This reduces the risk of dizzy or fainting spells. Alcohol can make you more drowsy and dizzy. Avoid alcoholic drinks. This medicine may affect your blood sugar level. If you have diabetes, check with your doctor or health care professional before changing the dose of your diabetic medicine. Avoid salt substitutes unless you are told otherwise by your doctor or health care professional. This medicine can make you more sensitive to the sun. Keep out of the sun. If you cannot avoid being in the sun, wear protective clothing and use sunscreen. Do not use sun lamps or tanning beds/booths. Do not treat yourself for coughs, colds, or pain while you are taking this medicine without asking your doctor or health care professional for advice. Some ingredients may increase your blood pressure. What side effects may I notice from receiving this medicine? Side effects that you should report to your doctor or health care professional as soon as possible: -changes in vision -confusion, dizziness, light headedness or fainting spells -decreased amount of urine passed -difficulty breathing or swallowing, hoarseness, or tightening of the throat -eye pain -fast or irregular heart beat, palpitations, or chest pain -muscle cramps -nausea and vomiting -persistent dry cough -redness, blistering, peeling or loosening of the skin, including inside the mouth -stomach pain -swelling of your face, lips, tongue, hands, or feet -unusual rash, bleeding or  bruising, or pinpoint red spots on the skin -worsened gout pain -yellowing of the eyes or skin Side effects that usually do not require medical attention (report to your doctor or health care professional if they continue or are bothersome): -change in sex drive or performance -cough -headache This list may not describe all possible side effects. Call your doctor for medical advice about side effects. You may report side effects to FDA at 1-800-FDA-1088. Where should I keep my medicine? Keep out of the reach of children. Store at room temperature between 20 and 25 degrees C (68 and 77 degrees F). Protect from moisture and excessive light. Keep container tightly closed. Throw away any unused medicine after the expiration date. NOTE: This sheet is a summary. It may not cover all possible information. If you have questions about this medicine, talk to your doctor, pharmacist, or health care provider.  2018 Elsevier/Gold Standard (2015-09-10 11:42:20)

## 2017-05-03 NOTE — Progress Notes (Signed)
[name] is a 61 y.o. female who presents to [place]: [facility] today for rash, HTN and weight gain.   Rash: Patient notes a pruritic rash on her bilateral arms. She's had this before and thought to be due to eczema. She notes is been worsening recently despite triamcinolone cream. She denies any fevers or chills nausea vomiting or diarrhea. She denies any new soaps detergents or shampoos or cosmetics.  HTN: Patient continues to take lisinopril for her hypertension. She notes that her diet has been worsening recently and she's gained some weight. She denies chest pain palpitations or shortness of breath. She would like to try last change before increasing or changing her blood pressure medication. She tolerates the medicine well.  Weight Gain: As noted above patient has had a less controlled diet recently and has gained some weight. She feels well otherwise.  Past Medical History:  Diagnosis Date  . Anxiety   . Asthma   . Chest pain, atypical   . Heart palpitations    hx of MVP  . IBS (irritable bowel syndrome)    Past Surgical History:  Procedure Laterality Date  . eAB     x2    Social History  Substance Use Topics  . Smoking status: Never Smoker  . Smokeless tobacco: Never Used     Comment: nonsmoker  . Alcohol use No   family history includes Heart attack in her father; Lung cancer in her mother.  ROS as above:  Medications: Current Outpatient Prescriptions  Medication Sig Dispense Refill  . albuterol (PROAIR HFA) 108 (90 Base) MCG/ACT inhaler Inhale 2 puffs into the lungs every 6 (six) hours as needed for wheezing. 1 Inhaler 6  . AMBULATORY NON FORMULARY MEDICATION Freestyle promise meter Use daily as directed 1 each 0  . AMBULATORY NON FORMULARY MEDICATION Freestyle test strips To use with freestyle promise meter  Test blood sugar twice a day 100  each 11  . aspirin 81 MG tablet Take 1 tablet (81 mg total) by mouth daily. 30 tablet   . B Complex-C (B-COMPLEX WITH VITAMIN C) tablet Take 1 tablet by mouth daily.      . budesonide-formoterol (SYMBICORT) 160-4.5 MCG/ACT inhaler Inhale 2 puffs into the lungs 2 (two) times daily. 3 Inhaler 3  . Calcium-Magnesium-Vitamin D (CALCIUM MAGNESIUM PO) Take by mouth.    . citalopram (CELEXA) 10 MG tablet Take 1 tablet (10 mg total) by mouth daily. 90 tablet 0  . clindamycin (CLEOCIN) 300 MG capsule Take 1 capsule (300 mg total) by mouth 3 (three) times daily. 21 capsule 0  . Garlic Oil 1062 MG CAPS 1 capsule 2 (two) times daily.      . hydrOXYzine (ATARAX/VISTARIL) 25 MG tablet Take 1 tablet (25 mg total) by mouth 3 (three) times daily as needed for anxiety. 30 tablet 1  . lisinopril (PRINIVIL,ZESTRIL) 10 MG tablet TAKE 1 TABLET (10 MG TOTAL) BY MOUTH DAILY. 30 tablet 1  . mupirocin ointment (BACTROBAN) 2 % Apply to nose BID for 7 days. 30 g 3  . Omega-3 Fatty Acids (FISH OIL) 1000 MG CAPS Take 1 capsule by mouth daily.      Marland Kitchen triamcinolone cream (KENALOG) 0.1 % Apply 1 application topically 2 (two) times daily. 453.6 g 12  . vitamin C (ASCORBIC ACID) 500 MG tablet Take 500 mg by mouth daily.      . fluocinonide ointment (LIDEX) 6.94 % Apply 1 application  topically 2 (two) times daily. 30 g 1   No current facility-administered medications for this visit.    Allergies  Allergen Reactions  . Penicillins Shortness Of Breath    Reaction as a child  . Effexor [Venlafaxine]     "dopey"    Health Maintenance Health Maintenance  Topic Date Due  . INFLUENZA VACCINE  05/24/2017 (Originally 02/28/2017)  . TETANUS/TDAP  05/24/2017 (Originally 11/22/1974)  . Fecal DNA (Cologuard)  12/27/2017 (Originally 11/22/2004)  . MAMMOGRAM  02/10/2018  . PAP SMEAR  03/28/2020  . Hepatitis C Screening  Completed  . HIV Screening  Completed     Exam:  BP (!) 154/73   Pulse 82   Ht 5\' 4"  (1.626 m)   Wt 196 lb  (88.9 kg)   BMI 33.64 kg/m   Wt Readings from Last 5 Encounters:  05/03/17 196 lb (88.9 kg)  03/28/17 194 lb (88 kg)  03/07/17 200 lb (90.7 kg)  12/15/16 190 lb (86.2 kg)  12/13/16 193 lb 4.8 oz (87.7 kg)    Gen: Well NAD HEENT: EOMI,  MMM Lungs: Normal work of breathing. CTABL Heart: RRR no MRG Abd: NABS, Soft. Nondistended, Nontender Exts: Brisk capillary refill, warm and well perfused.  Skin: Pruritic macular to papular purple small circular lesions seen on forearm. These are excoriated. Additionally patient has areas of larger less erythematous patches more consistent with atopic dermatitis in her flexor elbows bilaterally.   No results found for this or any previous visit (from the past 72 hour(s)). No results found.    Assessment and Plan: 61 y.o. 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 Use Lidex ointment as well as triamcinolone cream and recheck in a month.  Hypertension worsening recently in the setting of worsening lifestyle. Plan to work on lifestyle change and recheck in a month still bad will likely add HCTZ.   Weight Gain: As noted above likely due to lifestyle change. Recheck in 1 -2 months after improved diet and exercise.    No orders of the defined types were placed in this encounter.  Meds ordered this encounter  Medications  . fluocinonide ointment (LIDEX) 0.05 %    Sig: Apply 1 application topically 2 (two) times daily.    Dispense:  30 g    Refill:  1     Discussed warning signs or symptoms. Please see discharge instructions. Patient expresses understanding.

## 2017-05-06 ENCOUNTER — Encounter: Payer: Self-pay | Admitting: Family Medicine

## 2017-05-07 ENCOUNTER — Encounter: Payer: Self-pay | Admitting: Family Medicine

## 2017-05-10 ENCOUNTER — Telehealth: Payer: Self-pay | Admitting: Cardiovascular Disease

## 2017-05-10 NOTE — Telephone Encounter (Signed)
Left detailed message for patient to follow up with her PCP.

## 2017-05-10 NOTE — Telephone Encounter (Signed)
New message     Pt c/o medication issue:  1. Name of Medication: low dose aspirin   2. How are you currently taking this medication (dosage and times per day)?  1 a day   3. Are you having a reaction (difficulty breathing--STAT)?  yes  4. What is your medication issue? She stopped the aspirin and now she is having dizziness , and her bp seems to be elevated

## 2017-05-10 NOTE — Telephone Encounter (Signed)
Needs to f/u with PCP

## 2017-05-10 NOTE — Telephone Encounter (Signed)
Called patient back. Patient has not been seen since 2017, and was to follow-up as needed. Patient stated she has been walking and eating better. Patient complaining of dizziness, elevated BP, sweating when stressed, and having issues with taking ASA. Patient stated at time her SBP is up in the 140's, but most of the time it's 120's. Patient stated she can only take ASA 81 mg cherry chewable [facility] brand. Patient stated since [facility] closed she has tried other brands and discovered that it causes stomach upset. Informed patient she should try the coated ASA instead and make sure she takes medication with food. Patient stated she is stressed out, and she just found out that her son has stage III rectal cancer. Asked patient if she had seen her PCP. Patient stated she has seen her PCP recently, and he is aware of her situation. Encouraged patient to keep taking her ASA. Patient stated she felt like Dr. Johnsie Cancel knows her better and she just wanted to make him aware of her situation. Will forward to Dr. Johnsie Cancel for further advisement.

## 2017-05-22 ENCOUNTER — Encounter: Payer: Self-pay | Admitting: Family Medicine

## 2017-05-26 ENCOUNTER — Other Ambulatory Visit: Payer: Self-pay | Admitting: Family Medicine

## 2017-05-26 ENCOUNTER — Encounter: Payer: Self-pay | Admitting: Family Medicine

## 2017-05-28 MED ORDER — CITALOPRAM HYDROBROMIDE 10 MG PO TABS: 10.0000 mg | ORAL_TABLET | Freq: Every day | ORAL | 0 refills | Status: DC

## 2017-05-28 MED ORDER — LISINOPRIL 10 MG PO TABS: 10.0000 mg | ORAL_TABLET | Freq: Every day | ORAL | 0 refills | Status: DC

## 2017-05-29 ENCOUNTER — Ambulatory Visit: Payer: BLUE CROSS/BLUE SHIELD | Admitting: Obstetrics & Gynecology

## 2017-06-06 ENCOUNTER — Ambulatory Visit: Payer: BLUE CROSS/BLUE SHIELD | Admitting: Physician Assistant

## 2017-06-06 ENCOUNTER — Ambulatory Visit: Payer: BLUE CROSS/BLUE SHIELD

## 2017-06-06 ENCOUNTER — Encounter: Payer: Self-pay | Admitting: Family Medicine

## 2017-06-06 ENCOUNTER — Encounter: Payer: Self-pay | Admitting: Physician Assistant

## 2017-06-06 VITALS — BP 134/71 | HR 78 | Wt 196.2 lb

## 2017-06-06 DIAGNOSIS — M542 Cervicalgia: Secondary | ICD-10-CM | POA: Diagnosis not present

## 2017-06-06 DIAGNOSIS — S0990XA Unspecified injury of head, initial encounter: Secondary | ICD-10-CM | POA: Diagnosis not present

## 2017-06-06 HISTORY — DX: Unspecified injury of head, initial encounter: S09.90XA

## 2017-06-06 MED ORDER — CYCLOBENZAPRINE HCL 5 MG PO TABS: 2.5000 mg | ORAL_TABLET | Freq: Every evening | ORAL | 0 refills | Status: DC | PRN

## 2017-06-06 NOTE — Progress Notes (Signed)
HPI:                                                                [name] is a 61 y.o. female who presents to [place]: [facility] today for acute head injury  This pleasant 61 yo F with PMH of HTN, IBS, asthma, anxiety presents today for head pain after striking her head on a steel beam while exiting her vehicle. Incident occurred approximately 90 minutes ago. Endorses soreness in right frontal/parietal area and bilateral neck pain. Denies amnesia, confusion or LOC. She is not on any blood thinners. Denies vision change, headache, lightheadedness, paresthesias, focal weakness, or gait disturbance.   Past Medical History:  Diagnosis Date  . Anxiety   . Asthma   . Chest pain, atypical   . Heart palpitations    hx of MVP  . IBS (irritable bowel syndrome)    Past Surgical History:  Procedure Laterality Date  . eAB     x2    Social History   Tobacco Use  . Smoking status: Never Smoker  . Smokeless tobacco: Never Used  . Tobacco comment: nonsmoker  Substance Use Topics  . Alcohol use: No   family history includes Heart attack in her father; Lung cancer in her mother.  ROS: negative except as noted in the HPI  Medications: Current Outpatient Medications  Medication Sig Dispense Refill  . albuterol (PROAIR HFA) 108 (90 Base) MCG/ACT inhaler Inhale 2 puffs into the lungs every 6 (six) hours as needed for wheezing. 1 Inhaler 6  . AMBULATORY NON FORMULARY MEDICATION Freestyle promise meter Use daily as directed 1 each 0  . AMBULATORY NON FORMULARY MEDICATION Freestyle test strips To use with freestyle promise meter  Test blood sugar twice a day 100 each 11  . aspirin 81 MG tablet Take 1 tablet (81 mg total) by mouth daily. 30 tablet   . B Complex-C (B-COMPLEX WITH VITAMIN C) tablet Take 1 tablet by mouth daily.      . budesonide-formoterol (SYMBICORT) 160-4.5 MCG/ACT inhaler Inhale 2 puffs into the lungs 2 (two) times daily. 3  Inhaler 3  . Calcium-Magnesium-Vitamin D (CALCIUM MAGNESIUM PO) Take by mouth.    . citalopram (CELEXA) 10 MG tablet Take 1 tablet (10 mg total) by mouth daily. 90 tablet 0  . fluocinonide ointment (LIDEX) 4.31 % Apply 1 application topically 2 (two) times daily. 30 g 1  . Garlic Oil 5400 MG CAPS 1 capsule 2 (two) times daily.      . hydrOXYzine (ATARAX/VISTARIL) 25 MG tablet Take 1 tablet (25 mg total) by mouth 3 (three) times daily as needed for anxiety. 30 tablet 1  . lisinopril (PRINIVIL,ZESTRIL) 10 MG tablet Take 1 tablet (10 mg total) by mouth daily. 90 tablet 0  . mupirocin ointment (BACTROBAN) 2 % Apply to nose BID for 7 days. 30 g 3  . Omega-3 Fatty Acids (FISH OIL) 1000 MG CAPS Take 1 capsule by mouth daily.      Marland Kitchen triamcinolone cream (KENALOG) 0.1 % Apply 1 application topically 2 (two) times daily. 453.6 g 12  . vitamin C (ASCORBIC ACID) 500 MG tablet Take 500 mg by mouth daily.       No current facility-administered medications for  this visit.    Allergies  Allergen Reactions  . Penicillins Shortness Of Breath    Reaction as a child  . Effexor [Venlafaxine]     "dopey"       Objective:  BP 134/71   Pulse 78   Wt 196 lb 3 oz (89 kg)   SpO2 98%   BMI 33.68 kg/m  Gen: well-groomed, not ill-appearing, no acute distress HEENT: head normocephalic, atraumatic; there is tenderness of the right scalp, no hematoma, no raccoon eyes, conjunctiva and cornea clear, external ear canals normal bilaterally, oropharynx clear, normal dentition; neck supple, full active ROM, tenderness of the trapezial areas bilaterally, no spinous process tenderness Pulm: Normal work of breathing, normal phonatio Neuro: GCS 15, cranial nerves II-XII intact, no nystagmus, normal finger-to-nose, normal heel-to-shin, negative pronator drift, normal coordination, DTR's intact, normal tone, no tremor MSK: strength 5/5 and symmetric in bilateral upper and lower extremities, normal gait and station, negative  Romberg Mental Status: alert and oriented x 3, speech articulate, and thought processes clear and goal-directed   No results found for this or any previous visit (from the past 72 hour(s)). No results found.    Assessment and Plan: 60 y.o. female with   1. Closed head injury, initial encounter - Reassuring neuro exam. GCS 15. No LOC, short-term memory deficit, amnesia, seizure, vomiting, severe headache, intoxication, anticoagulant use or history of coagulopathy, external evidence of injury above the clavicles, neurologic deficit. No indication for head imaging today - Ice affected area x 20 minutes x 3 times daily - Tylenol 650 mg Q8H prn for pain - discussed symptoms of concussion. Recommended cognitive rest. Discussed red flags warranting emergent care  2. Bilateral posterior neck pain - acute cervical strain - cyclobenzaprine (FLEXERIL) 5 MG tablet; Take 0.5-1 tablets (2.5-5 mg total) at bedtime as needed by mouth for muscle spasms.  Dispense: 10 tablet; Refill: 0   Patient education and anticipatory guidance given Patient agrees with treatment plan Follow-up in 1 week with Sports Medicine for concussion as needed if symptoms worsen or fail to improve  Darlyne Russian PA-C

## 2017-06-06 NOTE — Patient Instructions (Signed)
Concussion, Adult A concussion is a brain injury from a direct hit (blow) to the head or body. This blow causes the brain to shake quickly back and forth inside the skull. This can damage brain cells and cause chemical changes in the brain. A concussion may also be known as a mild traumatic brain injury (TBI). Concussions are usually not life-threatening, but the effects of a concussion can be serious. If you have a concussion, you are more likely to experience concussion-like symptoms after a direct blow to the head in the future. What are the causes? This condition is caused by:  A direct blow to the head, such as from running into another player during a game, being hit in a fight, or hitting your head on a hard surface.  A jolt of the head or neck that causes the brain to move back and forth inside the skull, such as in a car crash.  What are the signs or symptoms? The signs of a concussion can be hard to notice. Early on, they may be missed by you, family members, and health care providers. You may look fine but act or feel differently. Symptoms are usually temporary, but they may last for days, weeks, or even longer. Some symptoms may appear right away but other symptoms may not show up for hours or days. Every head injury is different. Symptoms may include:  Headaches. This can include a feeling of pressure in the head.  Memory problems.  Trouble concentrating, organizing, or making decisions.  Slowness in thinking, acting or reacting, speaking, or reading.  Confusion.  Fatigue.  Changes in eating or sleeping patterns.  Problems with coordination or balance.  Nausea or vomiting.  Numbness or tingling.  Sensitivity to light or noise.  Vision or hearing problems.  Reduced sense of smell.  Irritability or mood changes.  Dizziness.  Lack of motivation.  Seeing or hearing things that other people do not see or hear (hallucinations).  How is this diagnosed? This  condition is diagnosed based on:  Your symptoms.  A description of your injury.  You may also have tests, including:  Imaging tests, such as a CT scan or MRI. These are done to look for signs of brain injury.  Neuropsychological tests. These measure your thinking, understanding, learning, and remembering abilities.  How is this treated? This condition is treated with physical and mental rest and careful observation, usually at home. If the concussion is severe, you may need to stay home from work for a while. You may be referred to a concussion clinic or to other health care providers for management. It is important that you tell your health care provider if:  You are taking any medicines, including prescription medicines, over-the-counter medicines, and natural remedies. Some medicines, such as blood thinners (anticoagulants) and aspirin, may increase the chance of complications, such as bleeding.  You are taking or have taken alcohol or illegal drugs. Alcohol and certain other drugs may slow your recovery and can put you at risk of further injury.  How fast you will recover from a concussion depends on many factors, such as how severe your concussion is, what part of your brain was injured, how old you are, and how healthy you were before the concussion. Recovery can take time. It is important to wait to return to activity until a health care provider says it is safe to do that and your symptoms are completely gone. Follow these instructions at home: Activity  Limit activities that   require a lot of thought or concentration. These may include: ? Doing homework or job-related work. ? Watching TV. ? Working on the computer. ? Playing memory games and puzzles.  Rest. Rest helps the brain to heal. Make sure you: ? Get plenty of sleep at night. Avoid staying up late at night. ? Keep the same bedtime hours on weekends and weekdays. ? Rest during the day. Take naps or rest breaks when you  feel tired.  Having another concussion before the first one has healed can be dangerous. Do not do high-risk activities that could cause a second concussion, such as riding a bicycle or playing sports.  Ask your health care provider when you can return to your normal activities, such as school, work, athletics, driving, riding a bicycle, or using heavy machinery. Your ability to react may be slower after a brain injury. Never do these activities if you are dizzy. Your health care provider will likely give you a plan for gradually returning to activities. General instructions  Take over-the-counter and prescription medicines only as told by your health care provider.  Do not drink alcohol until your health care provider says you can.  If it is harder than usual to remember things, write them down.  If you are easily distracted, try to do one thing at a time. For example, do not try to watch TV while fixing dinner.  Talk with family members or close friends when making important decisions.  Watch your symptoms and tell others to do the same. Complications sometimes occur after a concussion. Older adults with a brain injury may have a higher risk of serious complications, such as a blood clot in the brain.  Tell your teachers, school nurse, school counselor, coach, athletic trainer, or work manager about your injury, symptoms, and restrictions. Tell them about what you can or cannot do. They should watch for: ? Increased problems with attention or concentration. ? Increased difficulty remembering or learning new information. ? Increased time needed to complete tasks or assignments. ? Increased irritability or decreased ability to cope with stress. ? Increased symptoms.  Keep all follow-up visits as told by your health care provider. This is important. How is this prevented? It is very important to avoid another brain injury, especially as you recover. In rare cases, another injury can lead  to permanent brain damage, brain swelling, or death. The risk of this is greatest during the first 7-10 days after a head injury. Avoid injuries by:  Wearing a seat belt when riding in a car.  Wearing a helmet when biking, skiing, skateboarding, skating, or doing similar activities.  Avoiding activities that could lead to a second concussion, such as contact or recreational sports, until your health care provider says it is okay.  Taking safety measures in your home, such as: ? Removing clutter and tripping hazards from floors and stairways. ? Using grab bars in bathrooms and handrails by stairs. ? Placing non-slip mats on floors and in bathtubs. ? Improving lighting in dim areas.  Contact a health care provider if:  Your symptoms get worse.  You have new symptoms.  You continue to have symptoms for more than 2 weeks. Get help right away if:  You have severe or worsening headaches.  You have weakness or numbness in any part of your body.  Your coordination gets worse.  You vomit repeatedly.  You are sleepier.  The pupil of one eye is larger than the other.  You have convulsions or a   seizure.  Your speech is slurred.  Your fatigue, confusion, or irritability gets worse.  You cannot recognize people or places.  You have neck pain.  It is difficult to wake you up.  You have unusual behavior changes.  You lose consciousness. Summary  A concussion is a brain injury from a direct hit (blow) to the head or body.  A concussion may also be called a mild traumatic brain injury (TBI).  You may have imaging tests and neuropsychological tests to diagnose a concussion.  This condition is treated with physical and mental rest and careful observation.  Ask your health care provider when you can return to your normal activities, such as school, work, athletics, driving, riding a bicycle, or using heavy machinery. Follow safety instructions as told by your health care  provider. This information is not intended to replace advice given to you by your health care provider. Make sure you discuss any questions you have with your health care provider. Document Released: 10/07/2003 Document Revised: 06/27/2016 Document Reviewed: 06/27/2016 Elsevier Interactive Patient Education  2017 [employer]  

## 2017-06-07 ENCOUNTER — Encounter: Payer: Self-pay | Admitting: Physician Assistant

## 2017-06-07 DIAGNOSIS — M542 Cervicalgia: Secondary | ICD-10-CM | POA: Insufficient documentation

## 2017-06-08 ENCOUNTER — Encounter: Payer: Self-pay | Admitting: Family Medicine

## 2017-06-13 ENCOUNTER — Ambulatory Visit: Payer: BLUE CROSS/BLUE SHIELD

## 2017-06-27 ENCOUNTER — Ambulatory Visit: Payer: BLUE CROSS/BLUE SHIELD | Admitting: Family Medicine

## 2017-06-27 ENCOUNTER — Encounter: Payer: Self-pay | Admitting: Family Medicine

## 2017-06-27 VITALS — BP 148/80 | HR 86 | Ht 64.0 in | Wt 192.0 lb

## 2017-06-27 DIAGNOSIS — S060X0A Concussion without loss of consciousness, initial encounter: Secondary | ICD-10-CM | POA: Diagnosis not present

## 2017-06-27 DIAGNOSIS — K2900 Acute gastritis without bleeding: Secondary | ICD-10-CM

## 2017-06-27 DIAGNOSIS — L2082 Flexural eczema: Secondary | ICD-10-CM | POA: Diagnosis not present

## 2017-06-27 DIAGNOSIS — K2970 Gastritis, unspecified, without bleeding: Secondary | ICD-10-CM | POA: Insufficient documentation

## 2017-06-27 MED ORDER — OMEPRAZOLE 40 MG PO CPDR: 40.0000 mg | DELAYED_RELEASE_CAPSULE | Freq: Every day | ORAL | 3 refills | Status: DC

## 2017-06-27 MED ORDER — SUCRALFATE 1 G PO TABS: 1.0000 g | ORAL_TABLET | Freq: Four times a day (QID) | ORAL | 0 refills | Status: DC

## 2017-06-27 NOTE — Patient Instructions (Signed)
Thank you for coming in today. STOP aspirin Take omeprazole daily Take carafate 4x daily with meals for 1 week.  Use the gel on the rash.  The concussion symptoms will continue to improve.   Let me know if your stress worsens.    Gastritis, Adult Gastritis is inflammation of the stomach. There are two kinds of gastritis:  Acute gastritis. This kind develops suddenly.  Chronic gastritis. This kind lasts for a long time.  Gastritis happens when the lining of the stomach becomes weak or gets damaged. Without treatment, gastritis can lead to stomach bleeding and ulcers. What are the causes? This condition may be caused by:  An infection.  Drinking too much alcohol.  Certain medicines.  Having too much acid in the stomach.  A disease of the intestines or stomach.  Stress.  What are the signs or symptoms? Symptoms of this condition include:  Pain or a burning in the upper abdomen.  Nausea.  Vomiting.  An uncomfortable feeling of fullness after eating.  In some cases, there are no symptoms. How is this diagnosed? This condition may be diagnosed with:  A description of your symptoms.  A physical exam.  Tests. These can include: ? Blood tests. ? Stool tests. ? A test in which a thin, flexible instrument with a light and camera on the end is passed down the esophagus and into the stomach (upper endoscopy). ? A test in which a sample of tissue is taken for testing (biopsy).  How is this treated? This condition may be treated with medicines. If the condition is caused by a bacterial infection, you may be given antibiotic medicines. If it is caused by too much acid in the stomach, you may get medicines called H2 blockers, proton pump inhibitors, or antacids. Treatment may also involve stopping the use of certain medicines, such as aspirin, ibuprofen, or other nonsteroidal anti-inflammatory drugs (NSAIDs). Follow these instructions at home:  Take over-the-counter and  prescription medicines only as told by your health care provider.  If you were prescribed an antibiotic, take it as told by your health care provider. Do not stop taking the antibiotic even if you start to feel better.  Drink enough fluid to keep your urine clear or pale yellow.  Eat small, frequent meals instead of large meals. Contact a health care provider if:  Your symptoms get worse.  Your symptoms return after treatment. Get help right away if:  You vomit blood or material that looks like coffee grounds.  You have black or dark red stools.  You are unable to keep fluids down.  Your abdominal pain gets worse.  You have a fever.  You do not feel better after 1 week. This information is not intended to replace advice given to you by your health care provider. Make sure you discuss any questions you have with your health care provider. Document Released: 07/11/2001 Document Revised: 03/15/2016 Document Reviewed: 04/10/2015 Elsevier Interactive Patient Education  [employer].

## 2017-06-27 NOTE — Progress Notes (Signed)
[name] is a 61 y.o. female who presents to [facility] today for stomach irritation, follow-up concussion, eczema.  Stomach irritation: [name] notes that she developed some mild intermittent abdominal pain associated with dyspepsia and nausea over the last week or so.  She relates this to a can of tuna fish that she ate.  She does note that her diet has changed over the Thanksgiving holiday as well.  She also notes increased stress due to family dynamics during the Thanksgiving holiday.  She denies any vomiting or diarrhea or blood in the stool.  She also notes that she takes aspirin every day and thinks this may be causing some stomach irritation.  She is feeling a bit better today but does note she still has some dyspepsia and nausea.  Concussion: [name] hit her head on an iron bar getting out of her car on November 7.  She was seen in this clinic by my partner and diagnosed with concussion.  She notes the headache and fogginess are significantly improved although not fully resolved.  She is feeling a lot better.    Eczema: [name] notes continued eczema.  She uses steroid creams intermittently.  She notes overall that are better but the eczema at her left elbow is a bit flared up recently.  She notes itching.    Past Medical History:  Diagnosis Date  . Anxiety   . Asthma   . Chest pain, atypical   . Closed head injury 06/06/2017  . Heart palpitations    hx of MVP  . IBS (irritable bowel syndrome)    Past Surgical History:  Procedure Laterality Date  . eAB     x2    Social History   Tobacco Use  . Smoking status: Never Smoker  . Smokeless tobacco: Never Used  . Tobacco comment: nonsmoker  Substance Use Topics  . Alcohol use: No   family history includes Heart attack in her father; Lung cancer in her mother.  ROS as above:  Medications: Current  Outpatient Medications  Medication Sig Dispense Refill  . albuterol (PROAIR HFA) 108 (90 Base) MCG/ACT inhaler Inhale 2 puffs into the lungs every 6 (six) hours as needed for wheezing. 1 Inhaler 6  . AMBULATORY NON FORMULARY MEDICATION Freestyle promise meter Use daily as directed 1 each 0  . AMBULATORY NON FORMULARY MEDICATION Freestyle test strips To use with freestyle promise meter  Test blood sugar twice a day 100 each 11  . aspirin 81 MG tablet Take 1 tablet (81 mg total) by mouth daily. 30 tablet   . B Complex-C (B-COMPLEX WITH VITAMIN C) tablet Take 1 tablet by mouth daily.      . budesonide-formoterol (SYMBICORT) 160-4.5 MCG/ACT inhaler Inhale 2 puffs into the lungs 2 (two) times daily. 3 Inhaler 3  . Calcium-Magnesium-Vitamin D (CALCIUM MAGNESIUM PO) Take by mouth.    . citalopram (CELEXA) 10 MG tablet Take 1 tablet (10 mg total) by mouth daily. 90 tablet 0  . cyclobenzaprine (FLEXERIL) 5 MG tablet Take 0.5-1 tablets (2.5-5 mg total) at bedtime as needed by mouth for muscle spasms. 10 tablet 0  . fluocinonide ointment (LIDEX) 7.03 % Apply 1 application topically 2 (two) times daily. 30 g 1  . Garlic Oil 5009 MG CAPS 1 capsule 2 (two) times daily.      . hydrOXYzine (ATARAX/VISTARIL) 25 MG tablet Take 1 tablet (25 mg total) by mouth 3 (three) times daily  as needed for anxiety. 30 tablet 1  . lisinopril (PRINIVIL,ZESTRIL) 10 MG tablet Take 1 tablet (10 mg total) by mouth daily. 90 tablet 0  . mupirocin ointment (BACTROBAN) 2 % Apply to nose BID for 7 days. 30 g 3  . Omega-3 Fatty Acids (FISH OIL) 1000 MG CAPS Take 1 capsule by mouth daily.      Marland Kitchen triamcinolone cream (KENALOG) 0.1 % Apply 1 application topically 2 (two) times daily. 453.6 g 12  . vitamin C (ASCORBIC ACID) 500 MG tablet Take 500 mg by mouth daily.      Marland Kitchen omeprazole (PRILOSEC) 40 MG capsule Take 1 capsule (40 mg total) by mouth daily. 30 capsule 3  . sucralfate (CARAFATE) 1 g tablet Take 1 tablet (1 g total) by mouth 4  (four) times daily. 120 tablet 0   No current facility-administered medications for this visit.    Allergies  Allergen Reactions  . Penicillins Shortness Of Breath    Reaction as a child  . Effexor [Venlafaxine]     "dopey"    Health Maintenance Health Maintenance  Topic Date Due  . INFLUENZA VACCINE  11/20/2017 (Originally 02/28/2017)  . Fecal DNA (Cologuard)  12/27/2017 (Originally 11/22/2004)  . TETANUS/TDAP  06/27/2018 (Originally 11/22/1973)  . MAMMOGRAM  02/10/2018  . PAP SMEAR  03/28/2020  . Hepatitis C Screening  Completed  . HIV Screening  Completed     Exam:  BP (!) 148/80   Pulse 86   Ht 5\' 4"  (1.626 m)   Wt 192 lb (87.1 kg)   BMI 32.96 kg/m  Gen: Well NAD HEENT: EOMI,  MMM Lungs: Normal work of breathing. CTABL Heart: RRR no MRG Abd: NABS, Soft. Nondistended, Nontender Exts: Brisk capillary refill, warm and well perfused.  Neuro alert and oriented normal speech coordination speech and gait. Skin: Erythematous maculopapular rash at flexor elbow left with some excoriations present.   No results found for this or any previous visit (from the past 72 hour(s)). No results found.    Assessment and Plan: 61 y.o. female with  Dyspepsia is potentially gastritis possibly due to aspirin.  She may be also dealing with a bit of an IBS flare.  Discussed options.  Plan to try a trial off of aspirin.  If not improving will use omeprazole and Carafate.  Recheck if not improving.  Patient is resistant to taking more medicines if possible.  Concussion: Significantly improving.  Plan for watchful waiting.  Eczema: Recommend using steroid creams.  Patient has expressed reservations about this treatment in the past.  I think it is reasonable to continue the Lidex occasionally with good moisturizers.    No orders of the defined types were placed in this encounter.  Meds ordered this encounter  Medications  . omeprazole (PRILOSEC) 40 MG capsule    Sig: Take 1 capsule (40  mg total) by mouth daily.    Dispense:  30 capsule    Refill:  3  . sucralfate (CARAFATE) 1 g tablet    Sig: Take 1 tablet (1 g total) by mouth 4 (four) times daily.    Dispense:  120 tablet    Refill:  0     Discussed warning signs or symptoms. Please see discharge instructions. Patient expresses understanding.

## 2017-07-11 ENCOUNTER — Encounter: Payer: Self-pay | Admitting: Family Medicine

## 2017-07-19 ENCOUNTER — Encounter: Payer: Self-pay | Admitting: Family Medicine

## 2017-07-19 ENCOUNTER — Ambulatory Visit (INDEPENDENT_AMBULATORY_CARE_PROVIDER_SITE_OTHER): Payer: BLUE CROSS/BLUE SHIELD | Admitting: Family Medicine

## 2017-07-19 VITALS — BP 146/76 | HR 76 | Ht 64.0 in | Wt 197.0 lb

## 2017-07-19 DIAGNOSIS — L219 Seborrheic dermatitis, unspecified: Secondary | ICD-10-CM | POA: Diagnosis not present

## 2017-07-19 DIAGNOSIS — H1013 Acute atopic conjunctivitis, bilateral: Secondary | ICD-10-CM | POA: Diagnosis not present

## 2017-07-19 DIAGNOSIS — L2082 Flexural eczema: Secondary | ICD-10-CM | POA: Diagnosis not present

## 2017-07-19 DIAGNOSIS — H1010 Acute atopic conjunctivitis, unspecified eye: Secondary | ICD-10-CM | POA: Insufficient documentation

## 2017-07-19 DIAGNOSIS — L439 Lichen planus, unspecified: Secondary | ICD-10-CM | POA: Diagnosis not present

## 2017-07-19 MED ORDER — KETOCONAZOLE 2 % EX CREA: 1.0000 "application " | TOPICAL_CREAM | Freq: Two times a day (BID) | CUTANEOUS | 3 refills | Status: DC

## 2017-07-19 NOTE — Patient Instructions (Addendum)
Thank you for coming in today. Use over-the-counter Zaditor eyedrops (Ketotifen) Use Systane artificial tears as needed. Apply ketoconazole cream to the eye lids 2x daily.  Apply  Hydrocortisone cream twice daily to the eye lids until they calm down.   Be sure to use the high potency fluocinide ointment 2-3x daily on the hands.   We will continue to follow.   Seborrheic Dermatitis, Adult Seborrheic dermatitis is a skin disease that causes red, scaly patches. It usually occurs on the scalp, and it is often called dandruff. The patches may appear on other parts of the body. Skin patches tend to appear where there are many oil glands in the skin. Areas of the body that are commonly affected include:  Scalp.  Skin folds of the body.  Ears.  Eyebrows.  Neck.  Face.  Armpits.  The bearded area of men's faces.  The condition may come and go for no known reason, and it is often long-lasting (chronic). What are the causes? The cause of this condition is not known. What increases the risk? This condition is more likely to develop in people who:  Have certain conditions, such as: ? HIV (human immunodeficiency virus). ? AIDS (acquired immunodeficiency syndrome). ? Parkinson disease. ? Mood disorders, such as depression.  Are 26-28 years old.  What are the signs or symptoms? Symptoms of this condition include:  Thick scales on the scalp.  Redness on the face or in the armpits.  Skin that is flaky. The flakes may be white or yellow.  Skin that seems oily or dry but is not helped with moisturizers.  Itching or burning in the affected areas.  How is this diagnosed? This condition is diagnosed with a medical history and physical exam. A sample of your skin may be tested (skin biopsy). You may need to see a skin specialist (dermatologist). How is this treated? There is no cure for this condition, but treatment can help to manage the symptoms. You may get treatment to remove  scales, lower the risk of skin infection, and reduce swelling or itching. Treatment may include:  Creams that reduce swelling and irritation (steroids).  Creams that reduce skin yeast.  Medicated shampoo, soaps, moisturizing creams, or ointments.  Medicated moisturizing creams or ointments.  Follow these instructions at home:  Apply over-the-counter and prescription medicines only as told by your health care provider.  Use any medicated shampoo, soaps, skin creams, or ointments only as told by your health care provider.  Keep all follow-up visits as told by your health care provider. This is important. Contact a health care provider if:  Your symptoms do not improve with treatment.  Your symptoms get worse.  You have new symptoms. This information is not intended to replace advice given to you by your health care provider. Make sure you discuss any questions you have with your health care provider. Document Released: 07/17/2005 Document Revised: 02/04/2016 Document Reviewed: 11/04/2015 Elsevier Interactive Patient Education  [employer].

## 2017-07-20 ENCOUNTER — Ambulatory Visit: Payer: BLUE CROSS/BLUE SHIELD | Admitting: Family Medicine

## 2017-07-20 NOTE — Progress Notes (Signed)
[name] is a 61 y.o. female who presents to [facility]: [place] today for eye irritation anterior production as well as irritation around the lower eyelids bilaterally.  The symptoms have been ongoing for months but have worsened recently.  She notes significantly itchy watery eyes.  She notes that she is been rubbing her eyes a lot and this seems to make it worse.  She has been using some over-the-counter Naphcon A eye drops which have not helped.  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  She has been using triamcinolone cream on it but not the high potency Lidex ointment..  Past Medical History:  Diagnosis Date  . Anxiety   . Asthma   . Chest pain, atypical   . Closed head injury 06/06/2017  . Heart palpitations    hx of MVP  . IBS (irritable bowel syndrome)    Past Surgical History:  Procedure Laterality Date  . eAB     x2    Social History   Tobacco Use  . Smoking status: Never Smoker  . Smokeless tobacco: Never Used  . Tobacco comment: nonsmoker  Substance Use Topics  . Alcohol use: No   family history includes Heart attack in her father; Lung cancer in her mother.  ROS as above:  Medications: Current Outpatient Medications  Medication Sig Dispense Refill  . albuterol (PROAIR HFA) 108 (90 Base) MCG/ACT inhaler Inhale 2 puffs into the lungs every 6 (six) hours as needed for wheezing. 1 Inhaler 6  . AMBULATORY NON FORMULARY MEDICATION Freestyle promise meter Use daily as directed 1 each 0  . AMBULATORY NON FORMULARY MEDICATION Freestyle test strips To use with freestyle promise meter  Test blood sugar twice a day 100 each 11  . aspirin 81 MG tablet Take 1 tablet (81 mg total) by mouth daily. 30 tablet   . B Complex-C (B-COMPLEX WITH VITAMIN C) tablet Take 1 tablet by mouth daily.      .  budesonide-formoterol (SYMBICORT) 160-4.5 MCG/ACT inhaler Inhale 2 puffs into the lungs 2 (two) times daily. 3 Inhaler 3  . Calcium-Magnesium-Vitamin D (CALCIUM MAGNESIUM PO) Take by mouth.    . citalopram (CELEXA) 10 MG tablet Take 1 tablet (10 mg total) by mouth daily. 90 tablet 0  . cyclobenzaprine (FLEXERIL) 5 MG tablet Take 0.5-1 tablets (2.5-5 mg total) at bedtime as needed by mouth for muscle spasms. 10 tablet 0  . fluocinonide ointment (LIDEX) 4.65 % Apply 1 application topically 2 (two) times daily. 30 g 1  . Garlic Oil 0354 MG CAPS 1 capsule 2 (two) times daily.      . hydrOXYzine (ATARAX/VISTARIL) 25 MG tablet Take 1 tablet (25 mg total) by mouth 3 (three) times daily as needed for anxiety. 30 tablet 1  . lisinopril (PRINIVIL,ZESTRIL) 10 MG tablet Take 1 tablet (10 mg total) by mouth daily. 90 tablet 0  . mupirocin ointment (BACTROBAN) 2 % Apply to nose BID for 7 days. 30 g 3  . Omega-3 Fatty Acids (FISH OIL) 1000 MG CAPS Take 1 capsule by mouth daily.      [name] [name] (PRILOSEC) 40 MG capsule Take 1 capsule (40 mg total) by mouth daily. 30 capsule 3  . sucralfate (CARAFATE) 1 g tablet Take 1 tablet (1 g total) by mouth 4 (four) times daily. 120 tablet 0  . triamcinolone cream (KENALOG) 0.1 % Apply 1 application topically 2 (two)  times daily. 453.6 g 12  . vitamin C (ASCORBIC ACID) 500 MG tablet Take 500 mg by mouth daily.      [name] Kristin ketoconazole (NIZORAL) 2 % cream Apply 1 application topically 2 (two) times daily. To affected areas. 60 g 3   No current facility-administered medications for this visit.    Allergies  Allergen Reactions  . Penicillins Shortness Of Breath    Reaction as a child  . Effexor [Venlafaxine]     "dopey"    Health Maintenance Health Maintenance  Topic Date Due  . INFLUENZA VACCINE  11/20/2017 (Originally 02/28/2017)  . Fecal DNA (Cologuard)  12/27/2017 (Originally 11/22/2004)  . TETANUS/TDAP  06/27/2018 (Originally 11/22/1973)  . MAMMOGRAM  02/10/2018  .  PAP SMEAR  03/28/2020  . Hepatitis C Screening  Completed  . HIV Screening  Completed     Exam:  BP (!) 146/76   Pulse 76   Ht 5\' 4"  (1.626 m)   Wt 197 lb (89.4 kg)   BMI 33.81 kg/m  Gen: Well NAD HEENT: EOMI,  MMM no significant conjunctival injection.  The inferior eyelids however are very puffy and mildly erythematous with some scale. Lungs: Normal work of breathing. CTABL Heart: RRR no MRG Abd: NABS, Soft. Nondistended, Nontender Exts: Brisk capillary refill, warm and well perfused.  Skin: Bilateral forearms with erythematous maculopapular lesions with some excoriation consistent with lichen planus  No results found for this or any previous visit (from the past 72 hour(s)). No results found.    Assessment and Plan: 61 y.o. female with  I believe [name] probably has dry eyes.  She may have some allergic conjunctivitis.  I think it is reasonable to treat with over-the-counter antihistamine eyedrops (Zaditor) as well as artificial tears.  Do not think that the Naphcon A drops are helping.   Additionally I think [name] has seborrheic dermatitis of her eyelids.  We will treat this with small amounts of hydrocortisone cream as well as ketoconazole cream.  Lastly the lichen planus is a little bit worse.  I think she should start using high potency Lidex ointment.  We spent some time discussing the differences between all of her various steroid creams and ointments.  Recheck in a month or so.   No orders of the defined types were placed in this encounter.  Meds ordered this encounter  Medications  . ketoconazole (NIZORAL) 2 % cream    Sig: Apply 1 application topically 2 (two) times daily. To affected areas.    Dispense:  60 g    Refill:  3     Discussed warning signs or symptoms. Please see discharge instructions. Patient expresses understanding.  I spent 25 minutes with this patient, greater than 50% was face-to-face time counseling regarding ddx and treatment plan.

## 2017-07-21 ENCOUNTER — Encounter: Payer: Self-pay | Admitting: Family Medicine

## 2017-08-09 ENCOUNTER — Encounter: Payer: Self-pay | Admitting: Family Medicine

## 2017-08-23 ENCOUNTER — Other Ambulatory Visit: Payer: Self-pay | Admitting: Family Medicine

## 2017-08-28 ENCOUNTER — Encounter: Payer: Self-pay | Admitting: Family Medicine

## 2017-08-28 MED ORDER — CITALOPRAM HYDROBROMIDE 10 MG PO TABS: 10.0000 mg | ORAL_TABLET | Freq: Every day | ORAL | 0 refills | Status: DC

## 2017-09-06 DIAGNOSIS — L249 Irritant contact dermatitis, unspecified cause: Secondary | ICD-10-CM | POA: Diagnosis not present

## 2017-09-12 ENCOUNTER — Ambulatory Visit: Payer: BLUE CROSS/BLUE SHIELD | Admitting: Family Medicine

## 2017-09-14 ENCOUNTER — Ambulatory Visit: Payer: BLUE CROSS/BLUE SHIELD | Admitting: Family Medicine

## 2017-09-18 ENCOUNTER — Encounter: Payer: Self-pay | Admitting: Family Medicine

## 2017-09-18 ENCOUNTER — Ambulatory Visit (INDEPENDENT_AMBULATORY_CARE_PROVIDER_SITE_OTHER): Payer: BLUE CROSS/BLUE SHIELD | Admitting: Family Medicine

## 2017-09-18 VITALS — BP 150/78 | HR 83 | Ht 65.0 in | Wt 199.0 lb

## 2017-09-18 DIAGNOSIS — L2082 Flexural eczema: Secondary | ICD-10-CM

## 2017-09-18 DIAGNOSIS — R7303 Prediabetes: Secondary | ICD-10-CM | POA: Diagnosis not present

## 2017-09-18 DIAGNOSIS — E782 Mixed hyperlipidemia: Secondary | ICD-10-CM | POA: Diagnosis not present

## 2017-09-18 DIAGNOSIS — I10 Essential (primary) hypertension: Secondary | ICD-10-CM | POA: Diagnosis not present

## 2017-09-18 LAB — POCT GLYCOSYLATED HEMOGLOBIN (HGB A1C): HEMOGLOBIN A1C: 6.1

## 2017-09-18 MED ORDER — TRIAMCINOLONE ACETONIDE 0.1 % EX CREA: 1.0000 "application " | TOPICAL_CREAM | Freq: Two times a day (BID) | CUTANEOUS | 12 refills | Status: DC

## 2017-09-18 MED ORDER — TERBINAFINE HCL 250 MG PO TABS: 250.0000 mg | ORAL_TABLET | Freq: Every day | ORAL | 0 refills | Status: AC

## 2017-09-18 MED ORDER — FLUOCINONIDE 0.05 % EX OINT: 1.0000 "application " | TOPICAL_OINTMENT | Freq: Two times a day (BID) | CUTANEOUS | 1 refills | Status: DC

## 2017-09-18 NOTE — Progress Notes (Signed)
ZAEDA MCFERRAN is a 62 y.o. female who presents to [facility]: [facility] today for follow up rash, HTN, and asthma.   Rash: Laverna has a history of atopic dermatitis.  She has been using her lotions and creams somewhat intermittently.  Not sure exactly what she is taking but she certainly notes that she is using Eucerin cream regularly.  She notes that her rash has worsened.  She attributes her rash to her apartment which is old and needs refurbishment.  She notes that she is run out of her loratadine which has helped in the past.  Hypertension: Shereka takes lisinopril 10 mg daily.  Blood pressures typically in the 130s 140s.  She denies chest pain palpitations shortness of breath lightheadedness or dizziness.  Josphine notes her asthma is quite well controlled with Symbicort.  She takes this twice daily regularly.  She feels well and denies any wheezing.   Past Medical History:  Diagnosis Date  . Anxiety   . Asthma   . Chest pain, atypical   . Closed head injury 06/06/2017  . Heart palpitations    hx of MVP  . IBS (irritable bowel syndrome)    Past Surgical History:  Procedure Laterality Date  . eAB     x2    Social History   Tobacco Use  . Smoking status: Never Smoker  . Smokeless tobacco: Never Used  . Tobacco comment: nonsmoker  Substance Use Topics  . Alcohol use: No   family history includes Heart attack in her father; Lung cancer in her mother.  ROS as above:  Medications: Current Outpatient Medications  Medication Sig Dispense Refill  . albuterol (PROAIR HFA) 108 (90 Base) MCG/ACT inhaler Inhale 2 puffs into the lungs every 6 (six) hours as needed for wheezing. 1 Inhaler 6  . AMBULATORY NON FORMULARY MEDICATION Freestyle promise meter Use daily as directed 1 each 0  . AMBULATORY NON FORMULARY MEDICATION Freestyle test strips To use with freestyle promise  meter  Test blood sugar twice a day 100 each 11  . aspirin 81 MG tablet Take 1 tablet (81 mg total) by mouth daily. 30 tablet   . B Complex-C (B-COMPLEX WITH VITAMIN C) tablet Take 1 tablet by mouth daily.      . budesonide-formoterol (SYMBICORT) 160-4.5 MCG/ACT inhaler Inhale 2 puffs into the lungs 2 (two) times daily. 3 Inhaler 3  . Calcium-Magnesium-Vitamin D (CALCIUM MAGNESIUM PO) Take by mouth.    . citalopram (CELEXA) 10 MG tablet Take 1 tablet (10 mg total) by mouth daily. 90 tablet 0  . fluocinonide ointment (LIDEX) 6.14 % Apply 1 application topically 2 (two) times daily. 30 g 1  . Garlic Oil 4315 MG CAPS 1 capsule 2 (two) times daily.      . hydrOXYzine (ATARAX/VISTARIL) 25 MG tablet Take 1 tablet (25 mg total) by mouth 3 (three) times daily as needed for anxiety. 30 tablet 1  . ketoconazole (NIZORAL) 2 % cream Apply 1 application topically 2 (two) times daily. To affected areas. 60 g 3  . lisinopril (PRINIVIL,ZESTRIL) 10 MG tablet TAKE 1 TABLET BY MOUTH EVERY DAY 90 tablet 0  . mupirocin ointment (BACTROBAN) 2 % Apply to nose BID for 7 days. 30 g 3  . Omega-3 Fatty Acids (FISH OIL) 1000 MG CAPS Take 1 capsule by mouth daily.      Marland Kitchen triamcinolone cream (KENALOG) 0.1 % Apply 1 application topically 2 (two) times daily. 453.6  g 12  . vitamin C (ASCORBIC ACID) 500 MG tablet Take 500 mg by mouth daily.      Marland Kitchen terbinafine (LAMISIL) 250 MG tablet Take 1 tablet (250 mg total) by mouth daily. 14 tablet 0   No current facility-administered medications for this visit.    Allergies  Allergen Reactions  . Penicillins Shortness Of Breath    Reaction as a child  . Effexor [Venlafaxine]     "dopey"    Health Maintenance Health Maintenance  Topic Date Due  . INFLUENZA VACCINE  11/20/2017 (Originally 02/28/2017)  . Fecal DNA (Cologuard)  12/27/2017 (Originally 11/22/2004)  . TETANUS/TDAP  06/27/2018 (Originally 11/22/1973)  . MAMMOGRAM  02/10/2018  . PAP SMEAR  03/28/2020  . Hepatitis C  Screening  Completed  . HIV Screening  Completed     Exam:  BP (!) 150/78   Pulse 83   Ht 5\' 5"  (1.651 m)   Wt 199 lb (90.3 kg)   BMI 33.12 kg/m  Gen: Well NAD HEENT: EOMI,  MMM Lungs: Normal work of breathing. CTABL Heart: RRR no MRG Abd: NABS, Soft. Nondistended, Nontender Exts: Brisk capillary refill, warm and well perfused.  Skin: Significant erythematous crusty lesions on trunk and extremities consistent with atopic dermatitis.   Results for orders placed or performed in visit on 09/18/17 (from the past 72 hour(s))  POCT HgB A1C     Status: None   Collection Time: 09/18/17  8:06 AM  Result Value Ref Range   Hemoglobin A1C 6.1    No results found.    Assessment and Plan: 62 y.o. female with  Rash very likely atopic dermatitis.  Differential diagnosis is also dermatophyte.  I think the main reason her rash is worse in is because she is not using her steroid creams regularly.  I emphasized the importance of using her medications regularly and the basic fundamental reasons why they are important.  She expresses understanding and agreement.  The differential diagnosis for her rash also includes dermatophyte.  Will treat for potential dermatophyte infection with terbinafine orally for 2 weeks. Recheck in a few months.   HTN: Not well controlled. Increase lisinopril to 20mg  daily. And recheck home BP and send results next week. Will increase pill size to 20mg  if she tolerates it well.  Check fasting labs.   Asthma: Doing well. Continue current medicines.   Prediabetes: Stable. Continue low carb diet.    Orders Placed This Encounter  Procedures  . CBC  . COMPLETE METABOLIC PANEL WITH GFR  . Lipid Panel w/reflex Direct LDL  . POCT HgB A1C   Meds ordered this encounter  Medications  . triamcinolone cream (KENALOG) 0.1 %    Sig: Apply 1 application topically 2 (two) times daily.    Dispense:  453.6 g    Refill:  12  . fluocinonide ointment (LIDEX) 0.05 %    Sig:  Apply 1 application topically 2 (two) times daily.    Dispense:  30 g    Refill:  1  . terbinafine (LAMISIL) 250 MG tablet    Sig: Take 1 tablet (250 mg total) by mouth daily.    Dispense:  14 tablet    Refill:  0     Discussed warning signs or symptoms. Please see discharge instructions. Patient expresses understanding.

## 2017-09-18 NOTE — Patient Instructions (Addendum)
Thank you for coming in today. 1) High Blood pressure: Increase Lisinopril to 20mg  daily. This is 2 pills at one time.  Send me your blood pressure log though my chart. If all is well I will send in 20mg  pills.   2) Rash: Try to take loratidine daily. Use triamcinolone cream (big tub) twice daily like you would use Eucerin.  Use Lidex (flucenidiene) small tube on the hot spots twice daily.   I am sending in terbinafine pill for fungus.   Recheck with me in 3 months.  Return sooner if needed.

## 2017-09-19 ENCOUNTER — Encounter: Payer: Self-pay | Admitting: Family Medicine

## 2017-09-20 ENCOUNTER — Encounter: Payer: Self-pay | Admitting: Family Medicine

## 2017-10-01 ENCOUNTER — Encounter: Payer: Self-pay | Admitting: Family Medicine

## 2017-10-04 ENCOUNTER — Ambulatory Visit: Payer: BLUE CROSS/BLUE SHIELD | Admitting: Family Medicine

## 2017-10-04 ENCOUNTER — Encounter: Payer: Self-pay | Admitting: Family Medicine

## 2017-10-04 VITALS — BP 165/82 | HR 80 | Ht 64.0 in | Wt 196.0 lb

## 2017-10-04 DIAGNOSIS — I10 Essential (primary) hypertension: Secondary | ICD-10-CM

## 2017-10-04 DIAGNOSIS — L2082 Flexural eczema: Secondary | ICD-10-CM | POA: Diagnosis not present

## 2017-10-04 DIAGNOSIS — F411 Generalized anxiety disorder: Secondary | ICD-10-CM

## 2017-10-04 DIAGNOSIS — J45998 Other asthma: Secondary | ICD-10-CM | POA: Diagnosis not present

## 2017-10-04 DIAGNOSIS — F419 Anxiety disorder, unspecified: Secondary | ICD-10-CM | POA: Diagnosis not present

## 2017-10-04 MED ORDER — LISINOPRIL 20 MG PO TABS: 20.0000 mg | ORAL_TABLET | Freq: Every day | ORAL | 1 refills | Status: DC

## 2017-10-04 MED ORDER — CITALOPRAM HYDROBROMIDE 20 MG PO TABS: 20.0000 mg | ORAL_TABLET | Freq: Every day | ORAL | 1 refills | Status: DC

## 2017-10-04 NOTE — Progress Notes (Signed)
[name] is a 62 y.o. female who presents to [facility] today for HTN, Rash, and anxiety.   [name] called EMS last night when she noted significant stress and elevated blood pressure. She attributes her elevated blood pressure to eating salty food, dealing with a lot of stress at home and with her son and then using Symbicort inhaler.  She measured her blood pressure in the 170s.  She feels well now with no chest pain or palpitations shortness of breath lightheadedness or dizziness.  She is very concerned that something serious is going on with her body.  Hypertension: [name] notes that she takes lisinopril 10 mg daily.  She notes this typically results in blood pressures in the 130s with no chest pain or palpitations.  She denies lightheadedness or dizziness.  Anxiety: [name] notes a history of significant baseline anxiety worsening recently due to stress at home.  She typically takes Celexa 10 mg daily.  She will use hydroxyzine at times for anxiety.  Rash: [name] has a history of eczema and reluctantly uses steroid creams listed below to manage it.  She notes that she has been using creams off and on which is helped a bit.   Past Medical History:  Diagnosis Date  . Anxiety   . Asthma   . Chest pain, atypical   . Closed head injury 06/06/2017  . Heart palpitations    hx of MVP  . IBS (irritable bowel syndrome)    Past Surgical History:  Procedure Laterality Date  . eAB     x2    Social History   Tobacco Use  . Smoking status: Never Smoker  . Smokeless tobacco: Never Used  . Tobacco comment: nonsmoker  Substance Use Topics  . Alcohol use: No   family history includes Heart attack in her father; Lung cancer in her mother.  ROS as above:  Medications: Current Outpatient Medications  Medication Sig Dispense Refill  . albuterol (PROAIR HFA) 108 (90 Base)  MCG/ACT inhaler Inhale 2 puffs into the lungs every 6 (six) hours as needed for wheezing. 1 Inhaler 6  . AMBULATORY NON FORMULARY MEDICATION Freestyle promise meter Use daily as directed 1 each 0  . AMBULATORY NON FORMULARY MEDICATION Freestyle test strips To use with freestyle promise meter  Test blood sugar twice a day 100 each 11  . aspirin 81 MG tablet Take 1 tablet (81 mg total) by mouth daily. 30 tablet   . B Complex-C (B-COMPLEX WITH VITAMIN C) tablet Take 1 tablet by mouth daily.      . Calcium-Magnesium-Vitamin D (CALCIUM MAGNESIUM PO) Take by mouth.    . citalopram (CELEXA) 20 MG tablet Take 1 tablet (20 mg total) by mouth daily. 90 tablet 1  . fluocinonide ointment (LIDEX) 6.07 % Apply 1 application topically 2 (two) times daily. 30 g 1  . Garlic Oil 3710 MG CAPS 1 capsule 2 (two) times daily.      . hydrOXYzine (ATARAX/VISTARIL) 25 MG tablet Take 1 tablet (25 mg total) by mouth 3 (three) times daily as needed for anxiety. 30 tablet 1  . ketoconazole (NIZORAL) 2 % cream Apply 1 application topically 2 (two) times daily. To affected areas. 60 g 3  . lisinopril (PRINIVIL,ZESTRIL) 20 MG tablet Take 1 tablet (20 mg total) by mouth daily. 90 tablet 1  . mupirocin ointment (BACTROBAN) 2 % Apply to nose BID for 7 days. 30 g 3  .  Omega-3 Fatty Acids (FISH OIL) 1000 MG CAPS Take 1 capsule by mouth daily.      Marland Kitchen terbinafine (LAMISIL) 250 MG tablet Take 1 tablet (250 mg total) by mouth daily. 14 tablet 0  . triamcinolone cream (KENALOG) 0.1 % Apply 1 application topically 2 (two) times daily. 453.6 g 12  . vitamin C (ASCORBIC ACID) 500 MG tablet Take 500 mg by mouth daily.       No current facility-administered medications for this visit.    Allergies  Allergen Reactions  . Penicillins Shortness Of Breath    Reaction as a child  . Effexor [Venlafaxine]     "dopey"    Health Maintenance Health Maintenance  Topic Date Due  . INFLUENZA VACCINE  11/20/2017 (Originally 02/28/2017)  .  Fecal DNA (Cologuard)  12/27/2017 (Originally 11/22/2004)  . TETANUS/TDAP  06/27/2018 (Originally 11/22/1973)  . MAMMOGRAM  02/10/2018  . PAP SMEAR  03/28/2020  . Hepatitis C Screening  Completed  . HIV Screening  Completed     Exam:  BP (!) 165/82   Pulse 80   Ht 5\' 4"  (1.626 m)   Wt 196 lb (88.9 kg)   BMI 33.64 kg/m   Wt Readings from Last 5 Encounters:  10/04/17 196 lb (88.9 kg)  09/18/17 199 lb (90.3 kg)  07/19/17 197 lb (89.4 kg)  06/27/17 192 lb (87.1 kg)  06/06/17 196 lb 3 oz (89 kg)    Gen: Well NAD HEENT: EOMI,  MMM Lungs: Normal work of breathing. CTABL Heart: RRR no MRG Abd: NABS, Soft. Nondistended, Nontender Exts: Brisk capillary refill, warm and well perfused.  Skin: Erythematous maculopapular scaly circular lesions consistent with atopic dermatitis on arms present Psych: Alert and oriented affect is anxious appearing.  Speech is normal but thought process is a bit tangential.  No SI or HI expressed.  Depression screen [facility] 2/9 10/04/2017 09/18/2017 03/07/2017 03/07/2017 08/29/2016  Decreased Interest 0 1 0 2 2  Down, Depressed, Hopeless 1 2 2 1 2   PHQ - 2 Score 1 3 2 3 4   Altered sleeping 1 1 3 3 1   Tired, decreased energy 1 0 2 1 1   Change in appetite 2 1 2 2 2   Feeling bad or failure about yourself  0 1 1 2  0  Trouble concentrating 0 0 0 0 0  Moving slowly or fidgety/restless 1 0 1 0 2  Suicidal thoughts 0 0 0 0 0  PHQ-9 Score 6 6 11 11 10   Difficult doing work/chores Somewhat difficult Not difficult at all - - -   GAD 7 : Generalized Anxiety Score 10/04/2017 03/07/2017 08/29/2016  Nervous, Anxious, on Edge 3 3 1   Control/stop worrying 3 3 3   Worry too much - different things 3 3 3   Trouble relaxing 2 2 2   Restless 1 2 2   Easily annoyed or irritable 3 2 2   Afraid - awful might happen 3 3 2   Total GAD 7 Score 18 18 15   Anxiety Difficulty Very difficult Very difficult Very difficult       No results found for this or any previous visit (from the past 29  hour(s)). No results found.    Assessment and Plan: 62 y.o. female with  Episode of elevated blood pressure was very likely panic attack.  Plan for watchful waiting and would discuss results below.  Recommend stopping Symbicort for a little while.  If asthma worsens will use steroid-containing inhalers preferentially.  Hypertension: Not well controlled currently and not ideally  controlled typically.  Plan to increase lisinopril to 20 mg and recheck in 1 month.  We will check metabolic panel at that time.  Anxiety: Certainly not well controlled at baseline.  Plan to increase Celexa to 20 mg daily.  Refer to behavioral health for cognitive behavioral therapy and counseling which should be very helpful.  Recheck in 1 month.  Eczema: Stressed the importance of consistent use of steroid creams to get good control.   Orders Placed This Encounter  Procedures  . Ambulatory referral to Behavioral Health    Referral Priority:   Routine    Referral Type:   Psychiatric    Referral Reason:   Specialty Services Required    Requested Specialty:   Behavioral Health    Number of Visits Requested:   1   Meds ordered this encounter  Medications  . lisinopril (PRINIVIL,ZESTRIL) 20 MG tablet    Sig: Take 1 tablet (20 mg total) by mouth daily.    Dispense:  90 tablet    Refill:  1  . citalopram (CELEXA) 20 MG tablet    Sig: Take 1 tablet (20 mg total) by mouth daily.    Dispense:  90 tablet    Refill:  1     Discussed warning signs or symptoms. Please see discharge instructions. Patient expresses understanding.

## 2017-10-04 NOTE — Patient Instructions (Addendum)
Thank you for coming in today. STOP Symbicort.  Continue steroid cream Increase lisinopril to 20 daily.  Increase celexa to 20 daily.  Recheck in 1 month or sooner if needed.  Continue exercise.

## 2017-10-10 DIAGNOSIS — R7303 Prediabetes: Secondary | ICD-10-CM | POA: Diagnosis not present

## 2017-10-10 DIAGNOSIS — E782 Mixed hyperlipidemia: Secondary | ICD-10-CM | POA: Diagnosis not present

## 2017-10-10 DIAGNOSIS — I10 Essential (primary) hypertension: Secondary | ICD-10-CM | POA: Diagnosis not present

## 2017-10-10 LAB — COMPLETE METABOLIC PANEL WITH GFR
AG Ratio: 1.5 (calc) (ref 1.0–2.5)
ALT: 15 U/L (ref 6–29)
AST: 29 U/L (ref 10–35)
Albumin: 4.2 g/dL (ref 3.6–5.1)
Alkaline phosphatase (APISO): 88 U/L (ref 33–130)
BILIRUBIN TOTAL: 0.4 mg/dL (ref 0.2–1.2)
BUN: 21 mg/dL (ref 7–25)
CALCIUM: 9.4 mg/dL (ref 8.6–10.4)
CO2: 28 mmol/L (ref 20–32)
Chloride: 104 mmol/L (ref 98–110)
Creat: 0.78 mg/dL (ref 0.50–0.99)
GFR, EST AFRICAN AMERICAN: 94 mL/min/{1.73_m2} (ref >=60)
GFR, EST NON AFRICAN AMERICAN: 81 mL/min/{1.73_m2} (ref >=60)
GLOBULIN: 2.8 g/dL (ref 1.9–3.7)
Glucose, Bld: 117 mg/dL — ABNORMAL HIGH (ref 65–99)
Potassium: 4.9 mmol/L (ref 3.5–5.3)
Sodium: 139 mmol/L (ref 135–146)
TOTAL PROTEIN: 7 g/dL (ref 6.1–8.1)

## 2017-10-10 LAB — CBC
HEMATOCRIT: 39.6 % (ref 35.0–45.0)
Hemoglobin: 13.5 g/dL (ref 11.7–15.5)
MCH: 28.6 pg (ref 27.0–33.0)
MCHC: 34.1 g/dL (ref 32.0–36.0)
MCV: 83.9 fL (ref 80.0–100.0)
MPV: 9.9 fL (ref 7.5–12.5)
PLATELETS: 290 10*3/uL (ref 140–400)
RBC: 4.72 10*6/uL (ref 3.80–5.10)
RDW: 13 % (ref 11.0–15.0)
WBC: 5 10*3/uL (ref 3.8–10.8)

## 2017-10-10 LAB — LIPID PANEL W/REFLEX DIRECT LDL
CHOL/HDL RATIO: 3.8 (calc) (ref <5.0)
CHOLESTEROL: 199 mg/dL (ref <200)
HDL: 53 mg/dL (ref >50)
LDL CHOLESTEROL (CALC): 120 mg/dL — AB
Non-HDL Cholesterol (Calc): 146 mg/dL (calc) — ABNORMAL HIGH (ref <130)
Triglycerides: 146 mg/dL (ref <150)

## 2017-10-11 ENCOUNTER — Encounter: Payer: Self-pay | Admitting: Family Medicine

## 2017-10-11 ENCOUNTER — Ambulatory Visit: Payer: BLUE CROSS/BLUE SHIELD

## 2017-10-11 DIAGNOSIS — R7301 Impaired fasting glucose: Secondary | ICD-10-CM | POA: Insufficient documentation

## 2017-10-12 ENCOUNTER — Ambulatory Visit: Payer: BLUE CROSS/BLUE SHIELD

## 2017-10-22 ENCOUNTER — Encounter: Payer: Self-pay | Admitting: Family Medicine

## 2017-10-24 ENCOUNTER — Encounter: Payer: Self-pay | Admitting: Family Medicine

## 2017-10-27 ENCOUNTER — Other Ambulatory Visit: Payer: Self-pay | Admitting: Family Medicine

## 2017-10-29 ENCOUNTER — Encounter: Payer: Self-pay | Admitting: Family Medicine

## 2017-10-30 ENCOUNTER — Encounter: Payer: Self-pay | Admitting: Family Medicine

## 2017-10-31 MED ORDER — LISINOPRIL 10 MG PO TABS: 10.0000 mg | ORAL_TABLET | Freq: Two times a day (BID) | ORAL | 1 refills | Status: DC

## 2017-10-31 NOTE — Addendum Note (Signed)
Addended by: Gregor Hams on: 10/31/2017 06:59 AM   Modules accepted: Orders

## 2017-11-07 ENCOUNTER — Ambulatory Visit: Payer: BLUE CROSS/BLUE SHIELD

## 2017-11-14 ENCOUNTER — Encounter: Payer: Self-pay | Admitting: Family Medicine

## 2017-11-15 ENCOUNTER — Ambulatory Visit: Payer: BLUE CROSS/BLUE SHIELD | Admitting: Family Medicine

## 2017-11-20 ENCOUNTER — Ambulatory Visit: Payer: BLUE CROSS/BLUE SHIELD | Admitting: Family Medicine

## 2017-11-21 ENCOUNTER — Ambulatory Visit: Payer: BLUE CROSS/BLUE SHIELD | Admitting: Family Medicine

## 2017-11-27 ENCOUNTER — Encounter: Payer: Self-pay | Admitting: Family Medicine

## 2017-11-27 DIAGNOSIS — Z780 Asymptomatic menopausal state: Secondary | ICD-10-CM

## 2017-11-28 ENCOUNTER — Other Ambulatory Visit: Payer: Self-pay | Admitting: Family Medicine

## 2017-11-30 ENCOUNTER — Ambulatory Visit (INDEPENDENT_AMBULATORY_CARE_PROVIDER_SITE_OTHER): Payer: BLUE CROSS/BLUE SHIELD | Admitting: Family Medicine

## 2017-11-30 ENCOUNTER — Ambulatory Visit (INDEPENDENT_AMBULATORY_CARE_PROVIDER_SITE_OTHER): Payer: BLUE CROSS/BLUE SHIELD

## 2017-11-30 ENCOUNTER — Other Ambulatory Visit: Payer: Self-pay

## 2017-11-30 VITALS — BP 145/68 | HR 68 | Wt 198.0 lb

## 2017-11-30 DIAGNOSIS — I10 Essential (primary) hypertension: Secondary | ICD-10-CM | POA: Diagnosis not present

## 2017-11-30 DIAGNOSIS — L2082 Flexural eczema: Secondary | ICD-10-CM

## 2017-11-30 DIAGNOSIS — M25571 Pain in right ankle and joints of right foot: Secondary | ICD-10-CM | POA: Diagnosis not present

## 2017-11-30 MED ORDER — TACROLIMUS 0.1 % EX OINT: TOPICAL_OINTMENT | Freq: Two times a day (BID) | CUTANEOUS | 12 refills | Status: DC

## 2017-11-30 NOTE — Progress Notes (Signed)
[name] is a 62 y.o. female who presents to [facility] today for right ankle pain and eczema.  Cyriah notes recurrent right ankle pain.  For the last 3 or 4 days she is been having worsening right lateral ankle pain.  She has a history of an ankle injury occurring about 5 or 6 years ago.  She did well until recently.  She notes that the pain started when she was doing some squatting at home.  She notes pain in the right lateral ankle worse with activity better with rest.  She is tried some over-the-counter medications for pain which have helped some.  Additionally Brinda notes continued atopic dermatitis.  She has been using steroid creams listed below which does help but not fully.  She is quite bothered by her symptoms noting itching and irritation.   Past Medical History:  Diagnosis Date  . Anxiety   . Asthma   . Chest pain, atypical   . Closed head injury 06/06/2017  . Heart palpitations    hx of MVP  . IBS (irritable bowel syndrome)    Past Surgical History:  Procedure Laterality Date  . eAB     x2    Social History   Tobacco Use  . Smoking status: Never Smoker  . Smokeless tobacco: Never Used  . Tobacco comment: nonsmoker  Substance Use Topics  . Alcohol use: No   family history includes Heart attack in her father; Lung cancer in her mother.  ROS as above:  Medications: Current Outpatient Medications  Medication Sig Dispense Refill  . albuterol (PROAIR HFA) 108 (90 Base) MCG/ACT inhaler Inhale 2 puffs into the lungs every 6 (six) hours as needed for wheezing. 1 Inhaler 6  . AMBULATORY NON FORMULARY MEDICATION Freestyle promise meter Use daily as directed 1 each 0  . AMBULATORY NON FORMULARY MEDICATION Freestyle test strips To use with freestyle promise meter  Test blood sugar twice a day 100 each 11  . aspirin 81 MG tablet Take 1 tablet (81  mg total) by mouth daily. 30 tablet   . B Complex-C (B-COMPLEX WITH VITAMIN C) tablet Take 1 tablet by mouth daily.      . Calcium-Magnesium-Vitamin D (CALCIUM MAGNESIUM PO) Take by mouth.    . citalopram (CELEXA) 10 MG tablet TAKE 1 TABLET BY MOUTH EVERY DAY 90 tablet 0  . citalopram (CELEXA) 20 MG tablet Take 1 tablet (20 mg total) by mouth daily. 90 tablet 1  . fluocinonide ointment (LIDEX) 0.08 % Apply 1 application topically 2 (two) times daily. 30 g 1  . Garlic Oil 6761 MG CAPS 1 capsule 2 (two) times daily.      . hydrOXYzine (ATARAX/VISTARIL) 25 MG tablet Take 1 tablet (25 mg total) by mouth 3 (three) times daily as needed for anxiety. 30 tablet 1  . ketoconazole (NIZORAL) 2 % cream Apply 1 application topically 2 (two) times daily. To affected areas. 60 g 3  . lisinopril (PRINIVIL,ZESTRIL) 10 MG tablet Take 1 tablet (10 mg total) by mouth 2 (two) times daily. 180 tablet 1  . mupirocin ointment (BACTROBAN) 2 % Apply to nose BID for 7 days. 30 g 3  . Omega-3 Fatty Acids (FISH OIL) 1000 MG CAPS Take 1 capsule by mouth daily.      Marland Kitchen terbinafine (LAMISIL) 250 MG tablet Take 1 tablet (250 mg total) by mouth daily. 14 tablet 0  . triamcinolone cream (KENALOG) 0.1 %  Apply 1 application topically 2 (two) times daily. 453.6 g 12  . vitamin C (ASCORBIC ACID) 500 MG tablet Take 500 mg by mouth daily.      . tacrolimus (PROTOPIC) 0.1 % ointment Apply topically 2 (two) times daily. 100 g 12   No current facility-administered medications for this visit.    Allergies  Allergen Reactions  . Penicillins Shortness Of Breath    Reaction as a child  . Effexor [Venlafaxine]     "dopey"    Health Maintenance Health Maintenance  Topic Date Due  . Fecal DNA (Cologuard)  12/27/2017 (Originally 11/22/2004)  . TETANUS/TDAP  06/27/2018 (Originally 11/22/1973)  . MAMMOGRAM  02/10/2018  . INFLUENZA VACCINE  02/28/2018  . PAP SMEAR  03/28/2020  . Hepatitis C Screening  Completed  . HIV Screening   Completed     Exam:  BP (!) 145/68   Pulse 68   Wt 198 lb (89.8 kg)   BMI 33.99 kg/m  Gen: Well NAD HEENT: EOMI,  MMM Lungs: Normal work of breathing. CTABL Heart: RRR no MRG Abd: NABS, Soft. Nondistended, Nontender Exts: Brisk capillary refill, warm and well perfused.  Right ankle: Mildly swollen laterally with no erythema. Mildly tender to palpation at the ATFL area.  Otherwise nontender. Normal ankle motion. Stable ligamentous exam. Intact strength. Skin: Maculopapular erythematous lesions on upper extremities consistent with atopic dermatitis.  Right ankle x-ray my personal interpretation of the images shows no fractures.  No acute changes.  Awaiting formal radiology review    Assessment and Plan: 62 y.o. female with  Right ankle pain likely sprain or exacerbation of history of sprain.  Plan for lace up ankle brace, ice, and home physical therapy activities.  Recheck in 6 weeks.  Atopic dermatitis: Not fully controlled.  Plan to continue steroid creams as noted in medication list.  We will add tacrolimus topical cream.  If not better next step would be [place].   Recheck 6 weeks.   Recheck BP then.    Orders Placed This Encounter  Procedures  . DG Ankle Complete Right    Standing Status:   Future    Number of Occurrences:   1    Standing Expiration Date:   01/31/2019    Order Specific Question:   Reason for Exam (SYMPTOM  OR DIAGNOSIS REQUIRED)    Answer:   eval right ankle pain. hx sprain    Order Specific Question:   Preferred imaging location?    Answer:   [facility]    Order Specific Question:   Radiology Contrast Protocol - do NOT remove file path    Answer:   [email or web address]   Meds ordered this encounter  Medications  . tacrolimus (PROTOPIC) 0.1 % ointment    Sig: Apply topically 2 (two) times daily.    Dispense:  100 g    Refill:  12     Discussed warning signs or symptoms. Please see discharge  instructions. Patient expresses understanding.

## 2017-11-30 NOTE — Patient Instructions (Addendum)
Thank you for coming in today. Get an ankle brace for ankle sprain.  Do the exercises we discussed.  Apply ice for 10-35mins 1-2x dialy.  Tylenol or ibuprofen for pain.  Recheck as needed.   For rash.  Continue current treatment.  Add Tacrolimus cream.  If not improving next step is [place].  Recheck in 6 weeks.    Ankle Sprain, Phase I Rehab Ask your health care provider which exercises are safe for you. Do exercises exactly as told by your health care provider and adjust them as directed. It is normal to feel mild stretching, pulling, tightness, or discomfort as you do these exercises, but you should stop right away if you feel sudden pain or your pain gets worse.Do not begin these exercises until told by your health care provider. Stretching and range of motion exercises These exercises warm up your muscles and joints and improve the movement and flexibility of your lower leg and ankle. These exercises also help to relieve pain and stiffness. Exercise A: Gastroc and soleus stretch  1. Sit on the floor with your left / right leg extended. 2. Loop a belt or towel around the ball of your left / right foot. The ball of your foot is on the walking surface, right under your toes. 3. Keep your left / right ankle and foot relaxed and keep your knee straight while you use the belt or towel to pull your foot toward you. You should feel a gentle stretch behind your calf or knee. 4. Hold this position for __________ seconds, then release to the starting position. Repeat the exercise with your knee bent. You can put a pillow or a rolled bath towel under your knee to support it. You should feel a stretch deep in your calf or at your Achilles tendon. Repeat each stretch __________ times. Complete these stretches __________ times a day. Exercise B: Ankle alphabet  1. Sit with your left / right leg supported at the lower leg. ? Do not rest your foot on anything. ? Make sure your foot has room to  move freely. 2. Think of your left / right foot as a paintbrush, and move your foot to trace each letter of the alphabet in the air. Keep your hip and knee still while you trace. Make the letters as large as you can without feeling discomfort. 3. Trace every letter from A to Z. Repeat __________ times. Complete this exercise __________ times a day. Strengthening exercises These exercises build strength and endurance in your ankle and lower leg. Endurance is the ability to use your muscles for a long time, even after they get tired. Exercise C: Dorsiflexors  1. Secure a rubber exercise band or tube to an object, such as a table leg, that will stay still when the band is pulled. Secure the other end around your left / right foot. 2. Sit on the floor facing the object, with your left / right leg extended. The band or tube should be slightly tense when your foot is relaxed. 3. Slowly bring your foot toward you, pulling the band tighter. 4. Hold this position for __________ seconds. 5. Slowly return your foot to the starting position. Repeat __________ times. Complete this exercise __________ times a day. Exercise D: Plantar flexors  1. Sit on the floor with your left / right leg extended. 2. Loop a rubber exercise tube or band around the ball of your left / right foot. The ball of your foot is on the walking  surface, right under your toes. ? Hold the ends of the band or tube in your hands. ? The band or tube should be slightly tense when your foot is relaxed. 3. Slowly point your foot and toes downward, pushing them away from you. 4. Hold this position for __________ seconds. 5. Slowly return your foot to the starting position. Repeat __________ times. Complete this exercise __________ times a day. Exercise E: Evertors 1. Sit on the floor with your legs straight out in front of you. 2. Loop a rubber exercise band or tube around the ball of your left / right foot. The ball of your foot is on the  walking surface, right under your toes. ? Hold the ends of the band in your hands, or secure the band to a stable object. ? The band or tube should be slightly tense when your foot is relaxed. 3. Slowly push your foot outward, away from your other leg. 4. Hold this position for __________ seconds. 5. Slowly return your foot to the starting position. Repeat __________ times. Complete this exercise __________ times a day. This information is not intended to replace advice given to you by your health care provider. Make sure you discuss any questions you have with your health care provider. Document Released: 02/15/2005 Document Revised: 03/23/2016 Document Reviewed: 05/31/2015 Elsevier Interactive Patient Education  2018 [employer].   Ankle Sprain, Phase II Rehab Ask your health care provider which exercises are safe for you. Do exercises exactly as told by your health care provider and adjust them as directed. It is normal to feel mild stretching, pulling, tightness, or discomfort as you do these exercises, but you should stop right away if you feel sudden pain or your pain gets worse.Do not begin these exercises until told by your health care provider. Stretching and range of motion exercises These exercises warm up your muscles and joints and improve the movement and flexibility of your lower leg and ankle. These exercises also help to relieve pain and stiffness. Exercise A: Gastroc stretch, standing  1. Stand with your hands against a wall. 2. Extend your left / right leg behind you, and bend your front knee slightly. Your heels should be on the floor. 3. Keeping your heels on the floor and your back knee straight, shift your weight toward the wall. You should feel a gentle stretch in the back of your lower leg (calf). 4. Hold this position for __________ seconds. Repeat __________ times. Complete this exercise __________ times a day. Exercise B: Soleus stretch, standing 1. Stand with  your hands against a wall. 2. Extend your left / right leg behind you, and bend your front knee slightly. Both of your heels should be on the floor. 3. Keeping your heels on the floor, bend your back knee and shift your weight slightly over your back leg. You should feel a gentle stretch deep in your calf. 4. Hold this position for __________ seconds. Repeat __________ times. Complete this exercise __________ times a day. Strengthening exercises These exercises build strength and endurance in your lower leg. Endurance is the ability to use your muscles for a long time, even after they get tired. Exercise C: Heel walking ( dorsiflexion) Walk on your heels for __________ seconds or ___________ ft. Keep your toes as high as possible. Repeat __________ times. Complete this exercise __________ times a day. Balance exercises These exercises improve your balance and the reaction and control of your ankle to help improve stability. Exercise D: Multi-angle lunge  1. Stand with your feet together. 2. Take a step forward with your left / right leg, and shift your weight onto that leg. Your back heel will come off the floor, and your back toes will stay in place. 3. Push off your front leg to return your front foot to the starting position next to your other foot. 4. Repeat to the side, to the back, and any other directions as told by your health care provider. Repeat in each direction __________ times. Complete this exercise __________ times a day. Exercise E: Single leg stand 1. Without shoes, stand near a railing or in a door frame. Hold onto the railing or door frame as needed. 2. Stand on your left / right foot. Keep your big toe down on the floor and try to keep your arch lifted. 3. Hold this position for __________ seconds. Repeat __________ times. Complete this exercise __________ times a day. If this exercise is too easy, you can try it with your eyes closed or while standing on a  pillow. Exercise F: Inversion/eversion  You will need a balance board for this exercise. Ask your health care provider where you can get a balance board or how you can make one. 1. Stand on a non-carpeted surface near a countertop or wall. 2. Step onto the balance board so your feet are hip-width apart. 3. Keep your feet in place and keep your upper body and hips steady. Using only your feet and ankles to move the board, do one or both of the following exercises as told by your health care provider: ? Tip the board side to side as far as you can, alternating between tipping to the left and tipping to the right. If you can, tip the board so it silently taps the floor. Do not let the board forcefully hit the floor. From time to time, pause to hold a steady position. ? Tip the board side to side so the board does not hit the floor at all. From time to time, pause to hold a steady position. Repeat the movement for each exercise __________ times. Complete each exercise __________ times a day. Exercise G: Plantar flexion/dorsiflexion  You will need a balance board for this exercise. Ask your health care provider where you can get a balance board or how you can make one. 1. Stand on a non-carpeted surface near a countertop or wall. 2. Step onto the balance board so your feet are hip-width apart. 3. Keep your feet in place and keep your upper body and hips steady. Using only your feet and ankles to move the board, do one or both of the following exercises as told by your health care provider: ? Tip the board forward and backward so the board silently taps the floor. Do not let the board forcefully hit the floor. From time to time, pause to hold a steady position. ? Tip the board forward and backward so the board does not hit the floor at all. From time to time, pause to hold a steady position. Repeat the movement for each exercise __________ times. Complete each exercise __________ times a day. This  information is not intended to replace advice given to you by your health care provider. Make sure you discuss any questions you have with your health care provider. Document Released: 11/06/2005 Document Revised: 03/23/2016 Document Reviewed: 05/31/2015 Elsevier Interactive Patient Education  2018 [employer].

## 2017-12-02 ENCOUNTER — Encounter: Payer: Self-pay | Admitting: Family Medicine

## 2017-12-04 MED ORDER — CRISABOROLE 2 % EX OINT: 1.0000 "application " | TOPICAL_OINTMENT | Freq: Two times a day (BID) | CUTANEOUS | 12 refills | Status: DC

## 2017-12-05 ENCOUNTER — Other Ambulatory Visit: Payer: BLUE CROSS/BLUE SHIELD

## 2017-12-12 ENCOUNTER — Encounter: Payer: Self-pay | Admitting: Family Medicine

## 2017-12-13 ENCOUNTER — Encounter: Payer: Self-pay | Admitting: Family Medicine

## 2017-12-17 ENCOUNTER — Encounter: Payer: Self-pay | Admitting: Family Medicine

## 2017-12-19 ENCOUNTER — Other Ambulatory Visit: Payer: BLUE CROSS/BLUE SHIELD

## 2017-12-20 ENCOUNTER — Encounter: Payer: Self-pay | Admitting: Family Medicine

## 2017-12-20 ENCOUNTER — Ambulatory Visit: Payer: BLUE CROSS/BLUE SHIELD | Admitting: Family Medicine

## 2017-12-20 VITALS — BP 146/61 | HR 88 | Ht 65.0 in | Wt 196.0 lb

## 2017-12-20 DIAGNOSIS — L2082 Flexural eczema: Secondary | ICD-10-CM | POA: Diagnosis not present

## 2017-12-20 DIAGNOSIS — I10 Essential (primary) hypertension: Secondary | ICD-10-CM | POA: Diagnosis not present

## 2017-12-20 DIAGNOSIS — R768 Other specified abnormal immunological findings in serum: Secondary | ICD-10-CM

## 2017-12-20 DIAGNOSIS — R21 Rash and other nonspecific skin eruption: Secondary | ICD-10-CM

## 2017-12-20 DIAGNOSIS — R7989 Other specified abnormal findings of blood chemistry: Secondary | ICD-10-CM | POA: Diagnosis not present

## 2017-12-20 NOTE — Progress Notes (Signed)
[name] is a 62 y.o. female who presents to [facility] today for rash.  [name] notes continuing bothersome rash on her upper extremities and trunk.  She is been evaluated for this in the past by both myself and by dermatology.  Per patient report she notes her dermatologist diagnosed her with eczema.  I have been suspicious for eczema or lichen planus in the past.  She currently is being treated with Lidex ointment.  She uses this 3 times a day and continues to have bothersome pruritic rash.  She attributes her symptoms to her living situation.  She lives in an apartment that has a very old carpet and an old mattress.  She thinks that she is likely allergic to some of the things in her apartment.  She is worried that she may have become infected with a fungus or with scabies.  She denies fevers or chills vomiting or diarrhea.   ROS as above:  Exam:  BP (!) 146/61   Pulse 88   Ht 5\' 5"  (1.651 m)   Wt 196 lb (88.9 kg)   BMI 32.62 kg/m  Gen: Well NAD HEENT: EOMI,  MMM Lungs: Normal work of breathing. CTABL Heart: RRR no MRG Abd: NABS, Soft. Nondistended, Nontender Exts: Brisk capillary refill, warm and well perfused.  Skin: Macular mildly hyperpigmented purplish large lesions on forearms with scaly edges.  Some area of small macules with scale also present on trunk.  Nontender.  Pruritic.  Skin scraping: I obtained skin scraping which I have sent to the lab for KOH prep.  Prior to lab I viewed under a microscope.  No scabies were visible.        Assessment and Plan: 62 y.o. female with  Bothersome rash.  Patient has had treatment that should be adequate for atopic dermatitis.  She is using a high potency topical steroid 3 times daily with only mild benefit.  I think lichen planus is certainly a possibility.  Is additionally possible that she does have a  dermatophyte that is partially treated with steroids.  Fungal KOH prep pending. We may consider treating empirically with oral antifungals in the future. Additionally will start basic work-up for possible lupus.  If not better and diagnosed remains unclear if skin biopsy would be a good next step.  Hypertension: Blood pressure bit elevated.  Plan for watchful waiting.   Orders Placed This Encounter  Procedures  . Fungal Stain  . ANA  . Sedimentation rate   No orders of the defined types were placed in this encounter.    Historical information moved to improve visibility of documentation.  Past Medical History:  Diagnosis Date  . Anxiety   . Asthma   . Chest pain, atypical   . Closed head injury 06/06/2017  . Heart palpitations    hx of MVP  . IBS (irritable bowel syndrome)    Past Surgical History:  Procedure Laterality Date  . eAB     x2    Social History   Tobacco Use  . Smoking status: Never Smoker  . Smokeless tobacco: Never Used  . Tobacco comment: nonsmoker  Substance Use Topics  . Alcohol use: No   family history includes Heart attack in her father; Lung cancer in her mother.  Medications: Current Outpatient Medications  Medication Sig Dispense Refill  . albuterol (PROAIR HFA) 108 (90 Base) MCG/ACT inhaler Inhale 2 puffs into the lungs every 6 (  six) hours as needed for wheezing. 1 Inhaler 6  . AMBULATORY NON FORMULARY MEDICATION Freestyle promise meter Use daily as directed 1 each 0  . AMBULATORY NON FORMULARY MEDICATION Freestyle test strips To use with freestyle promise meter  Test blood sugar twice a day 100 each 11  . aspirin 81 MG tablet Take 1 tablet (81 mg total) by mouth daily. 30 tablet   . B Complex-C (B-COMPLEX WITH VITAMIN C) tablet Take 1 tablet by mouth daily.      . betamethasone dipropionate (DIPROLENE) 0.05 % cream   1  . Calcium-Magnesium-Vitamin D (CALCIUM MAGNESIUM PO) Take by mouth.    . citalopram (CELEXA) 10 MG tablet TAKE 1  TABLET BY MOUTH EVERY DAY 90 tablet 0  . citalopram (CELEXA) 20 MG tablet Take 1 tablet (20 mg total) by mouth daily. 90 tablet 1  . Crisaborole (EUCRISA) 2 % OINT Apply 1 application topically 2 (two) times daily. 60 g 12  . fluocinonide ointment (LIDEX) 2.72 % Apply 1 application topically 2 (two) times daily. 30 g 1  . Garlic Oil 5366 MG CAPS 1 capsule 2 (two) times daily.      . hydrOXYzine (ATARAX/VISTARIL) 25 MG tablet Take 1 tablet (25 mg total) by mouth 3 (three) times daily as needed for anxiety. 30 tablet 1  . ketoconazole (NIZORAL) 2 % cream Apply 1 application topically 2 (two) times daily. To affected areas. 60 g 3  . lisinopril (PRINIVIL,ZESTRIL) 10 MG tablet Take 1 tablet (10 mg total) by mouth 2 (two) times daily. 180 tablet 1  . mupirocin ointment (BACTROBAN) 2 % Apply to nose BID for 7 days. 30 g 3  . Omega-3 Fatty Acids (FISH OIL) 1000 MG CAPS Take 1 capsule by mouth daily.      . tacrolimus (PROTOPIC) 0.1 % ointment Apply topically 2 (two) times daily. 100 g 12  . triamcinolone cream (KENALOG) 0.1 % Apply 1 application topically 2 (two) times daily. 453.6 g 12  . vitamin C (ASCORBIC ACID) 500 MG tablet Take 500 mg by mouth daily.       No current facility-administered medications for this visit.    Allergies  Allergen Reactions  . Penicillins Shortness Of Breath    Reaction as a child  . Effexor [Venlafaxine]     "dopey"    Health Maintenance Health Maintenance  Topic Date Due  . Fecal DNA (Cologuard)  12/27/2017 (Originally 11/22/2004)  . TETANUS/TDAP  06/27/2018 (Originally 11/22/1973)  . MAMMOGRAM  02/10/2018  . INFLUENZA VACCINE  02/28/2018  . PAP SMEAR  03/28/2020  . Hepatitis C Screening  Completed  . HIV Screening  Completed    Discussed warning signs or symptoms. Please see discharge instructions. Patient expresses understanding.

## 2017-12-20 NOTE — Progress Notes (Signed)
Rash  

## 2017-12-20 NOTE — Patient Instructions (Addendum)
Thank you for coming in today.  I will get results from the skin scraping and the blood test soon.  Continue current treatment.  Recheck as needed.    Lichen Planus Lichen planus is a skin problem that causes redness, itching, small bumps, and sores. It can affect the skin in any area of the body. Some common areas affected include:  Arms, wrists, legs, or ankles.  Chest, back, or abdomen.  Genital areas such as the vulva and vagina.  Gums and inside of the mouth.  Scalp.  Fingernails or toenails.  Treatment can help control the symptoms of this condition. The condition can last for a long time. It can take 6-18 months for it to go away. What are the causes? The exact cause of this condition is not known. The condition is not passed from one person to another (not contagious). It may be related to an allergy or an autoimmune response. An autoimmune response occurs when the body's defense system (immune system) mistakenly attacks healthy tissues. What increases the risk? This condition is more likely to develop in:  People who are older than 63 years of age.  People who take certain medicines.  People who have been exposed to certain dyes or chemicals.  People with hepatitis C.  What are the signs or symptoms? Symptoms of this condition may include:  Itching, which can be severe.  Small reddish or purple bumps on the skin. These may have flat tops and may be round or irregular shaped.  Redness or white patches on the gums or tongue.  Redness, soreness, or a burning feeling in the genital area. This may lead to pain or bleeding during sex.  Changes in the fingernails or toenails. The nails may become thin or rough. They may have ridges in them.  Redness or irritation of the eyes. This is rare.  How is this diagnosed? This condition may be diagnosed based on:  A physical exam. The health care provider will examine your affected skin and check for changes inside your  mouth.  Removal of a tissue sample (biopsy sample) to be looked at under a microscope.  How is this treated? Treatment for this condition may depend on the severity of symptoms. In some cases, no treatment is needed. If treatment is needed to control symptoms, it may include:  Creams or ointments (topical steroids) to help control itching and irritation.  Medicine to be taken by mouth.  A treatment in which your skin is exposed to ultraviolet light (phototherapy).  Lozenges that you suck on to help treat sores in the mouth.  Follow these instructions at home:  Take over-the-counter and prescription medicines only as told by your health care provider.  Use creams or ointments as told by your health care provider.  Do not scratch the affected areas of skin.  Women should keep the vaginal area as clean and dry as possible. Contact a health care provider if:  You have increasing redness, swelling, or pain in the affected area.  You have fluid, blood, or pus coming from the affected area.  Your eyes become irritated. This information is not intended to replace advice given to you by your health care provider. Make sure you discuss any questions you have with your health care provider. Document Released: 12/08/2010 Document Revised: 12/23/2015 Document Reviewed: 10/12/2014 Elsevier Interactive Patient Education  [employer].

## 2017-12-24 LAB — FUNGAL STAIN
FUNGAL SMEAR:: NONE SEEN
MICRO NUMBER:: 90628418
SPECIMEN QUALITY: ADEQUATE

## 2017-12-25 ENCOUNTER — Encounter: Payer: Self-pay | Admitting: Family Medicine

## 2017-12-25 LAB — ANTI-NUCLEAR AB-TITER (ANA TITER): ANA Titer 1: 1:80 {titer} — ABNORMAL HIGH

## 2017-12-25 LAB — SEDIMENTATION RATE: Sed Rate: 9 mm/h (ref 0–30)

## 2017-12-25 LAB — ANA: Anti Nuclear Antibody(ANA): POSITIVE — AB

## 2017-12-25 NOTE — Addendum Note (Signed)
Addended by: Gregor Hams on: 12/25/2017 04:40 PM   Modules accepted: Orders

## 2017-12-26 ENCOUNTER — Encounter: Payer: Self-pay | Admitting: Family Medicine

## 2017-12-27 ENCOUNTER — Encounter: Payer: Self-pay | Admitting: Family Medicine

## 2017-12-28 ENCOUNTER — Ambulatory Visit (INDEPENDENT_AMBULATORY_CARE_PROVIDER_SITE_OTHER): Payer: BLUE CROSS/BLUE SHIELD | Admitting: Sports Medicine

## 2017-12-28 ENCOUNTER — Encounter: Payer: Self-pay | Admitting: Sports Medicine

## 2017-12-28 DIAGNOSIS — L300 Nummular dermatitis: Secondary | ICD-10-CM

## 2017-12-28 DIAGNOSIS — L2082 Flexural eczema: Secondary | ICD-10-CM | POA: Diagnosis not present

## 2017-12-28 MED ORDER — LORATADINE 10 MG PO TABS: 10.0000 mg | ORAL_TABLET | Freq: Every day | ORAL | 3 refills | Status: DC

## 2017-12-28 MED ORDER — METHYLPREDNISOLONE ACETATE 80 MG/ML IJ SUSP
80.0000 mg | Freq: Once | INTRAMUSCULAR | Status: AC
  Administered 2017-12-28: 80 mg via INTRAMUSCULAR

## 2017-12-28 MED ORDER — METHYLPREDNISOLONE SODIUM SUCC 125 MG IJ SOLR
125.0000 mg | Freq: Once | INTRAMUSCULAR | Status: AC
  Administered 2017-12-28: 125 mg via INTRAMUSCULAR

## 2017-12-28 NOTE — Addendum Note (Signed)
Addended by: Elizabeth Sauer on: 12/28/2017 03:01 PM   Modules accepted: Orders

## 2017-12-28 NOTE — Assessment & Plan Note (Signed)
Having a fairly widespread flare, it is very prevalent in the Flexeril parts of her knees, elbows. She does have a serpiginous, circular rash as well consistent with nummular eczema. High-dose steroids, Solu-Medrol 250, Depo-Medrol 80, hydroxyzine at night, Claritin in the morning. Return in 2 weeks, biopsy if no better, full-thickness punch. I did note a positive ANA but it has a very low titer.

## 2017-12-28 NOTE — Progress Notes (Signed)
Subjective:    CC: Skin rash  HPI: This is a pleasant 62 year old female, for several months now she has been dealing with a severe rash over her entire body, arms, legs, chest, back, abdomen, face.  This rash is predominant in the flexural creases of her elbows, knees, fairly circular, serpiginous.  Highly pruritic.  She did have a positive ANA with a low titer of 1:80.  Approximately 3 months ago she started adding Borax to her laundry detergent, this coincides with the emergence of her rash.  On further questioning she also has dealt with eczematous type rashes throughout her life.  Symptoms are severe, persistent.  No oropharyngeal or mucosal involvement.  No new medications.  I reviewed the past medical history, family history, social history, surgical history, and allergies today and no changes were needed.  Please see the problem list section below in epic for further details.  Past Medical History: Past Medical History:  Diagnosis Date  . Anxiety   . Asthma   . Chest pain, atypical   . Closed head injury 06/06/2017  . Heart palpitations    hx of MVP  . IBS (irritable bowel syndrome)    Past Surgical History: Past Surgical History:  Procedure Laterality Date  . eAB     x2    Social History: Social History   Socioeconomic History  . Marital status: Divorced    Spouse name: Not on file  . Number of children: Not on file  . Years of education: Not on file  . Highest education level: Not on file  Occupational History  . Not on file  Social Needs  . Financial resource strain: Not on file  . Food insecurity:    Worry: Not on file    Inability: Not on file  . Transportation needs:    Medical: Not on file    Non-medical: Not on file  Tobacco Use  . Smoking status: Never Smoker  . Smokeless tobacco: Never Used  . Tobacco comment: nonsmoker  Substance and Sexual Activity  . Alcohol use: No  . Drug use: No  . Sexual activity: Yes  Lifestyle  . Physical activity:   Days per week: Not on file    Minutes per session: Not on file  . Stress: Not on file  Relationships  . Social connections:    Talks on phone: Not on file    Gets together: Not on file    Attends religious service: Not on file    Active member of club or organization: Not on file    Attends meetings of clubs or organizations: Not on file    Relationship status: Not on file  Other Topics Concern  . Not on file  Social History Narrative   Divorced; grown son lives in [place].    [facility]- PCP   Family History: Family History  Problem Relation Age of Onset  . Heart attack Father   . Lung cancer Mother    Allergies: Allergies  Allergen Reactions  . Penicillins Shortness Of Breath    Reaction as a child  . Effexor [Venlafaxine]     "dopey"   Medications: See med rec.  Review of Systems: No fevers, chills, night sweats, weight loss, chest pain, or shortness of breath.   Objective:    General: Well Developed, well nourished, and in no acute distress.  Neuro: Alert and oriented x3, extra-ocular muscles intact, sensation grossly intact.  HEENT: Normocephalic, atraumatic, pupils equal round reactive to light, neck  supple, no masses, no lymphadenopathy, thyroid nonpalpable.  Skin: Warm and dry, circular, serpiginous rash with a scaly leading edge without satellite lesions over the entire body, face, chest, breasts, arms, legs, and fairly severe in the flexor creases of the elbows and the knees.  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 Cardiac: Regular rate and rhythm, no murmurs rubs or gallops, no lower extremity edema.  Respiratory: Clear to auscultation bilaterally. Not using accessory muscles, speaking in full sentences.  Solu-Medrol 250, Depo-Medrol 80 given in the office intramuscular.  Impression and Recommendations:    Nummular eczema Having a fairly widespread flare, it is very prevalent in the Flexeril parts of her  knees, elbows. She does have a serpiginous, circular rash as well consistent with nummular eczema. High-dose steroids, Solu-Medrol 250, Depo-Medrol 80, hydroxyzine at night, Claritin in the morning. Return in 2 weeks, biopsy if no better, full-thickness punch. I did note a positive ANA but it has a very low titer.  I spent 40 minutes with this patient, greater than 50% was face-to-face time counseling regarding the above diagnoses, most of this was spent discussing the mechanism, effects and possible adverse effects of the parenteral medications given today. ___________________________________________ Gwen Her. Dianah Field, M.D., ABFM., CAQSM. Primary Care and [place] Instructor of [employer] [facility]

## 2017-12-28 NOTE — Patient Instructions (Signed)
Nummular Eczema Nummular eczema is a common disease that causes red, circular, crusted (plaque) lesions that may be itchy. It most commonly affects the lower legs and the backs of hands. Men tend to get their first outbreak between 55 and 63 years of age, and women tend to get their first outbreak during their teen or young adult years. What are the causes? The cause of this condition is not known. It may be related to certain skin sensitivities, such as sensitivity to:  Metals such as nickel and, rarely, mercury.  Formaldehyde.  Antibiotic medicine that is applied to the skin, such as neomycin.  What increases the risk? You are more likely to develop this condition if:  You have very dry skin.  You live in a place with dry and cold weather.  You have a personal or family history of eczema, asthma, or allergies.  You drink alcohol.  You have poor blood flow (circulation).  What are the signs or symptoms? Symptoms most commonly affect the lower legs, but may also affect the hands, torso, arms, or feet. Symptoms include:  Groups of tiny, red spots.  Blister-like sores that leak fluid. These sores may grow together and form circular patches. After a long time, they may become crusty and then scaly.  Well-defined patches of pink, red, or brown skin.  Itchiness and burning, ranging from mild to severe. Itchiness may be worse at night and may cause trouble sleeping. Scratching lesions can cause bleeding.  How is this diagnosed? This condition is diagnosed based on a physical exam and your medical history. Usually more tests are not needed, but you may need a swab test to check for skin infection. This test involves swabbing an affected area and testing the sample for bacteria (culture). You may work with a health care provider who specializes in the skin (dermatologist) to help diagnose and treat this condition. How is this treated? There is no cure for this condition, but treatment  can help relieve symptoms. Depending on how severe your symptoms are, your healthcare provider may suggest:  Medicine applied to the skin to reduce swelling and irritation (topical corticosteroids).  Medicine taken by mouth to reduce itching (oral antihistamines).  Antibiotic medicine to take by mouth (oral antibiotic) or to apply to your skin (topical antibiotic), if you have a skin infection.  Light therapy (phototherapy). This involves shining ultraviolet (UV) light on affected skin to reduce itchiness and inflammation.  Soaking in a bath that contains a type of salt that dries out blisters (potassium permanganate soaks).  Follow these instructions at home: Medicines  Take over-the-counter and prescription medicines only as told by your health care provider.  If you were prescribed an antibiotic, take or apply it as told by your health care provider. Do not stop using the antibiotic even if you start to feel better. Skin Care  Keep your fingernails short to avoid breaking open the skin when you scratch.  Wash your hands with mild soap and water to avoid infection.  Pat your skin dry after bathing or washing your hands. Avoid rubbing your skin.  Keep your skin hydrated. To do this: ? Avoid very hot water. Take lukewarm baths or showers. ? Apply moisturizer within three minutes of bathing. This locks in moisture. ? Use a humidifier when you have the heating or air conditioning on. This will add moisture to the air.  Identify and avoid things that trigger symptoms or irritate your skin. Triggers may include taking long, hot showers   or baths, or not using creams or ointments to moisturize. Certain soaps may also trigger this condition. General instructions  Dress in clothes made of cotton or cotton blends. Avoid wearing clothes with wool fabric.  Avoid activities that may cause skin injury. Wear protective clothing when doing outdoor activities such as gardening or hiking. Cuts,  scrapes, and insect bites can make symptoms worse.  Keep all follow-up visits as told by your health care provider. This is important. Contact a health care provider if:  You develop a yellowish crust on an area of affected skin.  You have symptoms that do not go away with treatment or home care methods. Get help right away if:  You have more redness, pain, pus, or swelling. Summary  Nummular eczema is a common disease that causes red, circular, crusted (plaque) lesions that may be itchy.  The cause of this condition is not known. It may be related to certain skin sensitivities.  Treatments may include medicines to reduce swelling and irritation, avoiding triggers, and keeping your skin hydrated. This information is not intended to replace advice given to you by your health care provider. Make sure you discuss any questions you have with your health care provider. Document Released: 11/30/2016 Document Revised: 11/30/2016 Document Reviewed: 11/30/2016 Elsevier Interactive Patient Education  2018 [employer]  

## 2017-12-29 ENCOUNTER — Encounter: Payer: Self-pay | Admitting: Family Medicine

## 2017-12-29 ENCOUNTER — Encounter: Payer: Self-pay | Admitting: Sports Medicine

## 2017-12-30 ENCOUNTER — Encounter: Payer: Self-pay | Admitting: Family Medicine

## 2017-12-31 ENCOUNTER — Encounter: Payer: Self-pay | Admitting: Sports Medicine

## 2018-01-01 ENCOUNTER — Encounter: Payer: Self-pay | Admitting: Family Medicine

## 2018-01-01 NOTE — Telephone Encounter (Signed)
Not sure what to make of her messages.  She did have the positive ANA and I told her that its reasonable to have rheum weigh in but we will probably have to work together to help control her anxiety/depression before they stop.  I'm happy to comanage, if she sends another email I'll remind her to keep it to just 1 at a time. :-P

## 2018-01-03 ENCOUNTER — Encounter: Payer: Self-pay | Admitting: Family Medicine

## 2018-01-04 MED ORDER — CITALOPRAM HYDROBROMIDE 10 MG PO TABS: 20.0000 mg | ORAL_TABLET | Freq: Every day | ORAL | 0 refills | Status: DC

## 2018-01-07 ENCOUNTER — Encounter: Payer: Self-pay | Admitting: Sports Medicine

## 2018-01-08 ENCOUNTER — Encounter: Payer: Self-pay | Admitting: Family Medicine

## 2018-01-08 MED ORDER — BUDESONIDE-FORMOTEROL FUMARATE 160-4.5 MCG/ACT IN AERO: 2.0000 | INHALATION_SPRAY | Freq: Two times a day (BID) | RESPIRATORY_TRACT | 3 refills | Status: DC

## 2018-01-14 ENCOUNTER — Encounter: Payer: Self-pay | Admitting: Sports Medicine

## 2018-01-14 ENCOUNTER — Encounter: Payer: Self-pay | Admitting: Family Medicine

## 2018-01-15 ENCOUNTER — Other Ambulatory Visit: Payer: Self-pay | Admitting: Sports Medicine

## 2018-01-15 ENCOUNTER — Encounter: Payer: Self-pay | Admitting: Sports Medicine

## 2018-01-16 NOTE — Addendum Note (Signed)
Addended by: Gregor Hams on: 01/16/2018 07:37 AM   Modules accepted: Orders

## 2018-01-18 ENCOUNTER — Encounter: Payer: Self-pay | Admitting: Family Medicine

## 2018-01-20 ENCOUNTER — Encounter: Payer: Self-pay | Admitting: Family Medicine

## 2018-01-21 ENCOUNTER — Encounter: Payer: Self-pay | Admitting: Sports Medicine

## 2018-01-24 ENCOUNTER — Encounter: Payer: Self-pay | Admitting: Family Medicine

## 2018-01-27 ENCOUNTER — Encounter: Payer: Self-pay | Admitting: Family Medicine

## 2018-02-02 ENCOUNTER — Encounter: Payer: Self-pay | Admitting: Sports Medicine

## 2018-02-03 ENCOUNTER — Encounter: Payer: Self-pay | Admitting: Family Medicine

## 2018-02-04 MED ORDER — PREDNISONE 5 MG (48) PO TBPK: ORAL_TABLET | ORAL | 0 refills | Status: DC

## 2018-02-05 ENCOUNTER — Encounter: Payer: Self-pay | Admitting: Family Medicine

## 2018-02-07 ENCOUNTER — Encounter: Payer: Self-pay | Admitting: Family Medicine

## 2018-02-07 ENCOUNTER — Encounter: Payer: Self-pay | Admitting: Sports Medicine

## 2018-02-09 ENCOUNTER — Emergency Department
Admission: EM | Admit: 2018-02-09 | Discharge: 2018-02-09 | Disposition: A | Discharge status: Home / Self Care | Payer: BLUE CROSS/BLUE SHIELD | Source: Home / Self Care

## 2018-02-09 ENCOUNTER — Encounter: Payer: Self-pay | Admitting: Family Medicine

## 2018-02-09 ENCOUNTER — Encounter: Payer: Self-pay | Admitting: Emergency Medicine

## 2018-02-09 ENCOUNTER — Encounter: Payer: Self-pay | Admitting: Sports Medicine

## 2018-02-09 DIAGNOSIS — L309 Dermatitis, unspecified: Secondary | ICD-10-CM | POA: Diagnosis not present

## 2018-02-09 MED ORDER — DOXYCYCLINE HYCLATE 100 MG PO CAPS: 100.0000 mg | ORAL_CAPSULE | Freq: Two times a day (BID) | ORAL | 0 refills | Status: DC

## 2018-02-09 MED ORDER — METHYLPREDNISOLONE SODIUM SUCC 125 MG IJ SOLR
80.0000 mg | Freq: Once | INTRAMUSCULAR | Status: AC
  Administered 2018-02-09: 80 mg via INTRAMUSCULAR

## 2018-02-09 MED ORDER — OLOPATADINE HCL 0.1 % OP SOLN: 1.0000 [drp] | Freq: Two times a day (BID) | OPHTHALMIC | 12 refills | Status: DC | PRN

## 2018-02-09 NOTE — Discharge Instructions (Addendum)
I have prescribed doxycycline 100 mg twice daily for 10 days for treatment of your skin infection which I suspect is early cellulitis.  Take all medication as prescribed and take with food to avoid stomach upset.  Avoid scratching skin as this can worsen infection of the skin.  Continue to use topical steroid oral ointments as prescribed by your PCP.  Keep appointment with rheumatology. I have also prescribed you olopatadine drops for eye itching.  Use as needed twice daily for eye itching.  If symptoms worsen or do not improve please follow-up with your PCP.

## 2018-02-09 NOTE — ED Triage Notes (Signed)
Pt c/o rash on entire body for months. States worse in the last week.

## 2018-02-11 ENCOUNTER — Encounter: Payer: Self-pay | Admitting: Family Medicine

## 2018-02-11 NOTE — ED Provider Notes (Signed)
[facility]    CSN: [id number] Arrival date & time: 02/09/18  1517  History   Chief Complaint Chief Complaint  Patient presents with  . Rash   HPI [name] is a 62 y.o. female presents today for evaluation of worsening of a chronic rash.  Patient is followed by [facility] primary care and sees Dr. Georgina Snell who has referred patient to rheumatology for evaluation of recurrent rash and positive ANA.  In review of notes rash has been referred to his eczema.  Patient has had multiple steroid injections and prescribed topical steroid oil preparations for management of rash. Patient reports that the injections had calm the rash down and over the course of the last week she is been under a great deal of stress and has noticed a worsening of the rash.  She is concerned regarding her left arm as it is warm to touch and certain areas of the rash draining a puslike substance.  She is also concerned as her eyes have become increasingly itchy and eyelids are now affected by a similar type rash which is mainly been on her neck chest and arms.  She has been negative for fever although complains of chills. She has a follow-up scheduled on February 28, 2018 with rheumatology and reports she has been attempting to get in to be seen with rheumatology prior to this date.  She is requesting a steroidal injection. Past Medical History:  Diagnosis Date  . Anxiety   . Asthma   . Chest pain, atypical   . Closed head injury 06/06/2017  . Heart palpitations    hx of MVP  . IBS (irritable bowel syndrome)     Patient Active Problem List   Diagnosis Date Noted  . Abnormal fasting glucose 10/11/2017  . Seborrheic dermatitis 07/19/2017  . Allergic conjunctivitis 07/19/2017  . Gastritis 06/27/2017  . BMI 33.0-33.9,adult 05/03/2017  . Palpitation 08/29/2016  . Insomnia 09/28/2015  . Menopausal symptom 05/12/2014  . Asthma, persistent controlled 02/26/2014  . Nummular eczema 02/26/2014  . Prediabetes  02/26/2014  . Hyperlipidemia 12/31/2013  . Essential hypertension 08/23/2012  . GAD (generalized anxiety disorder) 05/01/2012    Past Surgical History:  Procedure Laterality Date  . eAB     x2     OB History    Gravida  3   Para  1   Term      Preterm      AB  2   Living        SAB      TAB  2   Ectopic      Multiple      Live Births               Home Medications    Prior to Admission medications   Medication Sig Start Date End Date Taking? Authorizing Provider  albuterol (PROAIR HFA) 108 (90 Base) MCG/ACT inhaler Inhale 2 puffs into the lungs every 6 (six) hours as needed for wheezing. 03/07/17 03/07/18  Gregor Hams, MD  AMBULATORY NON FORMULARY MEDICATION Freestyle promise meter Use daily as directed 05/05/14   Marcial Pacas, DO  AMBULATORY NON FORMULARY MEDICATION Freestyle test strips To use with freestyle promise meter  Test blood sugar twice a day 03/03/15   Marcial Pacas, DO  aspirin 81 MG tablet Take 1 tablet (81 mg total) by mouth daily. 02/26/14   Hommel, Hilliard Clark, DO  B Complex-C (B-COMPLEX WITH VITAMIN C) tablet Take 1 tablet by mouth daily.  [provider]  betamethasone dipropionate (DIPROLENE) 0.05 % cream  09/07/17   [provider]  budesonide-formoterol (SYMBICORT) 160-4.5 MCG/ACT inhaler Inhale 2 puffs into the lungs 2 (two) times daily. 01/08/18   Gregor Hams, MD  Calcium-Magnesium-Vitamin D (CALCIUM MAGNESIUM PO) Take by mouth.    [provider]  citalopram (CELEXA) 10 MG tablet Take 2 tablets (20 mg total) by mouth daily. 01/04/18   Gregor Hams, MD  Crisaborole (EUCRISA) 2 % OINT Apply 1 application topically 2 (two) times daily. 12/04/17   Gregor Hams, MD  doxycycline (VIBRAMYCIN) 100 MG capsule Take 1 capsule (100 mg total) by mouth 2 (two) times daily. 02/09/18   Scot Jun, FNP  fluocinonide ointment (LIDEX) 0.26 % Apply 1 application topically 2 (two) times daily. 09/18/17   Gregor Hams, MD  Garlic  Oil 3785 MG CAPS 1 capsule 2 (two) times daily.      [provider]  hydrOXYzine (ATARAX/VISTARIL) 25 MG tablet TAKE 1 TABLET (25 MG TOTAL) BY MOUTH 3 (THREE) TIMES DAILY AS NEEDED FOR ANXIETY. 01/15/18   Silverio Decamp, MD  ketoconazole (NIZORAL) 2 % cream Apply 1 application topically 2 (two) times daily. To affected areas. 07/19/17   Gregor Hams, MD  lisinopril (PRINIVIL,ZESTRIL) 10 MG tablet Take 1 tablet (10 mg total) by mouth 2 (two) times daily. 10/31/17   Gregor Hams, MD  loratadine (CLARITIN) 10 MG tablet Take 1 tablet (10 mg total) by mouth daily. 12/28/17   Silverio Decamp, MD  mupirocin ointment (BACTROBAN) 2 % Apply to nose BID for 7 days. 12/13/16   Gregor Hams, MD  olopatadine (PATANOL) 0.1 % ophthalmic solution Place 1 drop into both eyes 2 (two) times daily as needed for allergies. 02/09/18   Scot Jun, FNP  Omega-3 Fatty Acids (FISH OIL) 1000 MG CAPS Take 1 capsule by mouth daily.      [provider]  predniSONE (STERAPRED UNI-PAK 48 TAB) 5 MG (48) TBPK tablet 12 day dosepack po 02/04/18   Gregor Hams, MD  tacrolimus (PROTOPIC) 0.1 % ointment Apply topically 2 (two) times daily. 11/30/17   Gregor Hams, MD  triamcinolone cream (KENALOG) 0.1 % Apply 1 application topically 2 (two) times daily. 09/18/17   Gregor Hams, MD  vitamin C (ASCORBIC ACID) 500 MG tablet Take 500 mg by mouth daily.      [provider]    Family History Family History  Problem Relation Age of Onset  . Heart attack Father   . Lung cancer Mother     Social History Social History   Tobacco Use  . Smoking status: Never Smoker  . Smokeless tobacco: Never Used  . Tobacco comment: nonsmoker  Substance Use Topics  . Alcohol use: No  . Drug use: No     Allergies   Penicillins and Effexor [venlafaxine]   Review of Systems Review of Systems Pertinent negatives listed in HPI  Physical Exam Triage Vital Signs ED Triage Vitals [02/09/18 1604]    Enc Vitals Group     BP (!) 147/75     Pulse Rate 76     Resp      Temp 98.4 F (36.9 C)     Temp Source Oral     SpO2 99 %     Weight 196 lb (88.9 kg)     Height      Head Circumference      Peak Flow  Pain Score 0     Pain Loc      Pain Edu?      Excl. in [place]?    No data found.  Updated Vital Signs BP (!) 147/75 (BP Location: Right Arm)   Pulse 76   Temp 98.4 F (36.9 C) (Oral)   Wt 196 lb (88.9 kg)   SpO2 99%   BMI 32.62 kg/m   Visual Acuity Right Eye Distance:   Left Eye Distance:   Bilateral Distance:    Right Eye Near:   Left Eye Near:    Bilateral Near:     Physical Exam  Constitutional: She appears well-developed and well-nourished.  Patient is very anxious however she is cooperative.  Eyes:  Dry, macular, plaque-like substances on the outer portion of left eyelid.  Right eyelid slightly puffy with erythema present.  Conjunctiva are non-erythremic no visible irritation noted within inner eye.  Cardiovascular: Normal rate and regular rhythm.  Pulmonary/Chest: Effort normal.  Skin: Skin is warm and dry. Rash noted. Rash is macular and pustular. There is erythema.  Increased warmth, excoriation,  and mild edema on the left lower arm.  Psychiatric: Her mood appears anxious. Her speech is rapid and/or pressured.   UC Treatments / Results  Labs (all labs ordered are listed, but only abnormal results are displayed) Labs Reviewed - No data to display  EKG None  Radiology No results found.  Procedures Procedures (including critical care time)  Medications Ordered in UC Medications  methylPREDNISolone sodium succinate (SOLU-MEDROL) 125 mg/2 mL injection 80 mg (80 mg Intramuscular Given 02/09/18 1602)    Initial Impression / Assessment and Plan / UC Course  I have reviewed the triage vital signs and the nursing notes.  Pertinent labs & imaging results that were available during my care of the patient were reviewed by me and considered in my  medical decision making (see chart for details).    Patient presents today extremely anxious and itchy, with the presence of a rash consistent with a contact dermatitis or eczema localized mostly to the upper bilateral extremities neck, chest, back,  and affecting the eyelids.  This issue has been managed closely by her primary care provider who has since referred her to rheumatology for further evaluation and work-up after she had a positive ANA.  Her primary care has been hesitant to give her another steroidal injection  due to side effects of continuous steroidal treatments.  However patient is in great distress today and has developed a skin infection of the left arm due to excoriation likely secondary to scratching.  Agreed to treat inflammation of the rash with a lower dose of steroids- Solumedrol 80 mg IM. I recommended continued and consistent use of topical nonsteroidal preparations as previously prescribed by her PCP.  Advised to consistently use antihistamines to reduce itching and the urge to scratch.  Also patient has hydroxyzine on hand which I think would be beneficial for her high level of anxiety as well.  For skin infection I am treating with doxycycline today.  Again advised infection it is likely secondary to the repetitive scratching which will only cause further skin complications.  Patient verbalized understanding of instructions and agreed to follow-up with primary care if symptoms worsen or does not improve prior to appointment with rheumatology.  Final Clinical Impressions(s) / UC Diagnoses   Final diagnoses:  Dermatitis due to unknown cause     Discharge Instructions     I have prescribed doxycycline 100 mg twice  daily for 10 days for treatment of your skin infection which I suspect is early cellulitis.  Take all medication as prescribed and take with food to avoid stomach upset.  Avoid scratching skin as this can worsen infection of the skin.  Continue to use topical steroid  oral ointments as prescribed by your PCP.  Keep appointment with rheumatology. I have also prescribed you olopatadine drops for eye itching.  Use as needed twice daily for eye itching.  If symptoms worsen or do not improve please follow-up with your PCP.   ED Prescriptions    Medication Sig Dispense Auth. Provider   olopatadine (PATANOL) 0.1 % ophthalmic solution Place 1 drop into both eyes 2 (two) times daily as needed for allergies. 5 mL Scot Jun, FNP   doxycycline (VIBRAMYCIN) 100 MG capsule Take 1 capsule (100 mg total) by mouth 2 (two) times daily. 20 capsule Scot Jun, FNP     Controlled Substance Prescriptions [place] Controlled Substance Registry consulted? Not Applicable   Scot Jun, [place] 02/11/18 1143

## 2018-03-08 ENCOUNTER — Encounter: Payer: Self-pay | Admitting: Family Medicine

## 2018-03-11 ENCOUNTER — Encounter: Payer: Self-pay | Admitting: Family Medicine

## 2018-03-13 DIAGNOSIS — D8989 Other specified disorders involving the immune mechanism, not elsewhere classified: Secondary | ICD-10-CM | POA: Diagnosis not present

## 2018-03-13 DIAGNOSIS — R21 Rash and other nonspecific skin eruption: Secondary | ICD-10-CM | POA: Diagnosis not present

## 2018-03-13 DIAGNOSIS — L300 Nummular dermatitis: Secondary | ICD-10-CM | POA: Diagnosis not present

## 2018-03-13 DIAGNOSIS — R768 Other specified abnormal immunological findings in serum: Secondary | ICD-10-CM | POA: Diagnosis not present

## 2018-03-14 ENCOUNTER — Encounter: Payer: Self-pay | Admitting: Family Medicine

## 2018-03-14 ENCOUNTER — Encounter: Payer: Self-pay | Admitting: Sports Medicine

## 2018-03-15 ENCOUNTER — Ambulatory Visit: Payer: BLUE CROSS/BLUE SHIELD | Admitting: Family Medicine

## 2018-03-15 ENCOUNTER — Encounter: Payer: Self-pay | Admitting: Family Medicine

## 2018-03-15 VITALS — BP 136/66 | HR 85 | Wt 196.0 lb

## 2018-03-15 DIAGNOSIS — R21 Rash and other nonspecific skin eruption: Secondary | ICD-10-CM

## 2018-03-15 DIAGNOSIS — R768 Other specified abnormal immunological findings in serum: Secondary | ICD-10-CM | POA: Diagnosis not present

## 2018-03-15 DIAGNOSIS — M791 Myalgia, unspecified site: Secondary | ICD-10-CM | POA: Diagnosis not present

## 2018-03-15 DIAGNOSIS — R252 Cramp and spasm: Secondary | ICD-10-CM

## 2018-03-15 DIAGNOSIS — I10 Essential (primary) hypertension: Secondary | ICD-10-CM | POA: Diagnosis not present

## 2018-03-15 DIAGNOSIS — R7989 Other specified abnormal findings of blood chemistry: Secondary | ICD-10-CM | POA: Diagnosis not present

## 2018-03-15 MED ORDER — TRIAMCINOLONE ACETONIDE 0.5 % EX CREA: 1.0000 "application " | TOPICAL_CREAM | Freq: Two times a day (BID) | CUTANEOUS | 3 refills | Status: DC

## 2018-03-15 NOTE — Progress Notes (Signed)
[name] is a 62 y.o. female who presents to [facility] [place] today for follow-up of rash, hypertension, muscle pain and twitching.  [name] has a long history of rash across her trunk and extremities.  She has been referred to dermatology before and was thought to have eczema.  She is been prescribed steroid creams which she uses inconsistently.  She is worried that the rash represents some other fundamental underlying process.  She did have some myalgias and other symptoms and is part of her work-up had a rheumatologic panel obtained and had a positive ANA.  She was seen by rheumatology recently and found to have elevated CK at 600s and a elevated C4 and C3 complement level.  Sjogren's panel still pending however.  She notes the rash is very itchy and bothersome.  She is not currently using any steroid cream on her rash.  Additionally she notes some mild leg soreness and twitching.  Rheumatology ordered a nerve conduction study which she is reluctant to get this.  She is very worried that she has vitamin D deficiency or calcium or magnesium deficiency.  In the past she benefited from calcium and magnesium supplementation but notes the brand she previously was using is no longer available.  She is very worried that her leg cramping and pain and elevated CK is due to mineral deficiency.   ROS as above:  Exam:  BP 136/66   Pulse 85   Wt 196 lb (88.9 kg)   BMI 32.62 kg/m  Gen: Well NAD HEENT: EOMI,  MMM Lungs: Normal work of breathing. CTABL Heart: RRR no MRG Abd: NABS, Soft. Nondistended, Nontender Exts: Brisk capillary refill, warm and well perfused.  Skin: Erythematous maculopapular rash flexural and extensor spaces upper extremities and across trunk.  Some areas of excoriation present.  Lab and Radiology Results Reviewed from [place] 03/13/2018 Care Everywhere Result  Report CKResulted: 03/14/2018 9:36 AM [facility] Component Name Value Ref Range  Total CK 656 (H) 24 - 173 U/L  Specimen Collected on  Blood 03/13/2018 10:48 AM  Result Narrative  Performed at:01 - [facility] [address], [place], [id number] Lab Director: Rush Farmer MD, [id number]  Care Everywhere Result Report C4 ComplementResulted: 03/14/2018 9:36 AM [facility] Component Name Value Ref Range  C4 Complement 55 (H) 14 - 44 mg/dL  Specimen Collected on  Blood 03/13/2018 10:48 AM  Result Narrative  Performed at:01 - [facility] [address] [place], [id number] Lab Director: Rush Farmer MD, [id number]  Care Everywhere Result Report C3 ComplementResulted: 03/14/2018 9:36 AM [facility] Component Name Value Ref Range  Complement C3, Serum 177 (H) 82 - 167 mg/dL  Specimen Collected on  Blood 03/13/2018 10:48 AM  Result Narrative  Performed at:01 - [facility] [address] [place], [id number] Lab Director: Rush Farmer MD, [id number]        Assessment and Plan: 62 y.o. female with  Rash: Very likely eczema as previously thought however patient is in the process of being evaluated for an underlying rheumatologic process.  Full work-up is not yet pending.  In the meantime continue to stress the importance of using steroid cream regularly for her rash.  Additionally we will proceed with work-up for vitamin or mineral deficiency and for elevated CK with vitamin D TSH magnesium CMP and PTH ionized calcium.  Consider nerve conduction study in the future.  Recheck in 1 month.  I spent 25 minutes with this  patient, greater than 50% was face-to-face time counseling regarding ddx, plan, lab results. .    Orders Placed This Encounter  Procedures  . VITAMIN D 25 Hydroxy (Vit-D Deficiency, Fractures)  . TSH  . Magnesium  . PTH, Intact and Calcium  . COMPLETE METABOLIC PANEL WITH GFR     Meds ordered this encounter  Medications  . triamcinolone cream (KENALOG) 0.5 %    Sig: Apply 1 application topically 2 (two) times daily. To affected areas.    Dispense:  60 g    Refill:  3     Historical information moved to improve visibility of documentation.  Past Medical History:  Diagnosis Date  . Anxiety   . Asthma   . Chest pain, atypical   . Closed head injury 06/06/2017  . Heart palpitations    hx of MVP  . IBS (irritable bowel syndrome)    Past Surgical History:  Procedure Laterality Date  . eAB     x2    Social History   Tobacco Use  . Smoking status: Never Smoker  . Smokeless tobacco: Never Used  . Tobacco comment: nonsmoker  Substance Use Topics  . Alcohol use: No   family history includes Heart attack in her father; Lung cancer in her mother.  Medications: Current Outpatient Medications  Medication Sig Dispense Refill  . AMBULATORY NON FORMULARY MEDICATION Freestyle promise meter Use daily as directed 1 each 0  . AMBULATORY NON FORMULARY MEDICATION Freestyle test strips To use with freestyle promise meter  Test blood sugar twice a day 100 each 11  . aspirin 81 MG tablet Take 1 tablet (81 mg total) by mouth daily. 30 tablet   . B Complex-C (B-COMPLEX WITH VITAMIN C) tablet Take 1 tablet by mouth daily.      . betamethasone dipropionate (DIPROLENE) 0.05 % cream   1  . budesonide-formoterol (SYMBICORT) 160-4.5 MCG/ACT inhaler Inhale 2 puffs into the lungs 2 (two) times daily. 1 Inhaler 3  . Calcium-Magnesium-Vitamin D (CALCIUM MAGNESIUM PO) Take by mouth.    . citalopram (CELEXA) 10 MG tablet Take 2 tablets (20 mg total) by mouth daily. 180 tablet 0  . fluocinonide ointment (LIDEX) 8.41 % Apply 1 application topically 2 (two) times daily. 30 g 1  . Garlic Oil 3244 MG CAPS 1 capsule 2 (two) times daily.      . hydrOXYzine (ATARAX/VISTARIL) 25 MG tablet TAKE 1 TABLET (25 MG TOTAL) BY MOUTH 3 (THREE) TIMES DAILY AS NEEDED FOR ANXIETY. 30 tablet 1  .  ketoconazole (NIZORAL) 2 % cream Apply 1 application topically 2 (two) times daily. To affected areas. 60 g 3  . lisinopril (PRINIVIL,ZESTRIL) 10 MG tablet Take 1 tablet (10 mg total) by mouth 2 (two) times daily. 180 tablet 1  . loratadine (CLARITIN) 10 MG tablet Take 1 tablet (10 mg total) by mouth daily. 90 tablet 3  . olopatadine (PATANOL) 0.1 % ophthalmic solution Place 1 drop into both eyes 2 (two) times daily as needed for allergies. 5 mL 12  . Omega-3 Fatty Acids (FISH OIL) 1000 MG CAPS Take 1 capsule by mouth daily.      . vitamin C (ASCORBIC ACID) 500 MG tablet Take 500 mg by mouth daily.      Marland Kitchen albuterol (PROAIR HFA) 108 (90 Base) MCG/ACT inhaler Inhale 2 puffs into the lungs every 6 (six) hours as needed for wheezing. 1 Inhaler 6  . triamcinolone cream (KENALOG) 0.5 % Apply 1 application topically 2 (two) times daily. To  affected areas. 60 g 3   No current facility-administered medications for this visit.    Allergies  Allergen Reactions  . Penicillins Shortness Of Breath    Reaction as a child  . Effexor [Venlafaxine]     "dopey"     Discussed warning signs or symptoms. Please see discharge instructions. Patient expresses understanding.

## 2018-03-15 NOTE — Patient Instructions (Signed)
Thank you for coming in today. Get labs  Use the triamcinolone cream.  Recheck in 1-2 months or sooner if needed.  Let me know when labs come back from rheumatology.

## 2018-03-18 ENCOUNTER — Encounter: Payer: Self-pay | Admitting: Family Medicine

## 2018-03-19 ENCOUNTER — Encounter: Payer: Self-pay | Admitting: Family Medicine

## 2018-03-19 LAB — TSH: TSH: 1.4 m[IU]/L (ref 0.40–4.50)

## 2018-03-19 LAB — MAGNESIUM: MAGNESIUM: 2.1 mg/dL (ref 1.5–2.5)

## 2018-03-19 LAB — COMPLETE METABOLIC PANEL WITH GFR
AG RATIO: 1.5 (calc) (ref 1.0–2.5)
ALT: 14 U/L (ref 6–29)
AST: 30 U/L (ref 10–35)
Albumin: 4.3 g/dL (ref 3.6–5.1)
Alkaline phosphatase (APISO): 104 U/L (ref 33–130)
BUN: 13 mg/dL (ref 7–25)
CO2: 28 mmol/L (ref 20–32)
Calcium: 9.9 mg/dL (ref 8.6–10.4)
Chloride: 103 mmol/L (ref 98–110)
Creat: 0.86 mg/dL (ref 0.50–0.99)
GFR, EST AFRICAN AMERICAN: 83 mL/min/{1.73_m2} (ref >=60)
GFR, Est Non African American: 72 mL/min/{1.73_m2} (ref >=60)
GLOBULIN: 2.9 g/dL (ref 1.9–3.7)
Glucose, Bld: 98 mg/dL (ref 65–99)
POTASSIUM: 4.7 mmol/L (ref 3.5–5.3)
SODIUM: 140 mmol/L (ref 135–146)
Total Bilirubin: 0.3 mg/dL (ref 0.2–1.2)
Total Protein: 7.2 g/dL (ref 6.1–8.1)

## 2018-03-19 LAB — PTH, INTACT AND CALCIUM
Calcium: 9.9 mg/dL (ref 8.6–10.4)
PTH: 24 pg/mL (ref 14–64)

## 2018-03-19 LAB — VITAMIN D 25 HYDROXY (VIT D DEFICIENCY, FRACTURES): Vit D, 25-Hydroxy: 16 ng/mL — ABNORMAL LOW (ref 30–100)

## 2018-03-21 ENCOUNTER — Encounter: Payer: Self-pay | Admitting: Family Medicine

## 2018-03-26 DIAGNOSIS — R748 Abnormal levels of other serum enzymes: Secondary | ICD-10-CM | POA: Diagnosis not present

## 2018-03-26 DIAGNOSIS — R768 Other specified abnormal immunological findings in serum: Secondary | ICD-10-CM | POA: Diagnosis not present

## 2018-03-28 ENCOUNTER — Encounter: Payer: Self-pay | Admitting: Family Medicine

## 2018-03-28 ENCOUNTER — Ambulatory Visit: Payer: BLUE CROSS/BLUE SHIELD | Admitting: Family Medicine

## 2018-03-28 VITALS — BP 139/64 | HR 81 | Wt 198.0 lb

## 2018-03-28 DIAGNOSIS — T148XXA Other injury of unspecified body region, initial encounter: Secondary | ICD-10-CM

## 2018-03-28 DIAGNOSIS — F411 Generalized anxiety disorder: Secondary | ICD-10-CM

## 2018-03-28 DIAGNOSIS — R748 Abnormal levels of other serum enzymes: Secondary | ICD-10-CM | POA: Diagnosis not present

## 2018-03-28 DIAGNOSIS — Z23 Encounter for immunization: Secondary | ICD-10-CM | POA: Diagnosis not present

## 2018-03-28 DIAGNOSIS — L300 Nummular dermatitis: Secondary | ICD-10-CM

## 2018-03-28 NOTE — Progress Notes (Signed)
[name] is a 62 y.o. female who presents to [facility] [place] today for follow-up rash and rheumatology and discuss anxiety.  [name] notes that her rash has significantly improved with regular application of triamcinolone cream.  She feels less itchy and is feeling a lot better about her skin.  Additionally she has been seen by her rheumatologist and had extensive lab work-up.  Detailed lab assessment shows very unlikely that she has lupus.  Her detailed rheumatologic work-up was negative essentially.  Labs will be scanned and abstracted.  She does however continue to have elevated CK level.  She has mild muscle pain however less than previous.  CK was elevated when last checked a few weeks ago.  She notes that she had exercise just before her labs were drawn showing her elevated CK level.  Additionally she notes that her anxiety has worsened a bit recently.  She is having to move from her apartment and in the process of finding a new place to live.  She finds this distressing.  She is optimistic about having a better place to live however.  Her anxiety is manageable currently.  Patient notes some abrasions on her skin from her dog and from gardening.  She thinks that she is probably due for Tdap vaccine. ROS as above:  Exam:  BP 139/64   Pulse 81   Wt 198 lb (89.8 kg)   BMI 32.95 kg/m  Wt Readings from Last 5 Encounters:  03/28/18 198 lb (89.8 kg)  03/15/18 196 lb (88.9 kg)  02/09/18 196 lb (88.9 kg)  12/28/17 186 lb (84.4 kg)  12/20/17 196 lb (88.9 kg)    Gen: Well NAD HEENT: EOMI,  MMM Lungs: Normal work of breathing. CTABL Heart: RRR no MRG Abd: NABS, Soft. Nondistended, Nontender Exts: Brisk capillary refill, warm and well perfused.  Skin: Continued maculopapular rash and skin thickening and mild hyperpigmentation across trunk and extremities however  significantly less than previous exams. Several small abrasions upper lower extremities.  Lab and Radiology Results Labs reviewed from rheumatology will be abstracted and scanned.   Assessment and Plan: 62 y.o. female with  Rash: Likely eczema significantly improved with regular use of triamcinolone cream.  Continue triamcinolone cream and recheck as needed.  Rheumatology: Very likely not lupus.  Detailed lupus panel is negative.  Elevated CK: Difficult to know if this was due to exercise or underlying issue.  Plan to recheck CK next month.  Lab ordered.  Anxiety: Situationally worse.  Watchful waiting recheck as needed.  Abrasion: Need for Tdap.  Tdap given today.  Influenza vaccine declined  Orders Placed This Encounter  Procedures  . Tdap vaccine greater than or equal to 7yo IM  . CK   No orders of the defined types were placed in this encounter.    Historical information moved to improve visibility of documentation.  Past Medical History:  Diagnosis Date  . Anxiety   . Asthma   . Chest pain, atypical   . Closed head injury 06/06/2017  . Heart palpitations    hx of MVP  . IBS (irritable bowel syndrome)    Past Surgical History:  Procedure Laterality Date  . eAB     x2    Social History   Tobacco Use  . Smoking status: Never Smoker  . Smokeless tobacco: Never Used  . Tobacco comment: nonsmoker  Substance Use Topics  . Alcohol use: No   family  history includes Heart attack in her father; Lung cancer in her mother.  Medications: Current Outpatient Medications  Medication Sig Dispense Refill  . AMBULATORY NON FORMULARY MEDICATION Freestyle promise meter Use daily as directed 1 each 0  . AMBULATORY NON FORMULARY MEDICATION Freestyle test strips To use with freestyle promise meter  Test blood sugar twice a day 100 each 11  . aspirin 81 MG tablet Take 1 tablet (81 mg total) by mouth daily. 30 tablet   . B Complex-C (B-COMPLEX WITH VITAMIN C) tablet Take 1  tablet by mouth daily.      . budesonide-formoterol (SYMBICORT) 160-4.5 MCG/ACT inhaler Inhale 2 puffs into the lungs 2 (two) times daily. 1 Inhaler 3  . Calcium-Magnesium-Vitamin D (CALCIUM MAGNESIUM PO) Take by mouth.    . citalopram (CELEXA) 10 MG tablet Take 2 tablets (20 mg total) by mouth daily. 180 tablet 0  . Garlic Oil 7948 MG CAPS 1 capsule 2 (two) times daily.      . hydrOXYzine (ATARAX/VISTARIL) 25 MG tablet TAKE 1 TABLET (25 MG TOTAL) BY MOUTH 3 (THREE) TIMES DAILY AS NEEDED FOR ANXIETY. 30 tablet 1  . ketoconazole (NIZORAL) 2 % cream Apply 1 application topically 2 (two) times daily. To affected areas. 60 g 3  . lisinopril (PRINIVIL,ZESTRIL) 10 MG tablet Take 1 tablet (10 mg total) by mouth 2 (two) times daily. 180 tablet 1  . loratadine (CLARITIN) 10 MG tablet Take 1 tablet (10 mg total) by mouth daily. 90 tablet 3  . olopatadine (PATANOL) 0.1 % ophthalmic solution Place 1 drop into both eyes 2 (two) times daily as needed for allergies. 5 mL 12  . Omega-3 Fatty Acids (FISH OIL) 1000 MG CAPS Take 1 capsule by mouth daily.      Marland Kitchen triamcinolone cream (KENALOG) 0.5 % Apply 1 application topically 2 (two) times daily. To affected areas. 60 g 3  . vitamin C (ASCORBIC ACID) 500 MG tablet Take 500 mg by mouth daily.      Marland Kitchen albuterol (PROAIR HFA) 108 (90 Base) MCG/ACT inhaler Inhale 2 puffs into the lungs every 6 (six) hours as needed for wheezing. 1 Inhaler 6   No current facility-administered medications for this visit.    Allergies  Allergen Reactions  . Penicillins Shortness Of Breath    Reaction as a child  . Effexor [Venlafaxine]     "dopey"     Discussed warning signs or symptoms. Please see discharge instructions. Patient expresses understanding.

## 2018-03-28 NOTE — Patient Instructions (Addendum)
Thank you for coming in today. Recheck CK lab in mid September.  Continue current healthy lifestyle.  Keep using the cream.

## 2018-04-01 ENCOUNTER — Encounter: Payer: Self-pay | Admitting: Family Medicine

## 2018-04-05 ENCOUNTER — Encounter: Payer: Self-pay | Admitting: Family Medicine

## 2018-04-08 ENCOUNTER — Encounter: Payer: Self-pay | Admitting: Family Medicine

## 2018-04-08 LAB — CK, TOTAL(REFL)
C3 COMPLEMENT: 177
C4 Complement: 55
CK TOTAL: 656

## 2018-04-08 LAB — ANTI-DSDNA ANTIBODIES: Anti-dsDNA: 94

## 2018-04-11 ENCOUNTER — Encounter: Payer: Self-pay | Admitting: Family Medicine

## 2018-04-11 ENCOUNTER — Ambulatory Visit: Payer: BLUE CROSS/BLUE SHIELD | Admitting: Family Medicine

## 2018-04-11 VITALS — BP 138/56 | HR 88 | Wt 195.0 lb

## 2018-04-11 DIAGNOSIS — R748 Abnormal levels of other serum enzymes: Secondary | ICD-10-CM

## 2018-04-11 DIAGNOSIS — R002 Palpitations: Secondary | ICD-10-CM | POA: Diagnosis not present

## 2018-04-11 MED ORDER — CITALOPRAM HYDROBROMIDE 10 MG PO TABS: 20.0000 mg | ORAL_TABLET | Freq: Every day | ORAL | 0 refills | Status: DC

## 2018-04-11 NOTE — Progress Notes (Signed)
[name] is a 62 y.o. female who presents to [facility] today for palpitations.  Linnette developed a few seconds of rapid palpitations yesterday.  She is been under a lot of stress recently and is in the process of moving.  She had just got back from chasing her dog when she felt about 5 seconds of rapid heartbeat.  She denies feeling lightheadedness or dizziness or chest pain.  She notes that she occasionally will have a sensation of skipped beats but has not typically had symptoms like this in the past.  She notes that she had a heart work-up in February 2018 including a normal stress echocardiogram.  Additionally patient had elevated CK as part of a work-up at rheumatology.  She denies significant muscle aches and pains and is due for recheck today. ROS as above:  Exam:  BP (!) 138/56   Pulse 88   Wt 195 lb (88.5 kg)   BMI 32.45 kg/m  Wt Readings from Last 5 Encounters:  04/11/18 195 lb (88.5 kg)  03/28/18 198 lb (89.8 kg)  03/15/18 196 lb (88.9 kg)  02/09/18 196 lb (88.9 kg)  12/28/17 186 lb (84.4 kg)    Gen: Well NAD HEENT: EOMI,  MMM Lungs: Normal work of breathing. CTABL Heart: RRR no MRG Abd: NABS, Soft. Nondistended, Nontender Exts: Brisk capillary refill, warm and well perfused.   Lab and Radiology Results Twelve-lead EKG shows normal sinus rhythm.  No ST segment elevation or depression.  Possible left atrial enlargement.  EKG not significantly changed from prior study    Chemistry      Component Value Date/Time   NA 140 03/15/2018 1047   K 4.7 03/15/2018 1047   CL 103 03/15/2018 1047   CO2 28 03/15/2018 1047   BUN 13 03/15/2018 1047   CREATININE 0.86 03/15/2018 1047      Component Value Date/Time   CALCIUM 9.9 03/15/2018 1047   CALCIUM 9.9 03/15/2018 1047   ALKPHOS 92 08/29/2016 0812   AST 30 03/15/2018 1047   ALT 14 03/15/2018 1047     BILITOT 0.3 03/15/2018 1047        Assessment and Plan: 62 y.o. female with  Palpitations.  Unclear etiology however patient's current exam and EKG are reassuring.  She did not have syncope or chest pain or near syncope during the palpitation event.  Plan for watchful waiting and recheck in the near future.  Will order Holter monitor if needed if symptoms recur.  Patient had a reassuring stress test in February 2018 and recently had normal electrolyte labs..  Elevated CK: Plan to recheck CK today.   Orders Placed This Encounter  Procedures  . EKG 12-Lead   Meds ordered this encounter  Medications  . citalopram (CELEXA) 10 MG tablet    Sig: Take 2 tablets (20 mg total) by mouth daily.    Dispense:  180 tablet    Refill:  0     Historical information moved to improve visibility of documentation.  Past Medical History:  Diagnosis Date  . Anxiety   . Asthma   . Chest pain, atypical   . Closed head injury 06/06/2017  . Heart palpitations    hx of MVP  . IBS (irritable bowel syndrome)    Past Surgical History:  Procedure Laterality Date  . eAB     x2    Social History   Tobacco Use  . Smoking status: Never  Smoker  . Smokeless tobacco: Never Used  . Tobacco comment: nonsmoker  Substance Use Topics  . Alcohol use: No   family history includes Heart attack in her father; Lung cancer in her mother.  Medications: Current Outpatient Medications  Medication Sig Dispense Refill  . AMBULATORY NON FORMULARY MEDICATION Freestyle promise meter Use daily as directed 1 each 0  . AMBULATORY NON FORMULARY MEDICATION Freestyle test strips To use with freestyle promise meter  Test blood sugar twice a day 100 each 11  . aspirin 81 MG tablet Take 1 tablet (81 mg total) by mouth daily. 30 tablet   . B Complex-C (B-COMPLEX WITH VITAMIN C) tablet Take 1 tablet by mouth daily.      . budesonide-formoterol (SYMBICORT) 160-4.5 MCG/ACT inhaler Inhale 2 puffs into the lungs 2 (two)  times daily. 1 Inhaler 3  . Calcium-Magnesium-Vitamin D (CALCIUM MAGNESIUM PO) Take by mouth.    . citalopram (CELEXA) 10 MG tablet Take 2 tablets (20 mg total) by mouth daily. 180 tablet 0  . Garlic Oil 1007 MG CAPS 1 capsule 2 (two) times daily.      . hydrOXYzine (ATARAX/VISTARIL) 25 MG tablet TAKE 1 TABLET (25 MG TOTAL) BY MOUTH 3 (THREE) TIMES DAILY AS NEEDED FOR ANXIETY. 30 tablet 1  . ketoconazole (NIZORAL) 2 % cream Apply 1 application topically 2 (two) times daily. To affected areas. 60 g 3  . lisinopril (PRINIVIL,ZESTRIL) 10 MG tablet Take 1 tablet (10 mg total) by mouth 2 (two) times daily. 180 tablet 1  . loratadine (CLARITIN) 10 MG tablet Take 1 tablet (10 mg total) by mouth daily. 90 tablet 3  . olopatadine (PATANOL) 0.1 % ophthalmic solution Place 1 drop into both eyes 2 (two) times daily as needed for allergies. 5 mL 12  . Omega-3 Fatty Acids (FISH OIL) 1000 MG CAPS Take 1 capsule by mouth daily.      Marland Kitchen triamcinolone cream (KENALOG) 0.5 % Apply 1 application topically 2 (two) times daily. To affected areas. 60 g 3  . vitamin C (ASCORBIC ACID) 500 MG tablet Take 500 mg by mouth daily.      Marland Kitchen albuterol (PROAIR HFA) 108 (90 Base) MCG/ACT inhaler Inhale 2 puffs into the lungs every 6 (six) hours as needed for wheezing. 1 Inhaler 6   No current facility-administered medications for this visit.    Allergies  Allergen Reactions  . Penicillins Shortness Of Breath    Reaction as a child  . Effexor [Venlafaxine]     "dopey"     Discussed warning signs or symptoms. Please see discharge instructions. Patient expresses understanding.

## 2018-04-11 NOTE — Patient Instructions (Signed)
Thank you for coming in today. Recheck CK today.  Let me know if this happens again.  Next step is a holter monitor.     Palpitations A palpitation is the feeling that your heartbeat is irregular or is faster than normal. It may feel like your heart is fluttering or skipping a beat. Palpitations are usually not a serious problem. They may be caused by many things, including smoking, caffeine, alcohol, stress, and certain medicines. Although most causes of palpitations are not serious, palpitations can be a sign of a serious medical problem. In some cases, you may need further medical evaluation. Follow these instructions at home: Pay attention to any changes in your symptoms. Take these actions to help with your condition:  Avoid the following: ? Caffeinated coffee, tea, soft drinks, diet pills, and energy drinks. ? Chocolate. ? Alcohol.  Do not use any tobacco products, such as cigarettes, chewing tobacco, and e-cigarettes. If you need help quitting, ask your health care provider.  Try to reduce your stress and anxiety. Things that can help you relax include: ? Yoga. ? Meditation. ? Physical activity, such as swimming, jogging, or walking. ? Biofeedback. This is a method that helps you learn to use your mind to control things in your body, such as your heartbeats.  Get plenty of rest and sleep.  Take over-the-counter and prescription medicines only as told by your health care provider.  Keep all follow-up visits as told by your health care provider. This is important.  Contact a health care provider if:  You continue to have a fast or irregular heartbeat after 24 hours.  Your palpitations occur more often. Get help right away if:  You have chest pain or shortness of breath.  You have a severe headache.  You feel dizzy or you faint. This information is not intended to replace advice given to you by your health care provider. Make sure you discuss any questions you have with  your health care provider. Document Released: 07/14/2000 Document Revised: 12/20/2015 Document Reviewed: 04/01/2015 Elsevier Interactive Patient Education  2018 [place] What is domestic violence? Domestic violence, also called intimate partner violence, can involve physical, emotional, psychological, sexual, and economic abuse by a current or former intimate partner. Stalking is also considered a type of domestic violence. Domestic violence can happen between people who are or were:  Married.  Dating.  Living together.  Abusers repeatedly act to maintain control and power over their partner. Physical abuse Physical abuse can include:  Slapping.  Hitting.  Kicking.  Punching.  Choking.  Pulling the victim's hair.  Damaging the victim's property.  Threatening or hurting the victim with weapons.  Trapping the victim in his or her home.  Forcing the victim to use drugs or alcohol.  Emotional and psychological abuse Emotional and psychological abuse can include:  Threats.  Insults.  Isolation.  Humiliation.  Jealousy and possessiveness.  Blame.  Withholding affection.  Intimidation.  Manipulation.  Limiting contact with friends and family.  Sexual abuse Sexual abuse can include:  Forcing sex.  Forcing sexual touching.  Hurting the victim during sex.  Forcing the victim to have sex with other people.  Giving the victim a sexually transmitted disease (STD) on purpose.  Economic abuse Economic abuse can include:  Controlling resources, such as money, food, transportation, a phone, or computer.  Stealing money from the victim or his or her family or friends.  Forbidding the victim to work.  Refusing to  work or to contribute to the household.  Stalking Stalking can include:  Making repeated, unwanted phone calls, emails, or text messages.  Leaving cards, letters, flowers or other items the victim  does not want.  Watching or following the victim from a distance.  Going to places where the victim does not want the abuser.  Entering the victim's home or car.  Damaging the victim's personal property.  What are some warning signs of domestic violence? Physical signs  Bruises.  Broken bones.  Burns or cuts.  Physical pain.  Head injury. Emotional and psychological signs  Crying.  Depression.  Hopelessness.  Desperation.  Trouble sleeping.  Fear of partner.  Anxiety.  Suicidal behavior.  Antisocial behavior.  Low self-esteem.  Fear of intimacy.  Flashbacks. Sexual signs  Bruising, swelling, or bleeding of the genital or rectal area.  Signs of a sexually transmitted infection, such as genital sores, warts, or discharge coming from the genital area.  Pain in the genital area.  Unintended pregnancy.  Problems with pregnancy, including delayed prenatal care and prematurity. Economic signs  Having little money or food.  Homelessness.  Asking for or borrowing money. What are common behaviors of those affected by domestic violence? Those affected by domestic violence may:  Be late to work or other events.  Not show up to places as promised.  Have to let their partner know where they are and who they are with.  Be isolated or kept from seeing friends or family.  Make comments about their partner's temper or behavior.  Make excuses for their partner.  Engage in high-risk sexual behaviors.  Use drugs or alcohol.  Have unhealthy diet-related behaviors.  What are common feelings of those affected by domestic violence? Those affected by domestic violence may feel that:  They have to be careful not to say or do things that trigger their partner's anger.  They cannot do anything right.  They deserve to be treated badly.  They are overreacting to their partner's behavior or temper.  They cannot trust their own feelings.  They cannot  trust other people.  They are trapped.  Their partner would take away their children.  They are emotionally drained or numb.  Their life is in danger.  They might have to kill their partner to survive.  Where can you get help? Domestic violence hotlines and websites If you do not feel safe searching for help online at home, use a computer at [facility] to access [employer]. Call 911 if you are in immediate danger or need medical help.  The [employer]. ? The 24-hour phone hotline is 754-614-3381 or 430-155-2295 (TTY). ? The videophone is available Monday through Friday, 9 a.m. to 5 p.m. Call (239)126-4281. ? The website is http://thehotline.org  The National Sexual Assault Hotline. ? The 24-hour phone hotline is (571)875-5386. ? You can access the online hotline at http://www.dixon.info/  Shelters for victims of domestic violence If you are a victim of domestic violence, there are resources to help you find a temporary place for you and your children to live (shelter). The specific address of these shelters is often not known to the public. Police Report assaults, threats, and stalking to the police. Counselors and counseling centers People who have been victims of domestic violence can benefit from counseling. Counseling can help you cope with difficult emotions and empower you to plan for your future safety. The topics you discuss with a counselor are private and confidential. Children of  domestic violence victims also might need counseling to manage stress and anxiety. The court system You can work with a Chief Executive Officer or an advocate to get legal protection against an abuser. Protection includes restraining orders and private addresses. Crimes against you, such as assault, can also be prosecuted through the courts. Laws vary by state. This information is not intended to replace advice given to you by your health care provider. Make sure you  discuss any questions you have with your health care provider. Document Released: 10/07/2003 Document Revised: 06/30/2016 Document Reviewed: 04/03/2014 Elsevier Interactive Patient Education  2018 [employer].

## 2018-04-12 ENCOUNTER — Telehealth: Payer: Self-pay | Admitting: Cardiovascular Disease

## 2018-04-12 ENCOUNTER — Encounter: Payer: Self-pay | Admitting: Family Medicine

## 2018-04-12 LAB — CK: Total CK: 213 U/L — ABNORMAL HIGH (ref 29–143)

## 2018-04-12 NOTE — Telephone Encounter (Signed)
Called patient back to let her know Dr. Johnsie Cancel is out of the office this week and we will get back to her about her EKG next week. Will forward to Dr. Johnsie Cancel for advisement.

## 2018-04-12 NOTE — Telephone Encounter (Signed)
New message   Patient had an EKG on 04/11/18 thru [facility] and the patient would like for Dr. Johnsie Cancel to review EKG and call to discuss.

## 2018-04-14 NOTE — Telephone Encounter (Signed)
ECG has not been scanned in system so I cannot review

## 2018-04-15 ENCOUNTER — Ambulatory Visit: Payer: BLUE CROSS/BLUE SHIELD | Admitting: Physician Assistant

## 2018-04-15 ENCOUNTER — Encounter: Payer: Self-pay | Admitting: Family Medicine

## 2018-04-15 ENCOUNTER — Encounter: Payer: Self-pay | Admitting: Physician Assistant

## 2018-04-15 VITALS — BP 140/76 | HR 85 | Wt 195.0 lb

## 2018-04-15 DIAGNOSIS — I10 Essential (primary) hypertension: Secondary | ICD-10-CM

## 2018-04-15 DIAGNOSIS — E559 Vitamin D deficiency, unspecified: Secondary | ICD-10-CM | POA: Insufficient documentation

## 2018-04-15 DIAGNOSIS — R748 Abnormal levels of other serum enzymes: Secondary | ICD-10-CM | POA: Diagnosis not present

## 2018-04-15 DIAGNOSIS — F411 Generalized anxiety disorder: Secondary | ICD-10-CM | POA: Diagnosis not present

## 2018-04-15 DIAGNOSIS — F430 Acute stress reaction: Secondary | ICD-10-CM | POA: Diagnosis not present

## 2018-04-15 DIAGNOSIS — R768 Other specified abnormal immunological findings in serum: Secondary | ICD-10-CM | POA: Insufficient documentation

## 2018-04-15 NOTE — Progress Notes (Signed)
HPI:                                                                [name] is a 62 y.o. female who presents to [facility] today for elevated blood pressure  Patient with PMH hypertension, palpitations, prediabetes, HLD, obesity, asthma, elevated CK, positive ANA, anxiety, and vitamin D deficiency presents with elevated home BP readings. Associated with skin flushing and cold hands. No chest pain, dyspnea, lightheadedness or orthopnea. Systolic BP was 384 this morning. She took a second dose of Lisinopril 10 mg and BP came down to 139. She admits to checking her BP multiple times per day and being hypervigilant about fluctuations in her blood pressure.  She endorses increased anxiety and she is under a lot of stress. She is in the process of moving out of her home of 18 years. She has not been sleeping well and reports only getting a few hours of sleep last night. States she had a strange dream last night in which she was at her doctor's office and the doctor was pushing the stethoscope against her chest. This concerned her because she thought perhaps she was having chest pain in her dream.  She is prescribed Hydroxyzine for anxiety/insomnia, but has not been taking this recently.  Prior work-up: ECG 04/11/18 normal sinus rhythm Stress Echo 09/11/16: LVEF 65-70%, normal wall motion, no evidence of ischemia     Past Medical History:  Diagnosis Date  . Anxiety   . Asthma   . Chest pain, atypical   . Closed head injury 06/06/2017  . Heart palpitations    hx of MVP  . IBS (irritable bowel syndrome)    Past Surgical History:  Procedure Laterality Date  . eAB     x2    Social History   Tobacco Use  . Smoking status: Never Smoker  . Smokeless tobacco: Never Used  . Tobacco comment: nonsmoker  Substance Use Topics  . Alcohol use: No   family history includes Heart attack in her father; Lung cancer in her mother.    ROS:  negative except as noted in the HPI  Medications: Current Outpatient Medications  Medication Sig Dispense Refill  . albuterol (PROAIR HFA) 108 (90 Base) MCG/ACT inhaler Inhale 2 puffs into the lungs every 6 (six) hours as needed for wheezing. 1 Inhaler 6  . AMBULATORY NON FORMULARY MEDICATION Freestyle promise meter Use daily as directed 1 each 0  . AMBULATORY NON FORMULARY MEDICATION Freestyle test strips To use with freestyle promise meter  Test blood sugar twice a day 100 each 11  . aspirin 81 MG tablet Take 1 tablet (81 mg total) by mouth daily. 30 tablet   . B Complex-C (B-COMPLEX WITH VITAMIN C) tablet Take 1 tablet by mouth daily.      . budesonide-formoterol (SYMBICORT) 160-4.5 MCG/ACT inhaler Inhale 2 puffs into the lungs 2 (two) times daily. 1 Inhaler 3  . Calcium-Magnesium-Vitamin D (CALCIUM MAGNESIUM PO) Take by mouth.    . citalopram (CELEXA) 10 MG tablet Take 2 tablets (20 mg total) by mouth daily. 180 tablet 0  . Garlic Oil 6659 MG CAPS 1 capsule 2 (two) times daily.      . hydrOXYzine (ATARAX/VISTARIL) 25  MG tablet TAKE 1 TABLET (25 MG TOTAL) BY MOUTH 3 (THREE) TIMES DAILY AS NEEDED FOR ANXIETY. 30 tablet 1  . ketoconazole (NIZORAL) 2 % cream Apply 1 application topically 2 (two) times daily. To affected areas. 60 g 3  . lisinopril (PRINIVIL,ZESTRIL) 10 MG tablet Take 1 tablet (10 mg total) by mouth 2 (two) times daily. 180 tablet 1  . loratadine (CLARITIN) 10 MG tablet Take 1 tablet (10 mg total) by mouth daily. 90 tablet 3  . olopatadine (PATANOL) 0.1 % ophthalmic solution Place 1 drop into both eyes 2 (two) times daily as needed for allergies. 5 mL 12  . Omega-3 Fatty Acids (FISH OIL) 1000 MG CAPS Take 1 capsule by mouth daily.      Marland Kitchen triamcinolone cream (KENALOG) 0.5 % Apply 1 application topically 2 (two) times daily. To affected areas. 60 g 3  . vitamin C (ASCORBIC ACID) 500 MG tablet Take 500 mg by mouth daily.       No current facility-administered medications for  this visit.    Allergies  Allergen Reactions  . Penicillins Shortness Of Breath    Reaction as a child  . Effexor [Venlafaxine] Other (See Comments)    "dopey"       Objective:  BP 140/76   Pulse 85   Wt 195 lb (88.5 kg)   BMI 32.45 kg/m  Gen:  alert, not ill-appearing, no distress, appropriate for age 61: head normocephalic without obvious abnormality, conjunctiva and cornea clear, wearing glasses, trachea midline Pulm: Normal work of breathing, normal phonation, clear to auscultation bilaterally, no wheezes, rales or rhonchi CV: Normal rate, regular rhythm, s1 and s2 distinct, no murmurs, clicks or rubs; radial pulses 2+ symmetric Neuro: alert and oriented x 3, no tremor MSK: extremities atraumatic, normal gait and station, no peripheral edema Skin: intact, no rashes on exposed skin, no jaundice, no cyanosis Psych: well-groomed, cooperative, good eye contact, anxious mood, affect full range, speech is articulate, and thought processes clear and goal-directed  Lab Results  Component Value Date   CREATININE 0.86 03/15/2018   BUN 13 03/15/2018   NA 140 03/15/2018   K 4.7 03/15/2018   CL 103 03/15/2018   CO2 28 03/15/2018     No results found for this or any previous visit (from the past 72 hour(s)). No results found.    Assessment and Plan: 62 y.o. female with   .[name] was seen today for hypertension.  Diagnoses and all orders for this visit:  Stress reaction  Essential hypertension  Elevated CK  Generalized anxiety disorder   - BP in range in office today. Continue Lisinopril 10 mg - no chest pain, claudication, orthopnea, PND, lightheadedness, or syncope. Low suspicion that this is cardiac related. Discussed it is normal for BP to fluctuate based on time of day, activity level, and emotional state - we discussed stress management. She has coping skills including mindfulness and walking her dog. She does not wish to increase her Celexa at this time.  She will re-start Hydroxyzine 12.5 mg QHS prn    Patient education and anticipatory guidance given Patient agrees with treatment plan Follow-up with PCP in 1 month for anxiety or sooner as needed if symptoms worsen or fail to improve  Darlyne Russian PA-C

## 2018-04-15 NOTE — Patient Instructions (Signed)
Stress and Stress Management Stress is a normal reaction to life events. It is what you feel when life demands more than you are used to or more than you can handle. Some stress can be useful. For example, the stress reaction can help you catch the last bus of the day, study for a test, or meet a deadline at work. But stress that occurs too often or for too long can cause problems. It can affect your emotional health and interfere with relationships and normal daily activities. Too much stress can weaken your immune system and increase your risk for physical illness. If you already have a medical problem, stress can make it worse. What are the causes? All sorts of life events may cause stress. An event that causes stress for one person may not be stressful for another person. Major life events commonly cause stress. These may be positive or negative. Examples include losing your job, moving into a new home, getting married, having a baby, or losing a loved one. Less obvious life events may also cause stress, especially if they occur day after day or in combination. Examples include working long hours, driving in traffic, caring for children, being in debt, or being in a difficult relationship. What are the signs or symptoms? Stress may cause emotional symptoms including, the following:  Anxiety. This is feeling worried, afraid, on edge, overwhelmed, or out of control.  Anger. This is feeling irritated or impatient.  Depression. This is feeling sad, down, helpless, or guilty.  Difficulty focusing, remembering, or making decisions.  Stress may cause physical symptoms, including the following:  Aches and pains. These may affect your head, neck, back, stomach, or other areas of your body.  Tight muscles or clenched jaw.  Low energy or trouble sleeping.  Stress may cause unhealthy behaviors, including the following:  Eating to feel better (overeating) or skipping meals.  Sleeping too little,  too much, or both.  Working too much or putting off tasks (procrastination).  Smoking, drinking alcohol, or using drugs to feel better.  How is this diagnosed? Stress is diagnosed through an assessment by your health care provider. Your health care provider will ask questions about your symptoms and any stressful life events.Your health care provider will also ask about your medical history and may order blood tests or other tests. Certain medical conditions and medicine can cause physical symptoms similar to stress. Mental illness can cause emotional symptoms and unhealthy behaviors similar to stress. Your health care provider may refer you to a mental health professional for further evaluation. How is this treated? Stress management is the recommended treatment for stress.The goals of stress management are reducing stressful life events and coping with stress in healthy ways. Techniques for reducing stressful life events include the following:  Stress identification. Self-monitor for stress and identify what causes stress for you. These skills may help you to avoid some stressful events.  Time management. Set your priorities, keep a calendar of events, and learn to say "no." These tools can help you avoid making too many commitments.  Techniques for coping with stress include the following:  Rethinking the problem. Try to think realistically about stressful events rather than ignoring them or overreacting. Try to find the positives in a stressful situation rather than focusing on the negatives.  Exercise. Physical exercise can release both physical and emotional tension. The key is to find a form of exercise you enjoy and do it regularly.  Relaxation techniques. These relax the body and  mind. Examples include yoga, meditation, tai chi, biofeedback, deep breathing, progressive muscle relaxation, listening to music, being out in nature, journaling, and other hobbies. Again, the key is to find  one or more that you enjoy and can do regularly.  Healthy lifestyle. Eat a balanced diet, get plenty of sleep, and do not smoke. Avoid using alcohol or drugs to relax.  Strong support network. Spend time with family, friends, or other people you enjoy being around.Express your feelings and talk things over with someone you trust.  Counseling or talktherapy with a mental health professional may be helpful if you are having difficulty managing stress on your own. Medicine is typically not recommended for the treatment of stress.Talk to your health care provider if you think you need medicine for symptoms of stress. Follow these instructions at home:  Keep all follow-up visits as directed by your health care provider.  Take all medicines as directed by your health care provider. Contact a health care provider if:  Your symptoms get worse or you start having new symptoms.  You feel overwhelmed by your problems and can no longer manage them on your own. Get help right away if:  You feel like hurting yourself or someone else. This information is not intended to replace advice given to you by your health care provider. Make sure you discuss any questions you have with your health care provider. Document Released: 01/10/2001 Document Revised: 12/23/2015 Document Reviewed: 03/11/2013 Elsevier Interactive Patient Education  2017 [place] Stress Reduction Mindfulness-based stress reduction (MBSR) is a program that helps people learn to practice mindfulness. Mindfulness is the practice of intentionally paying attention to the present moment. It can be learned and practiced through techniques such as education, breathing exercises, meditation, and yoga. MBSR includes several mindfulness techniques in one program. MBSR works best when you understand the treatment, are willing to try new things, and can commit to spending time practicing what you learn. MBSR training may  include learning about:  How your emotions, thoughts, and reactions affect your body.  New ways to respond to things that cause negative thoughts to start (triggers).  How to notice your thoughts and let go of them.  Practicing awareness of everyday things that you normally do without thinking.  The techniques and goals of different types of meditation.  What are the benefits of MBSR? MBSR can have many benefits, which include helping you to:  Develop self-awareness. This refers to knowing and understanding yourself.  Learn skills and attitudes that help you to participate in your own health care.  Learn new ways to care for yourself.  Be more accepting about how things are, and let things go.  Be less judgmental and approach things with an open mind.  Be patient with yourself and trust yourself more.  MBSR has also been shown to:  Reduce negative emotions, such as depression and anxiety.  Improve memory and focus.  Change how you sense and approach pain.  Boost your body's ability to fight infections.  Help you connect better with other people.  Improve your sense of well-being.  Follow these instructions at home:  Find a local in-person or online MBSR program.  Set aside some time regularly for mindfulness practice.  Find a mindfulness practice that works best for you. This may include one or more of the following: ? Meditation. Meditation involves focusing your mind on a certain thought or activity. ? Breathing awareness exercises. These help you to stay present by  focusing on your breath. ? Body scan. For this practice, you lie down and pay attention to each part of your body from head to toe. You can identify tension and soreness and intentionally relax parts of your body. ? Yoga. Yoga involves stretching and breathing, and it can improve your ability to move and be flexible. It can also provide an experience of testing your body's limits, which can help you  release stress. ? Mindful eating. This way of eating involves focusing on the taste, texture, color, and smell of each bite of food. Because this slows down eating and helps you feel full sooner, it can be an important part of a weight-loss plan.  Find a podcast or recording that provides guidance for breathing awareness, body scan, or meditation exercises. You can listen to these any time when you have a free moment to rest without distractions.  Follow your treatment plan as told by your health care provider. This may include taking regular medicines and making changes to your diet or lifestyle as recommended. How to practice mindfulness To do a basic awareness exercise:  Find a comfortable place to sit.  Pay attention to the present moment. Observe your thoughts, feelings, and surroundings just as they are.  Avoid placing judgment on yourself, your feelings, or your surroundings. Make note of any judgment that comes up, and let it go.  Your mind may wander, and that is okay. Make note of when your thoughts drift, and return your attention to the present moment.  To do basic mindfulness meditation:  Find a comfortable place to sit. This may include a stable chair or a firm floor cushion. ? Sit upright with your back straight. Let your arms fall next to your side with your hands resting on your legs. ? If sitting in a chair, rest your feet flat on the floor. ? If sitting on a cushion, cross your legs in front of you.  Keep your head in a neutral position with your chin dropped slightly. Relax your jaw and rest the tip of your tongue on the roof of your mouth. Drop your gaze to the floor. You can close your eyes if you like.  Breathe normally and pay attention to your breath. Feel the air moving in and out of your nose. Feel your belly expanding and relaxing with each breath.  Your mind may wander, and that is okay. Make note of when your thoughts drift, and return your attention to your  breath.  Avoid placing judgment on yourself, your feelings, or your surroundings. Make note of any judgment or feelings that come up, let them go, and bring your attention back to your breath.  When you are ready, lift your gaze or open your eyes. Pay attention to how your body feels after the meditation.  Where to find more information: You can find more information about MBSR from:  Your health care provider.  Community-based meditation centers or programs.  Programs offered near you.  Summary  Mindfulness-based stress reduction (MBSR) is a program that teaches you how to intentionally pay attention to the present moment. It is used with other treatments to help you cope better with daily stress, emotions, and pain.  MBSR focuses on developing self-awareness, which allows you to respond to life stress without judgment or negative emotions.  MBSR programs may involve learning different mindfulness practices, such as breathing exercises, meditation, yoga, body scan, or mindful eating. Find a mindfulness practice that works best for you, and set  aside time for it on a regular basis. This information is not intended to replace advice given to you by your health care provider. Make sure you discuss any questions you have with your health care provider. Document Released: 11/23/2016 Document Revised: 11/23/2016 Document Reviewed: 11/23/2016 Elsevier Interactive Patient Education  [employer].

## 2018-04-15 NOTE — Telephone Encounter (Signed)
Called patient to le her know Dr. Johnsie Cancel is until to review EKG at this time, due to it not being on her chart. Patient will see if doctor's office can fax over EKG. Provided fax number.

## 2018-04-18 ENCOUNTER — Telehealth: Payer: Self-pay | Admitting: Cardiovascular Disease

## 2018-04-18 NOTE — Telephone Encounter (Signed)
Called patient back to let her know Dr. Johnsie Cancel looked at her EKG, and it's okay and no different than one done in 2018. Patient verbalized understanding.

## 2018-04-18 NOTE — Telephone Encounter (Signed)
Please see phone note from 04/12/18.

## 2018-04-18 NOTE — Telephone Encounter (Signed)
ECG looks ok and no different than one done in 2018

## 2018-04-18 NOTE — Telephone Encounter (Signed)
New Message:    FYI:  Pt is calling to let Dr. Johnsie Cancel know that her EKG has now been scanned it into Epic from her PCP.

## 2018-04-29 ENCOUNTER — Telehealth: Payer: Self-pay

## 2018-04-29 NOTE — Telephone Encounter (Signed)
[name] complains of burning with urination x 1 day. Denies fever, chills or sweats. Patient is unable to come in for a visit. Please advise.

## 2018-04-29 NOTE — Telephone Encounter (Signed)
Patient has been advised of the recommendations. Kaelan Amble,CMA

## 2018-04-29 NOTE — Telephone Encounter (Signed)
Try OTC azo. If not improving I will prescribe antibiotics tomorrow.

## 2018-04-29 NOTE — Telephone Encounter (Signed)
Pt wanted to make you aware that she has been taking hydroxezine and still having a burning urine.. She did stop taking it a few days ago. She doesn't have access to [facility] right now.  She is in the middle of a major move, didn't really need to hear back, but she still feels that the medication is causing the burning urine.

## 2018-04-30 ENCOUNTER — Ambulatory Visit (INDEPENDENT_AMBULATORY_CARE_PROVIDER_SITE_OTHER): Payer: BLUE CROSS/BLUE SHIELD

## 2018-04-30 ENCOUNTER — Ambulatory Visit: Payer: BLUE CROSS/BLUE SHIELD | Admitting: Sports Medicine

## 2018-04-30 ENCOUNTER — Encounter: Payer: Self-pay | Admitting: Sports Medicine

## 2018-04-30 DIAGNOSIS — R300 Dysuria: Secondary | ICD-10-CM | POA: Diagnosis not present

## 2018-04-30 DIAGNOSIS — R319 Hematuria, unspecified: Secondary | ICD-10-CM | POA: Diagnosis not present

## 2018-04-30 DIAGNOSIS — K5900 Constipation, unspecified: Secondary | ICD-10-CM

## 2018-04-30 LAB — POCT URINALYSIS DIPSTICK
Glucose, UA: NEGATIVE
Leukocytes, UA: NEGATIVE
Nitrite, UA: NEGATIVE
Protein, UA: POSITIVE — AB
Spec Grav, UA: >=1.03 — AB (ref 1.010–1.025)
Urobilinogen, UA: 1 E.U./dL
pH, UA: 5.5 (ref 5.0–8.0)

## 2018-04-30 MED ORDER — CEPHALEXIN 500 MG PO CAPS: 500.0000 mg | ORAL_CAPSULE | Freq: Two times a day (BID) | ORAL | 0 refills | Status: DC

## 2018-04-30 MED ORDER — PHENAZOPYRIDINE HCL 200 MG PO TABS: 200.0000 mg | ORAL_TABLET | Freq: Three times a day (TID) | ORAL | 0 refills | Status: AC

## 2018-04-30 NOTE — Assessment & Plan Note (Addendum)
Likely uncomplicated cystitis. Hematuria without pyuria, adding abdominal x-ray to ensure no nephrolith. High specific gravity indicating dehydration, and thus concentrated urine can also cause dysuria. Adding Keflex, educated on wiping front to back and avoiding after sex. Awaiting urine culture, also adding Pyridium.

## 2018-04-30 NOTE — Patient Instructions (Signed)

## 2018-04-30 NOTE — Progress Notes (Signed)
Subjective:    CC: Dysuria  HPI: For the past few days this pleasant 62 year old female has had urinary urgency, frequency, burning.  No constitutional symptoms, minimal low back pain on both sides.  No trauma.  Symptoms are moderate, persistent.  She has been wiping back to front.  I reviewed the past medical history, family history, social history, surgical history, and allergies today and no changes were needed.  Please see the problem list section below in epic for further details.  Past Medical History: Past Medical History:  Diagnosis Date  . Anxiety   . Asthma   . Chest pain, atypical   . Closed head injury 06/06/2017  . Heart palpitations    hx of MVP  . IBS (irritable bowel syndrome)    Past Surgical History: Past Surgical History:  Procedure Laterality Date  . eAB     x2    Social History: Social History   Socioeconomic History  . Marital status: Divorced    Spouse name: Not on file  . Number of children: Not on file  . Years of education: Not on file  . Highest education level: Not on file  Occupational History  . Not on file  Social Needs  . Financial resource strain: Not on file  . Food insecurity:    Worry: Not on file    Inability: Not on file  . Transportation needs:    Medical: Not on file    Non-medical: Not on file  Tobacco Use  . Smoking status: Never Smoker  . Smokeless tobacco: Never Used  . Tobacco comment: nonsmoker  Substance and Sexual Activity  . Alcohol use: No  . Drug use: No  . Sexual activity: Yes  Lifestyle  . Physical activity:    Days per week: Not on file    Minutes per session: Not on file  . Stress: Not on file  Relationships  . Social connections:    Talks on phone: Not on file    Gets together: Not on file    Attends religious service: Not on file    Active member of club or organization: Not on file    Attends meetings of clubs or organizations: Not on file    Relationship status: Not on file  Other Topics  Concern  . Not on file  Social History Narrative   Divorced; grown son lives in [place].    [facility]- PCP   Family History: Family History  Problem Relation Age of Onset  . Heart attack Father   . Lung cancer Mother    Allergies: Allergies  Allergen Reactions  . Penicillins Shortness Of Breath    Reaction as a child  . Effexor [Venlafaxine] Other (See Comments)    "dopey"   Medications: See med rec.  Review of Systems: No fevers, chills, night sweats, weight loss, chest pain, or shortness of breath.   Objective:    General: Well Developed, well nourished, and in no acute distress.  Neuro: Alert and oriented x3, extra-ocular muscles intact, sensation grossly intact.  HEENT: Normocephalic, atraumatic, pupils equal round reactive to light, neck supple, no masses, no lymphadenopathy, thyroid nonpalpable.  Skin: Warm and dry, no rashes. Cardiac: Regular rate and rhythm, no murmurs rubs or gallops, no lower extremity edema.  Respiratory: Clear to auscultation bilaterally. Not using accessory muscles, speaking in full sentences. Abdomen: Soft, minimally tender over the suprapubic region, nondistended, no bowel sounds, no palpable masses, or guarding, rigidity, rebound tenderness, no overt costovertebral angle  pain.  Urinalysis is high specific gravity and is positive for blood.  No leukocytes or nitrites.  Impression and Recommendations:    Dysuria Likely uncomplicated cystitis. Hematuria without pyuria, adding abdominal x-ray to ensure no nephrolith. High specific gravity indicating dehydration, and thus concentrated urine can also cause dysuria. Adding Keflex, educated on wiping front to back and avoiding after sex. Awaiting urine culture, also adding Pyridium. ___________________________________________ Gwen Her. Dianah Field, M.D., ABFM., CAQSM. Primary Care and [place] Instructor of [place] of [facility] of Medicine

## 2018-05-02 ENCOUNTER — Ambulatory Visit: Payer: BLUE CROSS/BLUE SHIELD | Admitting: Family Medicine

## 2018-05-02 LAB — URINALYSIS, MICROSCOPIC ONLY: Hyaline Cast: NONE SEEN /LPF

## 2018-05-02 LAB — URINE CULTURE
MICRO NUMBER:: 91180230
SPECIMEN QUALITY:: ADEQUATE

## 2018-05-09 ENCOUNTER — Encounter: Payer: Self-pay | Admitting: Sports Medicine

## 2018-05-10 ENCOUNTER — Encounter: Payer: Self-pay | Admitting: Family Medicine

## 2018-05-29 ENCOUNTER — Encounter: Payer: Self-pay | Admitting: Family Medicine

## 2018-05-30 ENCOUNTER — Encounter: Payer: Self-pay | Admitting: Family Medicine

## 2018-05-30 DIAGNOSIS — R748 Abnormal levels of other serum enzymes: Secondary | ICD-10-CM

## 2018-05-31 ENCOUNTER — Encounter: Payer: Self-pay | Admitting: Family Medicine

## 2018-06-03 ENCOUNTER — Ambulatory Visit: Payer: BLUE CROSS/BLUE SHIELD | Admitting: Family Medicine

## 2018-06-04 ENCOUNTER — Ambulatory Visit: Payer: BLUE CROSS/BLUE SHIELD | Admitting: Family Medicine

## 2018-06-07 ENCOUNTER — Encounter: Payer: Self-pay | Admitting: Family Medicine

## 2018-06-08 ENCOUNTER — Encounter: Payer: Self-pay | Admitting: Family Medicine

## 2018-06-10 ENCOUNTER — Encounter: Payer: Self-pay | Admitting: Family Medicine

## 2018-06-10 ENCOUNTER — Ambulatory Visit: Payer: BLUE CROSS/BLUE SHIELD | Admitting: Family Medicine

## 2018-06-10 VITALS — BP 136/70 | HR 79 | Wt 192.0 lb

## 2018-06-10 DIAGNOSIS — F411 Generalized anxiety disorder: Secondary | ICD-10-CM | POA: Diagnosis not present

## 2018-06-10 DIAGNOSIS — I10 Essential (primary) hypertension: Secondary | ICD-10-CM | POA: Diagnosis not present

## 2018-06-10 MED ORDER — CITALOPRAM HYDROBROMIDE 20 MG PO TABS: 20.0000 mg | ORAL_TABLET | Freq: Every day | ORAL | 1 refills | Status: DC
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
## 2018-06-10 NOTE — Patient Instructions (Addendum)
Thank you for coming in today. Increase exercise. Walking should be helpful.  Increase lisinopril to 10mg  twice daily.   Increase celexa to 20mg  daily.   Recheck blood pressure and mood in 1 month.

## 2018-06-11 DIAGNOSIS — M9902 Segmental and somatic dysfunction of thoracic region: Secondary | ICD-10-CM | POA: Diagnosis not present

## 2018-06-11 DIAGNOSIS — M50321 Other cervical disc degeneration at C4-C5 level: Secondary | ICD-10-CM | POA: Diagnosis not present

## 2018-06-11 DIAGNOSIS — M50323 Other cervical disc degeneration at C6-C7 level: Secondary | ICD-10-CM | POA: Diagnosis not present

## 2018-06-11 DIAGNOSIS — M9901 Segmental and somatic dysfunction of cervical region: Secondary | ICD-10-CM | POA: Diagnosis not present

## 2018-06-12 ENCOUNTER — Encounter: Payer: Self-pay | Admitting: Family Medicine

## 2018-06-13 DIAGNOSIS — M50323 Other cervical disc degeneration at C6-C7 level: Secondary | ICD-10-CM | POA: Diagnosis not present

## 2018-06-13 DIAGNOSIS — M9902 Segmental and somatic dysfunction of thoracic region: Secondary | ICD-10-CM | POA: Diagnosis not present

## 2018-06-13 DIAGNOSIS — M9901 Segmental and somatic dysfunction of cervical region: Secondary | ICD-10-CM | POA: Diagnosis not present

## 2018-06-13 DIAGNOSIS — M50321 Other cervical disc degeneration at C4-C5 level: Secondary | ICD-10-CM | POA: Diagnosis not present

## 2018-06-18 ENCOUNTER — Encounter: Payer: Self-pay | Admitting: Family Medicine

## 2018-06-19 DIAGNOSIS — M50321 Other cervical disc degeneration at C4-C5 level: Secondary | ICD-10-CM | POA: Diagnosis not present

## 2018-06-19 DIAGNOSIS — M50323 Other cervical disc degeneration at C6-C7 level: Secondary | ICD-10-CM | POA: Diagnosis not present

## 2018-06-19 DIAGNOSIS — M9901 Segmental and somatic dysfunction of cervical region: Secondary | ICD-10-CM | POA: Diagnosis not present

## 2018-06-19 DIAGNOSIS — M9902 Segmental and somatic dysfunction of thoracic region: Secondary | ICD-10-CM | POA: Diagnosis not present

## 2018-06-19 MED ORDER — CITALOPRAM HYDROBROMIDE 10 MG PO TABS: 10.0000 mg | ORAL_TABLET | Freq: Every day | ORAL | 1 refills | Status: DC

## 2018-06-25 DIAGNOSIS — M50323 Other cervical disc degeneration at C6-C7 level: Secondary | ICD-10-CM | POA: Diagnosis not present

## 2018-06-25 DIAGNOSIS — M9901 Segmental and somatic dysfunction of cervical region: Secondary | ICD-10-CM | POA: Diagnosis not present

## 2018-06-25 DIAGNOSIS — M50321 Other cervical disc degeneration at C4-C5 level: Secondary | ICD-10-CM | POA: Diagnosis not present

## 2018-06-25 DIAGNOSIS — M9902 Segmental and somatic dysfunction of thoracic region: Secondary | ICD-10-CM | POA: Diagnosis not present

## 2018-06-30 ENCOUNTER — Encounter: Payer: Self-pay | Admitting: Family Medicine

## 2018-06-30 HISTORY — PX: BREAST SURGERY: SHX581

## 2018-07-02 ENCOUNTER — Encounter: Payer: Self-pay | Admitting: Family Medicine

## 2018-07-02 MED ORDER — TRIAMCINOLONE ACETONIDE 0.5 % EX CREA: 1.0000 "application " | TOPICAL_CREAM | Freq: Two times a day (BID) | CUTANEOUS | 3 refills | Status: DC

## 2018-07-02 MED ORDER — BUDESONIDE-FORMOTEROL FUMARATE 160-4.5 MCG/ACT IN AERO: 2.0000 | INHALATION_SPRAY | Freq: Two times a day (BID) | RESPIRATORY_TRACT | 1 refills | Status: DC

## 2018-07-02 MED ORDER — LISINOPRIL 10 MG PO TABS: 10.0000 mg | ORAL_TABLET | Freq: Two times a day (BID) | ORAL | 1 refills | Status: DC

## 2018-07-02 MED ORDER — CITALOPRAM HYDROBROMIDE 10 MG PO TABS: 10.0000 mg | ORAL_TABLET | Freq: Every day | ORAL | 1 refills | Status: DC

## 2018-07-04 DIAGNOSIS — M9902 Segmental and somatic dysfunction of thoracic region: Secondary | ICD-10-CM | POA: Diagnosis not present

## 2018-07-04 DIAGNOSIS — M50321 Other cervical disc degeneration at C4-C5 level: Secondary | ICD-10-CM | POA: Diagnosis not present

## 2018-07-04 DIAGNOSIS — M9901 Segmental and somatic dysfunction of cervical region: Secondary | ICD-10-CM | POA: Diagnosis not present

## 2018-07-04 DIAGNOSIS — M50323 Other cervical disc degeneration at C6-C7 level: Secondary | ICD-10-CM | POA: Diagnosis not present

## 2018-07-08 ENCOUNTER — Encounter: Payer: Self-pay | Admitting: Family Medicine

## 2018-07-08 ENCOUNTER — Other Ambulatory Visit: Payer: Self-pay | Admitting: Family Medicine

## 2018-07-08 DIAGNOSIS — Z1231 Encounter for screening mammogram for malignant neoplasm of breast: Secondary | ICD-10-CM

## 2018-07-10 ENCOUNTER — Ambulatory Visit: Payer: BLUE CROSS/BLUE SHIELD | Admitting: Family Medicine

## 2018-07-11 ENCOUNTER — Ambulatory Visit (INDEPENDENT_AMBULATORY_CARE_PROVIDER_SITE_OTHER): Payer: BLUE CROSS/BLUE SHIELD

## 2018-07-11 DIAGNOSIS — Z1231 Encounter for screening mammogram for malignant neoplasm of breast: Secondary | ICD-10-CM

## 2018-07-11 DIAGNOSIS — R928 Other abnormal and inconclusive findings on diagnostic imaging of breast: Secondary | ICD-10-CM

## 2018-07-12 ENCOUNTER — Other Ambulatory Visit: Payer: Self-pay | Admitting: Family Medicine

## 2018-07-12 ENCOUNTER — Encounter: Payer: Self-pay | Admitting: Family Medicine

## 2018-07-12 DIAGNOSIS — R928 Other abnormal and inconclusive findings on diagnostic imaging of breast: Secondary | ICD-10-CM

## 2018-07-14 ENCOUNTER — Encounter: Payer: Self-pay | Admitting: Family Medicine

## 2018-07-15 ENCOUNTER — Encounter: Payer: Self-pay | Admitting: Family Medicine

## 2018-07-15 ENCOUNTER — Ambulatory Visit: Payer: BLUE CROSS/BLUE SHIELD | Admitting: Family Medicine

## 2018-07-15 VITALS — BP 137/67 | HR 79 | Ht 64.0 in | Wt 189.0 lb

## 2018-07-15 DIAGNOSIS — R928 Other abnormal and inconclusive findings on diagnostic imaging of breast: Secondary | ICD-10-CM | POA: Diagnosis not present

## 2018-07-15 NOTE — Patient Instructions (Addendum)
Thank you for coming in today. You should hear from radiology soon after the scan.  Continue the creams and the blood pressure medicine.    Mammogram A mammogram is an X-ray of the breasts that is done to check for changes that are not normal. This test can screen for and find any changes that may suggest breast cancer. This test can also help to find other changes and variations in the breast. What happens before the procedure?  Have this test done about 1-2 weeks after your period. This is usually when your breasts are the least tender.  If you are visiting a new doctor or clinic, send any past mammogram images to your new doctor's office.  Wash your breasts and under your arms the day of the test.  Do not use deodorants, perfumes, lotions, or powders on the day of the test.  Take off any jewelry from your neck.  Wear clothes that you can change into and out of easily. What happens during the procedure?  You will undress from the waist up. You will put on a gown.  You will stand in front of the X-ray machine.  Each breast will be placed between two plastic or glass plates. The plates will press down on your breast for a few seconds. Try to stay as relaxed as possible. This does not cause any harm to your breasts. Any discomfort you feel will be very brief.  X-rays will be taken from different angles of each breast. The procedure may vary among doctors and hospitals. What happens after the procedure?  The mammogram will be looked at by a specialist (radiologist).  You may need to do certain parts of the test again. This depends on the quality of the images.  Ask when your test results will be ready. Make sure you get your test results.  You may go back to your normal activities. This information is not intended to replace advice given to you by your health care provider. Make sure you discuss any questions you have with your health care provider. Document Released: 10/13/2008  Document Revised: 12/23/2015 Document Reviewed: 09/25/2014 Elsevier Interactive Patient Education  [employer].

## 2018-07-15 NOTE — Progress Notes (Signed)
[name] is a 62 y.o. female who presents to [facility]: [place] today for follow-up abnormal mammogram.  [name] recently had a screening mammogram which was abnormal.  She notes that she is very anxious about her test results.  She is scheduled for diagnostic mammogram and ultrasound tomorrow morning.  She does note some right breast fullness but feels well otherwise.  No history of breast cancer or family history of breast cancer.   ROS as above:  Exam:  BP 137/67   Pulse 79   Ht 5\' 4"  (1.626 m)   Wt 189 lb (85.7 kg)   BMI 32.44 kg/m  Wt Readings from Last 5 Encounters:  07/15/18 189 lb (85.7 kg)  06/10/18 192 lb (87.1 kg)  04/15/18 195 lb (88.5 kg)  04/11/18 195 lb (88.5 kg)  03/28/18 198 lb (89.8 kg)    Gen: Well NAD HEENT: EOMI,  MMM Lungs: Normal work of breathing. CTABL Heart: RRR no MRG Abd: NABS, Soft. Nondistended, Nontender Exts: Brisk capillary refill, warm and well perfused.  Patient declines breast exam today  Lab and Radiology Results Dg Abd 2 Views  Result Date: 04/30/2018 CLINICAL DATA:  Bilateral flank pain and hematuria for 1 week. EXAM: ABDOMEN - 2 VIEW COMPARISON:  None. FINDINGS: The bowel gas pattern is normal. There is no evidence of free air. Extensive bowel content is identified throughout colon. Left pelvic phlebolith is noted. No radio-opaque calculi or other significant radiographic abnormality is seen. IMPRESSION: No bowel obstruction.  Constipation. Electronically Signed   By: Abelardo Diesel M.D.   On: 04/30/2018 14:10   Mm 3d Screen Breast Bilateral  Result Date: 07/11/2018 CLINICAL DATA:  Screening. EXAM: DIGITAL SCREENING BILATERAL MAMMOGRAM WITH TOMO AND CAD COMPARISON:  Previous exam(s). ACR Breast Density Category b: There are scattered areas of fibroglandular density. FINDINGS: In the right breast, a possible masses warrant  further evaluation. In the left breast, no findings suspicious for malignancy. Images were processed with CAD. IMPRESSION: Further evaluation is suggested for possible masses in the right breast. RECOMMENDATION: Diagnostic mammogram and possibly ultrasound of the right breast. (Code:[id number]) The patient will be contacted regarding the findings, and additional imaging will be scheduled. BI-RADS CATEGORY  0: Incomplete. Need additional imaging evaluation and/or prior mammograms for comparison. Electronically Signed   By: Lajean Manes M.D.   On: 07/11/2018 16:16      Assessment and Plan: 62 y.o. female with  Abnormal screening mammogram.  Patient already scheduled for diagnostic mammogram and ultrasound.  Discussion regarding her mammogram results in next steps.  Additionally discussed that it is not likely that she does have breast cancer.  We will follow-up.  I spent 15 minutes with this patient, greater than 50% was face-to-face time counseling regarding differential diagnosis and treatment plan and options..    No orders of the defined types were placed in this encounter.  No orders of the defined types were placed in this encounter.    Historical information moved to improve visibility of documentation.  Past Medical History:  Diagnosis Date  . Anxiety   . Asthma   . Chest pain, atypical   . Closed head injury 06/06/2017  . Heart palpitations    hx of MVP  . IBS (irritable bowel syndrome)    Past Surgical History:  Procedure Laterality Date  . eAB     x2    Social History   Tobacco Use  . Smoking status:  Never Smoker  . Smokeless tobacco: Never Used  . Tobacco comment: nonsmoker  Substance Use Topics  . Alcohol use: No   family history includes Heart attack in her father; Lung cancer in her mother.  Medications: Current Outpatient Medications  Medication Sig Dispense Refill  . AMBULATORY NON FORMULARY MEDICATION Freestyle promise meter Use daily as directed 1 each  0  . AMBULATORY NON FORMULARY MEDICATION Freestyle test strips To use with freestyle promise meter  Test blood sugar twice a day 100 each 11  . aspirin 81 MG tablet Take 1 tablet (81 mg total) by mouth daily. 30 tablet   . B Complex-C (B-COMPLEX WITH VITAMIN C) tablet Take 1 tablet by mouth daily.      . budesonide-formoterol (SYMBICORT) 160-4.5 MCG/ACT inhaler Inhale 2 puffs into the lungs 2 (two) times daily. 3 Inhaler 1  . Calcium-Magnesium-Vitamin D (CALCIUM MAGNESIUM PO) Take by mouth.    . citalopram (CELEXA) 10 MG tablet Take 1 tablet (10 mg total) by mouth daily. 90 tablet 1  . Garlic Oil 9024 MG CAPS 1 capsule 2 (two) times daily.      . hydrOXYzine (ATARAX/VISTARIL) 25 MG tablet TAKE 1 TABLET (25 MG TOTAL) BY MOUTH 3 (THREE) TIMES DAILY AS NEEDED FOR ANXIETY. 30 tablet 1  . ketoconazole (NIZORAL) 2 % cream Apply 1 application topically 2 (two) times daily. To affected areas. 60 g 3  . lisinopril (PRINIVIL,ZESTRIL) 10 MG tablet Take 1 tablet (10 mg total) by mouth 2 (two) times daily. 180 tablet 1  . loratadine (CLARITIN) 10 MG tablet Take 1 tablet (10 mg total) by mouth daily. 90 tablet 3  . olopatadine (PATANOL) 0.1 % ophthalmic solution Place 1 drop into both eyes 2 (two) times daily as needed for allergies. 5 mL 12  . Omega-3 Fatty Acids (FISH OIL) 1000 MG CAPS Take 1 capsule by mouth daily.      Marland Kitchen triamcinolone cream (KENALOG) 0.5 % Apply 1 application topically 2 (two) times daily. To affected areas. 454 g 3  . vitamin C (ASCORBIC ACID) 500 MG tablet Take 500 mg by mouth daily.      Marland Kitchen albuterol (PROAIR HFA) 108 (90 Base) MCG/ACT inhaler Inhale 2 puffs into the lungs every 6 (six) hours as needed for wheezing. 1 Inhaler 6   No current facility-administered medications for this visit.    Allergies  Allergen Reactions  . Penicillins Shortness Of Breath    Reaction as a child  . Effexor [Venlafaxine] Other (See Comments)    "dopey"     Discussed warning signs or symptoms.  Please see discharge instructions. Patient expresses understanding.

## 2018-07-16 ENCOUNTER — Encounter: Payer: Self-pay | Admitting: Family Medicine

## 2018-07-16 ENCOUNTER — Ambulatory Visit
Admission: RE | Admit: 2018-07-16 | Discharge: 2018-07-16 | Disposition: A | Discharge status: Home / Self Care | Payer: BLUE CROSS/BLUE SHIELD | Source: Ambulatory Visit | Attending: Family Medicine | Admitting: Family Medicine

## 2018-07-16 ENCOUNTER — Other Ambulatory Visit: Payer: Self-pay | Admitting: Family Medicine

## 2018-07-16 DIAGNOSIS — R928 Other abnormal and inconclusive findings on diagnostic imaging of breast: Secondary | ICD-10-CM

## 2018-07-16 DIAGNOSIS — N6311 Unspecified lump in the right breast, upper outer quadrant: Secondary | ICD-10-CM | POA: Diagnosis not present

## 2018-07-16 DIAGNOSIS — R599 Enlarged lymph nodes, unspecified: Secondary | ICD-10-CM

## 2018-07-16 DIAGNOSIS — N6310 Unspecified lump in the right breast, unspecified quadrant: Secondary | ICD-10-CM

## 2018-07-17 ENCOUNTER — Ambulatory Visit
Admission: RE | Admit: 2018-07-17 | Discharge: 2018-07-17 | Disposition: A | Discharge status: Home / Self Care | Payer: BLUE CROSS/BLUE SHIELD | Source: Ambulatory Visit | Attending: Family Medicine | Admitting: Family Medicine

## 2018-07-17 ENCOUNTER — Other Ambulatory Visit: Payer: Self-pay | Admitting: Family Medicine

## 2018-07-17 ENCOUNTER — Ambulatory Visit: Payer: BLUE CROSS/BLUE SHIELD | Admitting: Family Medicine

## 2018-07-17 DIAGNOSIS — R599 Enlarged lymph nodes, unspecified: Secondary | ICD-10-CM

## 2018-07-17 DIAGNOSIS — Z170 Estrogen receptor positive status [ER+]: Secondary | ICD-10-CM | POA: Diagnosis not present

## 2018-07-17 DIAGNOSIS — N6310 Unspecified lump in the right breast, unspecified quadrant: Secondary | ICD-10-CM

## 2018-07-17 DIAGNOSIS — C773 Secondary and unspecified malignant neoplasm of axilla and upper limb lymph nodes: Secondary | ICD-10-CM | POA: Diagnosis not present

## 2018-07-17 DIAGNOSIS — R590 Localized enlarged lymph nodes: Secondary | ICD-10-CM | POA: Diagnosis not present

## 2018-07-17 DIAGNOSIS — C50411 Malignant neoplasm of upper-outer quadrant of right female breast: Secondary | ICD-10-CM | POA: Diagnosis not present

## 2018-07-17 DIAGNOSIS — N6311 Unspecified lump in the right breast, upper outer quadrant: Secondary | ICD-10-CM | POA: Diagnosis not present

## 2018-07-19 ENCOUNTER — Encounter: Payer: Self-pay | Admitting: Family Medicine

## 2018-07-22 ENCOUNTER — Encounter: Payer: Self-pay | Admitting: Family Medicine

## 2018-07-25 ENCOUNTER — Ambulatory Visit (INDEPENDENT_AMBULATORY_CARE_PROVIDER_SITE_OTHER): Payer: BLUE CROSS/BLUE SHIELD | Admitting: Family Medicine

## 2018-07-25 ENCOUNTER — Encounter: Payer: Self-pay | Admitting: Family Medicine

## 2018-07-25 DIAGNOSIS — C50911 Malignant neoplasm of unspecified site of right female breast: Secondary | ICD-10-CM

## 2018-07-25 DIAGNOSIS — Z170 Estrogen receptor positive status [ER+]: Secondary | ICD-10-CM | POA: Diagnosis not present

## 2018-07-25 DIAGNOSIS — C50411 Malignant neoplasm of upper-outer quadrant of right female breast: Secondary | ICD-10-CM | POA: Insufficient documentation

## 2018-07-25 NOTE — Patient Instructions (Addendum)
Thank you for coming in today. Try to eat normally.  Juice is ok.  Listen to your treatment team but ask questions.   Let me know if you need anything.   Use moisturizers and steroid creams for rash.   Consider a humidifier.

## 2018-07-25 NOTE — Progress Notes (Signed)
[name] is a 62 y.o. female who presents to [place] today for discuss breast cancer diagnosis.  [name] had a screening mammogram on December 12 and had follow-up diagnostic mammogram and ultrasound revealing changes concerning for breast cancer with spread to axillary lymph nodes.  She had biopsy left breast and axillary node that did confirm the diagnosis of breast cancer.  She has invasive ductal carcinoma grade 3.  This is located in the right breast with what looks to be poorly differentiated carcinoma consistent with metastatic distal carcinoma spread to the right axillary lymph node.  Prognostic indicators are negative for Her2 but positive for estrogen and progesterone in the breast biopsy.  She is scheduled to see Dr Marlou Starks on 12/31.  She was told of this diagnosis on December 24.   In the interim she has done a lot of reading and has lots of questions and concerns.  She is started doing a juice diet over the last week or so and managed to lose intentionally about 9 pounds.  She is concerned about the possible side effects of chemotherapy.   ROS as above:  Exam:  BP (!) 151/73   Pulse 80   Temp 98.2 F (36.8 C) (Oral)   Wt 180 lb (81.6 kg)   BMI 30.90 kg/m  Wt Readings from Last 5 Encounters:  07/25/18 180 lb (81.6 kg)  07/15/18 189 lb (85.7 kg)  06/10/18 192 lb (87.1 kg)  04/15/18 195 lb (88.5 kg)  04/11/18 195 lb (88.5 kg)    Gen: Well NAD HEENT: EOMI,  MMM Lungs: Normal work of breathing. CTABL Heart: RRR no MRG Abd: NABS, Soft. Nondistended, Nontender Exts: Brisk capillary refill, warm and well perfused.   Lab and Radiology Results No results found for this or any previous visit (from the past 72 hour(s)). No results found.    Assessment and Plan: 62 y.o. female with  Right breast cancer as above.  She is currently awaiting staging and  has a follow-up appointment scheduled with a surgeon on December 21.  We spent time discussing typical treatment options for her kind of cancer.  Expect that she will likely have either mammogram or lumpectomy with lymph node dissection.  She will likely have adjuvant chemotherapy and estrogen treatments.  She may have radiation treatments as well.  She had lots of questions about this which I answered as best as I can given her still uncertain staging.  Plan to continue current treatment for other options.  Recommend eating a more conventional diet.  Recheck with me as needed.  Continue watchful waiting.  I spent 15 minutes with this patient, greater than 50% was face-to-face time counseling regarding pathology, diagnosis, and treatment options..  No orders of the defined types were placed in this encounter.  No orders of the defined types were placed in this encounter.    Historical information moved to improve visibility of documentation.  Past Medical History:  Diagnosis Date  . Anxiety   . Asthma   . Chest pain, atypical   . Closed head injury 06/06/2017  . Heart palpitations    hx of MVP  . IBS (irritable bowel syndrome)    Past Surgical History:  Procedure Laterality Date  . eAB     x2    Social History   Tobacco Use  . Smoking status: Never Smoker  . Smokeless tobacco: Never Used  . Tobacco comment: nonsmoker  Substance Use Topics  . Alcohol use: No   family history includes Heart attack in her father; Lung cancer in her mother.  Medications: Current Outpatient Medications  Medication Sig Dispense Refill  . AMBULATORY NON FORMULARY MEDICATION Freestyle promise meter Use daily as directed 1 each 0  . AMBULATORY NON FORMULARY MEDICATION Freestyle test strips To use with freestyle promise meter  Test blood sugar twice a day 100 each 11  . aspirin 81 MG tablet Take 1 tablet (81 mg total) by mouth daily. 30 tablet   . B Complex-C (B-COMPLEX WITH VITAMIN C) tablet Take  1 tablet by mouth daily.      . budesonide-formoterol (SYMBICORT) 160-4.5 MCG/ACT inhaler Inhale 2 puffs into the lungs 2 (two) times daily. 3 Inhaler 1  . Calcium-Magnesium-Vitamin D (CALCIUM MAGNESIUM PO) Take by mouth.    . citalopram (CELEXA) 10 MG tablet Take 1 tablet (10 mg total) by mouth daily. 90 tablet 1  . Garlic Oil 5284 MG CAPS 1 capsule 2 (two) times daily.      . hydrOXYzine (ATARAX/VISTARIL) 25 MG tablet TAKE 1 TABLET (25 MG TOTAL) BY MOUTH 3 (THREE) TIMES DAILY AS NEEDED FOR ANXIETY. 30 tablet 1  . ketoconazole (NIZORAL) 2 % cream Apply 1 application topically 2 (two) times daily. To affected areas. 60 g 3  . lisinopril (PRINIVIL,ZESTRIL) 10 MG tablet Take 1 tablet (10 mg total) by mouth 2 (two) times daily. 180 tablet 1  . loratadine (CLARITIN) 10 MG tablet Take 1 tablet (10 mg total) by mouth daily. 90 tablet 3  . olopatadine (PATANOL) 0.1 % ophthalmic solution Place 1 drop into both eyes 2 (two) times daily as needed for allergies. 5 mL 12  . Omega-3 Fatty Acids (FISH OIL) 1000 MG CAPS Take 1 capsule by mouth daily.      Marland Kitchen triamcinolone cream (KENALOG) 0.5 % Apply 1 application topically 2 (two) times daily. To affected areas. 454 g 3  . vitamin C (ASCORBIC ACID) 500 MG tablet Take 500 mg by mouth daily.      Marland Kitchen albuterol (PROAIR HFA) 108 (90 Base) MCG/ACT inhaler Inhale 2 puffs into the lungs every 6 (six) hours as needed for wheezing. 1 Inhaler 6   No current facility-administered medications for this visit.    Allergies  Allergen Reactions  . Penicillins Shortness Of Breath    Reaction as a child  . Effexor [Venlafaxine] Other (See Comments)    "dopey"     Discussed warning signs or symptoms. Please see discharge instructions. Patient expresses understanding.

## 2018-07-26 ENCOUNTER — Encounter: Payer: Self-pay | Admitting: Family Medicine

## 2018-07-30 DIAGNOSIS — Z170 Estrogen receptor positive status [ER+]: Secondary | ICD-10-CM | POA: Diagnosis not present

## 2018-07-30 DIAGNOSIS — C50411 Malignant neoplasm of upper-outer quadrant of right female breast: Secondary | ICD-10-CM | POA: Diagnosis not present

## 2018-08-01 ENCOUNTER — Encounter: Payer: Self-pay | Admitting: Family Medicine

## 2018-08-01 ENCOUNTER — Other Ambulatory Visit: Payer: Self-pay | Admitting: General Surgery

## 2018-08-01 DIAGNOSIS — C50411 Malignant neoplasm of upper-outer quadrant of right female breast: Secondary | ICD-10-CM

## 2018-08-01 DIAGNOSIS — C50412 Malignant neoplasm of upper-outer quadrant of left female breast: Secondary | ICD-10-CM

## 2018-08-01 DIAGNOSIS — Z171 Estrogen receptor negative status [ER-]: Principal | ICD-10-CM

## 2018-08-03 ENCOUNTER — Encounter: Payer: Self-pay | Admitting: Family Medicine

## 2018-08-05 ENCOUNTER — Ambulatory Visit
Admission: RE | Admit: 2018-08-05 | Discharge: 2018-08-05 | Disposition: A | Discharge status: Home / Self Care | Payer: BLUE CROSS/BLUE SHIELD | Source: Ambulatory Visit | Attending: General Surgery | Admitting: General Surgery

## 2018-08-05 DIAGNOSIS — C50411 Malignant neoplasm of upper-outer quadrant of right female breast: Secondary | ICD-10-CM

## 2018-08-05 DIAGNOSIS — D0511 Intraductal carcinoma in situ of right breast: Secondary | ICD-10-CM | POA: Diagnosis not present

## 2018-08-05 DIAGNOSIS — Z171 Estrogen receptor negative status [ER-]: Principal | ICD-10-CM

## 2018-08-05 DIAGNOSIS — C50412 Malignant neoplasm of upper-outer quadrant of left female breast: Secondary | ICD-10-CM

## 2018-08-05 MED ORDER — GADOBUTROL 1 MMOL/ML IV SOLN
7.0000 mL | Freq: Once | INTRAVENOUS | Status: AC | PRN
  Administered 2018-08-05: 7 mL via INTRAVENOUS

## 2018-08-06 ENCOUNTER — Encounter: Payer: Self-pay | Admitting: Family Medicine

## 2018-08-06 MED ORDER — IRBESARTAN 75 MG PO TABS: 75.0000 mg | ORAL_TABLET | Freq: Every day | ORAL | 1 refills | Status: DC

## 2018-08-06 NOTE — Progress Notes (Signed)
[facility] CONSULT NOTE  Patient Care Team: Gregor Hams, MD as PCP - General (Family Medicine) Josue Hector, MD as Consulting Physician (Cardiology) [name], MD as Consulting Physician (General Surgery)  CHIEF COMPLAINTS/PURPOSE OF CONSULTATION: Newly diagnosed breast cancer  HISTORY OF PRESENTING ILLNESS:  [name] 63 y.o. female is here because of recent diagnosis of invasive ductal carcinoma of the right breast. The cancer was detected on a routine screening mammogram on 07/11/18 and was not palpable prior to diagnosis. A diagnostic mammogram on 07/16/18 showed two masses in the right breast, a 2.8cm mass at the 10 o'clock position 1cm from the nipple and a 2.9cm mass at the 10:30 position 10cm from the nipple, in addition to a suspicious right axillary lymph node. A biopsy on 07/17/18 showed both tumors to be stage 2, grade 3 invasive ductal carcinoma, with the first tumor being HER2 negative, ER 100%, PR 20%, Ki67 80% and the second tumor HER 2 negative, ER 60%, PR negative, Ki67 70%. The lymph node was found to have poorly differentiated carcinoma, HER2 negative, ER 90%, PR 20%, Ki67 60%. An MRI on 08/05/2018 showed dermal involvement of the cancer of the right breast and measured it at 10.9cm.   She presents to the clinic today alone. She notes she has been eating much healthier lately and went on a juice cleanse. She is very worried about chemotherapy, especially it affecting her immune system and heart and has been taking herbal remedies and vitamins. She has been fatigued recently and very stressed and anxious about the cancer. She wondered if she could avoid treatment and use nutrients and a healthier diet for the cancer and believes her poor relationships contributed to her cancer. She has recently lost some long term friendships and currently only has one good friend and is divorced. She has one brother in [place] with bladder cancer and one son who she has  a poor relationship with who has stage 3 rectal cancer.  I reviewed her records extensively and collaborated the history with the patient.  SUMMARY OF ONCOLOGIC HISTORY:   Malignant neoplasm of upper-outer quadrant of right breast in female, estrogen receptor positive ([place])   07/11/2018 Mammogram    Screening Mammogram  Possible masses of the right breast    07/16/2018 Mammogram    Diagnostic Mammogram  2.8cm mass at the 10 o'clock position 1cm from the nipple; 2.9cm mass at the 10:30 position 10cm from the nipple; suspicious right axillary lymph node    07/17/2018 Initial Biopsy    Right Breast Biopsy  Stage 2, grade 3 invasive ductal carcinoma; tumor at 10 o'clock 10cm fn, HER2 negative, ER 100%, PR 20%, Ki67 80%; tumor at 10:30 o'clock 10cm fn HER 2 negative, ER 60%, PR negative, Ki67 70%; lymph node, poorly differentiated carcinoma, HER2 negative, ER 90%, PR 20%, Ki67 60%.    07/25/2018 Initial Diagnosis    Malignant neoplasm of upper-outer quadrant of right breast in female, estrogen receptor positive ([place])    08/05/2018 Breast MRI    Numerous masses in the lateral portion of the right breast measuring up to 10.9 centimeters in anterior-posterior extent; enhancement and edema of the skin of the RIGHT breast, consistent with dermal involvement; enlarged right axillary lymph node; left breast is negative.    MEDICAL HISTORY:  Past Medical History:  Diagnosis Date  . Anxiety   . Asthma   . Chest pain, atypical   . Closed head injury 06/06/2017  .  Heart palpitations    hx of MVP  . IBS (irritable bowel syndrome)     SURGICAL HISTORY: Past Surgical History:  Procedure Laterality Date  . eAB     x2     SOCIAL HISTORY: Social History   Socioeconomic History  . Marital status: Divorced    Spouse name: Not on file  . Number of children: Not on file  . Years of education: Not on file  . Highest education level: Not on file  Occupational History  . Not on file  Social  Needs  . Financial resource strain: Not on file  . Food insecurity:    Worry: Not on file    Inability: Not on file  . Transportation needs:    Medical: Not on file    Non-medical: Not on file  Tobacco Use  . Smoking status: Never Smoker  . Smokeless tobacco: Never Used  . Tobacco comment: nonsmoker  Substance and Sexual Activity  . Alcohol use: No  . Drug use: No  . Sexual activity: Yes  Lifestyle  . Physical activity:    Days per week: Not on file    Minutes per session: Not on file  . Stress: Not on file  Relationships  . Social connections:    Talks on phone: Not on file    Gets together: Not on file    Attends religious service: Not on file    Active member of club or organization: Not on file    Attends meetings of clubs or organizations: Not on file    Relationship status: Not on file  . Intimate partner violence:    Fear of current or ex partner: Not on file    Emotionally abused: Not on file    Physically abused: Not on file    Forced sexual activity: Not on file  Other Topics Concern  . Not on file  Social History Narrative   Divorced; grown son lives in [place].    [facility]- PCP    FAMILY HISTORY: Family History  Problem Relation Age of Onset  . Heart attack Father   . Lung cancer Mother     ALLERGIES:  is allergic to penicillins and effexor [venlafaxine].  MEDICATIONS:  Current Outpatient Medications  Medication Sig Dispense Refill  . albuterol (PROAIR HFA) 108 (90 Base) MCG/ACT inhaler Inhale 2 puffs into the lungs every 6 (six) hours as needed for wheezing. 1 Inhaler 6  . AMBULATORY NON FORMULARY MEDICATION Freestyle promise meter Use daily as directed 1 each 0  . AMBULATORY NON FORMULARY MEDICATION Freestyle test strips To use with freestyle promise meter  Test blood sugar twice a day 100 each 11  . aspirin 81 MG tablet Take 1 tablet (81 mg total) by mouth daily. 30 tablet   . B Complex-C (B-COMPLEX WITH VITAMIN C) tablet Take 1  tablet by mouth daily.      . budesonide-formoterol (SYMBICORT) 160-4.5 MCG/ACT inhaler Inhale 2 puffs into the lungs 2 (two) times daily. 3 Inhaler 1  . Calcium-Magnesium-Vitamin D (CALCIUM MAGNESIUM PO) Take by mouth.    . citalopram (CELEXA) 10 MG tablet Take 1 tablet (10 mg total) by mouth daily. 90 tablet 1  . Garlic Oil 6270 MG CAPS 1 capsule 2 (two) times daily.      . hydrOXYzine (ATARAX/VISTARIL) 25 MG tablet TAKE 1 TABLET (25 MG TOTAL) BY MOUTH 3 (THREE) TIMES DAILY AS NEEDED FOR ANXIETY. 30 tablet 1  . irbesartan (AVAPRO) 75 MG tablet Take  1 tablet (75 mg total) by mouth daily. 90 tablet 1  . ketoconazole (NIZORAL) 2 % cream Apply 1 application topically 2 (two) times daily. To affected areas. 60 g 3  . loratadine (CLARITIN) 10 MG tablet Take 1 tablet (10 mg total) by mouth daily. 90 tablet 3  . olopatadine (PATANOL) 0.1 % ophthalmic solution Place 1 drop into both eyes 2 (two) times daily as needed for allergies. 5 mL 12  . Omega-3 Fatty Acids (FISH OIL) 1000 MG CAPS Take 1 capsule by mouth daily.      Marland Kitchen triamcinolone cream (KENALOG) 0.5 % Apply 1 application topically 2 (two) times daily. To affected areas. 454 g 3  . vitamin C (ASCORBIC ACID) 500 MG tablet Take 500 mg by mouth daily.       No current facility-administered medications for this visit.     REVIEW OF SYSTEMS:   Constitutional: Denies fevers, chills or abnormal night sweats (+) fatigue Eyes: Denies blurriness of vision, double vision or watery eyes Ears, nose, mouth, throat, and face: Denies mucositis or sore throat Respiratory: Denies cough, dyspnea or wheezes Cardiovascular: Denies palpitation, chest discomfort or lower extremity swelling Gastrointestinal:  Denies nausea, heartburn or change in bowel habits Skin: Denies abnormal skin rashes Lymphatics: Denies new lymphadenopathy or easy bruising Neurological:Denies numbness, tingling or new weaknesses Behavioral/Psych: (+) anxiety  Breast: Denies any palpable  lumps or discharge All other systems were reviewed with the patient and are negative.  PHYSICAL EXAMINATION: ECOG PERFORMANCE STATUS: 1 - Symptomatic but completely ambulatory  Vitals:   08/07/18 0858  BP: (!) 156/77  Pulse: 82  Resp: 14  Temp: 98.3 F (36.8 C)  SpO2: 100%   Filed Weights   08/07/18 0858  Weight: 180 lb 4.8 oz (81.8 kg)    GENERAL:alert, no distress and comfortable SKIN: skin color, texture, turgor are normal, no rashes or significant lesions EYES: normal, conjunctiva are pink and non-injected, sclera clear OROPHARYNX:no exudate, no erythema and lips, buccal mucosa, and tongue normal  NECK: supple, thyroid normal size, non-tender, without nodularity LYMPH:  no palpable lymphadenopathy in the cervical, axillary or inguinal LUNGS: clear to auscultation and percussion with normal breathing effort HEART: regular rate & rhythm and no murmurs and no lower extremity edema ABDOMEN:abdomen soft, non-tender and normal bowel sounds Musculoskeletal:no cyanosis of digits and no clubbing  PSYCH: alert & oriented x 3 with fluent speech NEURO: no focal motor/sensory deficits  LABORATORY DATA:  I have reviewed the data as listed Lab Results  Component Value Date   WBC 5.0 10/10/2017   HGB 13.5 10/10/2017   HCT 39.6 10/10/2017   MCV 83.9 10/10/2017   PLT 290 10/10/2017   Lab Results  Component Value Date   NA 140 03/15/2018   K 4.7 03/15/2018   CL 103 03/15/2018   CO2 28 03/15/2018    RADIOGRAPHIC STUDIES: I have personally reviewed the radiological reports and agreed with the findings in the report.  ASSESSMENT AND PLAN:  Malignant neoplasm of upper-outer quadrant of right breast in female, estrogen receptor positive ([place]) 07/17/2018: Screening detected right breast masses 10 o'clock position 3 cm (1 cmfn), at 10:30 position 2.9 cm (10 cmfn) 1 abnormal lymph node biopsy of all 3 came back grade 3 IDC ER 60-100%, PR 0 to 20%, Ki-67 60 to 80%, HER-2 -0-1+ by IHC,  T2 N1 stage IIb clinical stage  Breast MRI: Confluent masses measuring 10.9 cm with edema of skin and enlarged lymph node  Pathology and radiology counseling:  Discussed with the patient, the details of pathology including the type of breast cancer,the clinical staging, the significance of ER, PR and HER-2/neu receptors and the implications for treatment. After reviewing the pathology in detail, we proceeded to discuss the different treatment options between surgery, radiation, chemotherapy, antiestrogen therapies.  Recommendation based on multidisciplinary tumor board: 1. Neoadjuvant chemotherapy with Adriamycin and Cytoxan dose dense 4 followed by Taxol weekly 12 2. Followed by breast conserving surgery with sentinel lymph node study vs targeted axillary dissection 3. Followed by adjuvant radiation therapy  Chemotherapy Counseling: I discussed the risks and benefits of chemotherapy including the risks of nausea/ vomiting, risk of infection from low WBC count, fatigue due to chemo or anemia, bruising or bleeding due to low platelets, mouth sores, loss/ change in taste and decreased appetite. Liver and kidney function will be monitored through out chemotherapy as abnormalities in liver and kidney function may be a side effect of treatment. Cardiac dysfunction due to Adriamycin was discussed in detail. Risk of permanent bone marrow dysfunction due to chemo were also discussed.  Plan: 1. Port placement 2. Echocardiogram 3. Chemotherapy class 4. Breast MRI 5. CT chest abdomen pelvis and bone scan for staging  Social issues: Patient has very poor social support.  It appears that all her friends and family have alienated her.  She has a dog Systems analyst.  She expressed profound interest in naturopathic treatments and may consider taking some of these medications through chemotherapy in the hopes of boosting immunity.  Return to clinic in 2 weeks to start chemotherapy.    All questions  were answered. The patient knows to call the clinic with any problems, questions or concerns.   Nicholas Lose, MD 08/07/2018   I, Cloyde Reams Dorshimer, am acting as scribe for Nicholas Lose, MD.  I have reviewed the above documentation for accuracy and completeness, and I agree with the above.

## 2018-08-07 ENCOUNTER — Inpatient Hospital Stay: Payer: BLUE CROSS/BLUE SHIELD | Attending: Hematology and Oncology | Admitting: Hematology and Oncology

## 2018-08-07 ENCOUNTER — Encounter: Payer: Self-pay | Admitting: Adult Health

## 2018-08-07 ENCOUNTER — Encounter: Payer: Self-pay | Admitting: *Deleted

## 2018-08-07 DIAGNOSIS — C50411 Malignant neoplasm of upper-outer quadrant of right female breast: Secondary | ICD-10-CM | POA: Diagnosis not present

## 2018-08-07 DIAGNOSIS — Z170 Estrogen receptor positive status [ER+]: Secondary | ICD-10-CM

## 2018-08-07 NOTE — Assessment & Plan Note (Addendum)
07/17/2018: Screening detected right breast masses 10 o'clock position 3 cm (1 cmfn), at 10:30 position 2.9 cm (10 cmfn) 1 abnormal lymph node biopsy of all 3 came back grade 3 IDC ER 60-100%, PR 0 to 20%, Ki-67 60 to 80%, HER-2 -0-1+ by IHC, T2 N1 stage IIb clinical stage  Breast MRI: Confluent masses measuring 10.9 cm with edema of skin and enlarged lymph node  Pathology and radiology counseling: Discussed with the patient, the details of pathology including the type of breast cancer,the clinical staging, the significance of ER, PR and HER-2/neu receptors and the implications for treatment. After reviewing the pathology in detail, we proceeded to discuss the different treatment options between surgery, radiation, chemotherapy, antiestrogen therapies.  Recommendation based on multidisciplinary tumor board: 1. Neoadjuvant chemotherapy with Adriamycin and Cytoxan dose dense 4 followed by Taxol weekly 12 2. Followed by breast conserving surgery with sentinel lymph node study vs targeted axillary dissection 3. Followed by adjuvant radiation therapy  Chemotherapy Counseling: I discussed the risks and benefits of chemotherapy including the risks of nausea/ vomiting, risk of infection from low WBC count, fatigue due to chemo or anemia, bruising or bleeding due to low platelets, mouth sores, loss/ change in taste and decreased appetite. Liver and kidney function will be monitored through out chemotherapy as abnormalities in liver and kidney function may be a side effect of treatment. Cardiac dysfunction due to Adriamycin was discussed in detail. Risk of permanent bone marrow dysfunction due to chemo were also discussed.  Plan: 1. Port placement 2. Echocardiogram 3. Chemotherapy class 4. Breast MRI 5. CT chest abdomen pelvis and bone scan for staging  Social issues: Patient has very poor social support.  It appears that all her friends and family have alienated her.  She has a dog Conservation officer, historic buildings.  She expressed profound interest in naturopathic treatments and may consider taking some of these medications through chemotherapy in the hopes of boosting immunity.  Return to clinic in 2 weeks to start chemotherapy.

## 2018-08-08 ENCOUNTER — Telehealth: Payer: Self-pay

## 2018-08-08 ENCOUNTER — Ambulatory Visit: Payer: Self-pay | Admitting: General Surgery

## 2018-08-08 NOTE — Telephone Encounter (Signed)
Spoke with pt with concerns about possible car t cell therapy for her breast cancer, instead of doing chemo. Told pt that at this time, car t cell are being studied and we do not offer this type of treatment at the [facility]. Pt was very anxious about starting treatment and overwhelmed about diagnosis.   Offered pt alight services, as well as social services. Pt states that [facility] had spoken to her about [facility] and may consider this resource, closer to her treatment. Pt states that she would like to continue on her naturopathic treatments, along with chemo to help keep her immune system strong. Told pt that she has to communicate all treatments that she is doing with oncologist to make sure it doesn't interact with chemotherapy. Pt verbalized understanding.  Reviewed and confirmed upcoming scans to be scheduled. Pt verbalized understanding and has no further needs at this time.

## 2018-08-09 ENCOUNTER — Telehealth: Payer: Self-pay | Admitting: Hematology and Oncology

## 2018-08-09 ENCOUNTER — Other Ambulatory Visit: Payer: Self-pay

## 2018-08-09 ENCOUNTER — Encounter (HOSPITAL_BASED_OUTPATIENT_CLINIC_OR_DEPARTMENT_OTHER): Payer: Self-pay | Admitting: *Deleted

## 2018-08-09 ENCOUNTER — Encounter: Payer: Self-pay | Admitting: Family Medicine

## 2018-08-09 ENCOUNTER — Telehealth: Payer: Self-pay | Admitting: *Deleted

## 2018-08-09 NOTE — Telephone Encounter (Signed)
Spoke to pt concerning CT and Bone scan contrast. Pt feels like "all that contrast is too much for me". Pt wishes to cancel bone scan at this time and will consider having it at a later date. Called central scheduling to cancel Referral placed for spiritual care. Pt very interested in mediation of the "opening heart region" and emotional healing.

## 2018-08-09 NOTE — Telephone Encounter (Signed)
Left vm regarding contrast for CT scan. Contact information provided.

## 2018-08-09 NOTE — Telephone Encounter (Signed)
R/s appt per 1/9 sch message - pt to get an updated schedule next visit.

## 2018-08-12 ENCOUNTER — Ambulatory Visit (HOSPITAL_COMMUNITY): Payer: BLUE CROSS/BLUE SHIELD

## 2018-08-12 ENCOUNTER — Ambulatory Visit (HOSPITAL_BASED_OUTPATIENT_CLINIC_OR_DEPARTMENT_OTHER): Payer: BLUE CROSS/BLUE SHIELD | Admitting: Certified Registered"

## 2018-08-12 ENCOUNTER — Encounter (HOSPITAL_BASED_OUTPATIENT_CLINIC_OR_DEPARTMENT_OTHER)
Admission: RE | Disposition: A | Discharge status: Home / Self Care | Payer: Self-pay | Source: Home / Self Care | Attending: General Surgery

## 2018-08-12 ENCOUNTER — Encounter (HOSPITAL_BASED_OUTPATIENT_CLINIC_OR_DEPARTMENT_OTHER): Payer: Self-pay | Admitting: *Deleted

## 2018-08-12 ENCOUNTER — Ambulatory Visit (HOSPITAL_BASED_OUTPATIENT_CLINIC_OR_DEPARTMENT_OTHER)
Admission: RE | Admit: 2018-08-12 | Discharge: 2018-08-12 | Disposition: A | Discharge status: Home / Self Care | Payer: BLUE CROSS/BLUE SHIELD | Attending: General Surgery | Admitting: General Surgery

## 2018-08-12 ENCOUNTER — Other Ambulatory Visit: Payer: Self-pay

## 2018-08-12 DIAGNOSIS — I10 Essential (primary) hypertension: Secondary | ICD-10-CM | POA: Diagnosis not present

## 2018-08-12 DIAGNOSIS — C50911 Malignant neoplasm of unspecified site of right female breast: Secondary | ICD-10-CM | POA: Diagnosis not present

## 2018-08-12 DIAGNOSIS — Z882 Allergy status to sulfonamides status: Secondary | ICD-10-CM | POA: Insufficient documentation

## 2018-08-12 DIAGNOSIS — F419 Anxiety disorder, unspecified: Secondary | ICD-10-CM | POA: Diagnosis not present

## 2018-08-12 DIAGNOSIS — F329 Major depressive disorder, single episode, unspecified: Secondary | ICD-10-CM | POA: Insufficient documentation

## 2018-08-12 DIAGNOSIS — C50411 Malignant neoplasm of upper-outer quadrant of right female breast: Secondary | ICD-10-CM | POA: Diagnosis not present

## 2018-08-12 DIAGNOSIS — Z95828 Presence of other vascular implants and grafts: Secondary | ICD-10-CM

## 2018-08-12 DIAGNOSIS — Z880 Allergy status to penicillin: Secondary | ICD-10-CM | POA: Insufficient documentation

## 2018-08-12 DIAGNOSIS — Z79899 Other long term (current) drug therapy: Secondary | ICD-10-CM | POA: Diagnosis not present

## 2018-08-12 DIAGNOSIS — J45909 Unspecified asthma, uncomplicated: Secondary | ICD-10-CM | POA: Diagnosis not present

## 2018-08-12 DIAGNOSIS — Z452 Encounter for adjustment and management of vascular access device: Secondary | ICD-10-CM | POA: Diagnosis not present

## 2018-08-12 DIAGNOSIS — Z419 Encounter for procedure for purposes other than remedying health state, unspecified: Secondary | ICD-10-CM

## 2018-08-12 HISTORY — DX: Major depressive disorder, single episode, unspecified: F32.9

## 2018-08-12 HISTORY — PX: PORTACATH PLACEMENT: SHX2246

## 2018-08-12 HISTORY — DX: Prediabetes: R73.03

## 2018-08-12 HISTORY — DX: Essential (primary) hypertension: I10

## 2018-08-12 HISTORY — DX: Depression, unspecified: F32.A

## 2018-08-12 SURGERY — INSERTION, TUNNELED CENTRAL VENOUS DEVICE, WITH PORT
Anesthesia: General | Site: Chest | Laterality: Left

## 2018-08-12 MED ORDER — BUPIVACAINE HCL (PF) 0.25 % IJ SOLN
INTRAMUSCULAR | Status: DC | PRN
  Administered 2018-08-12: 8 mL

## 2018-08-12 MED ORDER — LIDOCAINE HCL (CARDIAC) PF 100 MG/5ML IV SOSY: PREFILLED_SYRINGE | INTRAVENOUS | Status: DC | PRN

## 2018-08-12 MED ORDER — CHLORHEXIDINE GLUCONATE CLOTH 2 % EX PADS: 6.0000 | MEDICATED_PAD | Freq: Once | CUTANEOUS | Status: DC

## 2018-08-12 MED ORDER — MIDAZOLAM HCL 2 MG/2ML IJ SOLN
INTRAMUSCULAR | Status: AC
  Filled 2018-08-12: qty 2

## 2018-08-12 MED ORDER — ONDANSETRON HCL 4 MG/2ML IJ SOLN
INTRAMUSCULAR | Status: DC | PRN
  Administered 2018-08-12: 4 mg via INTRAVENOUS

## 2018-08-12 MED ORDER — VANCOMYCIN HCL IN DEXTROSE 1-5 GM/200ML-% IV SOLN
INTRAVENOUS | Status: AC
  Filled 2018-08-12: qty 200

## 2018-08-12 MED ORDER — GABAPENTIN 300 MG PO CAPS
300.0000 mg | ORAL_CAPSULE | ORAL | Status: AC
  Administered 2018-08-12: 300 mg via ORAL

## 2018-08-12 MED ORDER — FENTANYL CITRATE (PF) 100 MCG/2ML IJ SOLN
INTRAMUSCULAR | Status: AC
  Filled 2018-08-12: qty 2

## 2018-08-12 MED ORDER — OXYCODONE HCL 5 MG PO TABS: 5.0000 mg | ORAL_TABLET | Freq: Once | ORAL | Status: DC | PRN

## 2018-08-12 MED ORDER — HEPARIN (PORCINE) IN NACL 2-0.9 UNITS/ML
INTRAMUSCULAR | Status: AC | PRN
  Administered 2018-08-12: 20 mL

## 2018-08-12 MED ORDER — HEPARIN (PORCINE) IN NACL 1000-0.9 UT/500ML-% IV SOLN
INTRAVENOUS | Status: AC
  Filled 2018-08-12: qty 500

## 2018-08-12 MED ORDER — HYDROCODONE-ACETAMINOPHEN 5-325 MG PO TABS: 1.0000 | ORAL_TABLET | Freq: Four times a day (QID) | ORAL | 0 refills | Status: DC | PRN

## 2018-08-12 MED ORDER — HEPARIN SOD (PORK) LOCK FLUSH 100 UNIT/ML IV SOLN
INTRAVENOUS | Status: AC
  Filled 2018-08-12: qty 5

## 2018-08-12 MED ORDER — PROPOFOL 10 MG/ML IV BOLUS
INTRAVENOUS | Status: DC | PRN
  Administered 2018-08-12: 150 mg via INTRAVENOUS

## 2018-08-12 MED ORDER — FENTANYL CITRATE (PF) 100 MCG/2ML IJ SOLN
50.0000 ug | INTRAMUSCULAR | Status: DC | PRN
  Administered 2018-08-12: 100 ug via INTRAVENOUS

## 2018-08-12 MED ORDER — ACETAMINOPHEN 500 MG PO TABS
1000.0000 mg | ORAL_TABLET | ORAL | Status: AC
  Administered 2018-08-12: 1000 mg via ORAL

## 2018-08-12 MED ORDER — CELECOXIB 200 MG PO CAPS
ORAL_CAPSULE | ORAL | Status: AC
  Filled 2018-08-12: qty 1

## 2018-08-12 MED ORDER — HEPARIN SOD (PORK) LOCK FLUSH 100 UNIT/ML IV SOLN
INTRAVENOUS | Status: DC | PRN
  Administered 2018-08-12: 500 [IU]

## 2018-08-12 MED ORDER — LACTATED RINGERS IV SOLN
INTRAVENOUS | Status: DC
  Administered 2018-08-12 (×2): via INTRAVENOUS

## 2018-08-12 MED ORDER — DEXAMETHASONE SODIUM PHOSPHATE 10 MG/ML IJ SOLN
INTRAMUSCULAR | Status: DC | PRN
  Administered 2018-08-12: 10 mg via INTRAVENOUS

## 2018-08-12 MED ORDER — BUPIVACAINE HCL (PF) 0.25 % IJ SOLN
INTRAMUSCULAR | Status: AC
  Filled 2018-08-12: qty 30

## 2018-08-12 MED ORDER — VANCOMYCIN HCL IN DEXTROSE 1-5 GM/200ML-% IV SOLN
1000.0000 mg | INTRAVENOUS | Status: AC
  Administered 2018-08-12: 1000 mg via INTRAVENOUS

## 2018-08-12 MED ORDER — LIDOCAINE HCL (CARDIAC) PF 100 MG/5ML IV SOSY
PREFILLED_SYRINGE | INTRAVENOUS | Status: DC | PRN
  Administered 2018-08-12: 100 mg via INTRAVENOUS

## 2018-08-12 MED ORDER — FENTANYL CITRATE (PF) 100 MCG/2ML IJ SOLN: 25.0000 ug | INTRAMUSCULAR | Status: DC | PRN

## 2018-08-12 MED ORDER — CELECOXIB 200 MG PO CAPS
200.0000 mg | ORAL_CAPSULE | ORAL | Status: AC
  Administered 2018-08-12: 200 mg via ORAL

## 2018-08-12 MED ORDER — ACETAMINOPHEN 500 MG PO TABS
ORAL_TABLET | ORAL | Status: AC
  Filled 2018-08-12: qty 2

## 2018-08-12 MED ORDER — ONDANSETRON HCL 4 MG/2ML IJ SOLN: 4.0000 mg | Freq: Four times a day (QID) | INTRAMUSCULAR | Status: DC | PRN

## 2018-08-12 MED ORDER — LIDOCAINE 2% (20 MG/ML) 5 ML SYRINGE
INTRAMUSCULAR | Status: AC
  Filled 2018-08-12: qty 5

## 2018-08-12 MED ORDER — MIDAZOLAM HCL 2 MG/2ML IJ SOLN
1.0000 mg | INTRAMUSCULAR | Status: DC | PRN
  Administered 2018-08-12: 2 mg via INTRAVENOUS

## 2018-08-12 MED ORDER — SCOPOLAMINE 1 MG/3DAYS TD PT72: 1.0000 | MEDICATED_PATCH | Freq: Once | TRANSDERMAL | Status: DC | PRN

## 2018-08-12 MED ORDER — GABAPENTIN 300 MG PO CAPS
ORAL_CAPSULE | ORAL | Status: AC
  Filled 2018-08-12: qty 1

## 2018-08-12 MED ORDER — PROPOFOL 10 MG/ML IV BOLUS
INTRAVENOUS | Status: AC
  Filled 2018-08-12: qty 20

## 2018-08-12 MED ORDER — ONDANSETRON HCL 4 MG/2ML IJ SOLN
INTRAMUSCULAR | Status: AC
  Filled 2018-08-12: qty 2

## 2018-08-12 MED ORDER — OXYCODONE HCL 5 MG/5ML PO SOLN: 5.0000 mg | Freq: Once | ORAL | Status: DC | PRN

## 2018-08-12 MED ORDER — DEXAMETHASONE SODIUM PHOSPHATE 10 MG/ML IJ SOLN
INTRAMUSCULAR | Status: AC
  Filled 2018-08-12: qty 1

## 2018-08-12 SURGICAL SUPPLY — 46 items
BAG DECANTER FOR FLEXI CONT (MISCELLANEOUS) ×2 IMPLANT
BLADE SURG 15 STRL LF DISP TIS (BLADE) ×1 IMPLANT
BLADE SURG 15 STRL SS (BLADE) ×1
CANISTER SUCT 1200ML W/VALVE (MISCELLANEOUS) IMPLANT
CHLORAPREP W/TINT 26ML (MISCELLANEOUS) ×2 IMPLANT
CLEANER CAUTERY TIP 5X5 PAD (MISCELLANEOUS) ×1 IMPLANT
COVER BACK TABLE 60X90IN (DRAPES) ×2 IMPLANT
COVER MAYO STAND STRL (DRAPES) ×2 IMPLANT
COVER WAND RF STERILE (DRAPES) IMPLANT
DECANTER SPIKE VIAL GLASS SM (MISCELLANEOUS) IMPLANT
DERMABOND ADVANCED (GAUZE/BANDAGES/DRESSINGS) ×1
DERMABOND ADVANCED .7 DNX12 (GAUZE/BANDAGES/DRESSINGS) ×1 IMPLANT
DRAPE C-ARM 42X72 X-RAY (DRAPES) ×2 IMPLANT
DRAPE LAPAROSCOPIC ABDOMINAL (DRAPES) ×2 IMPLANT
DRAPE UTILITY XL STRL (DRAPES) ×2 IMPLANT
ELECT REM PT RETURN 9FT ADLT (ELECTROSURGICAL) ×2
ELECTRODE REM PT RTRN 9FT ADLT (ELECTROSURGICAL) ×1 IMPLANT
GLOVE BIO SURGEON STRL SZ7 (GLOVE) ×2 IMPLANT
GLOVE BIO SURGEON STRL SZ7.5 (GLOVE) ×2 IMPLANT
GLOVE BIOGEL PI IND STRL 7.5 (GLOVE) ×1 IMPLANT
GLOVE BIOGEL PI INDICATOR 7.5 (GLOVE) ×1
GOWN STRL REUS W/ TWL LRG LVL3 (GOWN DISPOSABLE) ×2 IMPLANT
GOWN STRL REUS W/TWL LRG LVL3 (GOWN DISPOSABLE) ×2
IV KIT MINILOC 20X1 SAFETY (NEEDLE) IMPLANT
KIT PORT POWER 8FR ISP CVUE (Port) ×2 IMPLANT
NDL SAFETY ECLIPSE 18X1.5 (NEEDLE) IMPLANT
NEEDLE HYPO 18GX1.5 SHARP (NEEDLE)
NEEDLE HYPO 22GX1.5 SAFETY (NEEDLE) IMPLANT
NEEDLE HYPO 25X1 1.5 SAFETY (NEEDLE) ×2 IMPLANT
NEEDLE SPNL 22GX3.5 QUINCKE BK (NEEDLE) IMPLANT
PACK BASIN DAY SURGERY FS (CUSTOM PROCEDURE TRAY) ×2 IMPLANT
PAD CLEANER CAUTERY TIP 5X5 (MISCELLANEOUS) ×1
PENCIL BUTTON HOLSTER BLD 10FT (ELECTRODE) ×2 IMPLANT
SLEEVE SCD COMPRESS KNEE MED (MISCELLANEOUS) IMPLANT
SUT MON AB 4-0 PC3 18 (SUTURE) ×2 IMPLANT
SUT PROLENE 2 0 SH DA (SUTURE) ×2 IMPLANT
SUT SILK 2 0 TIES 17X18 (SUTURE)
SUT SILK 2-0 18XBRD TIE BLK (SUTURE) IMPLANT
SUT VIC AB 3-0 SH 27 (SUTURE) ×1
SUT VIC AB 3-0 SH 27X BRD (SUTURE) ×1 IMPLANT
SYR 10ML LL (SYRINGE) IMPLANT
SYR 5ML LL (SYRINGE) ×2 IMPLANT
SYR CONTROL 10ML LL (SYRINGE) ×4 IMPLANT
TOWEL GREEN STERILE FF (TOWEL DISPOSABLE) ×4 IMPLANT
TUBE CONNECTING 20X1/4 (TUBING) IMPLANT
YANKAUER SUCT BULB TIP NO VENT (SUCTIONS) IMPLANT

## 2018-08-12 NOTE — Anesthesia Procedure Notes (Signed)
Procedure Name: LMA Insertion Performed by: Verita Lamb, CRNA Pre-anesthesia Checklist: Patient identified, Emergency Drugs available, Suction available, Patient being monitored and Timeout performed Patient Re-evaluated:Patient Re-evaluated prior to induction Oxygen Delivery Method: Circle system utilized Preoxygenation: Pre-oxygenation with 100% oxygen Induction Type: IV induction LMA: LMA inserted LMA Size: 4.0 Tube type: Oral Number of attempts: 1 Placement Confirmation: positive ETCO2,  CO2 detector and breath sounds checked- equal and bilateral Dental Injury: Teeth and Oropharynx as per pre-operative assessment

## 2018-08-12 NOTE — Anesthesia Preprocedure Evaluation (Signed)
Anesthesia Evaluation  Patient identified by MRN, date of birth, ID band Patient awake    Reviewed: Allergy & Precautions, H&P , NPO status , Patient's Chart, lab work & pertinent test results  Airway Mallampati: II   Neck ROM: full    Dental   Pulmonary asthma ,    breath sounds clear to auscultation       Cardiovascular hypertension,  Rhythm:regular Rate:Normal     Neuro/Psych PSYCHIATRIC DISORDERS Anxiety Depression    GI/Hepatic IBS   Endo/Other    Renal/GU      Musculoskeletal   Abdominal   Peds  Hematology   Anesthesia Other Findings   Reproductive/Obstetrics                             Anesthesia Physical Anesthesia Plan  ASA: II  Anesthesia Plan: General   Post-op Pain Management:    Induction: Intravenous  PONV Risk Score and Plan: 3 and Ondansetron, Dexamethasone, Midazolam and Treatment may vary due to age or medical condition  Airway Management Planned: LMA  Additional Equipment:   Intra-op Plan:   Post-operative Plan:   Informed Consent: I have reviewed the patients History and Physical, chart, labs and discussed the procedure including the risks, benefits and alternatives for the proposed anesthesia with the patient or authorized representative who has indicated his/her understanding and acceptance.     Plan Discussed with: CRNA, Anesthesiologist and Surgeon  Anesthesia Plan Comments:         Anesthesia Quick Evaluation

## 2018-08-12 NOTE — H&P (Signed)
[name]  Location: [facility] Patient #: [id number] DOB: 08-31-1955 Divorced / Language: Cleophus Molt / Race: White Female   History of Present Illness  The patient is a 63 year old female who presents with breast cancer. We are asked to see the patient in consultation by Dr. Jeanmarie Plant to evaluate her for a new right breast cancer. The patient is a 63 year old white female who presents with a palpable mass in the upper outer right breast. The patient states that this is been there for about a year. She did not get her mammogram last year. More recently she has noticed a little bit of retraction of her right nipple. On recent mammogram and ultrasound the patient was noted to have one 3 cm mass in the sort of central upper outer right breast as well as another 3 cm mass farther in the upper outer right breast. There were noted to be smaller masses between the 2. She also had an abnormal looking lymph node in the right axilla. One of the masses and nodes was an invasive breast cancer that was ER and PR positive and HER-2 negative with a Ki-67 of 80%. The second mass was ER positive and PR negative and HER-2 negative with a Ki-67 of 70%. She does not smoke. She does not report any family history of breast cancer.   Past Surgical History  No pertinent past surgical history   Diagnostic Studies History  Colonoscopy  never Mammogram  within last year  Allergies  Penicillins  Sulfa Antibiotics  Allergies Reconciled   Medication History  Budesonide (Inhalation) Specific strength unknown - Active. CeleXA (10MG Tablet, Oral) Active. Lisinopril (10MG Tablet, Oral) Active. Triamcinolone Acetonide (0.5% Cream, External) Active. Olopatadine HCl (0.1% Solution, Ophthalmic) Active. Claritin (10MG Capsule, Oral) Active. B & C B-Complex (Oral) Active. Garlic (Oral) Specific strength unknown - Active. Omega 3 (Oral) Specific strength unknown - Active. Vitamin C  (Oral) Specific strength unknown - Active. Medications Reconciled  Social History Alcohol use  Remotely quit alcohol use. Caffeine use  Tea. Illicit drug use  Prefer to discuss with provider. Tobacco use  Never smoker.  Family History Breast Cancer  Mother. Heart Disease  Father. Heart disease in female family member before age 46  Rectal Cancer  Son. Respiratory Condition  Mother.  Pregnancy / Birth History Age at menarche  59 years. Age of menopause  61-55 Gravida  3 Maternal age  42-20 Para  1  Other Problems  Anxiety Disorder  Asthma  Back Pain  Breast Cancer  Depression  High blood pressure  Lump In Breast     Review of Systems  General Present- Fatigue, Night Sweats and Weight Loss. Not Present- Appetite Loss, Chills, Fever and Weight Gain. Skin Present- Dryness. Not Present- Change in Wart/Mole, Hives, Jaundice, New Lesions, Non-Healing Wounds, Rash and Ulcer. HEENT Present- Ringing in the Ears, Seasonal Allergies and Wears glasses/contact lenses. Not Present- Earache, Hearing Loss, Hoarseness, Nose Bleed, Oral Ulcers, Sinus Pain, Sore Throat, Visual Disturbances and Yellow Eyes. Breast Present- Breast Mass and Skin Changes. Not Present- Breast Pain and Nipple Discharge. Cardiovascular Present- Palpitations. Not Present- Chest Pain, Difficulty Breathing Lying Down, Leg Cramps, Rapid Heart Rate, Shortness of Breath and Swelling of Extremities. Gastrointestinal Present- Change in Bowel Habits. Not Present- Abdominal Pain, Bloating, Bloody Stool, Chronic diarrhea, Constipation, Difficulty Swallowing, Excessive gas, Gets full quickly at meals, Hemorrhoids, Indigestion, Nausea, Rectal Pain and Vomiting. Musculoskeletal Present- Muscle Pain. Not Present- Back Pain, Joint Pain, Joint Stiffness, Muscle Weakness  and Swelling of Extremities. Neurological Present- Weakness. Not Present- Decreased Memory, Fainting, Headaches, Numbness, Seizures, Tingling,  Tremor and Trouble walking. Psychiatric Present- Anxiety and Fearful. Not Present- Bipolar, Change in Sleep Pattern, Depression and Frequent crying. Endocrine Present- Excessive Hunger. Not Present- Cold Intolerance, Hair Changes, Heat Intolerance, Hot flashes and New Diabetes. Hematology Not Present- Blood Thinners, Easy Bruising, Excessive bleeding, Gland problems, HIV and Persistent Infections.  Vitals Weight: 183.4 lb Height: 64in Body Surface Area: 1.89 m Body Mass Index: 31.48 kg/m  Temp.: 97.60F(Temporal)  Pulse: 94 (Regular)  P.OX: 99% (Room air) BP: 122/70 (Sitting, Left Arm, Standard)       Physical Exam General Mental Status-Alert. General Appearance-Consistent with stated age. Hydration-Well hydrated. Voice-Normal.  Head and Neck Head-normocephalic, atraumatic with no lesions or palpable masses. Trachea-midline. Thyroid Gland Characteristics - normal size and consistency.  Eye Eyeball - Bilateral-Extraocular movements intact. Sclera/Conjunctiva - Bilateral-No scleral icterus.  Chest and Lung Exam Chest and lung exam reveals -quiet, even and easy respiratory effort with no use of accessory muscles and on auscultation, normal breath sounds, no adventitious sounds and normal vocal resonance. Inspection Chest Wall - Normal. Back - normal.  Breast Note: There is a 3 cm palpable mass in the upper outer right breast as well as some palpable fullness more centrally in the right breast. There is a enlarged palpable lymph node in the right axilla. There is no palpable mass in the left breast. There is no palpable axillary, supra clavicular, or cervical lymphadenopathy on the left.   Cardiovascular Cardiovascular examination reveals -normal heart sounds, regular rate and rhythm with no murmurs and normal pedal pulses bilaterally.  Abdomen Inspection Inspection of the abdomen reveals - No Hernias. Skin - Scar - no surgical  scars. Palpation/Percussion Palpation and Percussion of the abdomen reveal - Soft, Non Tender, No Rebound tenderness, No Rigidity (guarding) and No hepatosplenomegaly. Auscultation Auscultation of the abdomen reveals - Bowel sounds normal.  Neurologic Neurologic evaluation reveals -alert and oriented x 3 with no impairment of recent or remote memory. Mental Status-Normal.  Musculoskeletal Normal Exam - Left-Upper Extremity Strength Normal and Lower Extremity Strength Normal. Normal Exam - Right-Upper Extremity Strength Normal and Lower Extremity Strength Normal.  Lymphatic Head & Neck  General Head & Neck Lymphatics: Bilateral - Description - Normal. Axillary  General Axillary Region: Bilateral - Description - Normal. Tenderness - Non Tender. Femoral & Inguinal  Generalized Femoral & Inguinal Lymphatics: Bilateral - Description - Normal. Tenderness - Non Tender.    Assessment & Plan  MALIGNANT NEOPLASM OF UPPER-OUTER QUADRANT OF RIGHT BREAST IN FEMALE, ESTROGEN RECEPTOR POSITIVE (C50.411) Impression: The patient appears to have a large area of cancer in the upper outer quadrant of the right breast with a positive lymph node. I have talked her in detail about the options for treatment. At this point I would recommend considering neoadjuvant therapy to try to shrink the cancer. If she agrees to this she would need a Port-A-Cath. I have discussed with her in detail the risks and benefits of the operation as well as some of the technical aspects and she understands. If she declines chemotherapy then she would likely need a modified radical mastectomy. I will go ahead and refer her to medical and radiation oncology today. We will set her up for an MRI study of both breasts. I will wait to hear back from her on her decision as to how she would like to proceed. Current Plans Referred to Oncology, for evaluation and follow up (Oncology).  Routine. Follow up with [place] in the office in 1  month.  Call us sooner as needed.  MRI BREAST BILATERAL ([id number])

## 2018-08-12 NOTE — Op Note (Signed)
08/12/2018  12:58 PM  PATIENT:  [name]  63 y.o. female  PRE-OPERATIVE DIAGNOSIS:  right breast cancer poor venous access  POST-OPERATIVE DIAGNOSIS:  right breast cancer poor venous access  PROCEDURE:  Procedure(s): INSERTION PORT-A-CATH (Left)  SURGEON:  Surgeon(s) and Role:    * [name], MD - Primary  PHYSICIAN ASSISTANT:   ASSISTANTS: none   ANESTHESIA:   local and general  EBL:  Minimal   BLOOD ADMINISTERED:none  DRAINS: none   LOCAL MEDICATIONS USED:  MARCAINE     SPECIMEN:  No Specimen  DISPOSITION OF SPECIMEN:  N/A  COUNTS:  YES  TOURNIQUET:  * No tourniquets in log *  DICTATION: .Dragon Dictation   After informed consent was obtained the patient was brought to the operating room and placed in the supine position on the operating table.  After adequate induction of general anesthesia a roll was placed between the patient's shoulder blades to extend the shoulder slightly.  The patient's left chest and neck area were then prepped with ChloraPrep, allowed to dry, and draped in usual sterile manner.  The patient was placed in Trendelenburg position.  The area lateral to the bend of the clavicle on the left chest wall was infiltrated with quarter percent Marcaine.  A large bore needle from the Port-A-Cath kit was used to slide beneath the bend of the clavicle heading towards the sternal notch and in doing so I was able to access the left subclavian vein without difficulty.  Wire was fed through the needle using the Seldinger technique without difficulty.  The wire was confirmed in the central venous system using real-time fluoroscopy.  Next a small incision was made at the wire entry site with a 15 blade knife.  The incision was carried through the skin and subcutaneous tissue sharply with electrocautery.  A subcutaneous pocket was then created inferior to the incision by blunt finger dissection.  Next the tubing was placed on the reservoir.  The reservoir was  placed in the pocket and the length of the tubing was estimated again using real-time fluoroscopy.  The tubing was cut to the appropriate length.  Next a sheath and dilator were fed over the wire also using the Seldinger technique without difficulty.  The dilator and wire were then removed from the patient.  The tubing was fed through the sheath as far as it would go and then held in place while the sheath was gently cracked and separated.  Another real-time fluoroscopy image showed the tip of the catheter to be in the distal superior vena cava.  The tubing was then permanently anchored to the reservoir.  The reservoir was anchored in the pocket with two 2-0 Prolene stitches.  The port was then aspirated and it aspirated blood easily.  The port was then flushed initially with a dilute heparin solution and then with a more concentrated heparin solution.  The subcutaneous tissue was then closed over the port with interrupted 3-0 Vicryl stitches.  The skin was then closed with a running 4-0 Monocryl subcuticular stitch.  Dermabond dressings were applied.  The patient tolerated the procedure well.  At the end of the case all needle sponge and instrument counts were correct.  The patient was then awakened and taken to recovery in stable condition.  PLAN OF CARE: Discharge to home after PACU  PATIENT DISPOSITION:  PACU - hemodynamically stable.   Delay start of Pharmacological VTE agent (>24hrs) due to surgical blood loss or risk of bleeding:  not applicable

## 2018-08-12 NOTE — Discharge Instructions (Signed)

## 2018-08-12 NOTE — Interval H&P Note (Signed)
History and Physical Interval Note:  08/12/2018 11:54 AM  [name]  has presented today for surgery, with the diagnosis of right breast cancer poor venous access  The various methods of treatment have been discussed with the patient and family. After consideration of risks, benefits and other options for treatment, the patient has consented to  Procedure(s): INSERTION PORT-A-CATH WITH ULTRASOUND (N/A) as a surgical intervention .  The patient's history has been reviewed, patient examined, no change in status, stable for surgery.  I have reviewed the patient's chart and labs.  Questions were answered to the patient's satisfaction.     [name]

## 2018-08-12 NOTE — Transfer of Care (Signed)
Immediate Anesthesia Transfer of Care Note  Patient: [name]  Procedure(s) Performed: INSERTION PORT-A-CATH (Left Chest)  Patient Location: PACU  Anesthesia Type:General  Level of Consciousness: awake, alert  and oriented  Airway & Oxygen Therapy: Patient Spontanous Breathing and Patient connected to face mask oxygen  Post-op Assessment: Report given to RN and Post -op Vital signs reviewed and stable  Post vital signs: Reviewed and stable  Last Vitals:  Vitals Value Taken Time  BP 125/55 08/12/2018 12:58 PM  Temp    Pulse 90 08/12/2018 12:59 PM  Resp 19 08/12/2018 12:59 PM  SpO2 100 % 08/12/2018 12:59 PM  Vitals shown include unvalidated device data.  Last Pain:  Vitals:   08/12/18 1045  TempSrc: Oral  PainSc: 0-No pain      Patients Stated Pain Goal: 3 (99/68/95 7022)  Complications: No apparent anesthesia complications

## 2018-08-13 ENCOUNTER — Encounter: Payer: Self-pay | Admitting: General Practice

## 2018-08-13 ENCOUNTER — Other Ambulatory Visit: Payer: Self-pay | Admitting: Hematology and Oncology

## 2018-08-13 ENCOUNTER — Encounter: Payer: Self-pay | Admitting: Family Medicine

## 2018-08-13 ENCOUNTER — Encounter: Payer: Self-pay | Admitting: *Deleted

## 2018-08-13 ENCOUNTER — Telehealth: Payer: Self-pay | Admitting: *Deleted

## 2018-08-13 ENCOUNTER — Other Ambulatory Visit: Payer: Self-pay

## 2018-08-13 ENCOUNTER — Encounter (HOSPITAL_BASED_OUTPATIENT_CLINIC_OR_DEPARTMENT_OTHER): Payer: Self-pay | Admitting: General Surgery

## 2018-08-13 DIAGNOSIS — C50411 Malignant neoplasm of upper-outer quadrant of right female breast: Secondary | ICD-10-CM

## 2018-08-13 DIAGNOSIS — Z170 Estrogen receptor positive status [ER+]: Secondary | ICD-10-CM

## 2018-08-13 MED ORDER — ONDANSETRON HCL 8 MG PO TABS: 8.0000 mg | ORAL_TABLET | Freq: Two times a day (BID) | ORAL | 1 refills | Status: DC | PRN

## 2018-08-13 MED ORDER — LORAZEPAM 0.5 MG PO TABS: 0.5000 mg | ORAL_TABLET | Freq: Every evening | ORAL | 0 refills | Status: DC | PRN

## 2018-08-13 MED ORDER — DEXAMETHASONE 4 MG PO TABS: 4.0000 mg | ORAL_TABLET | Freq: Every day | ORAL | 1 refills | Status: AC

## 2018-08-13 MED ORDER — LIDOCAINE-PRILOCAINE 2.5-2.5 % EX CREA: TOPICAL_CREAM | CUTANEOUS | 3 refills | Status: DC

## 2018-08-13 MED ORDER — PROCHLORPERAZINE MALEATE 10 MG PO TABS: 10.0000 mg | ORAL_TABLET | Freq: Four times a day (QID) | ORAL | 1 refills | Status: DC | PRN

## 2018-08-13 NOTE — Telephone Encounter (Signed)
Return pt call regarding contrast. Left vm for pt to return call. Contact information provided.

## 2018-08-13 NOTE — Progress Notes (Signed)
[facility] Spiritual Care Note  Referred by navigator Jessee Avers for emotional/spiritual support. Reached [place] briefly by phone this morning. Unable to reach her as planned this afternoon, LVM of availability, encouraging callback.   [place], [place], [facility] Pager [phone] Voicemail [phone]

## 2018-08-13 NOTE — Progress Notes (Signed)
START ON PATHWAY REGIMEN - Breast   Dose-Dense AC q14 days:   A cycle is every 14 days:     Doxorubicin      Cyclophosphamide      Pegfilgrastim-xxxx   **Always confirm dose/schedule in your pharmacy ordering system**  Paclitaxel 80 mg/m2 Weekly:   Administer weekly:     Paclitaxel   **Always confirm dose/schedule in your pharmacy ordering system**  Patient Characteristics: Preoperative or Nonsurgical Candidate (Clinical Staging), Neoadjuvant Therapy followed by Surgery, Invasive Disease, Chemotherapy, HER2 Negative/Unknown/Equivocal, ER Positive Therapeutic Status: Preoperative or Nonsurgical Candidate (Clinical Staging) AJCC M Category: cM0 AJCC Grade: G3 Breast Surgical Plan: Neoadjuvant Therapy followed by Surgery ER Status: Positive (+) AJCC 8 Stage Grouping: IIIA HER2 Status: Negative (-) AJCC T Category: cT2 AJCC N Category: cN1 PR Status: Negative (-) Intent of Therapy: Curative Intent, Discussed with Patient

## 2018-08-13 NOTE — Progress Notes (Signed)
[place] Work  Holiday representative received referral from medical oncology for emotional support and transportation needs,.  CSW contacted patient at home to offer support and assess for needs.  CSW left patient a messaging offering support and information on resources.  CSW encouraged patient to call back and further explore support options.  [name], MSW, LCSW, OSW-C Clinical Social Worker [facility] [phone]

## 2018-08-13 NOTE — Anesthesia Postprocedure Evaluation (Signed)
Anesthesia Post Note  Patient: [name]  Procedure(s) Performed: INSERTION PORT-A-CATH (Left Chest)     Patient location during evaluation: PACU Anesthesia Type: General Level of consciousness: awake and alert Pain management: pain level controlled Vital Signs Assessment: post-procedure vital signs reviewed and stable Respiratory status: spontaneous breathing, nonlabored ventilation, respiratory function stable and patient connected to nasal cannula oxygen Cardiovascular status: blood pressure returned to baseline and stable Postop Assessment: no apparent nausea or vomiting Anesthetic complications: no    Last Vitals:  Vitals:   08/12/18 1330 08/12/18 1415  BP: 123/65 (!) 144/65  Pulse: 69 65  Resp: (!) 24 18  Temp:  36.7 C  SpO2: 100% 100%    Last Pain:  Vitals:   08/12/18 1415  TempSrc:   PainSc: [place]

## 2018-08-14 ENCOUNTER — Inpatient Hospital Stay: Payer: BLUE CROSS/BLUE SHIELD

## 2018-08-14 ENCOUNTER — Telehealth: Payer: Self-pay | Admitting: General Practice

## 2018-08-14 ENCOUNTER — Encounter: Payer: Self-pay | Admitting: General Practice

## 2018-08-14 ENCOUNTER — Ambulatory Visit (HOSPITAL_COMMUNITY)
Admission: RE | Admit: 2018-08-14 | Discharge: 2018-08-14 | Disposition: A | Discharge status: Home / Self Care | Payer: BLUE CROSS/BLUE SHIELD | Source: Ambulatory Visit | Attending: Hematology and Oncology | Admitting: Hematology and Oncology

## 2018-08-14 ENCOUNTER — Other Ambulatory Visit (HOSPITAL_COMMUNITY): Payer: BLUE CROSS/BLUE SHIELD

## 2018-08-14 ENCOUNTER — Encounter (HOSPITAL_COMMUNITY): Payer: Self-pay | Admitting: Radiology

## 2018-08-14 ENCOUNTER — Telehealth: Payer: Self-pay | Admitting: *Deleted

## 2018-08-14 ENCOUNTER — Other Ambulatory Visit: Payer: BLUE CROSS/BLUE SHIELD

## 2018-08-14 DIAGNOSIS — Z170 Estrogen receptor positive status [ER+]: Secondary | ICD-10-CM

## 2018-08-14 DIAGNOSIS — C50911 Malignant neoplasm of unspecified site of right female breast: Secondary | ICD-10-CM | POA: Diagnosis not present

## 2018-08-14 DIAGNOSIS — C50411 Malignant neoplasm of upper-outer quadrant of right female breast: Secondary | ICD-10-CM | POA: Insufficient documentation

## 2018-08-14 LAB — CBC WITH DIFFERENTIAL (CANCER CENTER ONLY)
Abs Immature Granulocytes: 0.05 10*3/uL (ref 0.00–0.07)
BASOS PCT: 0 %
Basophils Absolute: 0 10*3/uL (ref 0.0–0.1)
Eosinophils Absolute: 0 10*3/uL (ref 0.0–0.5)
Eosinophils Relative: 0 %
HCT: 37.8 % (ref 36.0–46.0)
Hemoglobin: 12.3 g/dL (ref 12.0–15.0)
Immature Granulocytes: 1 %
Lymphocytes Relative: 18 %
Lymphs Abs: 1.4 10*3/uL (ref 0.7–4.0)
MCH: 28.7 pg (ref 26.0–34.0)
MCHC: 32.5 g/dL (ref 30.0–36.0)
MCV: 88.1 fL (ref 80.0–100.0)
Monocytes Absolute: 0.7 10*3/uL (ref 0.1–1.0)
Monocytes Relative: 9 %
Neutro Abs: 5.9 10*3/uL (ref 1.7–7.7)
Neutrophils Relative %: 72 %
Platelet Count: 219 10*3/uL (ref 150–400)
RBC: 4.29 MIL/uL (ref 3.87–5.11)
RDW: 13.3 % (ref 11.5–15.5)
WBC Count: 8.1 10*3/uL (ref 4.0–10.5)
nRBC: 0 % (ref 0.0–0.2)

## 2018-08-14 LAB — CMP (CANCER CENTER ONLY)
ALT: 12 U/L (ref 0–44)
ANION GAP: 7 (ref 5–15)
AST: 22 U/L (ref 15–41)
Albumin: 4 g/dL (ref 3.5–5.0)
Alkaline Phosphatase: 75 U/L (ref 38–126)
BUN: 18 mg/dL (ref 8–23)
CALCIUM: 9.3 mg/dL (ref 8.9–10.3)
CO2: 28 mmol/L (ref 22–32)
Chloride: 107 mmol/L (ref 98–111)
Creatinine: 0.82 mg/dL (ref 0.44–1.00)
GFR, Est AFR Am: >60 mL/min (ref >60)
GFR, Estimated: >60 mL/min (ref >60)
Glucose, Bld: 90 mg/dL (ref 70–99)
Potassium: 3.8 mmol/L (ref 3.5–5.1)
Sodium: 142 mmol/L (ref 135–145)
TOTAL PROTEIN: 6.9 g/dL (ref 6.5–8.1)
Total Bilirubin: 0.4 mg/dL (ref 0.3–1.2)

## 2018-08-14 MED ORDER — IOHEXOL 300 MG/ML  SOLN
100.0000 mL | Freq: Once | INTRAMUSCULAR | Status: AC | PRN
  Administered 2018-08-14: 100 mL via INTRAVENOUS

## 2018-08-14 MED ORDER — SODIUM CHLORIDE (PF) 0.9 % IJ SOLN
INTRAMUSCULAR | Status: AC
  Filled 2018-08-14: qty 50

## 2018-08-14 NOTE — Progress Notes (Signed)
[place] CSW Progress Notes  Call to patient, offered option of meeting after chemo education.  Patient declined stating she has to care for her small dog at home and wants to return home as soon as possible after class.  Lives alone, has little support locally.  Works from home.  Connected to long term supportive friend in [place], talks to her daily on phone.  Adult son, also a cancer survivor, lives in [place].  Considers herself a 'private person', would like as much privacy as possible in infusion room.  Prefers holistic approaches, but understands need for chemotherapy.  Encouraged to continue to use current coping mechanisms, reach out for support/resources as needed.    Kristin Shell, LCSW Clinical Social Worker Phone:  [phone]

## 2018-08-14 NOTE — Telephone Encounter (Signed)
[facility] CSW Progress Notes  Referral received from nurse navigator to reach out to patient for support/encouragement.  Left VM for patient w offer to meet after her upcoming Chemo Ed appt on 1/17 - asked patient to return call to let me know if she would like this.  Edwyna Shell, LCSW Clinical Social Worker Phone:  [phone]

## 2018-08-14 NOTE — Telephone Encounter (Signed)
Called pt with CT scan results, denies further questions or needs at this time. Physician team notified.

## 2018-08-16 ENCOUNTER — Ambulatory Visit (HOSPITAL_COMMUNITY)
Admission: RE | Admit: 2018-08-16 | Discharge: 2018-08-16 | Disposition: A | Discharge status: Home / Self Care | Payer: BLUE CROSS/BLUE SHIELD | Source: Ambulatory Visit | Attending: Hematology and Oncology | Admitting: Hematology and Oncology

## 2018-08-16 ENCOUNTER — Encounter: Payer: Self-pay | Admitting: Hematology and Oncology

## 2018-08-16 ENCOUNTER — Inpatient Hospital Stay: Payer: BLUE CROSS/BLUE SHIELD

## 2018-08-16 DIAGNOSIS — R002 Palpitations: Secondary | ICD-10-CM | POA: Diagnosis not present

## 2018-08-16 DIAGNOSIS — I10 Essential (primary) hypertension: Secondary | ICD-10-CM | POA: Diagnosis not present

## 2018-08-16 DIAGNOSIS — C50411 Malignant neoplasm of upper-outer quadrant of right female breast: Secondary | ICD-10-CM | POA: Insufficient documentation

## 2018-08-16 DIAGNOSIS — E785 Hyperlipidemia, unspecified: Secondary | ICD-10-CM | POA: Diagnosis not present

## 2018-08-16 DIAGNOSIS — Z170 Estrogen receptor positive status [ER+]: Secondary | ICD-10-CM | POA: Insufficient documentation

## 2018-08-16 DIAGNOSIS — E669 Obesity, unspecified: Secondary | ICD-10-CM | POA: Diagnosis not present

## 2018-08-16 NOTE — Progress Notes (Signed)
  Echocardiogram 2D Echocardiogram has been performed.  [name] 08/16/2018, 11:11 AM

## 2018-08-17 ENCOUNTER — Encounter: Payer: Self-pay | Admitting: Hematology and Oncology

## 2018-08-17 ENCOUNTER — Encounter: Payer: Self-pay | Admitting: Family Medicine

## 2018-08-19 ENCOUNTER — Telehealth: Payer: Self-pay | Admitting: *Deleted

## 2018-08-19 ENCOUNTER — Other Ambulatory Visit: Payer: BLUE CROSS/BLUE SHIELD

## 2018-08-19 ENCOUNTER — Encounter: Payer: Self-pay | Admitting: Family Medicine

## 2018-08-19 ENCOUNTER — Ambulatory Visit: Payer: BLUE CROSS/BLUE SHIELD | Admitting: Hematology and Oncology

## 2018-08-19 ENCOUNTER — Ambulatory Visit: Payer: BLUE CROSS/BLUE SHIELD

## 2018-08-19 NOTE — Telephone Encounter (Signed)
Pt called with concerns of added stress of driving to [place] for chemotherapy. Pt informed she lives a couple miles from [facility] in [place], has a friend that had Dr. Marilynn Rail as her medical oncologist. Discussed with pt that care out not be compromised if she went to [facility] and since she has support in [place] that transferring her care would be helpful as well. Pt relieved care would not be compromised and wishes to transfer medical oncology to Dr. Verdell Carmine. Called and received new pt appt for 1/22 at 7:45 to see Dr. Georgiann Cocker. Confirmed appt with pt.  Medical records faxed to [phone]  Physician team

## 2018-08-20 ENCOUNTER — Inpatient Hospital Stay: Payer: BLUE CROSS/BLUE SHIELD

## 2018-08-20 ENCOUNTER — Encounter: Payer: Self-pay | Admitting: *Deleted

## 2018-08-20 ENCOUNTER — Inpatient Hospital Stay: Payer: BLUE CROSS/BLUE SHIELD | Admitting: Hematology and Oncology

## 2018-08-21 DIAGNOSIS — Z6833 Body mass index (BMI) 33.0-33.9, adult: Secondary | ICD-10-CM | POA: Diagnosis not present

## 2018-08-21 DIAGNOSIS — Z5111 Encounter for antineoplastic chemotherapy: Secondary | ICD-10-CM | POA: Diagnosis not present

## 2018-08-21 DIAGNOSIS — C50811 Malignant neoplasm of overlapping sites of right female breast: Secondary | ICD-10-CM | POA: Diagnosis not present

## 2018-08-21 DIAGNOSIS — I10 Essential (primary) hypertension: Secondary | ICD-10-CM | POA: Diagnosis not present

## 2018-08-21 DIAGNOSIS — Z170 Estrogen receptor positive status [ER+]: Secondary | ICD-10-CM | POA: Diagnosis not present

## 2018-08-21 DIAGNOSIS — R768 Other specified abnormal immunological findings in serum: Secondary | ICD-10-CM | POA: Diagnosis not present

## 2018-08-21 DIAGNOSIS — F331 Major depressive disorder, recurrent, moderate: Secondary | ICD-10-CM | POA: Insufficient documentation

## 2018-08-21 DIAGNOSIS — F411 Generalized anxiety disorder: Secondary | ICD-10-CM | POA: Diagnosis not present

## 2018-08-22 ENCOUNTER — Encounter: Payer: Self-pay | Admitting: Family Medicine

## 2018-08-22 ENCOUNTER — Inpatient Hospital Stay: Payer: BLUE CROSS/BLUE SHIELD

## 2018-08-23 ENCOUNTER — Encounter: Payer: Self-pay | Admitting: *Deleted

## 2018-08-25 ENCOUNTER — Encounter: Payer: Self-pay | Admitting: Family Medicine

## 2018-08-27 ENCOUNTER — Other Ambulatory Visit: Payer: BLUE CROSS/BLUE SHIELD

## 2018-08-27 ENCOUNTER — Ambulatory Visit: Payer: BLUE CROSS/BLUE SHIELD | Admitting: Hematology and Oncology

## 2018-08-27 DIAGNOSIS — C50811 Malignant neoplasm of overlapping sites of right female breast: Secondary | ICD-10-CM | POA: Diagnosis not present

## 2018-08-27 DIAGNOSIS — Z170 Estrogen receptor positive status [ER+]: Secondary | ICD-10-CM | POA: Diagnosis not present

## 2018-08-27 DIAGNOSIS — Z5111 Encounter for antineoplastic chemotherapy: Secondary | ICD-10-CM | POA: Diagnosis not present

## 2018-08-28 ENCOUNTER — Encounter: Payer: Self-pay | Admitting: Family Medicine

## 2018-08-28 DIAGNOSIS — Z170 Estrogen receptor positive status [ER+]: Secondary | ICD-10-CM | POA: Diagnosis not present

## 2018-08-28 DIAGNOSIS — Z5111 Encounter for antineoplastic chemotherapy: Secondary | ICD-10-CM | POA: Diagnosis not present

## 2018-08-28 DIAGNOSIS — C50811 Malignant neoplasm of overlapping sites of right female breast: Secondary | ICD-10-CM | POA: Diagnosis not present

## 2018-08-29 ENCOUNTER — Encounter: Payer: Self-pay | Admitting: Family Medicine

## 2018-08-29 ENCOUNTER — Ambulatory Visit (INDEPENDENT_AMBULATORY_CARE_PROVIDER_SITE_OTHER): Payer: BLUE CROSS/BLUE SHIELD | Admitting: Family Medicine

## 2018-08-29 VITALS — BP 125/72 | HR 77 | Ht 64.0 in | Wt 184.0 lb

## 2018-08-29 DIAGNOSIS — Z5111 Encounter for antineoplastic chemotherapy: Secondary | ICD-10-CM | POA: Diagnosis not present

## 2018-08-29 DIAGNOSIS — C50411 Malignant neoplasm of upper-outer quadrant of right female breast: Secondary | ICD-10-CM

## 2018-08-29 DIAGNOSIS — Z170 Estrogen receptor positive status [ER+]: Secondary | ICD-10-CM | POA: Diagnosis not present

## 2018-08-29 DIAGNOSIS — I10 Essential (primary) hypertension: Secondary | ICD-10-CM | POA: Diagnosis not present

## 2018-08-29 DIAGNOSIS — C50811 Malignant neoplasm of overlapping sites of right female breast: Secondary | ICD-10-CM | POA: Diagnosis not present

## 2018-08-29 DIAGNOSIS — R739 Hyperglycemia, unspecified: Secondary | ICD-10-CM

## 2018-08-29 DIAGNOSIS — R7303 Prediabetes: Secondary | ICD-10-CM

## 2018-08-29 LAB — POCT GLYCOSYLATED HEMOGLOBIN (HGB A1C): Hemoglobin A1C: 6.3 % — AB (ref 4.0–5.6)

## 2018-08-29 NOTE — Patient Instructions (Addendum)
Thank you for coming in today. If you blood pressure remains low take 1/2 tab of lisinopril daily.   I will continue to follow.   I am here for you.   Eat what you can.

## 2018-08-29 NOTE — Progress Notes (Signed)
[name] is a 63 y.o. female who presents to [facility]: [facility] today for hypertension and discuss breast cancer.  [name] recently started presurgical chemotherapy for her breast cancer.  She takes medications listed below for hypertension.  She denies chest pain palpitations shortness of breath.  She does note a little bit of lightheadedness at times.  As noted above she had her first chemotherapy infusion this week.  She feels like she " got run over by a [employer] truck".  She has been prescribed Zofran for nausea and vomiting and dexamethasone for post treatment symptoms.  She obviously is scared and has some questions.  Prediabetes: [name] had labs checked with chemotherapy and her glucose was elevated.  She does have a history of prediabetes and is interested in hemoglobin A1c checked.  ROS as above:  Exam:  BP 125/72   Pulse 77   Ht 5\' 4"  (1.626 m)   Wt 184 lb (83.5 kg)   BMI 31.58 kg/m  Wt Readings from Last 5 Encounters:  08/29/18 184 lb (83.5 kg)  08/12/18 179 lb 7.3 oz (81.4 kg)  08/07/18 180 lb 4.8 oz (81.8 kg)  07/25/18 180 lb (81.6 kg)  07/15/18 189 lb (85.7 kg)    Gen: Well NAD HEENT: EOMI,  MMM Lungs: Normal work of breathing. CTABL Heart: RRR no MRG Abd: NABS, Soft. Nondistended, Nontender Exts: Brisk capillary refill, warm and well perfused.   Lab and Radiology Results Results for orders placed or performed in visit on 08/29/18 (from the past 72 hour(s))  POCT HgB A1C     Status: Abnormal   Collection Time: 08/29/18 10:02 AM  Result Value Ref Range   Hemoglobin A1C 6.3 (A) 4.0 - 5.6 %   HbA1c POC (<> result, manual entry)     HbA1c, POC (prediabetic range)     HbA1c, POC (controlled diabetic range)     No results found.    Assessment and Plan: 63 y.o. female with hypertension: Doing well.  Blood pressure at goal.  Discussed how to adjust  dosing as needed.  Recheck as needed.  Continue to follow metabolic panel and blood pressures with chemotherapy infusions.  Breast cancer: Doing well.  Discussed goals and strategies.  Reassured her that she is receiving good evidence-based care and doses of medications can be adjusted based on symptoms.  I discussed that her cancer is curable and because of her reasonable fundamental underlying good health her cancer doctors can be aggressive with medications to eradicate the cancer.  I stated that this is a good thing and that she is receiving great care.  I am here to answer questions and we will happily follow along.  Discussed additionally infection prevention.  Gave patient a box of facemasks.  Prediabetes: A1c elevated.  Discussed low carbohydrate foods.  Discussed balancing low-carb diet with adequate nutrition with nausea from chemotherapy.  PDMP not reviewed this encounter. Orders Placed This Encounter  Procedures  . POCT HgB A1C   No orders of the defined types were placed in this encounter.    Historical information moved to improve visibility of documentation.  Past Medical History:  Diagnosis Date  . Anxiety   . Asthma   . Chest pain, atypical   . Closed head injury 06/06/2017  . Depression   . Heart palpitations    hx of MVP  . Hypertension   . IBS (irritable bowel syndrome)   . Pre-diabetes  Past Surgical History:  Procedure Laterality Date  . eAB     x2   . PORTACATH PLACEMENT Left 08/12/2018   Procedure: INSERTION PORT-A-CATH;  Surgeon: [name], MD;  Location: [facility];  Service: General;  Laterality: Left;   Social History   Tobacco Use  . Smoking status: Never Smoker  . Smokeless tobacco: Never Used  . Tobacco comment: nonsmoker  Substance Use Topics  . Alcohol use: No   family history includes Heart attack in her father; Lung cancer in her mother.  Medications: Current Outpatient Medications  Medication Sig Dispense Refill    . aspirin 81 MG tablet Take 1 tablet (81 mg total) by mouth daily. 30 tablet   . B Complex-C (B-COMPLEX WITH VITAMIN C) tablet Take 1 tablet by mouth daily.      . budesonide-formoterol (SYMBICORT) 160-4.5 MCG/ACT inhaler Inhale 2 puffs into the lungs 2 (two) times daily. 3 Inhaler 1  . Calcium-Magnesium-Vitamin D (CALCIUM MAGNESIUM PO) Take by mouth.    . citalopram (CELEXA) 10 MG tablet Take 1 tablet (10 mg total) by mouth daily. 90 tablet 1  . dexamethasone (DECADRON) 4 MG tablet Take by mouth.    . Garlic Oil 1191 MG CAPS 1 capsule 2 (two) times daily.      Marland Kitchen HYDROcodone-acetaminophen (NORCO/VICODIN) 5-325 MG tablet Take 1-2 tablets by mouth every 6 (six) hours as needed for moderate pain or severe pain. 10 tablet 0  . HYDROcodone-acetaminophen (NORCO/VICODIN) 5-325 MG tablet Take 1-2 tablets by mouth every 6 (six) hours as needed for moderate pain or severe pain. 10 tablet 0  . ketoconazole (NIZORAL) 2 % cream Apply 1 application topically 2 (two) times daily. To affected areas. 60 g 3  . lidocaine-prilocaine (EMLA) cream Apply to affected area once 30 g 3  . loratadine (CLARITIN) 10 MG tablet Take 1 tablet (10 mg total) by mouth daily. 90 tablet 3  . LORazepam (ATIVAN) 0.5 MG tablet Take 1 tablet (0.5 mg total) by mouth at bedtime as needed (Nausea or vomiting). 30 tablet 0  . olopatadine (PATANOL) 0.1 % ophthalmic solution Place 1 drop into both eyes 2 (two) times daily as needed for allergies. 5 mL 12  . Omega-3 Fatty Acids (FISH OIL) 1000 MG CAPS Take 1 capsule by mouth daily.      . ondansetron (ZOFRAN) 8 MG tablet Take 1 tablet (8 mg total) by mouth 2 (two) times daily as needed. Start on the third day after chemotherapy. 30 tablet 1  . prochlorperazine (COMPAZINE) 10 MG tablet Take 1 tablet (10 mg total) by mouth every 6 (six) hours as needed (Nausea or vomiting). 30 tablet 1  . triamcinolone cream (KENALOG) 0.5 % Apply 1 application topically 2 (two) times daily. To affected areas.  454 g 3  . vitamin C (ASCORBIC ACID) 500 MG tablet Take 500 mg by mouth daily.      Marland Kitchen albuterol (PROAIR HFA) 108 (90 Base) MCG/ACT inhaler Inhale 2 puffs into the lungs every 6 (six) hours as needed for wheezing. 1 Inhaler 6  . irbesartan (AVAPRO) 75 MG tablet      No current facility-administered medications for this visit.    Allergies  Allergen Reactions  . Penicillins Shortness Of Breath    Reaction as a child  . Sulfa Antibiotics Nausea Only  . Effexor [Venlafaxine] Other (See Comments)    "dopey"     Discussed warning signs or symptoms. Please see discharge instructions. Patient expresses understanding.

## 2018-08-30 ENCOUNTER — Encounter: Payer: Self-pay | Admitting: Family Medicine

## 2018-09-03 ENCOUNTER — Other Ambulatory Visit: Payer: BLUE CROSS/BLUE SHIELD

## 2018-09-03 ENCOUNTER — Ambulatory Visit: Payer: BLUE CROSS/BLUE SHIELD

## 2018-09-03 DIAGNOSIS — C50411 Malignant neoplasm of upper-outer quadrant of right female breast: Secondary | ICD-10-CM | POA: Diagnosis not present

## 2018-09-03 DIAGNOSIS — Z170 Estrogen receptor positive status [ER+]: Secondary | ICD-10-CM | POA: Diagnosis not present

## 2018-09-04 DIAGNOSIS — Z6830 Body mass index (BMI) 30.0-30.9, adult: Secondary | ICD-10-CM | POA: Diagnosis not present

## 2018-09-04 DIAGNOSIS — C50811 Malignant neoplasm of overlapping sites of right female breast: Secondary | ICD-10-CM | POA: Diagnosis not present

## 2018-09-04 DIAGNOSIS — F331 Major depressive disorder, recurrent, moderate: Secondary | ICD-10-CM | POA: Diagnosis not present

## 2018-09-04 DIAGNOSIS — E6609 Other obesity due to excess calories: Secondary | ICD-10-CM | POA: Diagnosis not present

## 2018-09-05 ENCOUNTER — Encounter: Payer: Self-pay | Admitting: Family Medicine

## 2018-09-06 ENCOUNTER — Encounter: Payer: Self-pay | Admitting: Family Medicine

## 2018-09-07 ENCOUNTER — Encounter: Payer: Self-pay | Admitting: Emergency Medicine

## 2018-09-07 ENCOUNTER — Emergency Department (INDEPENDENT_AMBULATORY_CARE_PROVIDER_SITE_OTHER)
Admission: EM | Admit: 2018-09-07 | Discharge: 2018-09-07 | Disposition: A | Discharge status: Home / Self Care | Payer: BLUE CROSS/BLUE SHIELD | Source: Home / Self Care

## 2018-09-07 DIAGNOSIS — K120 Recurrent oral aphthae: Secondary | ICD-10-CM

## 2018-09-07 DIAGNOSIS — K1379 Other lesions of oral mucosa: Secondary | ICD-10-CM

## 2018-09-07 DIAGNOSIS — L03011 Cellulitis of right finger: Secondary | ICD-10-CM | POA: Diagnosis not present

## 2018-09-07 MED ORDER — MUPIROCIN 2 % EX OINT: TOPICAL_OINTMENT | CUTANEOUS | 0 refills | Status: DC

## 2018-09-07 MED ORDER — DOXYCYCLINE HYCLATE 100 MG PO CAPS: 100.0000 mg | ORAL_CAPSULE | Freq: Two times a day (BID) | ORAL | 0 refills | Status: DC

## 2018-09-07 NOTE — ED Triage Notes (Signed)
Patient c/o of gargling with hydrogen perxoide then drinking a ginger ale.  Patient having mouth burning and pain.

## 2018-09-07 NOTE — Discharge Instructions (Signed)
°  Keep skin sores clean with warm water and mild soap. You may soak your fingers in warm water and epson salt for 10 minutes at a time 2-3 times a day until sores begin to heal. Be sure to pat dry the area before applying the prescribed antibiotic ointment.  Please follow up with your family doctor next week for recheck of symptoms if not improving.

## 2018-09-07 NOTE — ED Provider Notes (Signed)
[name]    CSN: [id number] Arrival date & time: 09/07/18  0912     History   Chief Complaint Chief Complaint  Patient presents with  . burning in mouth    HPI [name] is a 63 y.o. female.   HPI  [name] is a 63 y.o. female presenting to UC with c/o mouth sores that she noticed last night after gargling some hydrogen peroxide. Pt has breast cancer, completed a round of chemo on 08/27/2018.  She has a habit of bite her lower lip and decided to use the peroxide to help her lip. She is concerned the chemo made her mouth more sensitive to the hydrogen peroxide.  Mild soreness on the back sides of her mouth but not in her throat. She also reports an infection of her Right middle and ring finger for about 3-4 days, she attributes to the chemo as well.  Her provider called in Cipro for her to take if she developed a fever after chemo but she has not had fever or chills so she never filled the medication.  She has not tried anything for the finger infections but thinks the ring finger needs to be lanced.   Past Medical History:  Diagnosis Date  . Anxiety   . Asthma   . Chest pain, atypical   . Closed head injury 06/06/2017  . Depression   . Heart palpitations    hx of MVP  . Hypertension   . IBS (irritable bowel syndrome)   . Pre-diabetes     Patient Active Problem List   Diagnosis Date Noted  . Malignant neoplasm of upper-outer quadrant of right breast in female, estrogen receptor positive ([place]) 07/25/2018  . Dysuria 04/30/2018  . Positive ANA (antinuclear antibody) 04/15/2018  . Elevated CK 04/15/2018  . Vitamin D deficiency 04/15/2018  . Stress reaction 04/15/2018  . Abnormal fasting glucose 10/11/2017  . Seborrheic dermatitis 07/19/2017  . Allergic conjunctivitis 07/19/2017  . Gastritis 06/27/2017  . BMI 33.0-33.9,adult 05/03/2017  . Palpitation 08/29/2016  . Insomnia 09/28/2015  . Menopausal symptom 05/12/2014  . Asthma, persistent controlled  02/26/2014  . Nummular eczema 02/26/2014  . Prediabetes 02/26/2014  . Hyperlipidemia 12/31/2013  . Essential hypertension 08/23/2012  . GAD (generalized anxiety disorder) 05/01/2012    Past Surgical History:  Procedure Laterality Date  . eAB     x2   . PORTACATH PLACEMENT Left 08/12/2018   Procedure: INSERTION PORT-A-CATH;  Surgeon: [name], MD;  Location: [facility];  Service: General;  Laterality: Left;    OB History    Gravida  3   Para  1   Term      Preterm      AB  2   Living        SAB      TAB  2   Ectopic      Multiple      Live Births               Home Medications    Prior to Admission medications   Medication Sig Start Date End Date Taking? Authorizing Provider  albuterol (PROAIR HFA) 108 (90 Base) MCG/ACT inhaler Inhale 2 puffs into the lungs every 6 (six) hours as needed for wheezing. 03/07/17 08/12/18  Gregor Hams, MD  aspirin 81 MG tablet Take 1 tablet (81 mg total) by mouth daily. 02/26/14   Hommel, Hilliard Clark, DO  B Complex-C (B-COMPLEX WITH VITAMIN C) tablet  Take 1 tablet by mouth daily.      [provider]  budesonide-formoterol (SYMBICORT) 160-4.5 MCG/ACT inhaler Inhale 2 puffs into the lungs 2 (two) times daily. 07/02/18 09/30/18  Gregor Hams, MD  Calcium-Magnesium-Vitamin D (CALCIUM MAGNESIUM PO) Take by mouth.    [provider]  citalopram (CELEXA) 10 MG tablet Take 1 tablet (10 mg total) by mouth daily. 07/02/18   Gregor Hams, MD  dexamethasone (DECADRON) 4 MG tablet Take by mouth. 08/13/18   [provider]  doxycycline (VIBRAMYCIN) 100 MG capsule Take 1 capsule (100 mg total) by mouth 2 (two) times daily. One po bid x 7 days 09/07/18   Noe Gens, PA-C  Garlic Oil 3500 MG CAPS 1 capsule 2 (two) times daily.      [provider]  HYDROcodone-acetaminophen (NORCO/VICODIN) 5-325 MG tablet Take 1-2 tablets by mouth every 6 (six) hours as needed for moderate pain or severe pain.  08/12/18   [name], MD  HYDROcodone-acetaminophen (NORCO/VICODIN) 5-325 MG tablet Take 1-2 tablets by mouth every 6 (six) hours as needed for moderate pain or severe pain. 08/12/18   [name], MD  irbesartan Levy Sjogren) 75 MG tablet  08/12/18   [provider]  ketoconazole (NIZORAL) 2 % cream Apply 1 application topically 2 (two) times daily. To affected areas. 07/19/17   Gregor Hams, MD  lidocaine-prilocaine (EMLA) cream Apply to affected area once 08/13/18   Nicholas Lose, MD  loratadine (CLARITIN) 10 MG tablet Take 1 tablet (10 mg total) by mouth daily. 12/28/17   Silverio Decamp, MD  LORazepam (ATIVAN) 0.5 MG tablet Take 1 tablet (0.5 mg total) by mouth at bedtime as needed (Nausea or vomiting). 08/13/18   Nicholas Lose, MD  mupirocin ointment (BACTROBAN) 2 % Apply to wound 3 times daily for 5 days 09/07/18   Noe Gens, PA-C  olopatadine (PATANOL) 0.1 % ophthalmic solution Place 1 drop into both eyes 2 (two) times daily as needed for allergies. 02/09/18   Scot Jun, FNP  Omega-3 Fatty Acids (FISH OIL) 1000 MG CAPS Take 1 capsule by mouth daily.      [provider]  ondansetron (ZOFRAN) 8 MG tablet Take 1 tablet (8 mg total) by mouth 2 (two) times daily as needed. Start on the third day after chemotherapy. 08/13/18   Nicholas Lose, MD  prochlorperazine (COMPAZINE) 10 MG tablet Take 1 tablet (10 mg total) by mouth every 6 (six) hours as needed (Nausea or vomiting). 08/13/18   Nicholas Lose, MD  triamcinolone cream (KENALOG) 0.5 % Apply 1 application topically 2 (two) times daily. To affected areas. 07/02/18   Gregor Hams, MD  vitamin C (ASCORBIC ACID) 500 MG tablet Take 500 mg by mouth daily.      [provider]    Family History Family History  Problem Relation Age of Onset  . Heart attack Father   . Lung cancer Mother     Social History Social History   Tobacco Use  . Smoking status: Never Smoker  . Smokeless tobacco: Never Used  .  Tobacco comment: nonsmoker  Substance Use Topics  . Alcohol use: No  . Drug use: No     Allergies   Penicillins; Sulfa antibiotics; and Effexor [venlafaxine]   Review of Systems Review of Systems  Constitutional: Negative for chills and fever.  HENT: Positive for mouth sores. Negative for sore throat, trouble swallowing and voice change.   Skin: Positive for color  change and wound.     Physical Exam Triage Vital Signs ED Triage Vitals  Enc Vitals Group     BP      Pulse      Resp      Temp      Temp src      SpO2      Weight      Height      Head Circumference      Peak Flow      Pain Score      Pain Loc      Pain Edu?      Excl. in [place]?    No data found.  Updated Vital Signs BP 105/72 (BP Location: Right Arm)   Pulse 81   Temp 98.4 F (36.9 C) (Oral)   Ht 5\' 4"  (1.626 m)   Wt 177 lb 8 oz (80.5 kg)   SpO2 100%   BMI 30.47 kg/m   Visual Acuity Right Eye Distance:   Left Eye Distance:   Bilateral Distance:    Right Eye Near:   Left Eye Near:    Bilateral Near:     Physical Exam Vitals signs and nursing note reviewed.  Constitutional:      Appearance: Normal appearance. She is well-developed.  HENT:     Head: Normocephalic and atraumatic.     Mouth/Throat:      Comments: Mild erythema bilaterally on posterior mouth. No ulcerations in back of mouth. Normal pharynx  Tongue: two shallow ulcerations on Left side.  Neck:     Musculoskeletal: Normal range of motion.  Cardiovascular:     Rate and Rhythm: Normal rate.  Pulmonary:     Effort: Pulmonary effort is normal. No respiratory distress.     Breath sounds: No stridor.  Musculoskeletal: Normal range of motion.     Comments: Right hand: full ROM all fingers.   Skin:    General: Skin is warm and dry.     Capillary Refill: Capillary refill takes less than 2 seconds.     Comments: Right middle finger, proximal radial aspect: 1cm area of erythema, centralized superficial scab. Tender.  No  fluctuance. Right ring finger, distal aspect: erythema, edema with pustule along radial aspect of nailbed.  Neurological:     Mental Status: She is alert and oriented to person, place, and time.  Psychiatric:        Behavior: Behavior normal.      UC Treatments / Results  Labs (all labs ordered are listed, but only abnormal results are displayed) Labs Reviewed  WOUND CULTURE    EKG None  Radiology No results found.  Procedures Incision and Drainage Date/Time: 09/07/2018 10:44 AM Performed by: Noe Gens, PA-C Authorized by: Noe Gens, PA-C   Consent:    Consent obtained:  Verbal   Consent given by:  Patient   Risks discussed:  Infection, bleeding, incomplete drainage and pain   Alternatives discussed:  No treatment and delayed treatment Location:    Type:  Abscess   Size:  0.5cm   Location:  Upper extremity   Upper extremity location:  Finger   Finger location:  R ring finger Pre-procedure details:    Skin preparation:  Antiseptic wash Anesthesia (see MAR for exact dosages):    Anesthesia method:  None Procedure type:    Complexity:  Simple Procedure details:    Incision types:  Stab incision   Incision depth:  Subcutaneous   Scalpel size: 18gtt needle.  Drainage:  Purulent   Drainage amount:  Scant   Wound treatment: bacitracin and bandage applied.   Packing materials:  None Post-procedure details:    Patient tolerance of procedure:  Tolerated well, no immediate complications   (including critical care time)  Medications Ordered in UC Medications - No data to display  Initial Impression / Assessment and Plan / UC Course  I have reviewed the triage vital signs and the nursing notes.  Pertinent labs & imaging results that were available during my care of the patient were reviewed by me and considered in my medical decision making (see chart for details).     Posterior mouth pain- appears to be mild mucosa irritation w/o ulcerations or  definite chemical burn. Reassured pt.  Two canker sores noted. Dicussed Magic Mouthwash, pt hesitant to numb entire mouth, will try Cepacol lozenges first.  Paronychia I&D successful, wound culture sent Will start pt on mupirocin and doxycycline, pt allergic to PCN- SOB  Final Clinical Impressions(s) / UC Diagnoses   Final diagnoses:  Canker sores oral  Mouth pain  Paronychia of right ring finger  Cellulitis of right middle finger     Discharge Instructions      Keep skin sores clean with warm water and mild soap. You may soak your fingers in warm water and epson salt for 10 minutes at a time 2-3 times a day until sores begin to heal. Be sure to pat dry the area before applying the prescribed antibiotic ointment.  Please follow up with your family doctor next week for recheck of symptoms if not improving.     ED Prescriptions    Medication Sig Dispense Auth. Provider   doxycycline (VIBRAMYCIN) 100 MG capsule Take 1 capsule (100 mg total) by mouth 2 (two) times daily. One po bid x 7 days 14 capsule Gerarda Fraction, Shion Bluestein O, PA-C   mupirocin ointment (BACTROBAN) 2 % Apply to wound 3 times daily for 5 days 30 g Noe Gens, [place]     Controlled Substance Prescriptions [place] Controlled Substance Registry consulted? Not Applicable   Tyrell Antonio 09/07/18 1047

## 2018-09-09 ENCOUNTER — Ambulatory Visit: Payer: BLUE CROSS/BLUE SHIELD | Admitting: Obstetrics & Gynecology

## 2018-09-09 DIAGNOSIS — I10 Essential (primary) hypertension: Secondary | ICD-10-CM | POA: Diagnosis not present

## 2018-09-09 DIAGNOSIS — R768 Other specified abnormal immunological findings in serum: Secondary | ICD-10-CM | POA: Diagnosis not present

## 2018-09-09 DIAGNOSIS — F331 Major depressive disorder, recurrent, moderate: Secondary | ICD-10-CM | POA: Diagnosis not present

## 2018-09-09 DIAGNOSIS — C50811 Malignant neoplasm of overlapping sites of right female breast: Secondary | ICD-10-CM | POA: Diagnosis not present

## 2018-09-09 DIAGNOSIS — F411 Generalized anxiety disorder: Secondary | ICD-10-CM | POA: Diagnosis not present

## 2018-09-10 ENCOUNTER — Ambulatory Visit: Payer: Self-pay | Admitting: General Surgery

## 2018-09-10 ENCOUNTER — Telehealth: Payer: Self-pay

## 2018-09-10 ENCOUNTER — Encounter: Payer: Self-pay | Admitting: *Deleted

## 2018-09-10 LAB — WOUND CULTURE
MICRO NUMBER:: 171254
SPECIMEN QUALITY:: ADEQUATE

## 2018-09-10 NOTE — Telephone Encounter (Signed)
Left message with lab results and contact information for any questions or concerns. 

## 2018-09-11 DIAGNOSIS — C50811 Malignant neoplasm of overlapping sites of right female breast: Secondary | ICD-10-CM | POA: Diagnosis not present

## 2018-09-11 DIAGNOSIS — Z5111 Encounter for antineoplastic chemotherapy: Secondary | ICD-10-CM | POA: Diagnosis not present

## 2018-09-11 DIAGNOSIS — F331 Major depressive disorder, recurrent, moderate: Secondary | ICD-10-CM | POA: Diagnosis not present

## 2018-09-11 DIAGNOSIS — E6609 Other obesity due to excess calories: Secondary | ICD-10-CM | POA: Diagnosis not present

## 2018-09-11 DIAGNOSIS — Z6830 Body mass index (BMI) 30.0-30.9, adult: Secondary | ICD-10-CM | POA: Diagnosis not present

## 2018-09-12 ENCOUNTER — Telehealth: Payer: Self-pay | Admitting: *Deleted

## 2018-09-12 NOTE — Telephone Encounter (Signed)
Pt called to discuss plan to forgo further chemotherapy and proceed with surgery. Physician team notified. Gave emotional support and encouraged pt to focus on next step in her treatment plan as she has decided to do.

## 2018-09-13 ENCOUNTER — Encounter: Payer: Self-pay | Admitting: Family Medicine

## 2018-09-16 ENCOUNTER — Ambulatory Visit: Payer: BLUE CROSS/BLUE SHIELD | Admitting: Obstetrics & Gynecology

## 2018-09-17 ENCOUNTER — Other Ambulatory Visit: Payer: BLUE CROSS/BLUE SHIELD

## 2018-09-17 ENCOUNTER — Ambulatory Visit: Payer: BLUE CROSS/BLUE SHIELD

## 2018-09-18 ENCOUNTER — Encounter: Payer: Self-pay | Admitting: Family Medicine

## 2018-09-18 ENCOUNTER — Ambulatory Visit (INDEPENDENT_AMBULATORY_CARE_PROVIDER_SITE_OTHER): Payer: BLUE CROSS/BLUE SHIELD | Admitting: Family Medicine

## 2018-09-18 VITALS — BP 141/55 | HR 91 | Ht 64.0 in | Wt 176.0 lb

## 2018-09-18 DIAGNOSIS — Z170 Estrogen receptor positive status [ER+]: Secondary | ICD-10-CM

## 2018-09-18 DIAGNOSIS — C50411 Malignant neoplasm of upper-outer quadrant of right female breast: Secondary | ICD-10-CM

## 2018-09-18 DIAGNOSIS — H04123 Dry eye syndrome of bilateral lacrimal glands: Secondary | ICD-10-CM | POA: Diagnosis not present

## 2018-09-18 DIAGNOSIS — L821 Other seborrheic keratosis: Secondary | ICD-10-CM

## 2018-09-18 NOTE — Patient Instructions (Signed)
Thank you for coming in today. Use artificial tear or lubricating eye drops.   I will keep an eye on the skin.  I think that is Seborrheic Keratosis    Seborrheic Keratosis A seborrheic keratosis is a common, noncancerous (benign) skin growth. These growths are velvety, waxy, rough, tan, brown, or black spots that appear on the skin. These skin growths can be flat or raised, and scaly. What are the causes? The cause of this condition is not known. What increases the risk? You are more likely to develop this condition if you:  Have a family history of seborrheic keratosis.  Are 50 or older.  Are pregnant.  Have had estrogen replacement therapy. What are the signs or symptoms? Symptoms of this condition include growths on the face, chest, shoulders, back, or other areas. These growths:  Are usually painless, but may become irritated and itchy.  Can be yellow, brown, black, or other colors.  Are slightly raised or have a flat surface.  Are sometimes rough or wart-like in texture.  Are often velvety or waxy on the surface.  Are round or oval-shaped.  Often occur in groups, but may occur as a single growth. How is this diagnosed? This condition is diagnosed with a medical history and physical exam.  A sample of the growth may be tested (skin biopsy).  You may need to see a skin specialist (dermatologist). How is this treated? Treatment is not usually needed for this condition, unless the growths are irritated or bleed often.  You may also choose to have the growths removed if you do not like their appearance. ? Most commonly, these growths are treated with a procedure in which liquid nitrogen is applied to "freeze" off the growth (cryosurgery). ? They may also be burned off with electricity (electrocautery) or removed by scraping (curettage). Follow these instructions at home:  Watch your growth for any changes.  Keep all follow-up visits as told by your health care  provider. This is important.  Do not scratch or pick at the growth or growths. This can cause them to become irritated or infected. Contact a health care provider if:  You suddenly have many new growths.  Your growth bleeds, itches, or hurts.  Your growth suddenly becomes larger or changes color. Summary  A seborrheic keratosis is a common, noncancerous (benign) skin growth.  Treatment is not usually needed for this condition, unless the growths are irritated or bleed often.  Watch your growth for any changes.  Contact a health care provider if you suddenly have many new growths or your growth suddenly becomes larger or changes color.  Keep all follow-up visits as told by your health care provider. This is important. This information is not intended to replace advice given to you by your health care provider. Make sure you discuss any questions you have with your health care provider. Document Released: 08/19/2010 Document Revised: 11/29/2017 Document Reviewed: 11/29/2017 Elsevier Interactive Patient Education  2019 [employer].

## 2018-09-18 NOTE — Progress Notes (Signed)
[name] is a 63 y.o. female who presents to [facility] today for skin lesion on face, breast cancer,  Fortunata notes a long history of a slightly dark macular lesion on her left face.  Is been present and unchanged for years.  She notes following her chemotherapy it is faded a bit and she is worried it may be changing.  She notes is not particularly bothersome.  Additionally she notes some dry eyes.  She has been using some leftover steroid eyedrops which help.  She notes this is been more bothersome following her chemotherapy as well.  Curtisha additionally has been treated for her right breast cancer.  She had 1 round of chemotherapy and had obnoxious side effects and chose to proceed with surgery first instead of doing neoadjuvant chemotherapy.  She notes that her surgery is now scheduled for a mastectomy March 12.  ROS as above:  Exam:  BP (!) 141/55   Pulse 91   Ht 5\' 4"  (1.626 m)   Wt 176 lb (79.8 kg)   BMI 30.21 kg/m  Wt Readings from Last 5 Encounters:  09/18/18 176 lb (79.8 kg)  09/07/18 177 lb 8 oz (80.5 kg)  08/29/18 184 lb (83.5 kg)  08/12/18 179 lb 7.3 oz (81.4 kg)  08/07/18 180 lb 4.8 oz (81.8 kg)    Gen: Well NAD HEENT: EOMI,  MMM no significant conjunctival injection or erythema.  No discharge present. Lungs: Normal work of breathing. CTABL Heart: RRR no MRG Abd: NABS, Soft. Nondistended, Nontender Exts: Brisk capillary refill, warm and well perfused.  Skin: Macular slightly hyperpigmented about 1 cm ovoid lesion left face.  Nontender.      Lab and Radiology Results No results found for this or any previous visit (from the past 72 hour(s)). No results found.    Assessment and Plan: 63 y.o. 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  Not surprising that it changed after chemotherapy.  Watchful waiting.  Dry eyes:  Recommend over-the-counter lubricating eyedrops such as Systane artificial tears.  Breast cancer: Patient is discontinuing neoadjuvant chemotherapy and proceeding with double mastectomy.  Recommend continuing her aromatase inhibitor however.  Follow-up in about a month after surgery.  Return sooner if needed.  Lengthy discussion today about treatment options and the importance of maintaining good continuity and follow-up with her oncology team.  PDMP not reviewed this encounter. No orders of the defined types were placed in this encounter.  No orders of the defined types were placed in this encounter.    Historical information moved to improve visibility of documentation.  Past Medical History:  Diagnosis Date  . Anxiety   . Asthma   . Chest pain, atypical   . Closed head injury 06/06/2017  . Depression   . Heart palpitations    hx of MVP  . Hypertension   . IBS (irritable bowel syndrome)   . Pre-diabetes    Past Surgical History:  Procedure Laterality Date  . eAB     x2   . PORTACATH PLACEMENT Left 08/12/2018   Procedure: INSERTION PORT-A-CATH;  Surgeon: [name], MD;  Location: [facility];  Service: General;  Laterality: Left;   Social History   Tobacco Use  . Smoking status: Never Smoker  . Smokeless tobacco: Never Used  . Tobacco comment: nonsmoker  Substance Use Topics  . Alcohol use: No   family history includes Heart attack in  her father; Lung cancer in her mother.  Medications: Current Outpatient Medications  Medication Sig Dispense Refill  . aspirin 81 MG tablet Take 1 tablet (81 mg total) by mouth daily. 30 tablet   . B Complex-C (B-COMPLEX WITH VITAMIN C) tablet Take 1 tablet by mouth daily.      . budesonide-formoterol (SYMBICORT) 160-4.5 MCG/ACT inhaler Inhale 2 puffs into the lungs 2 (two) times daily. 3 Inhaler 1  . Calcium-Magnesium-Vitamin D (CALCIUM MAGNESIUM PO) Take by mouth.    . citalopram (CELEXA) 10 MG tablet Take 1 tablet  (10 mg total) by mouth daily. 90 tablet 1  . dexamethasone (DECADRON) 4 MG tablet Take by mouth.    . doxycycline (VIBRAMYCIN) 100 MG capsule Take 1 capsule (100 mg total) by mouth 2 (two) times daily. One po bid x 7 days 14 capsule 0  . Garlic Oil 3149 MG CAPS 1 capsule 2 (two) times daily.      Marland Kitchen HYDROcodone-acetaminophen (NORCO/VICODIN) 5-325 MG tablet Take 1-2 tablets by mouth every 6 (six) hours as needed for moderate pain or severe pain. 10 tablet 0  . HYDROcodone-acetaminophen (NORCO/VICODIN) 5-325 MG tablet Take 1-2 tablets by mouth every 6 (six) hours as needed for moderate pain or severe pain. 10 tablet 0  . irbesartan (AVAPRO) 75 MG tablet     . ketoconazole (NIZORAL) 2 % cream Apply 1 application topically 2 (two) times daily. To affected areas. 60 g 3  . letrozole (FEMARA) 2.5 MG tablet Take 1 tablet by mouth daily.    Marland Kitchen lidocaine-prilocaine (EMLA) cream Apply to affected area once 30 g 3  . loratadine (CLARITIN) 10 MG tablet Take 1 tablet (10 mg total) by mouth daily. 90 tablet 3  . LORazepam (ATIVAN) 0.5 MG tablet Take 1 tablet (0.5 mg total) by mouth at bedtime as needed (Nausea or vomiting). 30 tablet 0  . mupirocin ointment (BACTROBAN) 2 % Apply to wound 3 times daily for 5 days 30 g 0  . olopatadine (PATANOL) 0.1 % ophthalmic solution Place 1 drop into both eyes 2 (two) times daily as needed for allergies. 5 mL 12  . Omega-3 Fatty Acids (FISH OIL) 1000 MG CAPS Take 1 capsule by mouth daily.      . ondansetron (ZOFRAN) 8 MG tablet Take 1 tablet (8 mg total) by mouth 2 (two) times daily as needed. Start on the third day after chemotherapy. 30 tablet 1  . prochlorperazine (COMPAZINE) 10 MG tablet Take 1 tablet (10 mg total) by mouth every 6 (six) hours as needed (Nausea or vomiting). 30 tablet 1  . triamcinolone cream (KENALOG) 0.5 % Apply 1 application topically 2 (two) times daily. To affected areas. 454 g 3  . vitamin C (ASCORBIC ACID) 500 MG tablet Take 500 mg by mouth daily.       Marland Kitchen albuterol (PROAIR HFA) 108 (90 Base) MCG/ACT inhaler Inhale 2 puffs into the lungs every 6 (six) hours as needed for wheezing. 1 Inhaler 6   No current facility-administered medications for this visit.    Allergies  Allergen Reactions  . Penicillins Shortness Of Breath    Reaction as a child  . Sulfa Antibiotics Nausea Only  . Effexor [Venlafaxine] Other (See Comments)    "dopey"     Discussed warning signs or symptoms. Please see discharge instructions. Patient expresses understanding.

## 2018-09-24 ENCOUNTER — Encounter: Payer: Self-pay | Admitting: *Deleted

## 2018-09-26 ENCOUNTER — Encounter: Payer: Self-pay | Admitting: Family Medicine

## 2018-09-30 ENCOUNTER — Encounter: Payer: Self-pay | Admitting: Family Medicine

## 2018-10-01 ENCOUNTER — Encounter: Payer: Self-pay | Admitting: *Deleted

## 2018-10-01 DIAGNOSIS — Z170 Estrogen receptor positive status [ER+]: Secondary | ICD-10-CM | POA: Diagnosis not present

## 2018-10-01 DIAGNOSIS — C50411 Malignant neoplasm of upper-outer quadrant of right female breast: Secondary | ICD-10-CM | POA: Diagnosis not present

## 2018-10-02 NOTE — Patient Instructions (Signed)
[name]  10/02/2018   Your procedure is scheduled on: Thursday 10/10/2018  Report to [facility] Main  Entrance              Report to admitting at   0600  AM    Call this number if you have problems the morning of surgery [phone]    Remember: Do not eat food  :After Midnight.   NO SOLID FOOD AFTER MIDNIGHT THE NIGHT PRIOR TO SURGERY. NOTHING BY MOUTH EXCEPT CLEAR LIQUIDS UNTIL 3 HOURS PRIOR TO [facility] SURGERY. PLEASE FINISH ENSURE DRINK PER SURGEON ORDER 3 HOURS PRIOR TO SCHEDULED SURGERY TIME WHICH NEEDS TO BE COMPLETED AT  0500 am.     CLEAR LIQUID DIET   Foods Allowed                                                                     Foods Excluded  Coffee and tea, regular and decaf                             liquids that you cannot  Plain Jell-O in any flavor                                             see through such as: Fruit ices (not with fruit pulp)                                     milk, soups, orange juice  Iced Popsicles                                    All solid food Carbonated beverages, regular and diet                                    Cranberry, grape and apple juices Sports drinks like Gatorade Lightly seasoned clear broth or consume(fat free) Sugar, honey syrup  Sample Menu Breakfast                                Lunch                                     Supper Cranberry juice                    Beef broth                            Chicken broth Jell-O  Grape juice                           Apple juice Coffee or tea                        Jell-O                                      Popsicle                                                Coffee or tea                        Coffee or tea  _____________________________________________________________________               BRUSH YOUR TEETH MORNING OF SURGERY AND RINSE YOUR MOUTH OUT, NO CHEWING GUM CANDY OR MINTS.     Take  these medicines the morning of surgery with A SIP OF WATER: Citalopram (Celexa), use eye drops, use Albuterol inhaler if needed , use Symbicort inhaler and bring inhalers with you to the hospital.                                  You may not have any metal on your body including hair pins and              piercings  Do not wear jewelry, make-up, lotions, powders or perfumes, deodorant             Do not wear nail polish.  Do not shave  48 hours prior to surgery.               Do not bring valuables to the hospital. [place].  Contacts, dentures or bridgework may not be worn into surgery.  Leave suitcase in the car. After surgery it may be brought to your room.                  Please read over the following fact sheets you were given: _____________________________________________________________________             [facility] - Preparing for Surgery Before surgery, you can play an important role.  Because skin is not sterile, your skin needs to be as free of germs as possible.  You can reduce the number of germs on your skin by washing with CHG (chlorahexidine gluconate) soap before surgery.  CHG is an antiseptic cleaner which kills germs and bonds with the skin to continue killing germs even after washing. Please DO NOT use if you have an allergy to CHG or antibacterial soaps.  If your skin becomes reddened/irritated stop using the CHG and inform your nurse when you arrive at Short Stay. Do not shave (including legs and underarms) for at least 48 hours prior to the first CHG shower.  You may shave your face/neck. Please follow these instructions carefully:  1.  Shower with CHG Soap the night before surgery and the  morning of  Surgery.  2.  If you choose to wash your hair, wash your hair first as usual with your  normal  shampoo.  3.  After you shampoo, rinse your hair and body thoroughly to remove the  shampoo.                            4.  Use CHG as you would any other liquid soap.  You can apply chg directly  to the skin and wash                       Gently with a scrungie or clean washcloth.  5.  Apply the CHG Soap to your body ONLY FROM THE NECK DOWN.   Do not use on face/ open                           Wound or open sores. Avoid contact with eyes, ears mouth and genitals (private parts).                       Wash face,  Genitals (private parts) with your normal soap.             6.  Wash thoroughly, paying special attention to the area where your surgery  will be performed.  7.  Thoroughly rinse your body with warm water from the neck down.  8.  DO NOT shower/wash with your normal soap after using and rinsing off  the CHG Soap.                9.  Pat yourself dry with a clean towel.            10.  Wear clean pajamas.            11.  Place clean sheets on your bed the night of your first shower and do not  sleep with pets. Day of Surgery : Do not apply any lotions/deodorants the morning of surgery.  Please wear clean clothes to the hospital/surgery center.  FAILURE TO FOLLOW THESE INSTRUCTIONS MAY RESULT IN THE CANCELLATION OF YOUR SURGERY PATIENT SIGNATURE_________________________________  NURSE SIGNATURE__________________________________  ________________________________________________________________________

## 2018-10-03 ENCOUNTER — Encounter (HOSPITAL_COMMUNITY): Payer: Self-pay

## 2018-10-03 ENCOUNTER — Other Ambulatory Visit: Payer: Self-pay

## 2018-10-03 ENCOUNTER — Encounter (HOSPITAL_COMMUNITY)
Admission: RE | Admit: 2018-10-03 | Discharge: 2018-10-03 | Disposition: A | Discharge status: Home / Self Care | Payer: BLUE CROSS/BLUE SHIELD | Source: Ambulatory Visit | Attending: General Surgery | Admitting: General Surgery

## 2018-10-03 DIAGNOSIS — C50911 Malignant neoplasm of unspecified site of right female breast: Secondary | ICD-10-CM | POA: Insufficient documentation

## 2018-10-03 DIAGNOSIS — Z01812 Encounter for preprocedural laboratory examination: Secondary | ICD-10-CM | POA: Insufficient documentation

## 2018-10-03 LAB — CBC
HCT: 39.8 % (ref 36.0–46.0)
Hemoglobin: 12.4 g/dL (ref 12.0–15.0)
MCH: 29.2 pg (ref 26.0–34.0)
MCHC: 31.2 g/dL (ref 30.0–36.0)
MCV: 93.6 fL (ref 80.0–100.0)
Platelets: 213 10*3/uL (ref 150–400)
RBC: 4.25 MIL/uL (ref 3.87–5.11)
RDW: 14.6 % (ref 11.5–15.5)
WBC: 6 10*3/uL (ref 4.0–10.5)
nRBC: 0 % (ref 0.0–0.2)

## 2018-10-03 LAB — BASIC METABOLIC PANEL
Anion gap: 5 (ref 5–15)
BUN: 18 mg/dL (ref 8–23)
CO2: 29 mmol/L (ref 22–32)
Calcium: 9.4 mg/dL (ref 8.9–10.3)
Chloride: 105 mmol/L (ref 98–111)
Creatinine, Ser: 0.76 mg/dL (ref 0.44–1.00)
GFR calc Af Amer: >60 mL/min (ref >60)
GFR calc non Af Amer: >60 mL/min (ref >60)
Glucose, Bld: 103 mg/dL — ABNORMAL HIGH (ref 70–99)
Potassium: 4.3 mmol/L (ref 3.5–5.1)
Sodium: 139 mmol/L (ref 135–145)

## 2018-10-03 LAB — HEMOGLOBIN A1C
Hgb A1c MFr Bld: 6.2 % — ABNORMAL HIGH (ref 4.8–5.6)
Mean Plasma Glucose: 131.24 mg/dL

## 2018-10-03 NOTE — Progress Notes (Signed)
08/16/2018- noted in Epic-ECHO  08/14/2018- noted in Epic- CT chest w/contrast  08/12/2018-noted in Epic-CXR 1 view  04/11/2018- noted in Epic-EKG  09/11/2016- noted in Epic-ECHO stress

## 2018-10-07 ENCOUNTER — Encounter: Payer: Self-pay | Admitting: *Deleted

## 2018-10-07 ENCOUNTER — Encounter: Payer: Self-pay | Admitting: Family Medicine

## 2018-10-09 ENCOUNTER — Encounter: Payer: Self-pay | Admitting: Family Medicine

## 2018-10-10 ENCOUNTER — Ambulatory Visit (HOSPITAL_COMMUNITY): Payer: BLUE CROSS/BLUE SHIELD | Admitting: Physician Assistant

## 2018-10-10 ENCOUNTER — Other Ambulatory Visit: Payer: Self-pay

## 2018-10-10 ENCOUNTER — Encounter (HOSPITAL_COMMUNITY)
Admission: RE | Disposition: A | Discharge status: Home / Self Care | Payer: Self-pay | Source: Home / Self Care | Attending: General Surgery

## 2018-10-10 ENCOUNTER — Encounter (HOSPITAL_COMMUNITY): Payer: Self-pay | Admitting: Emergency Medicine

## 2018-10-10 ENCOUNTER — Ambulatory Visit (HOSPITAL_COMMUNITY): Payer: BLUE CROSS/BLUE SHIELD | Admitting: Anesthesiology

## 2018-10-10 ENCOUNTER — Ambulatory Visit (HOSPITAL_COMMUNITY)
Admission: RE | Admit: 2018-10-10 | Discharge: 2018-10-11 | Disposition: A | Discharge status: Home / Self Care | Payer: BLUE CROSS/BLUE SHIELD | Attending: General Surgery | Admitting: General Surgery

## 2018-10-10 DIAGNOSIS — Z4001 Encounter for prophylactic removal of breast: Secondary | ICD-10-CM | POA: Insufficient documentation

## 2018-10-10 DIAGNOSIS — Z79899 Other long term (current) drug therapy: Secondary | ICD-10-CM | POA: Diagnosis not present

## 2018-10-10 DIAGNOSIS — R921 Mammographic calcification found on diagnostic imaging of breast: Secondary | ICD-10-CM | POA: Diagnosis not present

## 2018-10-10 DIAGNOSIS — C50411 Malignant neoplasm of upper-outer quadrant of right female breast: Secondary | ICD-10-CM | POA: Insufficient documentation

## 2018-10-10 DIAGNOSIS — C773 Secondary and unspecified malignant neoplasm of axilla and upper limb lymph nodes: Secondary | ICD-10-CM | POA: Diagnosis not present

## 2018-10-10 DIAGNOSIS — C50911 Malignant neoplasm of unspecified site of right female breast: Secondary | ICD-10-CM | POA: Diagnosis present

## 2018-10-10 DIAGNOSIS — Z170 Estrogen receptor positive status [ER+]: Secondary | ICD-10-CM | POA: Insufficient documentation

## 2018-10-10 DIAGNOSIS — G8918 Other acute postprocedural pain: Secondary | ICD-10-CM | POA: Diagnosis not present

## 2018-10-10 DIAGNOSIS — I10 Essential (primary) hypertension: Secondary | ICD-10-CM | POA: Diagnosis not present

## 2018-10-10 DIAGNOSIS — N6012 Diffuse cystic mastopathy of left breast: Secondary | ICD-10-CM | POA: Diagnosis not present

## 2018-10-10 HISTORY — PX: MASTECTOMY MODIFIED RADICAL: SHX5962

## 2018-10-10 LAB — GLUCOSE, CAPILLARY: GLUCOSE-CAPILLARY: 145 mg/dL — AB (ref 70–99)

## 2018-10-10 SURGERY — MASTECTOMY, MODIFIED RADICAL
Anesthesia: General | Site: Breast | Laterality: Bilateral

## 2018-10-10 MED ORDER — VANCOMYCIN HCL IN DEXTROSE 1-5 GM/200ML-% IV SOLN
1000.0000 mg | INTRAVENOUS | Status: AC
  Administered 2018-10-10: 1000 mg via INTRAVENOUS
  Filled 2018-10-10: qty 200

## 2018-10-10 MED ORDER — FENTANYL CITRATE (PF) 250 MCG/5ML IJ SOLN
INTRAMUSCULAR | Status: DC | PRN
  Administered 2018-10-10: 50 ug via INTRAVENOUS
  Administered 2018-10-10: 100 ug via INTRAVENOUS

## 2018-10-10 MED ORDER — KCL IN DEXTROSE-NACL 20-5-0.9 MEQ/L-%-% IV SOLN
INTRAVENOUS | Status: DC
  Administered 2018-10-10 – 2018-10-11 (×2): via INTRAVENOUS
  Filled 2018-10-10 (×2): qty 1000

## 2018-10-10 MED ORDER — ONDANSETRON 4 MG PO TBDP: 4.0000 mg | ORAL_TABLET | Freq: Four times a day (QID) | ORAL | Status: DC | PRN

## 2018-10-10 MED ORDER — MOMETASONE FURO-FORMOTEROL FUM 200-5 MCG/ACT IN AERO
2.0000 | INHALATION_SPRAY | Freq: Two times a day (BID) | RESPIRATORY_TRACT | Status: DC
  Filled 2018-10-10: qty 8.8

## 2018-10-10 MED ORDER — LIDOCAINE 2% (20 MG/ML) 5 ML SYRINGE
INTRAMUSCULAR | Status: AC
  Filled 2018-10-10: qty 5

## 2018-10-10 MED ORDER — SUGAMMADEX SODIUM 200 MG/2ML IV SOLN
INTRAVENOUS | Status: AC
  Filled 2018-10-10: qty 2

## 2018-10-10 MED ORDER — PHENYLEPHRINE 40 MCG/ML (10ML) SYRINGE FOR IV PUSH (FOR BLOOD PRESSURE SUPPORT)
PREFILLED_SYRINGE | INTRAVENOUS | Status: DC | PRN
  Administered 2018-10-10: 120 ug via INTRAVENOUS
  Administered 2018-10-10: 80 ug via INTRAVENOUS
  Administered 2018-10-10: 120 ug via INTRAVENOUS
  Administered 2018-10-10 (×4): 80 ug via INTRAVENOUS

## 2018-10-10 MED ORDER — CITALOPRAM HYDROBROMIDE 20 MG PO TABS
10.0000 mg | ORAL_TABLET | Freq: Every day | ORAL | Status: DC
  Filled 2018-10-10 (×2): qty 1

## 2018-10-10 MED ORDER — CHLORHEXIDINE GLUCONATE CLOTH 2 % EX PADS: 6.0000 | MEDICATED_PAD | Freq: Once | CUTANEOUS | Status: DC

## 2018-10-10 MED ORDER — ROCURONIUM BROMIDE 100 MG/10ML IV SOLN
INTRAVENOUS | Status: DC | PRN
  Administered 2018-10-10: 50 mg via INTRAVENOUS

## 2018-10-10 MED ORDER — PROPOFOL 10 MG/ML IV BOLUS
INTRAVENOUS | Status: DC | PRN
  Administered 2018-10-10: 150 mg via INTRAVENOUS

## 2018-10-10 MED ORDER — LETROZOLE 2.5 MG PO TABS
2.5000 mg | ORAL_TABLET | Freq: Every day | ORAL | Status: DC
  Filled 2018-10-10 (×2): qty 1

## 2018-10-10 MED ORDER — SUGAMMADEX SODIUM 200 MG/2ML IV SOLN
INTRAVENOUS | Status: DC | PRN
  Administered 2018-10-10: 160 mg via INTRAVENOUS

## 2018-10-10 MED ORDER — PHENYLEPHRINE HCL 10 MG/ML IJ SOLN
INTRAMUSCULAR | Status: AC
  Filled 2018-10-10: qty 1

## 2018-10-10 MED ORDER — MORPHINE SULFATE (PF) 2 MG/ML IV SOLN: 1.0000 mg | INTRAVENOUS | Status: DC | PRN

## 2018-10-10 MED ORDER — BUPIVACAINE-EPINEPHRINE (PF) 0.25% -1:200000 IJ SOLN
INTRAMUSCULAR | Status: DC | PRN
  Administered 2018-10-10 (×2): 30 mL

## 2018-10-10 MED ORDER — ONDANSETRON HCL 4 MG/2ML IJ SOLN
INTRAMUSCULAR | Status: DC | PRN
  Administered 2018-10-10: 4 mg via INTRAVENOUS

## 2018-10-10 MED ORDER — ALBUTEROL SULFATE (2.5 MG/3ML) 0.083% IN NEBU: 2.5000 mg | INHALATION_SOLUTION | Freq: Four times a day (QID) | RESPIRATORY_TRACT | Status: DC | PRN

## 2018-10-10 MED ORDER — MIDAZOLAM HCL 2 MG/2ML IJ SOLN
INTRAMUSCULAR | Status: AC
  Filled 2018-10-10: qty 2

## 2018-10-10 MED ORDER — GABAPENTIN 300 MG PO CAPS
300.0000 mg | ORAL_CAPSULE | ORAL | Status: AC
  Administered 2018-10-10: 300 mg via ORAL
  Filled 2018-10-10: qty 1

## 2018-10-10 MED ORDER — ONDANSETRON HCL 4 MG/2ML IJ SOLN: 4.0000 mg | Freq: Four times a day (QID) | INTRAMUSCULAR | Status: DC | PRN

## 2018-10-10 MED ORDER — ALBUTEROL SULFATE HFA 108 (90 BASE) MCG/ACT IN AERS: 2.0000 | INHALATION_SPRAY | Freq: Four times a day (QID) | RESPIRATORY_TRACT | Status: DC | PRN

## 2018-10-10 MED ORDER — HEPARIN SODIUM (PORCINE) 5000 UNIT/ML IJ SOLN
5000.0000 [IU] | Freq: Three times a day (TID) | INTRAMUSCULAR | Status: DC
  Administered 2018-10-11 (×2): 5000 [IU] via SUBCUTANEOUS
  Filled 2018-10-10 (×2): qty 1

## 2018-10-10 MED ORDER — PHENYLEPHRINE 40 MCG/ML (10ML) SYRINGE FOR IV PUSH (FOR BLOOD PRESSURE SUPPORT)
PREFILLED_SYRINGE | INTRAVENOUS | Status: AC
  Filled 2018-10-10: qty 20

## 2018-10-10 MED ORDER — LISINOPRIL 10 MG PO TABS
10.0000 mg | ORAL_TABLET | ORAL | Status: DC
  Filled 2018-10-10: qty 1

## 2018-10-10 MED ORDER — FENTANYL CITRATE (PF) 100 MCG/2ML IJ SOLN
INTRAMUSCULAR | Status: AC
  Administered 2018-10-10: 100 ug via INTRAVENOUS
  Filled 2018-10-10: qty 2

## 2018-10-10 MED ORDER — LIDOCAINE HCL (CARDIAC) PF 100 MG/5ML IV SOSY
PREFILLED_SYRINGE | INTRAVENOUS | Status: DC | PRN
  Administered 2018-10-10: 40 mg via INTRAVENOUS

## 2018-10-10 MED ORDER — FENTANYL CITRATE (PF) 250 MCG/5ML IJ SOLN
INTRAMUSCULAR | Status: AC
  Filled 2018-10-10: qty 5

## 2018-10-10 MED ORDER — MIDAZOLAM HCL 2 MG/2ML IJ SOLN
1.0000 mg | INTRAMUSCULAR | Status: DC
  Administered 2018-10-10: 2 mg via INTRAVENOUS

## 2018-10-10 MED ORDER — HYDROMORPHONE HCL 1 MG/ML IJ SOLN
INTRAMUSCULAR | Status: AC
  Filled 2018-10-10: qty 2

## 2018-10-10 MED ORDER — HYDROMORPHONE HCL 1 MG/ML IJ SOLN
0.2500 mg | INTRAMUSCULAR | Status: DC | PRN
  Administered 2018-10-10 (×4): 0.5 mg via INTRAVENOUS

## 2018-10-10 MED ORDER — ACETAMINOPHEN 500 MG PO TABS
1000.0000 mg | ORAL_TABLET | ORAL | Status: AC
  Administered 2018-10-10: 1000 mg via ORAL
  Filled 2018-10-10: qty 2

## 2018-10-10 MED ORDER — HYDROCODONE-ACETAMINOPHEN 5-325 MG PO TABS
1.0000 | ORAL_TABLET | ORAL | Status: DC | PRN
  Administered 2018-10-10 – 2018-10-11 (×2): 1 via ORAL
  Filled 2018-10-10 (×2): qty 1

## 2018-10-10 MED ORDER — LACTATED RINGERS IV SOLN
INTRAVENOUS | Status: DC
  Administered 2018-10-10 (×2): via INTRAVENOUS

## 2018-10-10 MED ORDER — DEXAMETHASONE SODIUM PHOSPHATE 10 MG/ML IJ SOLN
INTRAMUSCULAR | Status: AC
  Filled 2018-10-10: qty 1

## 2018-10-10 MED ORDER — ROCURONIUM BROMIDE 100 MG/10ML IV SOLN
INTRAVENOUS | Status: AC
  Filled 2018-10-10: qty 1

## 2018-10-10 MED ORDER — SODIUM CHLORIDE 0.9 % IV SOLN
INTRAVENOUS | Status: DC | PRN
  Administered 2018-10-10: 50 ug/min via INTRAVENOUS

## 2018-10-10 MED ORDER — PANTOPRAZOLE SODIUM 40 MG IV SOLR
40.0000 mg | Freq: Every day | INTRAVENOUS | Status: DC
  Administered 2018-10-10: 40 mg via INTRAVENOUS
  Filled 2018-10-10: qty 40

## 2018-10-10 MED ORDER — DEXAMETHASONE SODIUM PHOSPHATE 10 MG/ML IJ SOLN
INTRAMUSCULAR | Status: DC | PRN
  Administered 2018-10-10: 10 mg via INTRAVENOUS

## 2018-10-10 MED ORDER — ONDANSETRON HCL 4 MG/2ML IJ SOLN
INTRAMUSCULAR | Status: AC
  Filled 2018-10-10: qty 2

## 2018-10-10 MED ORDER — MIDAZOLAM HCL 2 MG/2ML IJ SOLN
INTRAMUSCULAR | Status: DC | PRN
  Administered 2018-10-10: 1 mg via INTRAVENOUS

## 2018-10-10 MED ORDER — FENTANYL CITRATE (PF) 100 MCG/2ML IJ SOLN
50.0000 ug | INTRAMUSCULAR | Status: DC
  Administered 2018-10-10: 100 ug via INTRAVENOUS

## 2018-10-10 MED ORDER — PROPOFOL 10 MG/ML IV BOLUS
INTRAVENOUS | Status: AC
  Filled 2018-10-10: qty 20

## 2018-10-10 MED ORDER — MIDAZOLAM HCL 2 MG/2ML IJ SOLN
INTRAMUSCULAR | Status: AC
  Administered 2018-10-10: 2 mg via INTRAVENOUS
  Filled 2018-10-10: qty 2

## 2018-10-10 MED ORDER — METHOCARBAMOL 500 MG PO TABS: 500.0000 mg | ORAL_TABLET | Freq: Four times a day (QID) | ORAL | Status: DC | PRN

## 2018-10-10 SURGICAL SUPPLY — 38 items
APPLIER CLIP 11 MED OPEN (CLIP) ×2
APPLIER CLIP 13 LRG OPEN (CLIP)
BENZOIN TINCTURE PRP APPL 2/3 (GAUZE/BANDAGES/DRESSINGS) ×2 IMPLANT
BINDER BREAST XLRG (GAUZE/BANDAGES/DRESSINGS) ×2 IMPLANT
BLADE PHOTON ILLUMINATED (MISCELLANEOUS) ×2 IMPLANT
CLIP APPLIE 11 MED OPEN (CLIP) ×1 IMPLANT
CLIP APPLIE 13 LRG OPEN (CLIP) IMPLANT
COVER WAND RF STERILE (DRAPES) IMPLANT
DERMABOND ADVANCED (GAUZE/BANDAGES/DRESSINGS) ×1
DERMABOND ADVANCED .7 DNX12 (GAUZE/BANDAGES/DRESSINGS) ×1 IMPLANT
DRAIN CHANNEL 19F RND (DRAIN) ×6 IMPLANT
DRAPE LAPAROSCOPIC ABDOMINAL (DRAPES) ×2 IMPLANT
DRAPE SHEET LG 3/4 BI-LAMINATE (DRAPES) IMPLANT
DRSG PAD ABDOMINAL 8X10 ST (GAUZE/BANDAGES/DRESSINGS) ×2 IMPLANT
ELECT REM PT RETURN 15FT ADLT (MISCELLANEOUS) ×2 IMPLANT
EVACUATOR SILICONE 100CC (DRAIN) ×6 IMPLANT
GAUZE SPONGE 4X4 12PLY STRL (GAUZE/BANDAGES/DRESSINGS) ×4 IMPLANT
GAUZE SPONGE 4X4 16PLY XRAY LF (GAUZE/BANDAGES/DRESSINGS) ×2 IMPLANT
GLOVE BIO SURGEON STRL SZ7.5 (GLOVE) ×4 IMPLANT
GLOVE BIOGEL PI IND STRL 7.0 (GLOVE) ×1 IMPLANT
GLOVE BIOGEL PI INDICATOR 7.0 (GLOVE) ×1
GOWN STRL REUS W/TWL LRG LVL3 (GOWN DISPOSABLE) ×2 IMPLANT
GOWN STRL REUS W/TWL XL LVL3 (GOWN DISPOSABLE) ×4 IMPLANT
KIT BASIN OR (CUSTOM PROCEDURE TRAY) ×2 IMPLANT
KIT TURNOVER KIT A (KITS) IMPLANT
MARKER SKIN DUAL TIP RULER LAB (MISCELLANEOUS) ×2 IMPLANT
NS IRRIG 1000ML POUR BTL (IV SOLUTION) IMPLANT
PACK GENERAL/GYN (CUSTOM PROCEDURE TRAY) ×2 IMPLANT
SPONGE LAP 18X18 RF (DISPOSABLE) ×4 IMPLANT
STAPLER VISISTAT 35W (STAPLE) IMPLANT
STRIP CLOSURE SKIN 1/2X4 (GAUZE/BANDAGES/DRESSINGS) ×2 IMPLANT
SUT ETHILON 3 0 PS 1 (SUTURE) ×4 IMPLANT
SUT MNCRL AB 4-0 PS2 18 (SUTURE) ×4 IMPLANT
SUT SILK 3 0 (SUTURE) ×1
SUT SILK 3-0 18XBRD TIE 12 (SUTURE) ×1 IMPLANT
SUT VIC AB 3-0 SH 18 (SUTURE) ×4 IMPLANT
TOWEL OR 17X26 10 PK STRL BLUE (TOWEL DISPOSABLE) ×4 IMPLANT
WATER STERILE IRR 1000ML POUR (IV SOLUTION) ×4 IMPLANT

## 2018-10-10 NOTE — Interval H&P Note (Signed)
History and Physical Interval Note:  10/10/2018 7:23 AM  [name]  has presented today for surgery, with the diagnosis of RIGHT BREAST CANCER.  The various methods of treatment have been discussed with the patient and family. After consideration of risks, benefits and other options for treatment, the patient has consented to  Procedure(s): RIGHT MODIFIED RADICAL MASTECTOMY AND LEFT PROPHYLATIC MASTECTOMY (Bilateral) as a surgical intervention.  The patient's history has been reviewed, patient examined, no change in status, stable for surgery.  I have reviewed the patient's chart and labs.  Questions were answered to the patient's satisfaction.     [name]

## 2018-10-10 NOTE — Anesthesia Preprocedure Evaluation (Signed)
Anesthesia Evaluation  Patient identified by MRN, date of birth, ID band Patient awake    Reviewed: Allergy & Precautions, NPO status , Patient's Chart, lab work & pertinent test results  Airway Mallampati: II  TM Distance: >3 FB Neck ROM: Full    Dental no notable dental hx.    Pulmonary asthma ,    Pulmonary exam normal breath sounds clear to auscultation       Cardiovascular hypertension, Normal cardiovascular exam Rhythm:Regular Rate:Normal     Neuro/Psych Anxiety negative neurological ROS     GI/Hepatic negative GI ROS, Neg liver ROS,   Endo/Other  negative endocrine ROS  Renal/GU negative Renal ROS  negative genitourinary   Musculoskeletal negative musculoskeletal ROS (+)   Abdominal   Peds negative pediatric ROS (+)  Hematology negative hematology ROS (+)   Anesthesia Other Findings   Reproductive/Obstetrics negative OB ROS                             Anesthesia Physical Anesthesia Plan  ASA: III  Anesthesia Plan: General   Post-op Pain Management:  Regional for Post-op pain   Induction: Intravenous  PONV Risk Score and Plan: 3 and Ondansetron, Dexamethasone, Treatment may vary due to age or medical condition and Scopolamine patch - Pre-op  Airway Management Planned: Oral ETT  Additional Equipment:   Intra-op Plan:   Post-operative Plan: Extubation in OR  Informed Consent: I have reviewed the patients History and Physical, chart, labs and discussed the procedure including the risks, benefits and alternatives for the proposed anesthesia with the patient or authorized representative who has indicated his/her understanding and acceptance.     Dental advisory given  Plan Discussed with: CRNA and Surgeon  Anesthesia Plan Comments:         Anesthesia Quick Evaluation

## 2018-10-10 NOTE — Anesthesia Procedure Notes (Signed)
Procedure Name: Intubation Date/Time: 10/10/2018 8:11 AM Performed by: Raenette Rover, CRNA Pre-anesthesia Checklist: Patient identified, Emergency Drugs available, Suction available and Patient being monitored Patient Re-evaluated:Patient Re-evaluated prior to induction Oxygen Delivery Method: Circle system utilized Preoxygenation: Pre-oxygenation with 100% oxygen Induction Type: IV induction Ventilation: Mask ventilation without difficulty Laryngoscope Size: Mac and 3 Grade View: Grade I Tube type: Oral Tube size: 7.0 mm Number of attempts: 1 Airway Equipment and Method: Stylet Placement Confirmation: ETT inserted through vocal cords under direct vision,  positive ETCO2,  CO2 detector and breath sounds checked- equal and bilateral Secured at: 21 cm Tube secured with: Tape Dental Injury: Teeth and Oropharynx as per pre-operative assessment

## 2018-10-10 NOTE — Transfer of Care (Signed)
Immediate Anesthesia Transfer of Care Note  Patient: [name]  Procedure(s) Performed: RIGHT MODIFIED RADICAL MASTECTOMY AND LEFT PROPHYLATIC MASTECTOMY (Bilateral Breast)  Patient Location: PACU  Anesthesia Type:GA combined with regional for post-op pain  Level of Consciousness: awake, alert , oriented, drowsy and patient cooperative  Airway & Oxygen Therapy: Patient Spontanous Breathing and Patient connected to face mask oxygen  Post-op Assessment: Report given to RN and Post -op Vital signs reviewed and stable  Post vital signs: Reviewed and stable  Last Vitals:  Vitals Value Taken Time  BP 158/71 10/10/2018 12:12 PM  Temp    Pulse 68 10/10/2018 12:15 PM  Resp 12 10/10/2018 12:15 PM  SpO2 100 % 10/10/2018 12:15 PM  Vitals shown include unvalidated device data.  Last Pain:  Vitals:   10/10/18 0633  TempSrc:   PainSc: 0-No pain      Patients Stated Pain Goal: 4 (42/10/31 2811)  Complications: No apparent anesthesia complications

## 2018-10-10 NOTE — Op Note (Signed)
10/10/2018  11:58 AM  PATIENT:  [name]  63 y.o. female  PRE-OPERATIVE DIAGNOSIS:  RIGHT BREAST CANCER  POST-OPERATIVE DIAGNOSIS:  RIGHT BREAST CANCER  PROCEDURE:  Procedure(s): RIGHT MODIFIED RADICAL MASTECTOMY AND LEFT PROPHYLATIC MASTECTOMY  SURGEON:  Surgeon(s) and Role:    * [name], MD - Primary  PHYSICIAN ASSISTANT:   ASSISTANTS: none   ANESTHESIA:   general  EBL:  50 mL   BLOOD ADMINISTERED:none  DRAINS: (3) Jackson-Pratt drain(s) with closed bulb suction in the prepectoral space   LOCAL MEDICATIONS USED:  NONE  SPECIMEN:  Source of Specimen:  left mastectomy and right mastectomy and axillary contents  DISPOSITION OF SPECIMEN:  PATHOLOGY  COUNTS:  YES  TOURNIQUET:  * No tourniquets in log *  DICTATION: .Dragon Dictation   After informed consent was obtained the patient was brought to the operating room and placed in the supine position on the operating table.  After adequate induction of general anesthesia the patient's bilateral chest, breast, and axillary areas were prepped with ChloraPrep, allowed to dry, and draped in usual sterile manner.  An appropriate timeout was performed.  Attention was first turned to the left breast.  This was the benign side.  An elliptical incision was made with a 10 blade knife around the nipple and areole a complex in order to minimize the excess skin.  The incision was carried through the skin and subcutaneous tissue sharply with the photon blade.  Breast hooks were used to elevate the skin flaps anteriorly towards the ceiling.  Thin skin flaps were then created circumferentially by dissecting between the breast tissue and the subcutaneous fat.  This dissection was carried all the way to the chest wall.  Next the breast was removed from the pectoralis muscle with the pectoralis fascia.  Once this was accomplished the breast was removed from the patient.  The breast was marked with a stitch on the lateral skin and sent to  pathology for further evaluation.  Hemostasis was achieved using the photon blade.  The wound was irrigated with saline.  A small stab incision was made near the anterior axillary line inferior to the operative bed with the photon blade.  A hemostat was placed through this opening and used to bring a 19 [place] round Blake drain into the operative bed.  The drain was curled along the chest wall.  The drain was anchored to the skin with a 3-0 nylon stitch.  Next the superior and inferior skin flaps were grossly reapproximated with interrupted 3-0 Vicryl stitches.  The skin was closed with a running 4-0 Monocryl subcuticular stitch.  Dermabond dressings were applied.  Attention was then turned to the right breast.  A similar elliptical incision was made around the nipple and areole a complex in order to be widely around the palpable cancer as well as to minimize the excess skin.  The incision was carried through the skin and subcutaneous tissue sharply with the photon blade.  Breast hooks were used to elevate the skin flaps anteriorly towards the ceiling.  Thin skin flaps were then created circumferentially by dissecting between the breast tissue and the subcutaneous fatty tissue.  This dissection was carried all the way to the chest wall.  Next the breast was removed from the pectoralis muscle with the pectoralis fascia.  Once the dissection reached the axilla then the dissection medially and laterally was carried out until we identified the serratus muscle medially and the latissimus muscle laterally.  The dissection  was then carried superiorly until I was able to identify the right axillary vein.  The contents within these boundaries was then gently separated by blunt right angle dissection.  Several small vessels and lymphatics and intercostal brachial nerves were controlled with clips.  The patient had multiple firm enlarged lymph nodes.  All of these nodes including a couple retropectoral nodes were removed en  bloc with the dissection.  Once this was accomplished then the breast with the entire axillary contents was sent together to pathology for further evaluation.  The long thoracic and thoracodorsal nerves were identified and spared.  Hemostasis was achieved using the photon blade.  The wound was irrigated with saline.  The wound was then also irrigated with sterile water.  2 small stab incisions were then made near the anterior axillary line inferior to the operative bed with the photon blade.  Hemostat was placed through each of these openings and used to bring a 19 [place] round Blake drain into the operative bed.  The lateral drain was placed in the axilla and the medial drain was curled along the chest wall.  The drains were anchored to the skin with 3-0 nylon stitches.  Next the superior and inferior skin flaps were grossly reapproximated with interrupted 3-0 Vicryl stitches.  The skin was then closed with a running 4-0 Monocryl subcuticular stitch.  Before closing each of the incision some of the lateral axillary tissue was anchored to the chest wall with interrupted 0 Vicryl stitches.  Dermabond dressings were applied.  The patient tolerated the procedure well.  At the end of the case all needle sponge and instrument counts were correct.  The patient was then awakened and taken to recovery in stable condition.  The drains were placed to bulb suction and there was a good seal.  PLAN OF CARE: Admit for overnight observation  PATIENT DISPOSITION:  PACU - hemodynamically stable.   Delay start of Pharmacological VTE agent (>24hrs) due to surgical blood loss or risk of bleeding: no

## 2018-10-10 NOTE — Anesthesia Procedure Notes (Signed)
Anesthesia Procedure Image    

## 2018-10-10 NOTE — Anesthesia Procedure Notes (Signed)
Anesthesia Regional Block: Pectoralis block   Pre-Anesthetic Checklist: ,, timeout performed, Correct Patient, Correct Site, Correct Laterality, Correct Procedure, Correct Position, site marked, Risks and benefits discussed,  Surgical consent,  Pre-op evaluation,  At surgeon's request and post-op pain management  Laterality: Right  Prep: chloraprep       Needles:  Injection technique: Single-shot  Needle Type: Echogenic Needle     Needle Length: 9cm      Additional Needles:   Procedures:,,,, ultrasound used (permanent image in chart),,,,  Narrative:  Start time: 10/10/2018 7:34 AM End time: 10/10/2018 7:40 AM Injection made incrementally with aspirations every 5 mL.  Performed by: Personally  Anesthesiologist: Myrtie Soman, MD  Additional Notes: Patient tolerated the procedure well without complications

## 2018-10-10 NOTE — Progress Notes (Signed)
Assisted Dr. Kalman Shan with right and  left, ultrasound guided, pectoralis block. Side rails up, monitors on throughout procedure. See vital signs in flow sheet. Tolerated Procedure well.

## 2018-10-10 NOTE — Anesthesia Procedure Notes (Signed)
Anesthesia Regional Block: Pectoralis block   Pre-Anesthetic Checklist: ,, timeout performed, Correct Patient, Correct Site, Correct Laterality, Correct Procedure, Correct Position, site marked, Risks and benefits discussed,  Surgical consent,  Pre-op evaluation,  At surgeon's request and post-op pain management  Laterality: Left  Prep: chloraprep       Needles:  Injection technique: Single-shot  Needle Type: Echogenic Needle     Needle Length: 9cm      Additional Needles:   Procedures:,,,, ultrasound used (permanent image in chart),,,,  Narrative:  Start time: 10/10/2018 7:40 AM End time: 10/10/2018 7:47 AM Injection made incrementally with aspirations every 5 mL.  Performed by: Personally  Anesthesiologist: Myrtie Soman, MD  Additional Notes: Patient tolerated the procedure well without complications

## 2018-10-10 NOTE — H&P (Signed)
[name]  Location: [facility] Patient #: [id number] DOB: 1955/11/10 Divorced / Language: Cleophus Molt / Race: White Female   History of Present Illness The patient is a 63 year old female who presents for a follow-up for Breast cancer. The patient is a 63 year old white female who has a known locally advanced right breast cancer that is a T4 N1 cancer that is ER and PR positive and HER-2 negative with a Ki-67 of 80%. She is scheduled for right modified radical mastectomy and left prophylactic mastectomy next week. She returns today with questions. She tried neoadjuvant chemotherapy but only tolerated one dose and quit.   Allergies  Penicillins  Sulfa Antibiotics  Allergies Reconciled   Medication History Budesonide (Inhalation) Specific strength unknown - Active. CeleXA (10MG Tablet, Oral) Active. Lisinopril (10MG Tablet, Oral) Active. Triamcinolone Acetonide (0.5% Cream, External) Active. Olopatadine HCl (0.1% Solution, Ophthalmic) Active. Claritin (10MG Capsule, Oral) Active. B & C B-Complex (Oral) Active. Garlic (Oral) Specific strength unknown - Active. Omega 3 (Oral) Specific strength unknown - Active. Vitamin C (Oral) Specific strength unknown - Active. Medications Reconciled    Review of Systems  General Present- Fatigue, Night Sweats and Weight Loss. Not Present- Appetite Loss, Chills, Fever and Weight Gain. Skin Present- Dryness. Not Present- Change in Wart/Mole, Hives, Jaundice, New Lesions, Non-Healing Wounds, Rash and Ulcer. HEENT Present- Ringing in the Ears, Seasonal Allergies and Wears glasses/contact lenses. Not Present- Earache, Hearing Loss, Hoarseness, Nose Bleed, Oral Ulcers, Sinus Pain, Sore Throat, Visual Disturbances and Yellow Eyes. Breast Present- Breast Mass and Skin Changes. Not Present- Breast Pain and Nipple Discharge. Cardiovascular Present- Palpitations. Not Present- Chest Pain, Difficulty Breathing Lying Down, Leg  Cramps, Rapid Heart Rate, Shortness of Breath and Swelling of Extremities. Gastrointestinal Present- Change in Bowel Habits. Not Present- Abdominal Pain, Bloating, Bloody Stool, Chronic diarrhea, Constipation, Difficulty Swallowing, Excessive gas, Gets full quickly at meals, Hemorrhoids, Indigestion, Nausea, Rectal Pain and Vomiting. Musculoskeletal Present- Muscle Pain. Not Present- Back Pain, Joint Pain, Joint Stiffness, Muscle Weakness and Swelling of Extremities. Neurological Present- Weakness. Not Present- Decreased Memory, Fainting, Headaches, Numbness, Seizures, Tingling, Tremor and Trouble walking. Psychiatric Present- Anxiety and Fearful. Not Present- Bipolar, Change in Sleep Pattern, Depression and Frequent crying. Endocrine Present- Excessive Hunger. Not Present- Cold Intolerance, Hair Changes, Heat Intolerance, Hot flashes and New Diabetes. Hematology Not Present- Blood Thinners, Easy Bruising, Excessive bleeding, Gland problems, HIV and Persistent Infections.  Vitals  Weight: 178.4 lb Height: 64in Body Surface Area: 1.86 m Body Mass Index: 30.62 kg/m  Temp.: 98.61F  Pulse: 108 (Regular)  BP: 138/72 (Sitting, Left Arm, Standard)       Physical Exam  General Mental Status-Alert. General Appearance-Consistent with stated age. Hydration-Well hydrated. Voice-Normal.  Head and Neck Head-normocephalic, atraumatic with no lesions or palpable masses. Trachea-midline. Thyroid Gland Characteristics - normal size and consistency.  Eye Eyeball - Bilateral-Extraocular movements intact. Sclera/Conjunctiva - Bilateral-No scleral icterus.  Chest and Lung Exam Chest and lung exam reveals -quiet, even and easy respiratory effort with no use of accessory muscles and on auscultation, normal breath sounds, no adventitious sounds and normal vocal resonance. Inspection Chest Wall - Normal. Back - normal.  Breast Note: There is a 3 cm palpable mass in  the upper outer right breast as well as some palpable fullness more centrally in the right breast. There is a enlarged palpable lymph node in the right axilla. There is no palpable mass in the left breast. There is no palpable axillary, supra clavicular, or cervical lymphadenopathy  on the left.   Cardiovascular Cardiovascular examination reveals -normal heart sounds, regular rate and rhythm with no murmurs and normal pedal pulses bilaterally.  Abdomen Inspection Inspection of the abdomen reveals - No Hernias. Skin - Scar - no surgical scars. Palpation/Percussion Palpation and Percussion of the abdomen reveal - Soft, Non Tender, No Rebound tenderness, No Rigidity (guarding) and No hepatosplenomegaly. Auscultation Auscultation of the abdomen reveals - Bowel sounds normal.  Neurologic Neurologic evaluation reveals -alert and oriented x 3 with no impairment of recent or remote memory. Mental Status-Normal.  Musculoskeletal Normal Exam - Left-Upper Extremity Strength Normal and Lower Extremity Strength Normal. Normal Exam - Right-Upper Extremity Strength Normal and Lower Extremity Strength Normal.  Lymphatic Head & Neck  General Head & Neck Lymphatics: Bilateral - Description - Normal. Axillary  General Axillary Region: Bilateral - Description - Normal. Tenderness - Non Tender. Femoral & Inguinal  Generalized Femoral & Inguinal Lymphatics: Bilateral - Description - Normal. Tenderness - Non Tender.    Assessment & Plan MALIGNANT NEOPLASM OF UPPER-OUTER QUADRANT OF RIGHT BREAST IN FEMALE, ESTROGEN RECEPTOR POSITIVE (C50.411) Impression: The patient has a known locally advanced right breast cancer. She returns today with questions about surgery. All of her questions were answered I believe to her satisfaction. Surgery is scheduled for next week. We are planning for a right modified radical mastectomy and left prophylactic mastectomy. I have discussed with her in detail the  risks and benefits of the operation as well as some of the technical aspects and she understands and wishes to proceed Current Plans Follow up with [place] in the office in 1 month.  Call us sooner as needed.

## 2018-10-10 NOTE — Anesthesia Postprocedure Evaluation (Signed)
Anesthesia Post Note  Patient: [name]  Procedure(s) Performed: RIGHT MODIFIED RADICAL MASTECTOMY AND LEFT PROPHYLATIC MASTECTOMY (Bilateral Breast)     Patient location during evaluation: PACU Anesthesia Type: General Level of consciousness: awake and alert Pain management: pain level controlled Vital Signs Assessment: post-procedure vital signs reviewed and stable Respiratory status: spontaneous breathing, nonlabored ventilation, respiratory function stable and patient connected to nasal cannula oxygen Cardiovascular status: blood pressure returned to baseline and stable Postop Assessment: no apparent nausea or vomiting Anesthetic complications: no    Last Vitals:  Vitals:   10/10/18 1315 10/10/18 1330  BP: (!) 161/73 (!) 157/75  Pulse: (!) 57 73  Resp: 12 15  Temp:    SpO2: 100% 100%    Last Pain:  Vitals:   10/10/18 1315  TempSrc:   PainSc: 4                  Rashied Corallo S

## 2018-10-11 ENCOUNTER — Encounter (HOSPITAL_COMMUNITY): Payer: Self-pay | Admitting: General Surgery

## 2018-10-11 DIAGNOSIS — C773 Secondary and unspecified malignant neoplasm of axilla and upper limb lymph nodes: Secondary | ICD-10-CM | POA: Diagnosis not present

## 2018-10-11 DIAGNOSIS — Z170 Estrogen receptor positive status [ER+]: Secondary | ICD-10-CM | POA: Diagnosis not present

## 2018-10-11 DIAGNOSIS — Z4001 Encounter for prophylactic removal of breast: Secondary | ICD-10-CM | POA: Diagnosis not present

## 2018-10-11 DIAGNOSIS — C50411 Malignant neoplasm of upper-outer quadrant of right female breast: Secondary | ICD-10-CM | POA: Diagnosis not present

## 2018-10-11 DIAGNOSIS — Z79899 Other long term (current) drug therapy: Secondary | ICD-10-CM | POA: Diagnosis not present

## 2018-10-11 DIAGNOSIS — I10 Essential (primary) hypertension: Secondary | ICD-10-CM | POA: Diagnosis not present

## 2018-10-11 MED ORDER — HYDROCODONE-ACETAMINOPHEN 5-325 MG PO TABS: 1.0000 | ORAL_TABLET | Freq: Four times a day (QID) | ORAL | 0 refills | Status: DC | PRN

## 2018-10-11 NOTE — Progress Notes (Signed)
1 Day Post-Op   Subjective/Chief Complaint: No complaints   Objective: Vital signs in last 24 hours: Temp:  [87.7 F (30.9 C)-98.7 F (37.1 C)] 97.9 F (36.6 C) (03/13 0607) Pulse Rate:  [61-76] 61 (03/13 0607) Resp:  [14-18] 18 (03/13 0607) BP: (117-154)/(57-73) 127/65 (03/13 0607) SpO2:  [93 %-100 %] 99 % (03/13 0607)    Intake/Output from previous day: 03/12 0701 - 03/13 0700 In: 3272.9 [P.O.:720; I.V.:2512.9] Out: 1027 [Urine:2900; Drains:285; Blood:50] Intake/Output this shift: Total I/O In: 259.8 [P.O.:60; I.V.:199.8] Out: 1325 [Urine:1200; Drains:125]  General appearance: alert and cooperative Resp: clear to auscultation bilaterally Chest wall: skin flaps look good Cardio: regular rate and rhythm GI: soft, non-tender; bowel sounds normal; no masses,  no organomegaly  Lab Results:  No results for input(s): WBC, HGB, HCT, PLT in the last 72 hours. BMET No results for input(s): NA, K, CL, CO2, GLUCOSE, BUN, CREATININE, CALCIUM in the last 72 hours. PT/INR No results for input(s): LABPROT, INR in the last 72 hours. ABG No results for input(s): PHART, HCO3 in the last 72 hours.  Invalid input(s): PCO2, PO2  Studies/Results: No results found.  Anti-infectives: Anti-infectives (From admission, onward)   Start     Dose/Rate Route Frequency Ordered Stop   10/10/18 0615  vancomycin (VANCOCIN) IVPB 1000 mg/200 mL premix     1,000 mg 200 mL/hr over 60 Minutes Intravenous On call to O.R. 10/10/18 0607 10/10/18 0913      Assessment/Plan: s/p Procedure(s): RIGHT MODIFIED RADICAL MASTECTOMY AND LEFT PROPHYLATIC MASTECTOMY (Bilateral) Advance diet Discharge  LOS: 0 days    [name] 10/11/2018

## 2018-10-11 NOTE — TOC Initial Note (Signed)
Transition of Care [facility]) - Initial/Assessment Note    Patient Details  Name: [name] MRN: [id number] Date of Birth: [birth year]  Transition of Care [facility]) CM/SW Contact:    Maureen Duesing, Marjie Skiff, RN Phone Number: 10/11/2018, 1:13 PM  Clinical Narrative:                   Expected Discharge Plan: [place] Barriers to Discharge: No Barriers Identified   Patient Goals and CMS Choice Patient states their goals for this hospitalization and ongoing recovery are:: to feel better CMS Medicare.gov Compare Post Acute Care list provided to:: Patient Choice offered to / list presented to : Patient  Expected Discharge Plan and Services Expected Discharge Plan: [place] Discharge Planning Services: CM Consult Post Acute Care Choice: [place] arrangements for the past 2 months: Single Family Home                     HH Arranged: RN [place] Agency: [place]  Prior Living Arrangements/Services Living arrangements for the past 2 months: Single Family Home Lives with:: Self Patient language and need for interpreter reviewed:: Yes Do you feel safe going back to the place where you live?: Yes      Need for Family Participation in Patient Care: No (Comment) Care giver support system in place?: Yes (comment)   Criminal Activity/Legal Involvement Pertinent to Current Situation/Hospitalization: No - Comment as needed  Activities of Daily Living Home Assistive Devices/Equipment: Eyeglasses ADL Screening (condition at time of admission) Patient's cognitive ability adequate to safely complete daily activities?: Yes Is the patient deaf or have difficulty hearing?: No Does the patient have difficulty seeing, even when wearing glasses/contacts?: No Does the patient have difficulty concentrating, remembering, or making decisions?: No Patient able to express need for assistance with ADLs?: Yes Does the patient have difficulty dressing or  bathing?: No Independently performs ADLs?: Yes (appropriate for developmental age) Does the patient have difficulty walking or climbing stairs?: No Weakness of Legs: None Weakness of Arms/Hands: None  Permission Sought/Granted Permission sought to share information with : Chartered certified accountant granted to share information with : Yes, Verbal Permission Granted     Permission granted to share info w AGENCY: [place]        Emotional Assessment Appearance:: Appears stated age Attitude/Demeanor/Rapport: Apprehensive Affect (typically observed): Apprehensive Orientation: : Oriented to Self, Oriented to Place, Oriented to  Time, Oriented to Situation Alcohol / Substance Use: Not Applicable Psych Involvement: No (comment)  Admission diagnosis:  RIGHT BREAST CANCER Patient Active Problem List   Diagnosis Date Noted  . Cancer of right female breast ([place]) 10/10/2018  . Malignant neoplasm of upper-outer quadrant of right breast in female, estrogen receptor positive ([place]) 07/25/2018  . Dysuria 04/30/2018  . Positive ANA (antinuclear antibody) 04/15/2018  . Elevated CK 04/15/2018  . Vitamin D deficiency 04/15/2018  . Stress reaction 04/15/2018  . Abnormal fasting glucose 10/11/2017  . Seborrheic dermatitis 07/19/2017  . Allergic conjunctivitis 07/19/2017  . Gastritis 06/27/2017  . BMI 33.0-33.9,adult 05/03/2017  . Palpitation 08/29/2016  . Insomnia 09/28/2015  . Menopausal symptom 05/12/2014  . Asthma, persistent controlled 02/26/2014  . Nummular eczema 02/26/2014  . Prediabetes 02/26/2014  . Hyperlipidemia 12/31/2013  . Essential hypertension 08/23/2012  . GAD (generalized anxiety disorder) 05/01/2012   PCP:  Gregor Hams, MD Pharmacy:   [id number] - [place], [address]  [place] [address] Phone: [phone] Fax: [phone]     Social Determinants of Health (SDOH) Interventions    Readmission Risk  Interventions  No flowsheet data found.

## 2018-10-11 NOTE — Progress Notes (Signed)
Pt alert, oriented, tolerating diet.  Instructed on how to empty and charge her drains.  D/C instructions were given. Pt d/cd home.

## 2018-10-12 ENCOUNTER — Encounter: Payer: Self-pay | Admitting: Family Medicine

## 2018-10-14 DIAGNOSIS — I10 Essential (primary) hypertension: Secondary | ICD-10-CM | POA: Diagnosis not present

## 2018-10-14 DIAGNOSIS — Z4801 Encounter for change or removal of surgical wound dressing: Secondary | ICD-10-CM | POA: Diagnosis not present

## 2018-10-14 DIAGNOSIS — J45998 Other asthma: Secondary | ICD-10-CM | POA: Diagnosis not present

## 2018-10-14 DIAGNOSIS — Z483 Aftercare following surgery for neoplasm: Secondary | ICD-10-CM | POA: Diagnosis not present

## 2018-10-14 DIAGNOSIS — C50911 Malignant neoplasm of unspecified site of right female breast: Secondary | ICD-10-CM | POA: Diagnosis not present

## 2018-10-15 ENCOUNTER — Encounter: Payer: Self-pay | Admitting: Family Medicine

## 2018-10-15 MED ORDER — BUDESONIDE-FORMOTEROL FUMARATE 160-4.5 MCG/ACT IN AERO: 2.0000 | INHALATION_SPRAY | Freq: Two times a day (BID) | RESPIRATORY_TRACT | 1 refills | Status: DC

## 2018-10-16 ENCOUNTER — Encounter: Payer: Self-pay | Admitting: Family Medicine

## 2018-10-16 DIAGNOSIS — Z853 Personal history of malignant neoplasm of breast: Secondary | ICD-10-CM | POA: Diagnosis not present

## 2018-10-16 DIAGNOSIS — F331 Major depressive disorder, recurrent, moderate: Secondary | ICD-10-CM | POA: Diagnosis not present

## 2018-10-16 DIAGNOSIS — C50811 Malignant neoplasm of overlapping sites of right female breast: Secondary | ICD-10-CM | POA: Diagnosis not present

## 2018-10-16 DIAGNOSIS — Z170 Estrogen receptor positive status [ER+]: Secondary | ICD-10-CM | POA: Diagnosis not present

## 2018-10-16 DIAGNOSIS — E6609 Other obesity due to excess calories: Secondary | ICD-10-CM | POA: Diagnosis not present

## 2018-10-16 DIAGNOSIS — Z5111 Encounter for antineoplastic chemotherapy: Secondary | ICD-10-CM | POA: Diagnosis not present

## 2018-10-16 DIAGNOSIS — Z08 Encounter for follow-up examination after completed treatment for malignant neoplasm: Secondary | ICD-10-CM | POA: Diagnosis not present

## 2018-10-16 DIAGNOSIS — F411 Generalized anxiety disorder: Secondary | ICD-10-CM | POA: Diagnosis not present

## 2018-10-16 DIAGNOSIS — I10 Essential (primary) hypertension: Secondary | ICD-10-CM | POA: Diagnosis not present

## 2018-10-16 DIAGNOSIS — Z6830 Body mass index (BMI) 30.0-30.9, adult: Secondary | ICD-10-CM | POA: Diagnosis not present

## 2018-10-17 ENCOUNTER — Encounter: Payer: Self-pay | Admitting: Family Medicine

## 2018-10-22 ENCOUNTER — Encounter: Payer: Self-pay | Admitting: Family Medicine

## 2018-10-28 ENCOUNTER — Encounter: Payer: Self-pay | Admitting: Family Medicine

## 2018-10-30 ENCOUNTER — Encounter: Payer: Self-pay | Admitting: Family Medicine

## 2018-10-31 ENCOUNTER — Telehealth: Payer: Self-pay | Admitting: *Deleted

## 2018-10-31 NOTE — Telephone Encounter (Signed)
Left vm for pt regarding scheduling scans and discussing with Dr. Georgiann Cocker on having them scheduled for an earlier date. Request return call for further questions or needs.

## 2018-11-12 ENCOUNTER — Ambulatory Visit (INDEPENDENT_AMBULATORY_CARE_PROVIDER_SITE_OTHER): Payer: BLUE CROSS/BLUE SHIELD | Admitting: Family Medicine

## 2018-11-12 ENCOUNTER — Other Ambulatory Visit: Payer: Self-pay

## 2018-11-12 ENCOUNTER — Encounter: Payer: Self-pay | Admitting: Family Medicine

## 2018-11-12 VITALS — BP 134/76 | HR 90 | Temp 101.2°F | Wt 179.0 lb

## 2018-11-12 DIAGNOSIS — R509 Fever, unspecified: Secondary | ICD-10-CM | POA: Diagnosis not present

## 2018-11-12 MED ORDER — DOXYCYCLINE HYCLATE 100 MG PO TABS: 100.0000 mg | ORAL_TABLET | Freq: Two times a day (BID) | ORAL | 0 refills | Status: DC

## 2018-11-12 NOTE — Progress Notes (Addendum)
Virtual Visit  via Phone Note  I connected with      [name]  by a telemedicine application and verified that I am speaking with the correct person using two identifiers.   I discussed the limitations of evaluation and management by telemedicine and the availability of in person appointments. The patient expressed understanding and agreed to proceed.  History of Present Illness: [name] is a 63 y.o. female who would like to discuss fever.  [name] woke this morning with fever and mild headache.  She had some ongoing mild nasal congestion and rhinitis.  She notes also chills associate with fever.  She denies significant body aches or cough.  She denies any known tick bites but does walk her dog outside in the park.  She denies known contacts to people with suspected COVID-19.  She denies any urinary symptoms of vomiting or diarrhea.  She had mastectomy about 4 weeks ago and still has some drainage from her breast but denies any skin change or redness or pain.    Observations/Objective: BP 134/76   Pulse 90   Temp (!) 101.2 F (38.4 C) (Oral)   Wt 179 lb (81.2 kg)   BMI 30.73 kg/m  Wt Readings from Last 5 Encounters:  11/12/18 179 lb (81.2 kg)  10/10/18 177 lb 8 oz (80.5 kg)  10/03/18 177 lb 8 oz (80.5 kg)  09/18/18 176 lb (79.8 kg)  09/07/18 177 lb 8 oz (80.5 kg)   Exam: Normal Speech.  Skin: Patient will take a picture uploaded to my chart which will be reviewed later today or tomorrow when it is uploaded.  Lab and Radiology Results No results found for this or any previous visit (from the past 72 hour(s)). No results found.   Assessment and Plan: 63 y.o. female with fever.  Unclear etiology.  Patient has a mild headache as well.   She is a bit immunocompromise following her breast cancer surgery and still has an active draining site from her mastectomy.  Otherwise she is not very symptomatic besides having a fever.  Differential is broad.  Tickborne illness  especially recommend spotted fever is a possibility although she does not have any known tick bites. Her symptoms are not typical for COVID-19 and she denies any significant contacts however that is a possibility as well. Additionally sinusitis is a possibility.  Plan to treat empirically with doxycycline.  This will cover [facility] spotted fever medicine for skin pathogens.  Doxycycline will also do a marginal job covering bacteria that typically cause sinusitis. Patient will check back with me especially if not improving.  Additionally will send report to her general surgeon Dr. Marlou Starks at [facility] surgery   CC: Dr Marlou Starks [facility]  Phone [phone] Fax: [phone]  PDMP not reviewed this encounter. No orders of the defined types were placed in this encounter.  Meds ordered this encounter  Medications  . doxycycline (VIBRA-TABS) 100 MG tablet    Sig: Take 1 tablet (100 mg total) by mouth 2 (two) times daily.    Dispense:  14 tablet    Refill:  0    Follow Up Instructions:    I discussed the assessment and treatment plan with the patient. The patient was provided an opportunity to ask questions and all were answered. The patient agreed with the plan and demonstrated an understanding of the instructions.   The patient was advised to call back or seek an in-person evaluation if the  symptoms worsen or if the condition fails to improve as anticipated.  I provided 25 minutes of non-face-to-face time during this encounter.    Historical information moved to improve visibility of documentation.  Past Medical History:  Diagnosis Date  . Anxiety   . Asthma   . Chest pain, atypical   . Closed head injury 06/06/2017  . Depression   . Heart palpitations    hx of MVP  . Hypertension   . IBS (irritable bowel syndrome)   . Pre-diabetes    Past Surgical History:  Procedure Laterality Date  . BREAST SURGERY  06/2018   right and left breast  . eAB     x2   . MASTECTOMY  MODIFIED RADICAL Bilateral 10/10/2018   Procedure: RIGHT MODIFIED RADICAL MASTECTOMY AND LEFT PROPHYLATIC MASTECTOMY;  Surgeon: [name], MD;  Location: [facility];  Service: General;  Laterality: Bilateral;  . PORTACATH PLACEMENT Left 08/12/2018   Procedure: INSERTION PORT-A-CATH;  Surgeon: [name], MD;  Location: [facility];  Service: General;  Laterality: Left;   Social History   Tobacco Use  . Smoking status: Never Smoker  . Smokeless tobacco: Never Used  . Tobacco comment: nonsmoker  Substance Use Topics  . Alcohol use: No   family history includes Heart attack in her father; Lung cancer in her mother.  Medications: Current Outpatient Medications  Medication Sig Dispense Refill  . albuterol (PROAIR HFA) 108 (90 Base) MCG/ACT inhaler Inhale 2 puffs into the lungs every 6 (six) hours as needed for wheezing. 1 Inhaler 6  . aspirin EC 81 MG tablet Take 81 mg by mouth 4 (four) times a week.    . B Complex-C (B-COMPLEX WITH VITAMIN C) tablet Take 1 tablet by mouth daily after lunch.    . budesonide-formoterol (SYMBICORT) 160-4.5 MCG/ACT inhaler Inhale 2 puffs into the lungs 2 (two) times daily. 3 Inhaler 1  . Calcium Ascorbate (BUFFERED C PO) Take 1 tablet by mouth daily after lunch. Buffered C-Complex    . Calcium-Magnesium (CAL-MAG PO) Take 1 tablet by mouth 2 (two) times daily.    . CHLOROPHYLL PO Take 1 tablet by mouth every other day.    . citalopram (CELEXA) 10 MG tablet Take 1 tablet (10 mg total) by mouth daily. 90 tablet 1  . Garlic Oil 5038 MG CAPS Take 1 capsule by mouth daily.     Marland Kitchen HYDROcodone-acetaminophen (NORCO/VICODIN) 5-325 MG tablet Take 1-2 tablets by mouth every 6 (six) hours as needed for moderate pain or severe pain. 10 tablet 0  . Lactobacillus (ACIDOPHILUS PO) Take 1 capsule by mouth daily.    Marland Kitchen letrozole (FEMARA) 2.5 MG tablet Take 2.5 mg by mouth daily.     Marland Kitchen lidocaine-prilocaine (EMLA) cream Apply to affected area once 30 g 3  .  lisinopril (PRINIVIL,ZESTRIL) 10 MG tablet Take 10 mg by mouth every other day. In the morning.    . loratadine (CLARITIN) 10 MG tablet Take 1 tablet (10 mg total) by mouth daily. (Patient taking differently: Take 10 mg by mouth daily as needed for allergies. ) 90 tablet 3  . LORazepam (ATIVAN) 0.5 MG tablet Take 1 tablet (0.5 mg total) by mouth at bedtime as needed (Nausea or vomiting). 30 tablet 0  . Misc Natural Products (TURMERIC CURCUMIN) CAPS Take 1 capsule by mouth daily after lunch.    . mupirocin ointment (BACTROBAN) 2 % Apply to wound 3 times daily for 5 days 30 g 0  . olopatadine (PATANOL)  0.1 % ophthalmic solution Place 1 drop into both eyes 2 (two) times daily as needed for allergies. 5 mL 12  . Omega-3 Fatty Acids (FISH OIL) 1000 MG CAPS Take 1 capsule by mouth daily. Ultimate Omega    . ondansetron (ZOFRAN) 8 MG tablet Take 1 tablet (8 mg total) by mouth 2 (two) times daily as needed. Start on the third day after chemotherapy. 30 tablet 1  . Polyethyl Glycol-Propyl Glycol (SYSTANE) 0.4-0.3 % SOLN Place 1 drop into both eyes 3 (three) times daily as needed (dry/irritated eyes.).    Marland Kitchen prochlorperazine (COMPAZINE) 10 MG tablet Take 1 tablet (10 mg total) by mouth every 6 (six) hours as needed (Nausea or vomiting). 30 tablet 1  . triamcinolone cream (KENALOG) 0.5 % Apply 1 application topically 2 (two) times daily. To affected areas. (Patient taking differently: Apply 1 application topically 2 (two) times daily as needed (skin irritated/itchy). To affected areas.) 454 g 3  . Vitamin D-Vitamin K (D3 + K2 DOTS PO) Take 1 tablet by mouth daily.    Marland Kitchen doxycycline (VIBRA-TABS) 100 MG tablet Take 1 tablet (100 mg total) by mouth 2 (two) times daily. 14 tablet 0   No current facility-administered medications for this visit.    Allergies  Allergen Reactions  . Penicillins Shortness Of Breath    Reaction as a child Did it involve swelling of the face/tongue/throat, SOB, or low BP? Yes Did it  involve sudden or severe rash/hives, skin peeling, or any reaction on the inside of your mouth or nose?Unknown Did you need to seek medical attention at a hospital or doctor's office? Yes When did it last happen? Childhood reaction. If all above answers are "NO", may proceed with cephalosporin use.   . Sulfa Antibiotics Nausea Only  . Effexor [Venlafaxine] Other (See Comments)    "dopey"                                                                    Addendum to correct incorrect date due to templating error

## 2018-11-12 NOTE — Telephone Encounter (Signed)
Patient schedule

## 2018-11-13 ENCOUNTER — Encounter: Payer: Self-pay | Admitting: Family Medicine

## 2018-11-14 DIAGNOSIS — I10 Essential (primary) hypertension: Secondary | ICD-10-CM | POA: Diagnosis not present

## 2018-11-14 DIAGNOSIS — R509 Fever, unspecified: Secondary | ICD-10-CM | POA: Diagnosis not present

## 2018-11-14 DIAGNOSIS — R05 Cough: Secondary | ICD-10-CM | POA: Diagnosis not present

## 2018-11-14 DIAGNOSIS — Z20828 Contact with and (suspected) exposure to other viral communicable diseases: Secondary | ICD-10-CM | POA: Diagnosis not present

## 2018-11-14 DIAGNOSIS — J40 Bronchitis, not specified as acute or chronic: Secondary | ICD-10-CM | POA: Diagnosis not present

## 2018-11-14 DIAGNOSIS — R6889 Other general symptoms and signs: Secondary | ICD-10-CM | POA: Diagnosis not present

## 2018-11-15 ENCOUNTER — Telehealth: Payer: Self-pay | Admitting: *Deleted

## 2018-11-15 NOTE — Telephone Encounter (Signed)
Via email, discussed with pt the importance of xrt. Discussed the "typical" treatment duration. Pt thinking of having xrt closer to home. Informed pt that would be a great option for her since [place] is a [place]min drive. Pt will consider moving forward with xrt.

## 2018-11-18 ENCOUNTER — Encounter: Payer: Self-pay | Admitting: Family Medicine

## 2018-11-19 ENCOUNTER — Encounter: Payer: Self-pay | Admitting: Family Medicine

## 2018-11-22 ENCOUNTER — Encounter: Payer: Self-pay | Admitting: Family Medicine

## 2018-11-22 MED ORDER — LISINOPRIL 10 MG PO TABS: 10.0000 mg | ORAL_TABLET | ORAL | 1 refills | Status: DC

## 2018-11-22 MED ORDER — TRIAMCINOLONE ACETONIDE 0.5 % EX CREA: 1.0000 "application " | TOPICAL_CREAM | Freq: Two times a day (BID) | CUTANEOUS | 3 refills | Status: DC

## 2018-11-27 ENCOUNTER — Encounter: Payer: Self-pay | Admitting: Family Medicine

## 2018-12-02 ENCOUNTER — Encounter: Payer: Self-pay | Admitting: Family Medicine

## 2018-12-03 ENCOUNTER — Encounter: Payer: Self-pay | Admitting: Family Medicine

## 2018-12-03 DIAGNOSIS — M25561 Pain in right knee: Secondary | ICD-10-CM | POA: Diagnosis not present

## 2018-12-03 DIAGNOSIS — M1711 Unilateral primary osteoarthritis, right knee: Secondary | ICD-10-CM | POA: Diagnosis not present

## 2018-12-05 DIAGNOSIS — Z170 Estrogen receptor positive status [ER+]: Secondary | ICD-10-CM | POA: Diagnosis not present

## 2018-12-05 DIAGNOSIS — C50919 Malignant neoplasm of unspecified site of unspecified female breast: Secondary | ICD-10-CM | POA: Diagnosis not present

## 2018-12-05 DIAGNOSIS — C50811 Malignant neoplasm of overlapping sites of right female breast: Secondary | ICD-10-CM | POA: Diagnosis not present

## 2018-12-09 ENCOUNTER — Encounter: Payer: Self-pay | Admitting: Family Medicine

## 2018-12-10 DIAGNOSIS — R590 Localized enlarged lymph nodes: Secondary | ICD-10-CM | POA: Diagnosis not present

## 2018-12-10 DIAGNOSIS — C50919 Malignant neoplasm of unspecified site of unspecified female breast: Secondary | ICD-10-CM | POA: Diagnosis not present

## 2018-12-10 DIAGNOSIS — Z170 Estrogen receptor positive status [ER+]: Secondary | ICD-10-CM | POA: Diagnosis not present

## 2018-12-10 DIAGNOSIS — C50811 Malignant neoplasm of overlapping sites of right female breast: Secondary | ICD-10-CM | POA: Diagnosis not present

## 2018-12-11 ENCOUNTER — Telehealth: Payer: Self-pay | Admitting: Cardiovascular Disease

## 2018-12-11 ENCOUNTER — Encounter: Payer: Self-pay | Admitting: Family Medicine

## 2018-12-11 NOTE — Telephone Encounter (Signed)
Patient is concerned about a recent CT she had at [facility]. Patient stated she would send results through [facility]. Patient also complaining of palpitations, which are not new. Patient would like to know Dr. Kyla Balzarine opinion on her CT and any recommendations. Since patient has not been seen in several years. Made patient a virtual visit on 12/31/18 with Dr. Johnsie Cancel. Informed patient that a message would be sent to Dr. Johnsie Cancel for further advisement.

## 2018-12-11 NOTE — Telephone Encounter (Signed)
I called her

## 2018-12-11 NOTE — Telephone Encounter (Signed)
Patient is calling because her oncologist has her on estrogen blockers and she is concerned that it will cause her to have vascular calcification because she is already showing some of that on her CT. She is also having some palpitations and would like to know if she needs to be seen.

## 2018-12-12 ENCOUNTER — Encounter: Payer: Self-pay | Admitting: Family Medicine

## 2018-12-13 ENCOUNTER — Encounter: Payer: Self-pay | Admitting: Family Medicine

## 2018-12-13 DIAGNOSIS — E785 Hyperlipidemia, unspecified: Secondary | ICD-10-CM | POA: Diagnosis not present

## 2018-12-13 DIAGNOSIS — C50811 Malignant neoplasm of overlapping sites of right female breast: Secondary | ICD-10-CM | POA: Diagnosis not present

## 2018-12-13 DIAGNOSIS — I10 Essential (primary) hypertension: Secondary | ICD-10-CM | POA: Diagnosis not present

## 2018-12-13 DIAGNOSIS — Z79899 Other long term (current) drug therapy: Secondary | ICD-10-CM | POA: Diagnosis not present

## 2018-12-13 DIAGNOSIS — Z853 Personal history of malignant neoplasm of breast: Secondary | ICD-10-CM | POA: Diagnosis not present

## 2018-12-13 DIAGNOSIS — E6609 Other obesity due to excess calories: Secondary | ICD-10-CM | POA: Diagnosis not present

## 2018-12-13 DIAGNOSIS — I110 Hypertensive heart disease with heart failure: Secondary | ICD-10-CM | POA: Diagnosis not present

## 2018-12-13 DIAGNOSIS — F419 Anxiety disorder, unspecified: Secondary | ICD-10-CM | POA: Diagnosis not present

## 2018-12-13 DIAGNOSIS — Z170 Estrogen receptor positive status [ER+]: Secondary | ICD-10-CM | POA: Diagnosis not present

## 2018-12-13 DIAGNOSIS — Z9189 Other specified personal risk factors, not elsewhere classified: Secondary | ICD-10-CM | POA: Insufficient documentation

## 2018-12-13 DIAGNOSIS — Z6830 Body mass index (BMI) 30.0-30.9, adult: Secondary | ICD-10-CM | POA: Diagnosis not present

## 2018-12-13 DIAGNOSIS — R760 Raised antibody titer: Secondary | ICD-10-CM | POA: Diagnosis not present

## 2018-12-13 DIAGNOSIS — Z5111 Encounter for antineoplastic chemotherapy: Secondary | ICD-10-CM | POA: Diagnosis not present

## 2018-12-19 ENCOUNTER — Ambulatory Visit: Payer: Self-pay | Admitting: General Surgery

## 2018-12-19 DIAGNOSIS — C773 Secondary and unspecified malignant neoplasm of axilla and upper limb lymph nodes: Secondary | ICD-10-CM | POA: Diagnosis not present

## 2018-12-19 DIAGNOSIS — C50811 Malignant neoplasm of overlapping sites of right female breast: Secondary | ICD-10-CM | POA: Diagnosis not present

## 2018-12-19 DIAGNOSIS — Z170 Estrogen receptor positive status [ER+]: Secondary | ICD-10-CM | POA: Diagnosis not present

## 2018-12-19 DIAGNOSIS — Z9013 Acquired absence of bilateral breasts and nipples: Secondary | ICD-10-CM | POA: Diagnosis not present

## 2018-12-20 ENCOUNTER — Encounter: Payer: Self-pay | Admitting: Family Medicine

## 2018-12-26 ENCOUNTER — Encounter: Payer: Self-pay | Admitting: Family Medicine

## 2018-12-30 DIAGNOSIS — I972 Postmastectomy lymphedema syndrome: Secondary | ICD-10-CM | POA: Diagnosis not present

## 2018-12-30 DIAGNOSIS — Z9013 Acquired absence of bilateral breasts and nipples: Secondary | ICD-10-CM | POA: Diagnosis not present

## 2018-12-30 DIAGNOSIS — R29898 Other symptoms and signs involving the musculoskeletal system: Secondary | ICD-10-CM | POA: Diagnosis not present

## 2018-12-30 DIAGNOSIS — C50811 Malignant neoplasm of overlapping sites of right female breast: Secondary | ICD-10-CM | POA: Diagnosis not present

## 2018-12-31 ENCOUNTER — Telehealth: Payer: BLUE CROSS/BLUE SHIELD | Admitting: Cardiovascular Disease

## 2019-01-07 DIAGNOSIS — C50811 Malignant neoplasm of overlapping sites of right female breast: Secondary | ICD-10-CM | POA: Diagnosis not present

## 2019-01-07 DIAGNOSIS — Z510 Encounter for antineoplastic radiation therapy: Secondary | ICD-10-CM | POA: Diagnosis not present

## 2019-01-08 ENCOUNTER — Encounter: Payer: Self-pay | Admitting: Family Medicine

## 2019-01-08 DIAGNOSIS — R7303 Prediabetes: Secondary | ICD-10-CM

## 2019-01-11 ENCOUNTER — Encounter: Payer: Self-pay | Admitting: Family Medicine

## 2019-01-13 ENCOUNTER — Encounter: Payer: Self-pay | Admitting: Family Medicine

## 2019-01-14 ENCOUNTER — Encounter: Payer: Self-pay | Admitting: Family Medicine

## 2019-01-14 DIAGNOSIS — M9901 Segmental and somatic dysfunction of cervical region: Secondary | ICD-10-CM | POA: Diagnosis not present

## 2019-01-14 DIAGNOSIS — M9902 Segmental and somatic dysfunction of thoracic region: Secondary | ICD-10-CM | POA: Diagnosis not present

## 2019-01-14 DIAGNOSIS — M9903 Segmental and somatic dysfunction of lumbar region: Secondary | ICD-10-CM | POA: Diagnosis not present

## 2019-01-14 DIAGNOSIS — M50321 Other cervical disc degeneration at C4-C5 level: Secondary | ICD-10-CM | POA: Diagnosis not present

## 2019-01-15 ENCOUNTER — Encounter: Payer: Self-pay | Admitting: Family Medicine

## 2019-01-16 ENCOUNTER — Encounter: Payer: Self-pay | Admitting: Family Medicine

## 2019-01-21 ENCOUNTER — Encounter: Payer: Self-pay | Admitting: Family Medicine

## 2019-01-22 ENCOUNTER — Encounter: Payer: Self-pay | Admitting: Family Medicine

## 2019-01-23 DIAGNOSIS — M50321 Other cervical disc degeneration at C4-C5 level: Secondary | ICD-10-CM | POA: Diagnosis not present

## 2019-01-23 DIAGNOSIS — M9903 Segmental and somatic dysfunction of lumbar region: Secondary | ICD-10-CM | POA: Diagnosis not present

## 2019-01-23 DIAGNOSIS — M9901 Segmental and somatic dysfunction of cervical region: Secondary | ICD-10-CM | POA: Diagnosis not present

## 2019-01-23 DIAGNOSIS — M9902 Segmental and somatic dysfunction of thoracic region: Secondary | ICD-10-CM | POA: Diagnosis not present

## 2019-01-24 ENCOUNTER — Encounter: Payer: Self-pay | Admitting: Family Medicine

## 2019-01-24 DIAGNOSIS — Z510 Encounter for antineoplastic radiation therapy: Secondary | ICD-10-CM | POA: Diagnosis not present

## 2019-01-24 DIAGNOSIS — C50811 Malignant neoplasm of overlapping sites of right female breast: Secondary | ICD-10-CM | POA: Diagnosis not present

## 2019-01-27 ENCOUNTER — Other Ambulatory Visit: Payer: Self-pay

## 2019-01-27 ENCOUNTER — Ambulatory Visit (INDEPENDENT_AMBULATORY_CARE_PROVIDER_SITE_OTHER): Payer: BC Managed Care – PPO | Admitting: Obstetrics & Gynecology

## 2019-01-27 ENCOUNTER — Encounter: Payer: Self-pay | Admitting: Obstetrics & Gynecology

## 2019-01-27 VITALS — BP 128/64 | HR 79 | Ht 64.0 in | Wt 179.0 lb

## 2019-01-27 DIAGNOSIS — Z124 Encounter for screening for malignant neoplasm of cervix: Secondary | ICD-10-CM

## 2019-01-27 DIAGNOSIS — Z1151 Encounter for screening for human papillomavirus (HPV): Secondary | ICD-10-CM

## 2019-01-27 DIAGNOSIS — Z01419 Encounter for gynecological examination (general) (routine) without abnormal findings: Secondary | ICD-10-CM

## 2019-01-27 DIAGNOSIS — C50811 Malignant neoplasm of overlapping sites of right female breast: Secondary | ICD-10-CM | POA: Diagnosis not present

## 2019-01-27 DIAGNOSIS — Z510 Encounter for antineoplastic radiation therapy: Secondary | ICD-10-CM | POA: Diagnosis not present

## 2019-01-27 NOTE — Progress Notes (Signed)
Subjective:     [name] is a 63 y.o. female here for a routine exam.  Current complaints:   Gynecologic History No LMP recorded. Patient is postmenopausal. Contraception: none Last Pap: 02/2017. Results were: normal Last mammogram: underwent mastectomy for breast cancer 3/20; is doing radiation; stopped chemo after one cycle.   Obstetric History OB History  Gravida Para Term Preterm AB Living  3 1 1   2 1   SAB TAB Ectopic Multiple Live Births    2          # Outcome Date GA Lbr Len/2nd Weight Sex Delivery Anes PTL Lv  3 TAB           2 TAB           1 Term              The following portions of the patient's history were reviewed and updated as appropriate: allergies, current medications, past family history, past medical history, past social history, past surgical history and problem list.  Review of Systems Pertinent items noted in HPI and remainder of comprehensive ROS otherwise negative.    Objective:      Vitals:   01/27/19 1417  BP: 128/64  Pulse: 79  Weight: 179 lb (81.2 kg)  Height: 5\' 4"  (1.626 m)   Vitals:  WNL General appearance: alert, cooperative and no distress  HEENT: Normocephalic, without obvious abnormality, atraumatic Eyes: negative  Respiratory: Clear to auscultation bilaterally  CV: Regular rate and rhythm  Breasts:  S/p mastectomy; defers exam for now  GI: Soft, non-tender; bowel sounds normal; no masses,  no organomegaly  GU: External Genitalia:  Tanner V, no lesion Urethra:  No prolapse   Vagina: no blood or discharge; arophic  Cervix: No CMT, no lesion--does not emit pap smear brush  Uterus:  Normal size and contour, non tender  Adnexa: Normal, no masses, non tender  Musculoskeletal: No edema, redness or tenderness in the calves or thighs  Skin: No lesions or rash  Lymphatic: Axillary adenopathy: none     Psychiatric: Normal mood and behavior        Assessment:    Healthy female exam.   Undergoing treatment for breast  cancer   Plan:    Pap smear today; it is early, and patient desires given recent events. Pt needs colonoscopy (never had one) Pt will follow up with [place] as needed.

## 2019-01-27 NOTE — Progress Notes (Signed)
Last pap- 03/28/17- negative

## 2019-01-27 NOTE — Addendum Note (Signed)
Addended by: Asencion Islam on: 01/27/2019 03:02 PM   Modules accepted: Orders

## 2019-01-28 DIAGNOSIS — Z510 Encounter for antineoplastic radiation therapy: Secondary | ICD-10-CM | POA: Diagnosis not present

## 2019-01-28 DIAGNOSIS — C50811 Malignant neoplasm of overlapping sites of right female breast: Secondary | ICD-10-CM | POA: Diagnosis not present

## 2019-01-29 DIAGNOSIS — C50811 Malignant neoplasm of overlapping sites of right female breast: Secondary | ICD-10-CM | POA: Diagnosis not present

## 2019-01-29 DIAGNOSIS — Z510 Encounter for antineoplastic radiation therapy: Secondary | ICD-10-CM | POA: Diagnosis not present

## 2019-01-29 LAB — CYTOLOGY - PAP
Diagnosis: NEGATIVE
HPV: NOT DETECTED

## 2019-01-30 DIAGNOSIS — Z510 Encounter for antineoplastic radiation therapy: Secondary | ICD-10-CM | POA: Diagnosis not present

## 2019-01-30 DIAGNOSIS — C50811 Malignant neoplasm of overlapping sites of right female breast: Secondary | ICD-10-CM | POA: Diagnosis not present

## 2019-02-01 ENCOUNTER — Encounter: Payer: Self-pay | Admitting: Family Medicine

## 2019-02-03 ENCOUNTER — Encounter: Payer: Self-pay | Admitting: Family Medicine

## 2019-02-03 DIAGNOSIS — Z510 Encounter for antineoplastic radiation therapy: Secondary | ICD-10-CM | POA: Diagnosis not present

## 2019-02-03 DIAGNOSIS — C50811 Malignant neoplasm of overlapping sites of right female breast: Secondary | ICD-10-CM | POA: Diagnosis not present

## 2019-02-04 ENCOUNTER — Encounter: Payer: Self-pay | Admitting: Family Medicine

## 2019-02-04 DIAGNOSIS — Z510 Encounter for antineoplastic radiation therapy: Secondary | ICD-10-CM | POA: Diagnosis not present

## 2019-02-04 DIAGNOSIS — C50811 Malignant neoplasm of overlapping sites of right female breast: Secondary | ICD-10-CM | POA: Diagnosis not present

## 2019-02-05 ENCOUNTER — Encounter: Payer: Self-pay | Admitting: Family Medicine

## 2019-02-05 DIAGNOSIS — Z510 Encounter for antineoplastic radiation therapy: Secondary | ICD-10-CM | POA: Diagnosis not present

## 2019-02-05 DIAGNOSIS — C50811 Malignant neoplasm of overlapping sites of right female breast: Secondary | ICD-10-CM | POA: Diagnosis not present

## 2019-02-06 ENCOUNTER — Encounter: Payer: Self-pay | Admitting: Family Medicine

## 2019-02-06 DIAGNOSIS — Z510 Encounter for antineoplastic radiation therapy: Secondary | ICD-10-CM | POA: Diagnosis not present

## 2019-02-06 DIAGNOSIS — C50811 Malignant neoplasm of overlapping sites of right female breast: Secondary | ICD-10-CM | POA: Diagnosis not present

## 2019-02-07 ENCOUNTER — Encounter: Payer: Self-pay | Admitting: Family Medicine

## 2019-02-07 ENCOUNTER — Other Ambulatory Visit: Payer: Self-pay

## 2019-02-07 ENCOUNTER — Ambulatory Visit (INDEPENDENT_AMBULATORY_CARE_PROVIDER_SITE_OTHER): Payer: BC Managed Care – PPO | Admitting: Family Medicine

## 2019-02-07 VITALS — BP 146/59 | HR 75 | Temp 98.5°F | Ht 64.0 in | Wt 176.0 lb

## 2019-02-07 DIAGNOSIS — Z170 Estrogen receptor positive status [ER+]: Secondary | ICD-10-CM

## 2019-02-07 DIAGNOSIS — Z6830 Body mass index (BMI) 30.0-30.9, adult: Secondary | ICD-10-CM | POA: Diagnosis not present

## 2019-02-07 DIAGNOSIS — C50411 Malignant neoplasm of upper-outer quadrant of right female breast: Secondary | ICD-10-CM

## 2019-02-07 DIAGNOSIS — R7303 Prediabetes: Secondary | ICD-10-CM | POA: Diagnosis not present

## 2019-02-07 DIAGNOSIS — I10 Essential (primary) hypertension: Secondary | ICD-10-CM | POA: Diagnosis not present

## 2019-02-07 LAB — POCT GLYCOSYLATED HEMOGLOBIN (HGB A1C): Hemoglobin A1C: 6.4 % — AB (ref 4.0–5.6)

## 2019-02-07 NOTE — Progress Notes (Signed)
[name] is a 63 y.o. female who presents to [facility] today for follow-up breast cancer, hypertension, prediabetes, and discuss a small bump on her left foot.  [name] is currently undergoing adjuvant radiation therapy following her breast cancer lumpectomy for her right breast.  She is had 9 sessions so far.  She is feeling pretty well but in general is uncomfortable with the basic concepts behind chemotherapy or radiation therapy.  However she has had discussions with her radiation oncologist and agrees to continue the treatment.  She wants to take antioxidants to improve her overall health but was advised against taking antioxidants at least while radiation therapy was ongoing.  She is a bit skeptical but is willing to concede the point.  Overall she thinks she is doing pretty well.  Additionally she notes that she is trying to improve her diet.  She notes she has a history of prediabetes and worried that she may be worsening.  She is tried to eat healthy diet with lots of fruits and vegetables and low and sugars and starches.  She exercises some too.  She checks her blood pressure at home.  She notes her blood pressure at home is typically in the 130s.  She denies chest pain palpitation shortness of breath.  She takes lisinopril as below.  Additionally she notes a small bump at the plantar foot just distal to the fourth MTP or metatarsal head.  She has this is mildly painful.  Has not tried any treatment yet.  Is been there for a few weeks now.    ROS as above:  Exam:  BP (!) 146/59    Pulse 75    Temp 98.5 F (36.9 C) (Oral)    Ht 5\' 4"  (1.626 m)    Wt 176 lb (79.8 kg)    SpO2 98%    BMI 30.21 kg/m  Wt Readings from Last 5 Encounters:  02/07/19 176 lb (79.8 kg)  01/27/19 179 lb (81.2 kg)  11/12/18 179 lb (81.2 kg)  10/10/18 177 lb 8 oz (80.5 kg)  10/03/18 177  lb 8 oz (80.5 kg)    Gen: Well NAD HEENT: EOMI,  MMM Lungs: Normal work of breathing. CTABL Heart: RRR no MRG Abd: NABS, Soft. Nondistended, Nontender Exts: Brisk capillary refill, warm and well perfused.   Right foot: Small callus just distal to the left fourth MTP plantar.  Minimally tender.  No surrounding erythema or induration.  Lab and Radiology Results Results for orders placed or performed in visit on 02/07/19 (from the past 72 hour(s))  POCT glycosylated hemoglobin (Hb A1C)     Status: Abnormal   Collection Time: 02/07/19  9:31 AM  Result Value Ref Range   Hemoglobin A1C 6.4 (A) 4.0 - 5.6 %   HbA1c POC (<> result, manual entry)     HbA1c, POC (prediabetic range)     HbA1c, POC (controlled diabetic range)     No results found.    Assessment and Plan: 63 y.o. female with  Breast cancer: Doing reasonably well.  Patient did not complete her neoadjuvant chemotherapy protocol and is reluctantly undergoing her adjuvant radiation therapy following her lumpectomy.  She is done a lot of reading on her own and has lots of questions about the limitations of modern cancer therapy and the potential benefits of complementary or alternative medicines as well as a holistic medicine approach.  However she remains pretty reasonable after discussion.  Fortunately she has a good radiation oncologist she is willing to continue to discuss the therapy for her.  After discussion she agrees to continue x-ray therapy to minimize chance of recurrence.   Prediabetes and obesity: Prediabetes is stable to slightly worsened.  A1c 6.4 today versus 6.2 previously.  Patient has a reasonably healthy diet.  I think the benefits of fruits and vegetables are certainly worth the slight increased amount of carbs she is getting from the natural plant sugars.  Her diet is relatively low glycemic index and certainly low in processed starches and sugars.  I think if she were not careful about her diet and also continue to  exercise her weight would be higher and her A1c would be worse.  Discussed treatment strategies and goals.  Hypertension: Blood pressure is a bit elevated today.  Patient would like to proceed with watchful waiting with lifestyle modification.  Recheck in about 3 months.  Callus plantar foot.  Discussed strategies to treat callus including emery board, corn pads, or even metatarsal pads into the future if needed.  PDMP not reviewed this encounter. Orders Placed This Encounter  Procedures   POCT glycosylated hemoglobin (Hb A1C)   No orders of the defined types were placed in this encounter.    Historical information moved to improve visibility of documentation.  Past Medical History:  Diagnosis Date   Anxiety    Asthma    Breast cancer ([place])    Chest pain, atypical    Closed head injury 06/06/2017   Depression    Heart palpitations    hx of MVP   Hypertension    IBS (irritable bowel syndrome)    Pre-diabetes    Past Surgical History:  Procedure Laterality Date   BREAST SURGERY  06/2018   right and left breast   eAB     x2    MASTECTOMY MODIFIED RADICAL Bilateral 10/10/2018   Procedure: RIGHT MODIFIED RADICAL MASTECTOMY AND LEFT PROPHYLATIC MASTECTOMY;  Surgeon: [name], MD;  Location: [facility];  Service: General;  Laterality: Bilateral;   PORTACATH PLACEMENT Left 08/12/2018   Procedure: INSERTION PORT-A-CATH;  Surgeon: [name], MD;  Location: [facility] [place];  Service: General;  Laterality: Left;   Social History   Tobacco Use   Smoking status: Never Smoker   Smokeless tobacco: Never Used   Tobacco comment: nonsmoker  Substance Use Topics   Alcohol use: No   family history includes Bladder Cancer in her brother; Heart attack in her father; Lung cancer in her mother.  Medications: Current Outpatient Medications  Medication Sig Dispense Refill   albuterol (PROAIR HFA) 108 (90 Base) MCG/ACT inhaler Inhale 2 puffs into the lungs  every 6 (six) hours as needed for wheezing. 1 Inhaler 6   aspirin EC 81 MG tablet Take 81 mg by mouth 4 (four) times a week.     B Complex-C (B-COMPLEX WITH VITAMIN C) tablet Take 1 tablet by mouth daily after lunch.     Calcium Ascorbate (BUFFERED C PO) Take 1 tablet by mouth daily after lunch. Buffered C-Complex     Calcium-Magnesium (CAL-MAG PO) Take 1 tablet by mouth 2 (two) times daily.     CHLOROPHYLL PO Take 1 tablet by mouth every other day.     citalopram (CELEXA) 10 MG tablet Take 1 tablet (10 mg total) by mouth daily. 90 tablet 1   doxycycline (VIBRA-TABS) 100 MG tablet Take 1 tablet (100 mg total) by mouth 2 (two) times daily. [place]  tablet 0   Garlic Oil 3536 MG CAPS Take 1 capsule by mouth daily.      HYDROcodone-acetaminophen (NORCO/VICODIN) 5-325 MG tablet Take 1-2 tablets by mouth every 6 (six) hours as needed for moderate pain or severe pain. 10 tablet 0   Lactobacillus (ACIDOPHILUS PO) Take 1 capsule by mouth daily.     letrozole (FEMARA) 2.5 MG tablet Take 2.5 mg by mouth daily.      lidocaine-prilocaine (EMLA) cream Apply to affected area once 30 g 3   lisinopril (ZESTRIL) 10 MG tablet Take 1 tablet (10 mg total) by mouth every other day. 90 tablet 1   loratadine (CLARITIN) 10 MG tablet Take 1 tablet (10 mg total) by mouth daily. (Patient taking differently: Take 10 mg by mouth daily as needed for allergies. ) 90 tablet 3   LORazepam (ATIVAN) 0.5 MG tablet Take 1 tablet (0.5 mg total) by mouth at bedtime as needed (Nausea or vomiting). 30 tablet 0   Misc Natural Products (TURMERIC CURCUMIN) CAPS Take 1 capsule by mouth daily after lunch.     mupirocin ointment (BACTROBAN) 2 % Apply to wound 3 times daily for 5 days 30 g 0   olopatadine (PATANOL) 0.1 % ophthalmic solution Place 1 drop into both eyes 2 (two) times daily as needed for allergies. 5 mL 12   Omega-3 Fatty Acids (FISH OIL) 1000 MG CAPS Take 1 capsule by mouth daily. Ultimate Omega     ondansetron  (ZOFRAN) 8 MG tablet Take 1 tablet (8 mg total) by mouth 2 (two) times daily as needed. Start on the third day after chemotherapy. 30 tablet 1   Polyethyl Glycol-Propyl Glycol (SYSTANE) 0.4-0.3 % SOLN Place 1 drop into both eyes 3 (three) times daily as needed (dry/irritated eyes.).     prochlorperazine (COMPAZINE) 10 MG tablet Take 1 tablet (10 mg total) by mouth every 6 (six) hours as needed (Nausea or vomiting). 30 tablet 1   triamcinolone cream (KENALOG) 0.5 % Apply 1 application topically 2 (two) times daily. To affected areas. 454 g 3   Vitamin D-Vitamin K (D3 + K2 DOTS PO) Take 1 tablet by mouth daily.     budesonide-formoterol (SYMBICORT) 160-4.5 MCG/ACT inhaler Inhale 2 puffs into the lungs 2 (two) times daily. 3 Inhaler 1   No current facility-administered medications for this visit.    Allergies  Allergen Reactions   Penicillins Shortness Of Breath    Reaction as a child Did it involve swelling of the face/tongue/throat, SOB, or low BP? Yes Did it involve sudden or severe rash/hives, skin peeling, or any reaction on the inside of your mouth or nose?Unknown Did you need to seek medical attention at a hospital or doctor's office? Yes When did it last happen? Childhood reaction. If all above answers are NO, may proceed with cephalosporin use.    Sulfa Antibiotics Nausea Only   Effexor [Venlafaxine] Other (See Comments)    "dopey"     Discussed warning signs or symptoms. Please see discharge instructions. Patient expresses understanding.

## 2019-02-07 NOTE — Patient Instructions (Addendum)
Thank you for coming in today.  Try to continue radiation therapy.  Keep track of blood pressure. If not improving after radiation ends we will consider adjusting medicine.   Keep the carbs low.   Recheck in a few months or sooner if needed.   Consider flu vaccine this fall.   Keep me updated.

## 2019-02-10 ENCOUNTER — Encounter: Payer: Self-pay | Admitting: Family Medicine

## 2019-02-10 DIAGNOSIS — C50811 Malignant neoplasm of overlapping sites of right female breast: Secondary | ICD-10-CM | POA: Diagnosis not present

## 2019-02-10 DIAGNOSIS — Z510 Encounter for antineoplastic radiation therapy: Secondary | ICD-10-CM | POA: Diagnosis not present

## 2019-02-11 DIAGNOSIS — M50321 Other cervical disc degeneration at C4-C5 level: Secondary | ICD-10-CM | POA: Diagnosis not present

## 2019-02-11 DIAGNOSIS — C50811 Malignant neoplasm of overlapping sites of right female breast: Secondary | ICD-10-CM | POA: Diagnosis not present

## 2019-02-11 DIAGNOSIS — Z510 Encounter for antineoplastic radiation therapy: Secondary | ICD-10-CM | POA: Diagnosis not present

## 2019-02-11 DIAGNOSIS — M9902 Segmental and somatic dysfunction of thoracic region: Secondary | ICD-10-CM | POA: Diagnosis not present

## 2019-02-11 DIAGNOSIS — M9903 Segmental and somatic dysfunction of lumbar region: Secondary | ICD-10-CM | POA: Diagnosis not present

## 2019-02-11 DIAGNOSIS — M9901 Segmental and somatic dysfunction of cervical region: Secondary | ICD-10-CM | POA: Diagnosis not present

## 2019-02-12 DIAGNOSIS — Z510 Encounter for antineoplastic radiation therapy: Secondary | ICD-10-CM | POA: Diagnosis not present

## 2019-02-12 DIAGNOSIS — C50811 Malignant neoplasm of overlapping sites of right female breast: Secondary | ICD-10-CM | POA: Diagnosis not present

## 2019-02-13 ENCOUNTER — Ambulatory Visit: Payer: BC Managed Care – PPO | Admitting: Family Medicine

## 2019-02-13 ENCOUNTER — Encounter: Payer: Self-pay | Admitting: Family Medicine

## 2019-02-13 DIAGNOSIS — Z1159 Encounter for screening for other viral diseases: Secondary | ICD-10-CM | POA: Diagnosis not present

## 2019-02-15 ENCOUNTER — Encounter: Payer: Self-pay | Admitting: Family Medicine

## 2019-02-17 DIAGNOSIS — Z510 Encounter for antineoplastic radiation therapy: Secondary | ICD-10-CM | POA: Diagnosis not present

## 2019-02-17 DIAGNOSIS — Z853 Personal history of malignant neoplasm of breast: Secondary | ICD-10-CM | POA: Diagnosis not present

## 2019-02-17 DIAGNOSIS — C50811 Malignant neoplasm of overlapping sites of right female breast: Secondary | ICD-10-CM | POA: Diagnosis not present

## 2019-02-17 DIAGNOSIS — Z452 Encounter for adjustment and management of vascular access device: Secondary | ICD-10-CM | POA: Diagnosis not present

## 2019-02-18 DIAGNOSIS — Z510 Encounter for antineoplastic radiation therapy: Secondary | ICD-10-CM | POA: Diagnosis not present

## 2019-02-18 DIAGNOSIS — C50811 Malignant neoplasm of overlapping sites of right female breast: Secondary | ICD-10-CM | POA: Diagnosis not present

## 2019-02-20 DIAGNOSIS — M9901 Segmental and somatic dysfunction of cervical region: Secondary | ICD-10-CM | POA: Diagnosis not present

## 2019-02-20 DIAGNOSIS — C50811 Malignant neoplasm of overlapping sites of right female breast: Secondary | ICD-10-CM | POA: Diagnosis not present

## 2019-02-20 DIAGNOSIS — M9903 Segmental and somatic dysfunction of lumbar region: Secondary | ICD-10-CM | POA: Diagnosis not present

## 2019-02-20 DIAGNOSIS — M9902 Segmental and somatic dysfunction of thoracic region: Secondary | ICD-10-CM | POA: Diagnosis not present

## 2019-02-20 DIAGNOSIS — Z510 Encounter for antineoplastic radiation therapy: Secondary | ICD-10-CM | POA: Diagnosis not present

## 2019-02-20 DIAGNOSIS — M50321 Other cervical disc degeneration at C4-C5 level: Secondary | ICD-10-CM | POA: Diagnosis not present

## 2019-02-21 DIAGNOSIS — C50811 Malignant neoplasm of overlapping sites of right female breast: Secondary | ICD-10-CM | POA: Diagnosis not present

## 2019-02-21 DIAGNOSIS — Z510 Encounter for antineoplastic radiation therapy: Secondary | ICD-10-CM | POA: Diagnosis not present

## 2019-02-24 DIAGNOSIS — Z510 Encounter for antineoplastic radiation therapy: Secondary | ICD-10-CM | POA: Diagnosis not present

## 2019-02-24 DIAGNOSIS — C50811 Malignant neoplasm of overlapping sites of right female breast: Secondary | ICD-10-CM | POA: Diagnosis not present

## 2019-02-25 DIAGNOSIS — Z510 Encounter for antineoplastic radiation therapy: Secondary | ICD-10-CM | POA: Diagnosis not present

## 2019-02-25 DIAGNOSIS — C50811 Malignant neoplasm of overlapping sites of right female breast: Secondary | ICD-10-CM | POA: Diagnosis not present

## 2019-02-26 DIAGNOSIS — Z510 Encounter for antineoplastic radiation therapy: Secondary | ICD-10-CM | POA: Diagnosis not present

## 2019-02-26 DIAGNOSIS — C50811 Malignant neoplasm of overlapping sites of right female breast: Secondary | ICD-10-CM | POA: Diagnosis not present

## 2019-02-27 DIAGNOSIS — Z9189 Other specified personal risk factors, not elsewhere classified: Secondary | ICD-10-CM | POA: Diagnosis not present

## 2019-02-27 DIAGNOSIS — Z510 Encounter for antineoplastic radiation therapy: Secondary | ICD-10-CM | POA: Diagnosis not present

## 2019-02-27 DIAGNOSIS — C50811 Malignant neoplasm of overlapping sites of right female breast: Secondary | ICD-10-CM | POA: Diagnosis not present

## 2019-02-27 DIAGNOSIS — Z9013 Acquired absence of bilateral breasts and nipples: Secondary | ICD-10-CM | POA: Diagnosis not present

## 2019-02-27 DIAGNOSIS — Z170 Estrogen receptor positive status [ER+]: Secondary | ICD-10-CM | POA: Diagnosis not present

## 2019-03-01 ENCOUNTER — Encounter: Payer: Self-pay | Admitting: Family Medicine

## 2019-03-03 DIAGNOSIS — C50811 Malignant neoplasm of overlapping sites of right female breast: Secondary | ICD-10-CM | POA: Diagnosis not present

## 2019-03-03 DIAGNOSIS — Z510 Encounter for antineoplastic radiation therapy: Secondary | ICD-10-CM | POA: Diagnosis not present

## 2019-03-04 DIAGNOSIS — Z510 Encounter for antineoplastic radiation therapy: Secondary | ICD-10-CM | POA: Diagnosis not present

## 2019-03-04 DIAGNOSIS — C50811 Malignant neoplasm of overlapping sites of right female breast: Secondary | ICD-10-CM | POA: Diagnosis not present

## 2019-03-05 ENCOUNTER — Encounter: Payer: Self-pay | Admitting: Family Medicine

## 2019-03-05 DIAGNOSIS — Z510 Encounter for antineoplastic radiation therapy: Secondary | ICD-10-CM | POA: Diagnosis not present

## 2019-03-05 DIAGNOSIS — C50811 Malignant neoplasm of overlapping sites of right female breast: Secondary | ICD-10-CM | POA: Diagnosis not present

## 2019-03-06 DIAGNOSIS — Z510 Encounter for antineoplastic radiation therapy: Secondary | ICD-10-CM | POA: Diagnosis not present

## 2019-03-06 DIAGNOSIS — C50811 Malignant neoplasm of overlapping sites of right female breast: Secondary | ICD-10-CM | POA: Diagnosis not present

## 2019-03-09 ENCOUNTER — Encounter: Payer: Self-pay | Admitting: Family Medicine

## 2019-03-10 ENCOUNTER — Encounter: Payer: Self-pay | Admitting: Family Medicine

## 2019-03-11 DIAGNOSIS — C50811 Malignant neoplasm of overlapping sites of right female breast: Secondary | ICD-10-CM | POA: Diagnosis not present

## 2019-03-11 DIAGNOSIS — M50321 Other cervical disc degeneration at C4-C5 level: Secondary | ICD-10-CM | POA: Diagnosis not present

## 2019-03-11 DIAGNOSIS — M9901 Segmental and somatic dysfunction of cervical region: Secondary | ICD-10-CM | POA: Diagnosis not present

## 2019-03-11 DIAGNOSIS — M9903 Segmental and somatic dysfunction of lumbar region: Secondary | ICD-10-CM | POA: Diagnosis not present

## 2019-03-11 DIAGNOSIS — M9902 Segmental and somatic dysfunction of thoracic region: Secondary | ICD-10-CM | POA: Diagnosis not present

## 2019-03-12 ENCOUNTER — Encounter: Payer: Self-pay | Admitting: Family Medicine

## 2019-03-12 ENCOUNTER — Ambulatory Visit: Payer: BC Managed Care – PPO | Admitting: Family Medicine

## 2019-03-13 ENCOUNTER — Encounter: Payer: Self-pay | Admitting: Family Medicine

## 2019-03-13 DIAGNOSIS — S21101S Unspecified open wound of right front wall of thorax without penetration into thoracic cavity, sequela: Secondary | ICD-10-CM | POA: Diagnosis not present

## 2019-03-13 DIAGNOSIS — I10 Essential (primary) hypertension: Secondary | ICD-10-CM | POA: Diagnosis not present

## 2019-03-13 DIAGNOSIS — T300 Burn of unspecified body region, unspecified degree: Secondary | ICD-10-CM | POA: Diagnosis not present

## 2019-03-17 DIAGNOSIS — C50811 Malignant neoplasm of overlapping sites of right female breast: Secondary | ICD-10-CM | POA: Diagnosis not present

## 2019-03-17 DIAGNOSIS — T300 Burn of unspecified body region, unspecified degree: Secondary | ICD-10-CM | POA: Diagnosis not present

## 2019-03-17 DIAGNOSIS — Z510 Encounter for antineoplastic radiation therapy: Secondary | ICD-10-CM | POA: Diagnosis not present

## 2019-03-17 DIAGNOSIS — I10 Essential (primary) hypertension: Secondary | ICD-10-CM | POA: Diagnosis not present

## 2019-03-18 DIAGNOSIS — Z510 Encounter for antineoplastic radiation therapy: Secondary | ICD-10-CM | POA: Diagnosis not present

## 2019-03-18 DIAGNOSIS — C50811 Malignant neoplasm of overlapping sites of right female breast: Secondary | ICD-10-CM | POA: Diagnosis not present

## 2019-03-19 DIAGNOSIS — Z510 Encounter for antineoplastic radiation therapy: Secondary | ICD-10-CM | POA: Diagnosis not present

## 2019-03-19 DIAGNOSIS — C50811 Malignant neoplasm of overlapping sites of right female breast: Secondary | ICD-10-CM | POA: Diagnosis not present

## 2019-03-20 DIAGNOSIS — Z510 Encounter for antineoplastic radiation therapy: Secondary | ICD-10-CM | POA: Diagnosis not present

## 2019-03-20 DIAGNOSIS — C50811 Malignant neoplasm of overlapping sites of right female breast: Secondary | ICD-10-CM | POA: Diagnosis not present

## 2019-03-21 ENCOUNTER — Encounter: Payer: Self-pay | Admitting: Family Medicine

## 2019-03-21 DIAGNOSIS — Z510 Encounter for antineoplastic radiation therapy: Secondary | ICD-10-CM | POA: Diagnosis not present

## 2019-03-21 DIAGNOSIS — C50811 Malignant neoplasm of overlapping sites of right female breast: Secondary | ICD-10-CM | POA: Diagnosis not present

## 2019-03-24 MED ORDER — BUDESONIDE-FORMOTEROL FUMARATE 160-4.5 MCG/ACT IN AERO: 2.0000 | INHALATION_SPRAY | Freq: Two times a day (BID) | RESPIRATORY_TRACT | 3 refills | Status: DC

## 2019-03-25 DIAGNOSIS — M50321 Other cervical disc degeneration at C4-C5 level: Secondary | ICD-10-CM | POA: Diagnosis not present

## 2019-03-25 DIAGNOSIS — M9903 Segmental and somatic dysfunction of lumbar region: Secondary | ICD-10-CM | POA: Diagnosis not present

## 2019-03-25 DIAGNOSIS — M9901 Segmental and somatic dysfunction of cervical region: Secondary | ICD-10-CM | POA: Diagnosis not present

## 2019-03-25 DIAGNOSIS — M9902 Segmental and somatic dysfunction of thoracic region: Secondary | ICD-10-CM | POA: Diagnosis not present

## 2019-03-27 ENCOUNTER — Encounter: Payer: Self-pay | Admitting: Family Medicine

## 2019-03-28 MED ORDER — BUDESONIDE-FORMOTEROL FUMARATE 160-4.5 MCG/ACT IN AERO: 2.0000 | INHALATION_SPRAY | Freq: Two times a day (BID) | RESPIRATORY_TRACT | 3 refills | Status: DC

## 2019-04-03 DIAGNOSIS — C50811 Malignant neoplasm of overlapping sites of right female breast: Secondary | ICD-10-CM | POA: Diagnosis not present

## 2019-04-04 ENCOUNTER — Encounter: Payer: Self-pay | Admitting: Family Medicine

## 2019-04-05 ENCOUNTER — Encounter: Payer: Self-pay | Admitting: Family Medicine

## 2019-04-08 MED ORDER — CITALOPRAM HYDROBROMIDE 10 MG PO TABS: 10.0000 mg | ORAL_TABLET | Freq: Every day | ORAL | 1 refills | Status: DC

## 2019-04-08 MED ORDER — BUDESONIDE-FORMOTEROL FUMARATE 160-4.5 MCG/ACT IN AERO: 2.0000 | INHALATION_SPRAY | Freq: Two times a day (BID) | RESPIRATORY_TRACT | 3 refills | Status: DC

## 2019-04-08 MED ORDER — LISINOPRIL 10 MG PO TABS: 10.0000 mg | ORAL_TABLET | ORAL | 1 refills | Status: DC

## 2019-04-10 DIAGNOSIS — E6609 Other obesity due to excess calories: Secondary | ICD-10-CM | POA: Diagnosis not present

## 2019-04-10 DIAGNOSIS — Z9221 Personal history of antineoplastic chemotherapy: Secondary | ICD-10-CM | POA: Diagnosis not present

## 2019-04-10 DIAGNOSIS — I10 Essential (primary) hypertension: Secondary | ICD-10-CM | POA: Diagnosis not present

## 2019-04-10 DIAGNOSIS — Z923 Personal history of irradiation: Secondary | ICD-10-CM | POA: Diagnosis not present

## 2019-04-10 DIAGNOSIS — Z6830 Body mass index (BMI) 30.0-30.9, adult: Secondary | ICD-10-CM | POA: Diagnosis not present

## 2019-04-10 DIAGNOSIS — Z9189 Other specified personal risk factors, not elsewhere classified: Secondary | ICD-10-CM | POA: Diagnosis not present

## 2019-04-10 DIAGNOSIS — F331 Major depressive disorder, recurrent, moderate: Secondary | ICD-10-CM | POA: Diagnosis not present

## 2019-04-10 DIAGNOSIS — Z79811 Long term (current) use of aromatase inhibitors: Secondary | ICD-10-CM | POA: Diagnosis not present

## 2019-04-10 DIAGNOSIS — Z170 Estrogen receptor positive status [ER+]: Secondary | ICD-10-CM | POA: Diagnosis not present

## 2019-04-10 DIAGNOSIS — C50811 Malignant neoplasm of overlapping sites of right female breast: Secondary | ICD-10-CM | POA: Diagnosis not present

## 2019-04-16 ENCOUNTER — Encounter: Payer: Self-pay | Admitting: Family Medicine

## 2019-04-17 DIAGNOSIS — C50911 Malignant neoplasm of unspecified site of right female breast: Secondary | ICD-10-CM | POA: Diagnosis not present

## 2019-04-17 DIAGNOSIS — Z9012 Acquired absence of left breast and nipple: Secondary | ICD-10-CM | POA: Diagnosis not present

## 2019-04-22 ENCOUNTER — Encounter: Payer: Self-pay | Admitting: Family Medicine

## 2019-04-22 ENCOUNTER — Other Ambulatory Visit: Payer: Self-pay

## 2019-04-22 DIAGNOSIS — R6889 Other general symptoms and signs: Secondary | ICD-10-CM | POA: Diagnosis not present

## 2019-04-22 DIAGNOSIS — Z20822 Contact with and (suspected) exposure to covid-19: Secondary | ICD-10-CM

## 2019-04-23 ENCOUNTER — Encounter: Payer: Self-pay | Admitting: Family Medicine

## 2019-04-23 LAB — NOVEL CORONAVIRUS, NAA: SARS-CoV-2, NAA: NOT DETECTED

## 2019-04-24 ENCOUNTER — Encounter: Payer: Self-pay | Admitting: Family Medicine

## 2019-04-24 ENCOUNTER — Ambulatory Visit (INDEPENDENT_AMBULATORY_CARE_PROVIDER_SITE_OTHER): Payer: BC Managed Care – PPO | Admitting: Family Medicine

## 2019-04-24 VITALS — BP 111/71 | HR 76 | Wt 178.0 lb

## 2019-04-24 DIAGNOSIS — L235 Allergic contact dermatitis due to other chemical products: Secondary | ICD-10-CM | POA: Diagnosis not present

## 2019-04-24 MED ORDER — DOXYCYCLINE HYCLATE 100 MG PO TABS: 100.0000 mg | ORAL_TABLET | Freq: Two times a day (BID) | ORAL | 0 refills | Status: DC

## 2019-04-24 MED ORDER — TRIAMCINOLONE ACETONIDE 0.5 % EX CREA: 1.0000 "application " | TOPICAL_CREAM | Freq: Two times a day (BID) | CUTANEOUS | 3 refills | Status: DC

## 2019-04-24 NOTE — Patient Instructions (Addendum)
Thank you for coming in today. Apply triamcinolone cream 0.5% If you do not have the 0.5% cream at home use the printed prescription.  If not getting better take oral doxycycline antibiotic.  Keep me upated.  Try to figure out what the breast prosthesis is made of. You may have a latex allergy.  Ok to use a liner.    Contact Dermatitis Dermatitis is redness, soreness, and swelling (inflammation) of the skin. Contact dermatitis is a reaction to something that touches the skin. There are two types of contact dermatitis:  Irritant contact dermatitis. This happens when something bothers (irritates) your skin, like soap.  Allergic contact dermatitis. This is caused when you are exposed to something that you are allergic to, such as poison ivy. What are the causes?  Common causes of irritant contact dermatitis include: ? Makeup. ? Soaps. ? Detergents. ? Bleaches. ? Acids. ? Metals, such as nickel.  Common causes of allergic contact dermatitis include: ? Plants. ? Chemicals. ? Jewelry. ? Latex. ? Medicines. ? Preservatives in products, such as clothing. What increases the risk?  Having a job that exposes you to things that bother your skin.  Having asthma or eczema. What are the signs or symptoms? Symptoms may happen anywhere the irritant has touched your skin. Symptoms include:  Dry or flaky skin.  Redness.  Cracks.  Itching.  Pain or a burning feeling.  Blisters.  Blood or clear fluid draining from skin cracks. With allergic contact dermatitis, swelling may occur. This may happen in places such as the eyelids, mouth, or genitals. How is this treated?  This condition is treated by checking for the cause of the reaction and protecting your skin. Treatment may also include: ? Steroid creams, ointments, or medicines. ? Antibiotic medicines or other ointments, if you have a skin infection. ? Lotion or medicines to help with itching. ? A bandage (dressing). Follow  these instructions at home: Skin care  Moisturize your skin as needed.  Put cool cloths on your skin.  Put a baking soda paste on your skin. Stir water into baking soda until it looks like a paste.  Do not scratch your skin.  Avoid having things rub up against your skin.  Avoid the use of soaps, perfumes, and dyes. Medicines  Take or apply over-the-counter and prescription medicines only as told by your doctor.  If you were prescribed an antibiotic medicine, take or apply it as told by your doctor. Do not stop using it even if your condition starts to get better. Bathing  Take a bath with: ? Epsom salts. ? Baking soda. ? Colloidal oatmeal.  Bathe less often.  Bathe in warm water. Avoid using hot water. Bandage care  If you were given a bandage, change it as told by your health care provider.  Wash your hands with soap and water before and after you change your bandage. If soap and water are not available, use hand sanitizer. General instructions  Avoid the things that caused your reaction. If you do not know what caused it, keep a journal. Write down: ? What you eat. ? What skin products you use. ? What you drink. ? What you wear in the area that has symptoms. This includes jewelry.  Check the affected areas every day for signs of infection. Check for: ? More redness, swelling, or pain. ? More fluid or blood. ? Warmth. ? Pus or a bad smell.  Keep all follow-up visits as told by your doctor. This is important.  Contact a doctor if:  You do not get better with treatment.  Your condition gets worse.  You have signs of infection, such as: ? More swelling. ? Tenderness. ? More redness. ? Soreness. ? Warmth.  You have a fever.  You have new symptoms. Get help right away if:  You have a very bad headache.  You have neck pain.  Your neck is stiff.  You throw up (vomit).  You feel very sleepy.  You see red streaks coming from the area.  Your bone  or joint near the area hurts after the skin has healed.  The area turns darker.  You have trouble breathing. Summary  Dermatitis is redness, soreness, and swelling of the skin.  Symptoms may occur where the irritant has touched you.  Treatment may include medicines and skin care.  If you do not know what caused your reaction, keep a journal.  Contact a doctor if your condition gets worse or you have signs of infection. This information is not intended to replace advice given to you by your health care provider. Make sure you discuss any questions you have with your health care provider. Document Released: 05/14/2009 Document Revised: 11/06/2018 Document Reviewed: 01/30/2018 Elsevier Patient Education  2020 [employer].

## 2019-04-24 NOTE — Progress Notes (Signed)
[name] is a 63 y.o. female who presents to [facility] today for rash on right breast. On Monday, patient placed her silicone breast prostheses directly against the skin of her right breast instead of inside her bra as she usually does. She woke up Tuesday with splotchy rash which then began oozing with clear liquid. The next day she felt like she had the flu. She had COVID test same-day which came back negative. She denied fever, cough, fatigue, chills, headache. She reports she normally has a runny nose due to allergies. The rash is localized to the right breast, itchy, with no pain or warmth. She used dove unscented soap and hydrocortisone to help with the itch but the rash did not resolve. She has not used any new medications, and uses her normal "all natural, chemical-free" products.    ROS as above: No fever, chills, joint pain.   Exam:  BP 111/71   Pulse 76   Wt 178 lb (80.7 kg)   BMI 30.55 kg/m  Wt Readings from Last 5 Encounters:  04/24/19 178 lb (80.7 kg)  02/07/19 176 lb (79.8 kg)  01/27/19 179 lb (81.2 kg)  11/12/18 179 lb (81.2 kg)  10/10/18 177 lb 8 oz (80.5 kg)    Gen: Well NAD HEENT: EOMI,  MMM Lungs: Normal work of breathing. CTABL Heart: RRR no MRG Abd: Nondistended Exts: Brisk capillary refill, warm and well perfused.  Skin: Erythematous maculopapular rash with some scale on right chest wall.  Mastectomy scar normal-appearing with no nodules or discharge.        Assessment and Plan: 63 y.o. female with a rash on her right breast. Likely contact dermatitis. Will start triamcinolone cream. Prescribed doxycycline and advised patient to start antibiotics if rash does not get better in the next 2-3 days.  Recheck if not improving.  I spent 25 minutes with this patient, greater than 50% was face-to-face time counseling regarding  differential diagnosis treatment plan and options.Marland Kitchen  PDMP not reviewed this encounter. No orders of the defined types were placed in this encounter.  Meds ordered this encounter  Medications  . triamcinolone cream (KENALOG) 0.5 %    Sig: Apply 1 application topically 2 (two) times daily. To affected areas.    Dispense:  454 g    Refill:  3  . doxycycline (VIBRA-TABS) 100 MG tablet    Sig: Take 1 tablet (100 mg total) by mouth 2 (two) times daily.    Dispense:  14 tablet    Refill:  0     Historical information moved to improve visibility of documentation.  Past Medical History:  Diagnosis Date  . Anxiety   . Asthma   . Breast cancer ([place])   . Chest pain, atypical   . Closed head injury 06/06/2017  . Depression   . Heart palpitations    hx of MVP  . Hypertension   . IBS (irritable bowel syndrome)   . Pre-diabetes    Past Surgical History:  Procedure Laterality Date  . BREAST SURGERY  06/2018   right and left breast  . eAB     x2   . MASTECTOMY MODIFIED RADICAL Bilateral 10/10/2018   Procedure: RIGHT MODIFIED RADICAL MASTECTOMY AND LEFT PROPHYLATIC MASTECTOMY;  Surgeon: [name], MD;  Location: [facility];  Service: General;  Laterality: Bilateral;  . PORTACATH PLACEMENT Left 08/12/2018   Procedure: INSERTION PORT-A-CATH;  Surgeon: [name], MD;  Location: [facility];  Service: General;  Laterality: Left;   Social History   Tobacco Use  . Smoking status: Never Smoker  . Smokeless tobacco: Never Used  . Tobacco comment: nonsmoker  Substance Use Topics  . Alcohol use: No   family history includes Bladder Cancer in her brother; Heart attack in her father; Lung cancer in her mother.  Medications: Current Outpatient Medications  Medication Sig Dispense Refill  . albuterol (PROAIR HFA) 108 (90 Base) MCG/ACT inhaler Inhale 2 puffs into the lungs every 6 (six) hours as needed for wheezing. 1 Inhaler 6  . aspirin EC 81 MG tablet Take 81 mg by mouth  4 (four) times a week.    . B Complex-C (B-COMPLEX WITH VITAMIN C) tablet Take 1 tablet by mouth daily after lunch.    . budesonide-formoterol (SYMBICORT) 160-4.5 MCG/ACT inhaler Inhale 2 puffs into the lungs 2 (two) times daily. 3 Inhaler 3  . Calcium Ascorbate (BUFFERED C PO) Take 1 tablet by mouth daily after lunch. Buffered C-Complex    . Calcium-Magnesium (CAL-MAG PO) Take 1 tablet by mouth 2 (two) times daily.    . CHLOROPHYLL PO Take 1 tablet by mouth every other day.    . citalopram (CELEXA) 10 MG tablet Take 1 tablet (10 mg total) by mouth daily. 90 tablet 1  . Garlic Oil 123XX123 MG CAPS Take 1 capsule by mouth daily.     Marland Kitchen HYDROcodone-acetaminophen (NORCO/VICODIN) 5-325 MG tablet Take 1-2 tablets by mouth every 6 (six) hours as needed for moderate pain or severe pain. 10 tablet 0  . Lactobacillus (ACIDOPHILUS PO) Take 1 capsule by mouth daily.    Marland Kitchen letrozole (FEMARA) 2.5 MG tablet Take 2.5 mg by mouth daily.     Marland Kitchen lidocaine-prilocaine (EMLA) cream Apply to affected area once 30 g 3  . lisinopril (ZESTRIL) 10 MG tablet Take 1 tablet (10 mg total) by mouth every other day. 90 tablet 1  . loratadine (CLARITIN) 10 MG tablet Take 1 tablet (10 mg total) by mouth daily. (Patient taking differently: Take 10 mg by mouth daily as needed for allergies. ) 90 tablet 3  . LORazepam (ATIVAN) 0.5 MG tablet Take 1 tablet (0.5 mg total) by mouth at bedtime as needed (Nausea or vomiting). 30 tablet 0  . Misc Natural Products (TURMERIC CURCUMIN) CAPS Take 1 capsule by mouth daily after lunch.    . mupirocin ointment (BACTROBAN) 2 % Apply to wound 3 times daily for 5 days 30 g 0  . olopatadine (PATANOL) 0.1 % ophthalmic solution Place 1 drop into both eyes 2 (two) times daily as needed for allergies. 5 mL 12  . Omega-3 Fatty Acids (FISH OIL) 1000 MG CAPS Take 1 capsule by mouth daily. Ultimate Omega    . ondansetron (ZOFRAN) 8 MG tablet Take 1 tablet (8 mg total) by mouth 2 (two) times daily as needed. Start on  the third day after chemotherapy. 30 tablet 1  . Polyethyl Glycol-Propyl Glycol (SYSTANE) 0.4-0.3 % SOLN Place 1 drop into both eyes 3 (three) times daily as needed (dry/irritated eyes.).    Marland Kitchen prochlorperazine (COMPAZINE) 10 MG tablet Take 1 tablet (10 mg total) by mouth every 6 (six) hours as needed (Nausea or vomiting). 30 tablet 1  . triamcinolone cream (KENALOG) 0.5 % Apply 1 application topically 2 (two) times daily. To affected areas. 454 g 3  . Vitamin D-Vitamin K (D3 + K2 DOTS PO) Take 1 tablet by mouth daily.  No current facility-administered medications for this visit.    Allergies  Allergen Reactions  . Penicillins Shortness Of Breath    Reaction as a child Did it involve swelling of the face/tongue/throat, SOB, or low BP? Yes Did it involve sudden or severe rash/hives, skin peeling, or any reaction on the inside of your mouth or nose?Unknown Did you need to seek medical attention at a hospital or doctor's office? Yes When did it last happen? Childhood reaction. If all above answers are "NO", may proceed with cephalosporin use.   . Sulfa Antibiotics Nausea Only  . Effexor [Venlafaxine] Other (See Comments)    "dopey"     Discussed warning signs or symptoms. Please see discharge instructions. Patient expresses understanding.   I personally was present and performed or re-performed the history, physical exam and medical decision-making activities of this service and have verified that the service and findings are accurately documented in the student's note. ___________________________________________ Lynne Leader M.D., ABFM., CAQSM. Primary Care and Sports Medicine Adjunct Instructor of [employer]

## 2019-04-29 DIAGNOSIS — M50321 Other cervical disc degeneration at C4-C5 level: Secondary | ICD-10-CM | POA: Diagnosis not present

## 2019-04-29 DIAGNOSIS — M9903 Segmental and somatic dysfunction of lumbar region: Secondary | ICD-10-CM | POA: Diagnosis not present

## 2019-04-29 DIAGNOSIS — C50911 Malignant neoplasm of unspecified site of right female breast: Secondary | ICD-10-CM | POA: Diagnosis not present

## 2019-04-29 DIAGNOSIS — M9901 Segmental and somatic dysfunction of cervical region: Secondary | ICD-10-CM | POA: Diagnosis not present

## 2019-04-29 DIAGNOSIS — M9902 Segmental and somatic dysfunction of thoracic region: Secondary | ICD-10-CM | POA: Diagnosis not present

## 2019-05-01 ENCOUNTER — Encounter: Payer: Self-pay | Admitting: Family Medicine

## 2019-05-14 ENCOUNTER — Encounter: Payer: Self-pay | Admitting: Family Medicine

## 2019-05-15 MED ORDER — LISINOPRIL 10 MG PO TABS: 10.0000 mg | ORAL_TABLET | Freq: Every day | ORAL | 3 refills | Status: DC

## 2019-05-28 DIAGNOSIS — M9902 Segmental and somatic dysfunction of thoracic region: Secondary | ICD-10-CM | POA: Diagnosis not present

## 2019-05-28 DIAGNOSIS — M9901 Segmental and somatic dysfunction of cervical region: Secondary | ICD-10-CM | POA: Diagnosis not present

## 2019-05-28 DIAGNOSIS — M9903 Segmental and somatic dysfunction of lumbar region: Secondary | ICD-10-CM | POA: Diagnosis not present

## 2019-05-28 DIAGNOSIS — M50321 Other cervical disc degeneration at C4-C5 level: Secondary | ICD-10-CM | POA: Diagnosis not present

## 2019-05-29 ENCOUNTER — Encounter: Payer: Self-pay | Admitting: Family Medicine

## 2019-05-31 ENCOUNTER — Encounter: Payer: Self-pay | Admitting: Family Medicine

## 2019-06-16 ENCOUNTER — Encounter: Payer: Self-pay | Admitting: Family Medicine

## 2019-07-01 ENCOUNTER — Encounter: Payer: Self-pay | Admitting: Family Medicine

## 2019-07-02 MED ORDER — LISINOPRIL 10 MG PO TABS: 10.0000 mg | ORAL_TABLET | Freq: Every day | ORAL | 0 refills | Status: DC

## 2019-07-02 MED ORDER — BUDESONIDE-FORMOTEROL FUMARATE 160-4.5 MCG/ACT IN AERO: 2.0000 | INHALATION_SPRAY | Freq: Two times a day (BID) | RESPIRATORY_TRACT | 0 refills | Status: DC

## 2019-07-02 MED ORDER — CITALOPRAM HYDROBROMIDE 10 MG PO TABS: 10.0000 mg | ORAL_TABLET | Freq: Every day | ORAL | 0 refills | Status: DC

## 2019-07-02 NOTE — Telephone Encounter (Signed)
Happy to refill these, letrozole however will need to be refilled by her oncologist.

## 2019-07-08 ENCOUNTER — Encounter: Payer: Self-pay | Admitting: *Deleted

## 2019-07-16 ENCOUNTER — Encounter: Payer: Self-pay | Admitting: Family Medicine

## 2019-07-18 ENCOUNTER — Encounter: Payer: Self-pay | Admitting: Family Medicine

## 2019-07-21 ENCOUNTER — Other Ambulatory Visit: Payer: BC Managed Care – PPO

## 2019-07-22 ENCOUNTER — Ambulatory Visit: Payer: BC Managed Care – PPO | Admitting: Sports Medicine

## 2019-07-28 ENCOUNTER — Ambulatory Visit: Payer: BC Managed Care – PPO | Admitting: Sports Medicine

## 2019-07-29 ENCOUNTER — Encounter: Payer: Self-pay | Admitting: Family Medicine

## 2019-07-29 DIAGNOSIS — J4520 Mild intermittent asthma, uncomplicated: Secondary | ICD-10-CM

## 2019-07-30 ENCOUNTER — Other Ambulatory Visit: Payer: Self-pay

## 2019-07-30 ENCOUNTER — Encounter: Payer: Self-pay | Admitting: Medical-Surgical

## 2019-07-30 ENCOUNTER — Ambulatory Visit (INDEPENDENT_AMBULATORY_CARE_PROVIDER_SITE_OTHER): Payer: BC Managed Care – PPO

## 2019-07-30 ENCOUNTER — Ambulatory Visit: Payer: BC Managed Care – PPO | Admitting: Sports Medicine

## 2019-07-30 ENCOUNTER — Ambulatory Visit: Payer: BC Managed Care – PPO | Admitting: Medical-Surgical

## 2019-07-30 VITALS — BP 126/67 | HR 71 | Temp 98.2°F | Ht 64.0 in | Wt 183.1 lb

## 2019-07-30 DIAGNOSIS — I10 Essential (primary) hypertension: Secondary | ICD-10-CM

## 2019-07-30 DIAGNOSIS — F411 Generalized anxiety disorder: Secondary | ICD-10-CM | POA: Diagnosis not present

## 2019-07-30 DIAGNOSIS — R918 Other nonspecific abnormal finding of lung field: Secondary | ICD-10-CM | POA: Diagnosis not present

## 2019-07-30 DIAGNOSIS — R7303 Prediabetes: Secondary | ICD-10-CM | POA: Diagnosis not present

## 2019-07-30 DIAGNOSIS — R0789 Other chest pain: Secondary | ICD-10-CM | POA: Diagnosis not present

## 2019-07-30 DIAGNOSIS — R079 Chest pain, unspecified: Secondary | ICD-10-CM

## 2019-07-30 MED ORDER — CITALOPRAM HYDROBROMIDE 20 MG PO TABS: 20.0000 mg | ORAL_TABLET | Freq: Every day | ORAL | 0 refills | Status: DC

## 2019-07-30 NOTE — Assessment & Plan Note (Signed)
Increase citalopram to 20 mg daily.

## 2019-07-30 NOTE — Assessment & Plan Note (Addendum)
Last hemoglobin A1c checked 10 months ago indicated prediabetes.  Recheck today.

## 2019-07-30 NOTE — Assessment & Plan Note (Signed)
Blood pressure in office within normal limits.  Continue checking blood pressure at home.  Encourage lifestyle changes to include increased exercise and return to healthy diet.  No change in lisinopril dosing at this time.

## 2019-07-30 NOTE — Assessment & Plan Note (Addendum)
EKG in office.   Chest x-ray.   Labs: D-dimer, TSH, HgbA1c, and lipids.   Wells criteria score of 1 indicates PE is unlikely at this time but will draw D-dimer today due to cancer treatment within the last 6 months.   If chest x-ray and labs normal, consider musculoskeletal versus GAD exacerbation.

## 2019-07-30 NOTE — Progress Notes (Signed)
Subjective:    CC: Chest discomfort  HPI:  Pleasant 63 year old female presenting with complaints of midline, achy chest discomfort occurring intermittently over the past 3 weeks.  Last episode reported was last night which was accompanied by fatigue and flushing/"hot flashes" (on Femara per oncology).  Reports pain is dull and feels internal, not related to bilateral mastectomy surgery site.  Denies shortness of breath, diaphoresis, edema, and palpitations. Chest discomfort not associated with any activity or exercise.  Denies reflux/heartburn, nausea, and abdominal pain.  During episode last night, patient took an aspirin and reports symptoms resolved within 60 seconds. Has been lifting boxes/more active recently.  HTN: Checks blood pressure at home regularly and has noticed an increase with the variation in diet over the holidays and reduction of exercise.  Highest reported blood pressure at home was 150/74.  Takes lisinopril daily.  Reports an occasional mild dry cough that she associates with taking lisinopril but is happy with therapy and does not desire change at this time.  GAD: Currently managed with citalopram 10 mg daily.  Reports increased stress for the past couple of months with worsening over the holidays.  With increased stress does report a left frontal headache.  Cites source of stress as worries over health and family issues with her son.  Also reports having approximately 3 "meltdowns" over the last month that involves "throwing things, screaming at the dog, and getting in the car and driving really fast".  Has discussed increasing citalopram with Dr. Georgina Snell in the past but was worried about increased dose affecting her alertness.  I reviewed the past medical history, family history, social history, surgical history, and allergies today and no changes were needed.  Please see the problem list section below in epic for further details.  Past Medical History: Past Medical History:   Diagnosis Date  . Anxiety   . Asthma   . Breast cancer ([place])   . Chest pain, atypical   . Closed head injury 06/06/2017  . Depression   . Heart palpitations    hx of MVP  . Hypertension   . IBS (irritable bowel syndrome)   . Pre-diabetes    Past Surgical History: Past Surgical History:  Procedure Laterality Date  . BREAST SURGERY  06/2018   right and left breast  . eAB     x2   . MASTECTOMY MODIFIED RADICAL Bilateral 10/10/2018   Procedure: RIGHT MODIFIED RADICAL MASTECTOMY AND LEFT PROPHYLATIC MASTECTOMY;  Surgeon: [name], MD;  Location: [facility];  Service: General;  Laterality: Bilateral;  . PORTACATH PLACEMENT Left 08/12/2018   Procedure: INSERTION PORT-A-CATH;  Surgeon: [name], MD;  Location: [facility];  Service: General;  Laterality: Left;   Social History: Social History   Socioeconomic History  . Marital status: Divorced    Spouse name: Not on file  . Number of children: Not on file  . Years of education: Not on file  . Highest education level: Not on file  Occupational History  . Not on file  Tobacco Use  . Smoking status: Never Smoker  . Smokeless tobacco: Never Used  . Tobacco comment: nonsmoker  Substance and Sexual Activity  . Alcohol use: No  . Drug use: No  . Sexual activity: Not Currently    Birth control/protection: None  Other Topics Concern  . Not on file  Social History Narrative   Divorced; grown son lives in [place].    [facility]- PCP   Social Determinants  of Health   Financial Resource Strain:   . Difficulty of Paying Living Expenses: Not on file  Food Insecurity:   . Worried About Charity fundraiser in the Last Year: Not on file  . Ran Out of Food in the Last Year: Not on file  Transportation Needs:   . Lack of Transportation (Medical): Not on file  . Lack of Transportation (Non-Medical): Not on file  Physical Activity:   . Days of Exercise per Week: Not on file  . Minutes of Exercise per  Session: Not on file  Stress:   . Feeling of Stress : Not on file  Social Connections:   . Frequency of Communication with Friends and Family: Not on file  . Frequency of Social Gatherings with Friends and Family: Not on file  . Attends Religious Services: Not on file  . Active Member of Clubs or Organizations: Not on file  . Attends Archivist Meetings: Not on file  . Marital Status: Not on file   Family History: Family History  Problem Relation Age of Onset  . Heart attack Father   . Lung cancer Mother   . Bladder Cancer Brother    Allergies: Allergies  Allergen Reactions  . Penicillins Shortness Of Breath    Reaction as a child Did it involve swelling of the face/tongue/throat, SOB, or low BP? Yes Did it involve sudden or severe rash/hives, skin peeling, or any reaction on the inside of your mouth or nose?Unknown Did you need to seek medical attention at a hospital or doctor's office? Yes When did it last happen? Childhood reaction. If all above answers are "NO", may proceed with cephalosporin use.   . Sulfa Antibiotics Nausea Only  . Effexor [Venlafaxine] Other (See Comments)    "dopey"   Medications: See med rec.  Review of Systems: No fevers, chills, night sweats, weight loss, or shortness of breath.   Objective:    General: Well Developed, well nourished, and in no acute distress.  Neuro: Alert and oriented x3.  HEENT: Normocephalic, atraumatic, pupils equal round reactive to light, neck supple, no masses, no lymphadenopathy, thyroid nonpalpable.  Skin: Warm and dry.  Radiation burn present to lower right side of neck and upper chest.  Skin in this area is intact, reddened. Cardiac: Regular rate and rhythm, no murmurs rubs or gallops, no lower extremity edema.  Respiratory: Clear to auscultation bilaterally. Not using accessory muscles, speaking in full sentences. Abdomen: Soft, nontender, nondistended.  Bowel sounds positive x4 quadrants.  Labs  obtained 2 weeks ago at [facility] available for review include CBC, CMP, and glucose, no areas of concern identified there.  EKG: Rate 64, normal sinus rhythm.  No acute changes from prior EKG.  DG Chest 2 View  Result Date: 07/30/2019 CLINICAL DATA:  Chest pain EXAM: CHEST - 2 VIEW COMPARISON:  08/14/2018 FINDINGS: Previously seen left chest wall port has been removed. Postsurgical changes in the right axilla are noted. Cardiac shadow is within normal limits. The lungs are clear with the exception of some patchy airspace opacity in the right upper lobe. No bony abnormality is noted. IMPRESSION: Patchy airspace opacity in the right upper lobe. Electronically Signed   By: Inez Catalina M.D.   On: 07/30/2019 14:07   CXR images personally reviewed       Impression and Recommendations:    Essential hypertension Blood pressure in office within normal limits.  Continue checking blood pressure at home.  Encourage lifestyle changes to include increased  exercise and return to healthy diet.  No change in lisinopril dosing at this time.   Prediabetes Last hemoglobin A1c checked 10 months ago indicated prediabetes.  Recheck today.  GAD (generalized anxiety disorder) Increase citalopram to 20 mg daily.    Chest discomfort EKG in office.   Chest x-ray.   Labs: D-dimer, TSH, HgbA1c, and lipids.   Wells criteria score of 1 indicates PE is unlikely at this time but will draw D-dimer today due to cancer treatment within the last 6 months.   If chest x-ray and labs normal, consider musculoskeletal versus GAD exacerbation.  Return in about 4 weeks (around 08/27/2019) for Chest discomfort/GAD.  ___________________________________________ Clearnce Sorrel, DNP, APRN, FNP-BC Primary [facility] and [place]

## 2019-07-31 ENCOUNTER — Other Ambulatory Visit: Payer: Self-pay

## 2019-07-31 ENCOUNTER — Telehealth: Payer: Self-pay | Admitting: Medical-Surgical

## 2019-07-31 ENCOUNTER — Telehealth: Payer: Self-pay

## 2019-07-31 ENCOUNTER — Encounter: Payer: Self-pay | Admitting: Family Medicine

## 2019-07-31 ENCOUNTER — Ambulatory Visit (HOSPITAL_BASED_OUTPATIENT_CLINIC_OR_DEPARTMENT_OTHER)
Admission: RE | Admit: 2019-07-31 | Discharge: 2019-07-31 | Disposition: A | Discharge status: Home / Self Care | Payer: BC Managed Care – PPO | Source: Ambulatory Visit | Attending: Medical-Surgical | Admitting: Medical-Surgical

## 2019-07-31 ENCOUNTER — Encounter (HOSPITAL_BASED_OUTPATIENT_CLINIC_OR_DEPARTMENT_OTHER): Payer: Self-pay

## 2019-07-31 DIAGNOSIS — R0789 Other chest pain: Secondary | ICD-10-CM | POA: Insufficient documentation

## 2019-07-31 DIAGNOSIS — C50919 Malignant neoplasm of unspecified site of unspecified female breast: Secondary | ICD-10-CM | POA: Diagnosis not present

## 2019-07-31 DIAGNOSIS — R7989 Other specified abnormal findings of blood chemistry: Secondary | ICD-10-CM | POA: Diagnosis not present

## 2019-07-31 LAB — LIPID PANEL
Cholesterol: 209 mg/dL — ABNORMAL HIGH (ref <200)
HDL: 57 mg/dL (ref >=50)
LDL Cholesterol (Calc): 112 mg/dL (calc) — ABNORMAL HIGH
Non-HDL Cholesterol (Calc): 152 mg/dL (calc) — ABNORMAL HIGH (ref <130)
Total CHOL/HDL Ratio: 3.7 (calc) (ref <5.0)
Triglycerides: 277 mg/dL — ABNORMAL HIGH (ref <150)

## 2019-07-31 LAB — HEMOGLOBIN A1C
Hgb A1c MFr Bld: 6.3 % of total Hgb — ABNORMAL HIGH (ref <5.7)
Mean Plasma Glucose: 134 (calc)
eAG (mmol/L): 7.4 (calc)

## 2019-07-31 LAB — D-DIMER, QUANTITATIVE: D-Dimer, Quant: 0.65 mcg/mL FEU — ABNORMAL HIGH (ref <0.50)

## 2019-07-31 LAB — TSH: TSH: 1.54 mIU/L (ref 0.40–4.50)

## 2019-07-31 MED ORDER — IOHEXOL 350 MG/ML SOLN
100.0000 mL | Freq: Once | INTRAVENOUS | Status: AC | PRN
  Administered 2019-07-31: 14:00:00 100 mL via INTRAVENOUS

## 2019-07-31 NOTE — Addendum Note (Signed)
Addended bySamuel Bouche on: 07/31/2019 10:20 AM   Modules accepted: Orders

## 2019-07-31 NOTE — Telephone Encounter (Signed)
FYI:  Called pt to let her know that a STAT CT Angio Chest PE was being ordered due to her elevated D-Dimer and that Apolonio Schneiders was working on the prior auth for this scan now. Pt states she did not believe it was necessary. Pt states she spoke with her Oncologist, Dr. Georgiann Cocker, regarding the CXR results and was told "the chest pain is due to your hx of radiation and you do not need to have a repeat CXR done." She said that the d-dimer results were not that elevated so she does not feel it is necessary. I told her that any elevation is a risk to her health. She said that she wanted Joy to do a telephone consult with Dr. Georgiann Cocker and that "if Dr. Georgiann Cocker says I need to have it done then I will do it. I do not feel that Dr. Georgina Snell would have been so liberal with ordering tests for me. I already owe money to [facility] and do not want to have to pay out more money. But if Dr. Georgiann Cocker says I need to have it done, then I will do it." I repeated her statement to her for clarification and she verified her statement. After ending the call, I shared this info with Caryl Asp and Apolonio Schneiders. The pt then called me back wanting to know why [facility] was calling her and I told her that they were calling to schedule the CT and that between our telephone conversations a secure message was sent to Dr. Georgina Snell and he responded instructing [place] to tell her to go get the CT done. I informed the pt of what Dr. Georgina Snell said and she stated "Since Dr. Georgina Snell says I need to have it done, then I will do it." She stated she would call [facility] back and schedule the scan and according to her appointment desk she is scheduled to have it done this afternoon at 1:30 PM.

## 2019-07-31 NOTE — Telephone Encounter (Signed)
Lab results will be addressed in results. See lab results.

## 2019-07-31 NOTE — Telephone Encounter (Signed)
[name] called in this morning stating that she got her results for cholesterol. She asked if it could've been high from the holiday eating? Should she be rechecked and when.

## 2019-08-05 ENCOUNTER — Ambulatory Visit: Payer: BC Managed Care – PPO | Attending: Internal Medicine

## 2019-08-05 DIAGNOSIS — Z20822 Contact with and (suspected) exposure to covid-19: Secondary | ICD-10-CM | POA: Diagnosis not present

## 2019-08-05 MED ORDER — ALBUTEROL SULFATE HFA 108 (90 BASE) MCG/ACT IN AERS: 2.0000 | INHALATION_SPRAY | Freq: Four times a day (QID) | RESPIRATORY_TRACT | 0 refills | Status: DC | PRN

## 2019-08-06 LAB — NOVEL CORONAVIRUS, NAA: SARS-CoV-2, NAA: NOT DETECTED

## 2019-08-07 ENCOUNTER — Other Ambulatory Visit: Payer: BC Managed Care – PPO

## 2019-08-18 ENCOUNTER — Encounter: Payer: Self-pay | Admitting: Family Medicine

## 2019-08-27 ENCOUNTER — Encounter: Payer: Self-pay | Admitting: Family Medicine

## 2019-08-28 ENCOUNTER — Ambulatory Visit: Payer: BC Managed Care – PPO | Admitting: Nurse Practitioner

## 2019-08-29 ENCOUNTER — Encounter: Payer: Self-pay | Admitting: Sports Medicine

## 2019-08-29 ENCOUNTER — Ambulatory Visit: Payer: BC Managed Care – PPO | Admitting: Sports Medicine

## 2019-08-29 ENCOUNTER — Other Ambulatory Visit: Payer: Self-pay

## 2019-08-29 DIAGNOSIS — L2082 Flexural eczema: Secondary | ICD-10-CM

## 2019-08-29 NOTE — Assessment & Plan Note (Addendum)
[name] is here with a 1 and half week history of increasing rash on the flexural surfaces of both elbows, as well as some on her low back. She recalls trying to eat more of a pescatarian diet, and is worried that she may have an allergy. She denies any nausea, vomiting, diarrhea. She is endorsing feeling somewhat fatigued, and a mild headache. No fevers, chills or upper respiratory symptoms. Over the past week and a half she has then developed an erythematous, macular rash on the flexural surface of her right and left elbows, as well as a small amount of papular rash on her left low back. She has been picking at these lesions creating an element of neurodermatitis as well. She will take triamcinolone twice daily, moisturize her skin daily. She declined a burst of prednisone. I would like to see her back as needed in 2 weeks. Another better option would be for her to see Dr. Zigmund Daniel for follow-up.

## 2019-08-29 NOTE — Patient Instructions (Signed)

## 2019-08-29 NOTE — Progress Notes (Signed)
    Procedures performed today:    None.  Independent interpretation of tests performed by another provider:   None.  Impression and Recommendations:    Eczematous dermatitis [name] is here with a 1 and half week history of increasing rash on the flexural surfaces of both elbows, as well as some on [name] low back. She recalls trying to eat more of a pescatarian diet, and is worried that she may have an allergy. She denies any nausea, vomiting, diarrhea. She is endorsing feeling somewhat fatigued, and a mild headache. No fevers, chills or upper respiratory symptoms. Over the past week and a half she has then developed an erythematous, macular rash on the flexural surface of [name] right and left elbows, as well as a small amount of papular rash on [name] left low back. She has been picking at these lesions creating an element of neurodermatitis as well. She will take triamcinolone twice daily, moisturize [name] skin daily. She declined a burst of prednisone. I would like to see [name] back as needed in 2 weeks. Another better option would be for [name] to see Dr. Zigmund Daniel for follow-up.    ___________________________________________ [name]. [name], M.D., ABFM., CAQSM. Primary Care and [place] Instructor of [place] [employer] [facility]

## 2019-09-10 NOTE — Progress Notes (Deleted)
CARDIOLOGY CONSULT NOTE       Patient ID: [name] MRN: [id number] DOB/AGE: [birth year] 64 y.o.  Admit date: (Not on file) Referring Physician: Ellard Artis Primary Physician: Gregor Hams, MD Primary Cardiologist: New Reason for Consultation: Chest Pain  Active Problems:   * No active hospital problems. *   HPI:  64 y.o. history of HTN, GAD, Asthma previous closed head injury with depression Complained to primary on 12/202/20 with atypical chest pain. Midline, aching for 3 weeks Non exertional accompanied by fatigue hot flashes. Not related to her previous bilateral mastectomies Aspirin made pain go away ECG and labs were non acute Patient last seen by cardiology over 3 years ago for atypical chest pain and palpitations She had a normal stress echo 09/11/16  TTE done 08/16/18 normal EF 60-65% no valve disease   ***  ROS All other systems reviewed and negative except as noted above  Past Medical History:  Diagnosis Date  . Anxiety   . Asthma   . Breast cancer ([place])   . Chest pain, atypical   . Closed head injury 06/06/2017  . Depression   . Heart palpitations    hx of MVP  . Hypertension   . IBS (irritable bowel syndrome)   . Pre-diabetes     Family History  Problem Relation Age of Onset  . Heart attack Father   . Lung cancer Mother   . Bladder Cancer Brother     Social History   Socioeconomic History  . Marital status: Divorced    Spouse name: Not on file  . Number of children: Not on file  . Years of education: Not on file  . Highest education level: Not on file  Occupational History  . Not on file  Tobacco Use  . Smoking status: Never Smoker  . Smokeless tobacco: Never Used  . Tobacco comment: nonsmoker  Substance and Sexual Activity  . Alcohol use: No  . Drug use: No  . Sexual activity: Not Currently    Birth control/protection: None  Other Topics Concern  . Not on file  Social History Narrative   Divorced; grown son lives in [place].    [facility]- PCP   Social Determinants of Health   Financial Resource Strain:   . Difficulty of Paying Living Expenses: Not on file  Food Insecurity:   . Worried About Charity fundraiser in the Last Year: Not on file  . Ran Out of Food in the Last Year: Not on file  Transportation Needs:   . Lack of Transportation (Medical): Not on file  . Lack of Transportation (Non-Medical): Not on file  Physical Activity:   . Days of Exercise per Week: Not on file  . Minutes of Exercise per Session: Not on file  Stress:   . Feeling of Stress : Not on file  Social Connections:   . Frequency of Communication with Friends and Family: Not on file  . Frequency of Social Gatherings with Friends and Family: Not on file  . Attends Religious Services: Not on file  . Active Member of Clubs or Organizations: Not on file  . Attends Archivist Meetings: Not on file  . Marital Status: Not on file  Intimate Partner Violence:   . Fear of Current or Ex-Partner: Not on file  . Emotionally Abused: Not on file  . Physically Abused: Not on file  . Sexually Abused: Not on file    Past Surgical History:  Procedure Laterality  Date  . BREAST SURGERY  06/2018   right and left breast  . eAB     x2   . MASTECTOMY MODIFIED RADICAL Bilateral 10/10/2018   Procedure: RIGHT MODIFIED RADICAL MASTECTOMY AND LEFT PROPHYLATIC MASTECTOMY;  Surgeon: [name], MD;  Location: [facility];  Service: General;  Laterality: Bilateral;  . PORTACATH PLACEMENT Left 08/12/2018   Procedure: INSERTION PORT-A-CATH;  Surgeon: [name], MD;  Location: [place];  Service: General;  Laterality: Left;      Current Outpatient Medications:  .  albuterol (VENTOLIN HFA) 108 (90 Base) MCG/ACT inhaler, Inhale 2 puffs into the lungs every 6 (six) hours as needed for wheezing or shortness of breath., Disp: 6.7 g, Rfl: 0 .  aspirin EC 81 MG tablet, Take 81 mg by mouth 4 (four) times a week., Disp: , Rfl:  .  B Complex-C  (B-COMPLEX WITH VITAMIN C) tablet, Take 1 tablet by mouth daily after lunch., Disp: , Rfl:  .  budesonide-formoterol (SYMBICORT) 160-4.5 MCG/ACT inhaler, Inhale 2 puffs into the lungs 2 (two) times daily., Disp: 3 Inhaler, Rfl: 0 .  Calcium Ascorbate (BUFFERED C PO), Take 1 tablet by mouth daily after lunch. Buffered C-Complex, Disp: , Rfl:  .  Calcium-Magnesium (CAL-MAG PO), Take 1 tablet by mouth 2 (two) times daily., Disp: , Rfl:  .  CHLOROPHYLL PO, Take 1 tablet by mouth every other day., Disp: , Rfl:  .  citalopram (CELEXA) 20 MG tablet, Take 1 tablet (20 mg total) by mouth daily., Disp: 90 tablet, Rfl: 0 .  Garlic Oil 123XX123 MG CAPS, Take 1 capsule by mouth daily. , Disp: , Rfl:  .  Lactobacillus (ACIDOPHILUS PO), Take 1 capsule by mouth daily., Disp: , Rfl:  .  lisinopril (ZESTRIL) 10 MG tablet, Take 1 tablet (10 mg total) by mouth daily., Disp: 90 tablet, Rfl: 0 .  loratadine (CLARITIN) 10 MG tablet, Take 1 tablet (10 mg total) by mouth daily. (Patient taking differently: Take 10 mg by mouth daily as needed for allergies. ), Disp: 90 tablet, Rfl: 3 .  LORazepam (ATIVAN) 0.5 MG tablet, Take 1 tablet (0.5 mg total) by mouth at bedtime as needed (Nausea or vomiting). (Patient not taking: Reported on 07/30/2019), Disp: 30 tablet, Rfl: 0 .  Misc Natural Products (TURMERIC CURCUMIN) CAPS, Take 1 capsule by mouth daily after lunch., Disp: , Rfl:  .  mupirocin ointment (BACTROBAN) 2 %, Apply to wound 3 times daily for 5 days, Disp: 30 g, Rfl: 0 .  Omega-3 Fatty Acids (FISH OIL) 1000 MG CAPS, Take 1 capsule by mouth daily. Ultimate Omega, Disp: , Rfl:  .  Polyethyl Glycol-Propyl Glycol (SYSTANE) 0.4-0.3 % SOLN, Place 1 drop into both eyes 3 (three) times daily as needed (dry/irritated eyes.)., Disp: , Rfl:  .  triamcinolone cream (KENALOG) 0.5 %, Apply 1 application topically 2 (two) times daily. To affected areas., Disp: 454 g, Rfl: 3 .  Vitamin D-Vitamin K (D3 + K2 DOTS PO), Take 1 tablet by mouth  daily., Disp: , Rfl:     Physical Exam: There were no vitals taken for this visit.    Affect appropriate Healthy:  appears stated age 21: normal Neck supple with no adenopathy JVP normal no bruits no thyromegaly Lungs clear with no wheezing and good diaphragmatic motion Heart:  S1/S2 no murmur, no rub, gallop or click PMI normal Abdomen: benighn, BS positve, no tenderness, no AAA no bruit.  No HSM or HJR Distal pulses intact with  no bruits No edema Neuro non-focal Skin warm and dry No muscular weakness   Labs:   Lab Results  Component Value Date   WBC 6.0 10/03/2018   HGB 12.4 10/03/2018   HCT 39.8 10/03/2018   MCV 93.6 10/03/2018   PLT 213 10/03/2018   No results for input(s): NA, K, CL, CO2, BUN, CREATININE, CALCIUM, PROT, BILITOT, ALKPHOS, ALT, AST, GLUCOSE in the last 168 hours.  Invalid input(s): LABALBU Lab Results  Component Value Date   [id number] (H) 04/11/2018    Lab Results  Component Value Date   CHOL 209 (H) 07/30/2019   CHOL 199 10/10/2017   CHOL 216 (H) 08/29/2016   Lab Results  Component Value Date   HDL 57 07/30/2019   HDL 53 10/10/2017   HDL 54 08/29/2016   Lab Results  Component Value Date   LDLCALC 112 (H) 07/30/2019   LDLCALC 120 (H) 10/10/2017   LDLCALC 120 (H) 08/29/2016   Lab Results  Component Value Date   TRIG 277 (H) 07/30/2019   TRIG 146 10/10/2017   TRIG 212 (H) 08/29/2016   Lab Results  Component Value Date   CHOLHDL 3.7 07/30/2019   CHOLHDL 3.8 10/10/2017   CHOLHDL 4.0 08/29/2016   No results found for: LDLDIRECT    Radiology: No results found.  EKG: 08/22/19 SR rate 68 normal    ASSESSMENT AND PLAN:   1. Chest Pain:  Atypical normal ECG previously normal stress echo 2017 *** 2.  HTN:  Labile worse with anxiety f/u primary continue ACE add back beta blocker as needed 3. Asthma:  No active wheezing PRN Ventolin HFA inhaler  4. GAD:  Chronic relates to a lot of her somatic symptoms continue Celexa  Signed: Jenkins Rouge 09/10/2019, 8:50 AM

## 2019-09-15 ENCOUNTER — Encounter: Payer: Self-pay | Admitting: Family Medicine

## 2019-09-15 ENCOUNTER — Ambulatory Visit (INDEPENDENT_AMBULATORY_CARE_PROVIDER_SITE_OTHER): Payer: BC Managed Care – PPO | Admitting: Family Medicine

## 2019-09-15 ENCOUNTER — Other Ambulatory Visit: Payer: Self-pay

## 2019-09-15 ENCOUNTER — Telehealth: Payer: Self-pay | Admitting: Cardiovascular Disease

## 2019-09-15 VITALS — BP 134/70 | HR 86 | Ht 64.5 in | Wt 188.0 lb

## 2019-09-15 DIAGNOSIS — L989 Disorder of the skin and subcutaneous tissue, unspecified: Secondary | ICD-10-CM

## 2019-09-15 DIAGNOSIS — L2082 Flexural eczema: Secondary | ICD-10-CM

## 2019-09-15 NOTE — Telephone Encounter (Signed)
Left message stating I would forward message to Dr. Kyla Balzarine RN for review.

## 2019-09-15 NOTE — Progress Notes (Signed)
[name] - 64 y.o. female MRN [id number]  Date of birth: [birth year]  Subjective Chief Complaint  Patient presents with  . Skin Problem  . Colon Cancer Screening    Declined colonoscopy    HPI [name] is a 64 y.o. female initially scheduled for annual exam but prefer to address skin lesion and f/u recent rash.  She was seen for atopic dermatitis on 08/29/19 by Dr. Darene Lamer and started on triamcinolone cream.  Rash has cleared up.   She has a few areas on scalp that she is concerned about.  These areas are raised.  She denies pain or drainage.  Hair is thin due to hair loss from  previous breast cancer treatment.  It bothers her how areas look and she usually wears a hat due to this.  She would like to have these removed.   ROS:  A comprehensive ROS was completed and negative except as noted per HPI  Allergies  Allergen Reactions  . Penicillins Shortness Of Breath    Reaction as a child Did it involve swelling of the face/tongue/throat, SOB, or low BP? Yes Did it involve sudden or severe rash/hives, skin peeling, or any reaction on the inside of your mouth or nose?Unknown Did you need to seek medical attention at a hospital or doctor's office? Yes When did it last happen? Childhood reaction. If all above answers are "NO", may proceed with cephalosporin use.   . Sulfa Antibiotics Nausea Only  . Effexor [Venlafaxine] Other (See Comments)    "dopey"    Past Medical History:  Diagnosis Date  . Anxiety   . Asthma   . Breast cancer ([place])   . Chest pain, atypical   . Closed head injury 06/06/2017  . Depression   . Heart palpitations    hx of MVP  . Hypertension   . IBS (irritable bowel syndrome)   . Pre-diabetes     Past Surgical History:  Procedure Laterality Date  . BREAST SURGERY  06/2018   right and left breast  . eAB     x2   . MASTECTOMY MODIFIED RADICAL Bilateral 10/10/2018   Procedure: RIGHT MODIFIED RADICAL MASTECTOMY AND LEFT PROPHYLATIC MASTECTOMY;  Surgeon:  [name], MD;  Location: [facility];  Service: General;  Laterality: Bilateral;  . PORTACATH PLACEMENT Left 08/12/2018   Procedure: INSERTION PORT-A-CATH;  Surgeon: [name], MD;  Location: [place];  Service: General;  Laterality: Left;    Social History   Socioeconomic History  . Marital status: Divorced    Spouse name: Not on file  . Number of children: Not on file  . Years of education: Not on file  . Highest education level: Not on file  Occupational History  . Not on file  Tobacco Use  . Smoking status: Never Smoker  . Smokeless tobacco: Never Used  . Tobacco comment: nonsmoker  Substance and Sexual Activity  . Alcohol use: No  . Drug use: No  . Sexual activity: Not Currently    Birth control/protection: None  Other Topics Concern  . Not on file  Social History Narrative   Divorced; grown son lives in [place].    [facility]- PCP   Social Determinants of Health   Financial Resource Strain:   . Difficulty of Paying Living Expenses: Not on file  Food Insecurity:   . Worried About Charity fundraiser in the Last Year: Not on file  . Ran Out of Food in  the Last Year: Not on file  Transportation Needs:   . Lack of Transportation (Medical): Not on file  . Lack of Transportation (Non-Medical): Not on file  Physical Activity:   . Days of Exercise per Week: Not on file  . Minutes of Exercise per Session: Not on file  Stress:   . Feeling of Stress : Not on file  Social Connections:   . Frequency of Communication with Friends and Family: Not on file  . Frequency of Social Gatherings with Friends and Family: Not on file  . Attends Religious Services: Not on file  . Active Member of Clubs or Organizations: Not on file  . Attends Archivist Meetings: Not on file  . Marital Status: Not on file    Family History  Problem Relation Age of Onset  . Heart attack Father   . Lung cancer Mother   . Bladder Cancer Brother     Health  Maintenance  Topic Date Due  . COLONOSCOPY  11/22/2004  . INFLUENZA VACCINE  10/29/2019 (Originally 03/01/2019)  . MAMMOGRAM  07/11/2020  . PAP SMEAR-Modifier  01/26/2022  . TETANUS/TDAP  03/28/2028  . Hepatitis C Screening  Completed  . HIV Screening  Completed    ----------------------------------------------------------------------------------------------------------------------------------------------------------------------------------------------------------------- Physical Exam BP (!) 153/61   Pulse 86   Ht 5' 4.5" (1.638 m)   Wt 188 lb (85.3 kg)   SpO2 98%   BMI 31.77 kg/m   Physical Exam Constitutional:      Appearance: Normal appearance.  Cardiovascular:     Rate and Rhythm: Normal rate and regular rhythm.  Pulmonary:     Effort: Pulmonary effort is normal.     Breath sounds: Normal breath sounds.  Skin:    General: Skin is warm and dry.     Comments: ~1cm slightly raised lesion to mid, anterior scalp without redness or tenderness.  This does have rough texture.   Adjacent ~1cm, tan pigmented, slightly raised, waxy appearing lesion to left anterior scalp.  Non tender and without erythema.   Neurological:     General: No focal deficit present.     Mental Status: She is alert.  Psychiatric:        Mood and Affect: Mood normal.        Behavior: Behavior normal.     ------------------------------------------------------------------------------------------------------------------------------------------------------------------------------------------------------------------- Assessment and Plan  Skin lesion of scalp Areas on scalp with appearance of seborrheic keratosis.  Discussed benign nature of these lesions.  She does prefer to have these removed for cosmetic resons and requests referral to [facility] dermatology to have this completed.  Referral entered per patient request.    Eczematous dermatitis Rash resolved with triamcinolone cream. Continue as needed.    30 minutes spent including pre visit preparation, review of prior notes and labs, encounter with patient, counseling, coordination of care and same day documentation.   This visit occurred during the SARS-CoV-2 public health emergency.  Safety protocols were in place, including screening questions prior to the visit, additional usage of staff PPE, and extensive cleaning of exam room while observing appropriate contact time as indicated for disinfecting solutions.

## 2019-09-15 NOTE — Patient Instructions (Signed)
Very nice to meet you today! You should hear from dermatology to schedule and appointment.  See me again in a few months for your annual exam.

## 2019-09-15 NOTE — Telephone Encounter (Signed)
Patient calling stating she was seen by other doctors and had a normal EKG. She says, because of this she does not feel her appointment was necessary and canceled it. She states if Dr. Johnsie Cancel would like her to get any tests done to let her know.

## 2019-09-15 NOTE — Assessment & Plan Note (Signed)
Rash resolved with triamcinolone cream. Continue as needed.

## 2019-09-15 NOTE — Assessment & Plan Note (Signed)
Areas on scalp with appearance of seborrheic keratosis.  Discussed benign nature of these lesions.  She does prefer to have these removed for cosmetic resons and requests referral to [facility] dermatology to have this completed.  Referral entered per patient request.

## 2019-09-16 NOTE — Telephone Encounter (Signed)
Patient was seen in 2017 by Dr. Johnsie Cancel, and she was to follow up as needed. Will forward to Dr. Johnsie Cancel to see if patient needs any follow up, since EKG is normal.

## 2019-09-16 NOTE — Telephone Encounter (Signed)
Yes her ECG from January looks normal If she is not having symptoms can see her as needed

## 2019-09-16 NOTE — Telephone Encounter (Signed)
Left detailed message on patient's voicemail of Dr. Kyla Balzarine recommendations, per [facility].

## 2019-09-18 ENCOUNTER — Ambulatory Visit: Payer: BC Managed Care – PPO | Admitting: Cardiovascular Disease

## 2019-09-25 ENCOUNTER — Encounter: Payer: Self-pay | Admitting: Family Medicine

## 2019-09-26 NOTE — Telephone Encounter (Signed)
I don't think we have samples of symbicort available.  Can we get a sample of breo to get her until 4/1?

## 2019-10-14 ENCOUNTER — Encounter: Payer: Self-pay | Admitting: Family Medicine

## 2019-10-14 DIAGNOSIS — M79673 Pain in unspecified foot: Secondary | ICD-10-CM

## 2019-10-14 NOTE — Telephone Encounter (Signed)
Referral pended

## 2019-10-25 ENCOUNTER — Encounter: Payer: Self-pay | Admitting: Family Medicine

## 2019-10-26 ENCOUNTER — Encounter: Payer: Self-pay | Admitting: Family Medicine

## 2019-10-30 ENCOUNTER — Ambulatory Visit: Payer: BC Managed Care – PPO | Admitting: Family Medicine

## 2019-11-04 DIAGNOSIS — H524 Presbyopia: Secondary | ICD-10-CM | POA: Diagnosis not present

## 2019-11-04 DIAGNOSIS — H52223 Regular astigmatism, bilateral: Secondary | ICD-10-CM | POA: Diagnosis not present

## 2019-11-04 DIAGNOSIS — H5211 Myopia, right eye: Secondary | ICD-10-CM | POA: Diagnosis not present

## 2019-11-04 DIAGNOSIS — E119 Type 2 diabetes mellitus without complications: Secondary | ICD-10-CM | POA: Diagnosis not present

## 2019-11-04 LAB — HM DIABETES EYE EXAM

## 2019-11-06 NOTE — Telephone Encounter (Signed)
Forwarding to covering provider.

## 2019-11-12 ENCOUNTER — Encounter: Payer: Self-pay | Admitting: Family Medicine

## 2019-11-17 ENCOUNTER — Ambulatory Visit: Payer: BC Managed Care – PPO | Admitting: Family Medicine

## 2019-11-18 ENCOUNTER — Encounter: Payer: Self-pay | Admitting: Family Medicine

## 2019-11-20 ENCOUNTER — Other Ambulatory Visit: Payer: Self-pay | Admitting: Family Medicine

## 2019-11-20 MED ORDER — LISINOPRIL 10 MG PO TABS: 10.0000 mg | ORAL_TABLET | Freq: Two times a day (BID) | ORAL | 1 refills | Status: DC

## 2019-11-21 ENCOUNTER — Ambulatory Visit (INDEPENDENT_AMBULATORY_CARE_PROVIDER_SITE_OTHER): Payer: Medicare Other

## 2019-11-21 ENCOUNTER — Ambulatory Visit: Payer: Medicare Other | Admitting: Podiatry

## 2019-11-21 ENCOUNTER — Encounter: Payer: Self-pay | Admitting: Podiatry

## 2019-11-21 ENCOUNTER — Other Ambulatory Visit: Payer: Self-pay

## 2019-11-21 VITALS — BP 136/68 | HR 89 | Temp 97.0°F | Resp 16

## 2019-11-21 DIAGNOSIS — M79671 Pain in right foot: Secondary | ICD-10-CM

## 2019-11-21 DIAGNOSIS — Q828 Other specified congenital malformations of skin: Secondary | ICD-10-CM | POA: Diagnosis not present

## 2019-11-21 DIAGNOSIS — M898X9 Other specified disorders of bone, unspecified site: Secondary | ICD-10-CM

## 2019-11-21 DIAGNOSIS — M79672 Pain in left foot: Secondary | ICD-10-CM

## 2019-11-21 NOTE — Progress Notes (Signed)
   Subjective:    Patient ID: [name], female    DOB: January 04, 1956, 64 y.o.   MRN: [id number]  HPI    Review of Systems  All other systems reviewed and are negative.      Objective:   Physical Exam        Assessment & Plan:

## 2019-11-21 NOTE — Progress Notes (Signed)
Subjective:   Patient ID: [name], female   DOB: 64 y.o.   MRN: [id number]   HPI  64 year old female presents to the office today for concerns of a skin lesion on the bottom of her left foot which is painful but also for bone spurs on top of her feet the left side worse than the right.  She is here for occasion become painful with pressure in shoes.  She denies any recent injury she said no recent treatment.  This is been on the last year for the skin lesion in the last several years the top of her foot.    Review of Systems  All other systems reviewed and are negative.  Past Medical History:  Diagnosis Date  . Anxiety   . Asthma   . Breast cancer ([place])   . Chest pain, atypical   . Closed head injury 06/06/2017  . Depression   . Heart palpitations    hx of MVP  . Hypertension   . IBS (irritable bowel syndrome)   . Pre-diabetes     Past Surgical History:  Procedure Laterality Date  . BREAST SURGERY  06/2018   right and left breast  . eAB     x2   . MASTECTOMY MODIFIED RADICAL Bilateral 10/10/2018   Procedure: RIGHT MODIFIED RADICAL MASTECTOMY AND LEFT PROPHYLATIC MASTECTOMY;  Surgeon: [name], MD;  Location: [facility];  Service: General;  Laterality: Bilateral;  . PORTACATH PLACEMENT Left 08/12/2018   Procedure: INSERTION PORT-A-CATH;  Surgeon: [name], MD;  Location: [place];  Service: General;  Laterality: Left;     Current Outpatient Medications:  .  albuterol (VENTOLIN HFA) 108 (90 Base) MCG/ACT inhaler, Inhale 2 puffs into the lungs every 6 (six) hours as needed for wheezing or shortness of breath., Disp: 6.7 g, Rfl: 0 .  aspirin EC 81 MG tablet, Take 81 mg by mouth 4 (four) times a week., Disp: , Rfl:  .  B Complex-C (B-COMPLEX WITH VITAMIN C) tablet, Take 1 tablet by mouth daily after lunch., Disp: , Rfl:  .  Calcium Ascorbate (BUFFERED C PO), Take 1 tablet by mouth daily after lunch. Buffered C-Complex, Disp: , Rfl:  .   Calcium-Magnesium (CAL-MAG PO), Take 1 tablet by mouth 2 (two) times daily., Disp: , Rfl:  .  CHLOROPHYLL PO, Take 1 tablet by mouth every other day., Disp: , Rfl:  .  citalopram (CELEXA) 20 MG tablet, Take 1 tablet (20 mg total) by mouth daily., Disp: 90 tablet, Rfl: 0 .  Garlic Oil 123XX123 MG CAPS, Take 1 capsule by mouth daily. , Disp: , Rfl:  .  Lactobacillus (ACIDOPHILUS PO), Take 1 capsule by mouth daily., Disp: , Rfl:  .  lisinopril (ZESTRIL) 10 MG tablet, Take 1 tablet (10 mg total) by mouth in the morning and at bedtime., Disp: 180 tablet, Rfl: 1 .  loratadine (CLARITIN) 10 MG tablet, Take 1 tablet (10 mg total) by mouth daily. (Patient taking differently: Take 10 mg by mouth daily as needed for allergies. ), Disp: 90 tablet, Rfl: 3 .  Misc Natural Products (TURMERIC CURCUMIN) CAPS, Take 1 capsule by mouth daily after lunch., Disp: , Rfl:  .  mupirocin ointment (BACTROBAN) 2 %, Apply to wound 3 times daily for 5 days, Disp: 30 g, Rfl: 0 .  Omega-3 Fatty Acids (FISH OIL) 1000 MG CAPS, Take 1 capsule by mouth daily. Ultimate Omega, Disp: , Rfl:  .  triamcinolone cream (KENALOG) 0.5 %,  Apply 1 application topically 2 (two) times daily. To affected areas., Disp: 454 g, Rfl: 3 .  Vitamin D-Vitamin K (D3 + K2 DOTS PO), Take 1 tablet by mouth daily., Disp: , Rfl:  .  budesonide-formoterol (SYMBICORT) 160-4.5 MCG/ACT inhaler, Inhale 2 puffs into the lungs 2 (two) times daily., Disp: 3 Inhaler, Rfl: 0  Allergies  Allergen Reactions  . Penicillins Shortness Of Breath    Reaction as a child Did it involve swelling of the face/tongue/throat, SOB, or low BP? Yes Did it involve sudden or severe rash/hives, skin peeling, or any reaction on the inside of your mouth or nose?Unknown Did you need to seek medical attention at a hospital or doctor's office? Yes When did it last happen? Childhood reaction. If all above answers are "NO", may proceed with cephalosporin use.   . Sulfa Antibiotics Nausea Only   . Effexor [Venlafaxine] Other (See Comments)    "dopey"          Objective:  Physical Exam  General: AAO x3, NAD  Dermatological: Small punctate annular hyperkeratotic lesion left foot submetatarsal 4.  There is no ongoing ulceration, drainage or foreign body.  Vascular: Dorsalis Pedis artery and Posterior Tibial artery pedal pulses are 2/4 bilateral with immedate capillary fill time. There is no pain with calf compression, swelling, warmth, erythema.   Neruologic: Grossly intact via light touch bilateral.  Sensation intact with Semmes Weinstein monofilament  Musculoskeletal: Dorsal spurring presents with medial aspect of the midfoot on the first metatarsal cuneiform joint.  There is no tenderness palpation of the area but does cause irritation inside shoes.  Prominence of metatarsal heads plantarly with atrophy of the fat pad  Gait: Unassisted, Nonantalgic.       Assessment:   Dorsal exostosis with intermittent discomfort L>>R; porokeratosis left foot      Plan:  -Treatment options discussed including all alternatives, risks, and complications -Etiology of symptoms were discussed -X-rays ordered -I released her shoes to avoid the pressure to the bone spurs.  Offloading discussed. -Debrided hyperkeratotic lesions without any complication or bleeding left foot.  Moisturizer daily.  Dispensed metatarsal offloading pads.  RTC prn  Trula Slade DPM

## 2019-11-24 ENCOUNTER — Ambulatory Visit (INDEPENDENT_AMBULATORY_CARE_PROVIDER_SITE_OTHER): Payer: Medicare Other | Admitting: Family Medicine

## 2019-11-24 ENCOUNTER — Other Ambulatory Visit: Payer: Self-pay

## 2019-11-24 ENCOUNTER — Encounter: Payer: Self-pay | Admitting: Family Medicine

## 2019-11-24 DIAGNOSIS — I10 Essential (primary) hypertension: Secondary | ICD-10-CM

## 2019-11-24 NOTE — Assessment & Plan Note (Signed)
BP elevated today.  She will increase lisinopril to 20mg  and f/u in 2 weeks to recheck.  Reduce sodium intake, given info on DASH diet.  Had some mild blurred vision for a few seconds this morning.  I don't think this was related to her BP. She will let me know if this re-occurs.

## 2019-11-24 NOTE — Progress Notes (Signed)
[name] - 64 y.o. female MRN [id number]  Date of birth: [birth year]  Subjective Chief Complaint  Patient presents with  . Hypertension    HPI SIEARA [name] is a 64 y.o. female here today due to complaint of elevated blood pressure.  BP currently managed with lisinopril.  Recently instructed to increase lisinopril to 20mg  due to elevated BP at home, however she has not started new dose.  She admits to increased salt intake, had an entire bag of cheetos over the weekend.  She also was a little stressed and upset that some of her family didn't call her for her birthday yesterday.  She denies chest pain, shortness of breath, palpitations, headache associated with her HTN.  She did have a few seconds of blurred vision this morning but denies vision loss and this has fully resolved at this point.   ROS:  A comprehensive ROS was completed and negative except as noted per HPI  Allergies  Allergen Reactions  . Penicillins Shortness Of Breath    Reaction as a child Did it involve swelling of the face/tongue/throat, SOB, or low BP? Yes Did it involve sudden or severe rash/hives, skin peeling, or any reaction on the inside of your mouth or nose?Unknown Did you need to seek medical attention at a hospital or doctor's office? Yes When did it last happen? Childhood reaction. If all above answers are "NO", may proceed with cephalosporin use.   . Sulfa Antibiotics Nausea Only  . Effexor [Venlafaxine] Other (See Comments)    "dopey"    Past Medical History:  Diagnosis Date  . Anxiety   . Asthma   . Breast cancer ([place])   . Chest pain, atypical   . Closed head injury 06/06/2017  . Depression   . Heart palpitations    hx of MVP  . Hypertension   . IBS (irritable bowel syndrome)   . Pre-diabetes     Past Surgical History:  Procedure Laterality Date  . BREAST SURGERY  06/2018   right and left breast  . eAB     x2   . MASTECTOMY MODIFIED RADICAL Bilateral 10/10/2018   Procedure: RIGHT  MODIFIED RADICAL MASTECTOMY AND LEFT PROPHYLATIC MASTECTOMY;  Surgeon: [name], MD;  Location: [facility];  Service: General;  Laterality: Bilateral;  . PORTACATH PLACEMENT Left 08/12/2018   Procedure: INSERTION PORT-A-CATH;  Surgeon: [name], MD;  Location: [place];  Service: General;  Laterality: Left;    Social History   Socioeconomic History  . Marital status: Divorced    Spouse name: Not on file  . Number of children: Not on file  . Years of education: Not on file  . Highest education level: Not on file  Occupational History  . Not on file  Tobacco Use  . Smoking status: Never Smoker  . Smokeless tobacco: Never Used  . Tobacco comment: nonsmoker  Substance and Sexual Activity  . Alcohol use: No  . Drug use: No  . Sexual activity: Not Currently    Birth control/protection: None  Other Topics Concern  . Not on file  Social History Narrative   Divorced; grown son lives in [place].    [facility]- PCP   Social Determinants of Health   Financial Resource Strain:   . Difficulty of Paying Living Expenses:   Food Insecurity:   . Worried About Charity fundraiser in the Last Year:   . [place] in the Last Year:  Transportation Needs:   . Film/video editor (Medical):   Marland Kitchen Lack of Transportation (Non-Medical):   Physical Activity:   . Days of Exercise per Week:   . Minutes of Exercise per Session:   Stress:   . Feeling of Stress :   Social Connections:   . Frequency of Communication with Friends and Family:   . Frequency of Social Gatherings with Friends and Family:   . Attends Religious Services:   . Active Member of Clubs or Organizations:   . Attends Archivist Meetings:   Marland Kitchen Marital Status:     Family History  Problem Relation Age of Onset  . Heart attack Father   . Lung cancer Mother   . Bladder Cancer Brother     Health Maintenance  Topic Date Due  . COLONOSCOPY  Never done  . DEXA SCAN  11/23/2020  (Originally 11/23/2019)  . PNA vac Low Risk Adult (1 of 2 - PCV13) 11/23/2020 (Originally 11/23/2019)  . INFLUENZA VACCINE  02/29/2020  . MAMMOGRAM  07/11/2020  . PAP SMEAR-Modifier  01/26/2022  . TETANUS/TDAP  03/28/2028  . Hepatitis C Screening  Completed  . HIV Screening  Completed  . COVID-19 Vaccine  Discontinued     ----------------------------------------------------------------------------------------------------------------------------------------------------------------------------------------------------------------- Physical Exam BP (!) 155/79   Pulse 76   Temp 98.8 F (37.1 C) (Oral)   Ht 5' 4.5" (1.638 m)   Wt 185 lb (83.9 kg)   BMI 31.26 kg/m   Physical Exam Constitutional:      Appearance: Normal appearance.  HENT:     Head: Normocephalic and atraumatic.  Eyes:     General: No scleral icterus.    Extraocular Movements: Extraocular movements intact.     Pupils: Pupils are equal, round, and reactive to light.  Cardiovascular:     Rate and Rhythm: Normal rate and regular rhythm.  Pulmonary:     Effort: Pulmonary effort is normal.     Breath sounds: Normal breath sounds.  Neurological:     General: No focal deficit present.     Mental Status: She is alert and oriented to person, place, and time.     Cranial Nerves: No cranial nerve deficit.     Motor: No weakness.     Coordination: Coordination normal.     Gait: Gait normal.  Psychiatric:        Mood and Affect: Mood normal.        Behavior: Behavior normal.     ------------------------------------------------------------------------------------------------------------------------------------------------------------------------------------------------------------------- Assessment and Plan  Essential hypertension BP elevated today.  She will increase lisinopril to 20mg  and f/u in 2 weeks to recheck.  Reduce sodium intake, given info on DASH diet.  Had some mild blurred vision for a few seconds this  morning.  I don't think this was related to her BP. She will let me know if this re-occurs.     No orders of the defined types were placed in this encounter.   No follow-ups on file.    This visit occurred during the SARS-CoV-2 public health emergency.  Safety protocols were in place, including screening questions prior to the visit, additional usage of staff PPE, and extensive cleaning of exam room while observing appropriate contact time as indicated for disinfecting solutions.

## 2019-11-24 NOTE — Patient Instructions (Signed)
DASH Eating Plan DASH stands for "Dietary Approaches to Stop Hypertension." The DASH eating plan is a healthy eating plan that has been shown to reduce high blood pressure (hypertension). It may also reduce your risk for type 2 diabetes, heart disease, and stroke. The DASH eating plan may also help with weight loss. What are tips for following this plan?  General guidelines  Avoid eating more than 2,300 mg (milligrams) of salt (sodium) a day. If you have hypertension, you may need to reduce your sodium intake to 1,500 mg a day.  Limit alcohol intake to no more than 1 drink a day for nonpregnant women and 2 drinks a day for men. One drink equals 12 oz of beer, 5 oz of wine, or 1 oz of hard liquor.  Work with your health care provider to maintain a healthy body weight or to lose weight. Ask what an ideal weight is for you.  Get at least 30 minutes of exercise that causes your heart to beat faster (aerobic exercise) most days of the week. Activities may include walking, swimming, or biking.  Work with your health care provider or diet and nutrition specialist (dietitian) to adjust your eating plan to your individual calorie needs. Reading food labels   Check food labels for the amount of sodium per serving. Choose foods with less than 5 percent of the Daily Value of sodium. Generally, foods with less than 300 mg of sodium per serving fit into this eating plan.  To find whole grains, look for the word "whole" as the first word in the ingredient list. Shopping  Buy products labeled as "low-sodium" or "no salt added."  Buy fresh foods. Avoid canned foods and premade or frozen meals. Cooking  Avoid adding salt when cooking. Use salt-free seasonings or herbs instead of table salt or sea salt. Check with your health care provider or pharmacist before using salt substitutes.  Do not fry foods. Cook foods using healthy methods such as baking, boiling, grilling, and broiling instead.  Cook with  heart-healthy oils, such as olive, canola, soybean, or sunflower oil. Meal planning  Eat a balanced diet that includes: ? 5 or more servings of fruits and vegetables each day. At each meal, try to fill half of your plate with fruits and vegetables. ? Up to 6-8 servings of whole grains each day. ? Less than 6 oz of lean meat, poultry, or fish each day. A 3-oz serving of meat is about the same size as a deck of cards. One egg equals 1 oz. ? 2 servings of low-fat dairy each day. ? A serving of nuts, seeds, or beans 5 times each week. ? Heart-healthy fats. Healthy fats called Omega-3 fatty acids are found in foods such as flaxseeds and coldwater fish, like sardines, salmon, and mackerel.  Limit how much you eat of the following: ? Canned or prepackaged foods. ? Food that is high in trans fat, such as fried foods. ? Food that is high in saturated fat, such as fatty meat. ? Sweets, desserts, sugary drinks, and other foods with added sugar. ? Full-fat dairy products.  Do not salt foods before eating.  Try to eat at least 2 vegetarian meals each week.  Eat more home-cooked food and less restaurant, buffet, and fast food.  When eating at a restaurant, ask that your food be prepared with less salt or no salt, if possible. What foods are recommended? The items listed may not be a complete list. Talk with your dietitian about   what dietary choices are best for you. Grains Whole-grain or whole-wheat bread. Whole-grain or whole-wheat pasta. Brown rice. Oatmeal. Quinoa. Bulgur. Whole-grain and low-sodium cereals. Pita bread. Low-fat, low-sodium crackers. Whole-wheat flour tortillas. Vegetables Fresh or frozen vegetables (raw, steamed, roasted, or grilled). Low-sodium or reduced-sodium tomato and vegetable juice. Low-sodium or reduced-sodium tomato sauce and tomato paste. Low-sodium or reduced-sodium canned vegetables. Fruits All fresh, dried, or frozen fruit. Canned fruit in natural juice (without  added sugar). Meat and other protein foods Skinless chicken or turkey. Ground chicken or turkey. Pork with fat trimmed off. Fish and seafood. Egg whites. Dried beans, peas, or lentils. Unsalted nuts, nut butters, and seeds. Unsalted canned beans. Lean cuts of beef with fat trimmed off. Low-sodium, lean deli meat. Dairy Low-fat (1%) or fat-free (skim) milk. Fat-free, low-fat, or reduced-fat cheeses. Nonfat, low-sodium ricotta or cottage cheese. Low-fat or nonfat yogurt. Low-fat, low-sodium cheese. Fats and oils Soft margarine without trans fats. Vegetable oil. Low-fat, reduced-fat, or light mayonnaise and salad dressings (reduced-sodium). Canola, safflower, olive, soybean, and sunflower oils. Avocado. Seasoning and other foods Herbs. Spices. Seasoning mixes without salt. Unsalted popcorn and pretzels. Fat-free sweets. What foods are not recommended? The items listed may not be a complete list. Talk with your dietitian about what dietary choices are best for you. Grains Baked goods made with fat, such as croissants, muffins, or some breads. Dry pasta or rice meal packs. Vegetables Creamed or fried vegetables. Vegetables in a cheese sauce. Regular canned vegetables (not low-sodium or reduced-sodium). Regular canned tomato sauce and paste (not low-sodium or reduced-sodium). Regular tomato and vegetable juice (not low-sodium or reduced-sodium). Pickles. Olives. Fruits Canned fruit in a light or heavy syrup. Fried fruit. Fruit in cream or butter sauce. Meat and other protein foods Fatty cuts of meat. Ribs. Fried meat. Bacon. Sausage. Bologna and other processed lunch meats. Salami. Fatback. Hotdogs. Bratwurst. Salted nuts and seeds. Canned beans with added salt. Canned or smoked fish. Whole eggs or egg yolks. Chicken or turkey with skin. Dairy Whole or 2% milk, cream, and half-and-half. Whole or full-fat cream cheese. Whole-fat or sweetened yogurt. Full-fat cheese. Nondairy creamers. Whipped toppings.  Processed cheese and cheese spreads. Fats and oils Butter. Stick margarine. Lard. Shortening. Ghee. Bacon fat. Tropical oils, such as coconut, palm kernel, or palm oil. Seasoning and other foods Salted popcorn and pretzels. Onion salt, garlic salt, seasoned salt, table salt, and sea salt. Worcestershire sauce. Tartar sauce. Barbecue sauce. Teriyaki sauce. Soy sauce, including reduced-sodium. Steak sauce. Canned and packaged gravies. Fish sauce. Oyster sauce. Cocktail sauce. Horseradish that you find on the shelf. Ketchup. Mustard. Meat flavorings and tenderizers. Bouillon cubes. Hot sauce and Tabasco sauce. Premade or packaged marinades. Premade or packaged taco seasonings. Relishes. Regular salad dressings. Where to find more information:  National Heart, Lung, and Blood Institute: www.nhlbi.nih.gov  American Heart Association: www.heart.org Summary  The DASH eating plan is a healthy eating plan that has been shown to reduce high blood pressure (hypertension). It may also reduce your risk for type 2 diabetes, heart disease, and stroke.  With the DASH eating plan, you should limit salt (sodium) intake to 2,300 mg a day. If you have hypertension, you may need to reduce your sodium intake to 1,500 mg a day.  When on the DASH eating plan, aim to eat more fresh fruits and vegetables, whole grains, lean proteins, low-fat dairy, and heart-healthy fats.  Work with your health care provider or diet and nutrition specialist (dietitian) to adjust your eating plan to your   individual calorie needs. This information is not intended to replace advice given to you by your health care provider. Make sure you discuss any questions you have with your health care provider. Document Revised: 06/29/2017 Document Reviewed: 07/10/2016 Elsevier Patient Education  2020 Elsevier Inc.  

## 2019-11-26 ENCOUNTER — Encounter: Payer: Self-pay | Admitting: Podiatry

## 2019-11-27 ENCOUNTER — Encounter: Payer: Self-pay | Admitting: Family Medicine

## 2019-11-27 ENCOUNTER — Ambulatory Visit: Payer: Medicare Other | Admitting: Sports Medicine

## 2019-11-28 ENCOUNTER — Ambulatory Visit: Payer: Medicare Other | Admitting: Sports Medicine

## 2019-12-03 ENCOUNTER — Encounter: Payer: Self-pay | Admitting: Family Medicine

## 2019-12-10 ENCOUNTER — Ambulatory Visit: Payer: Medicare Other

## 2019-12-21 ENCOUNTER — Encounter: Payer: Self-pay | Admitting: Family Medicine

## 2020-01-01 ENCOUNTER — Encounter: Payer: Self-pay | Admitting: Family Medicine

## 2020-01-01 ENCOUNTER — Ambulatory Visit (INDEPENDENT_AMBULATORY_CARE_PROVIDER_SITE_OTHER): Payer: Medicare Other | Admitting: Family Medicine

## 2020-01-01 VITALS — BP 122/70 | HR 81 | Temp 97.9°F | Wt 188.0 lb

## 2020-01-01 DIAGNOSIS — F411 Generalized anxiety disorder: Secondary | ICD-10-CM | POA: Diagnosis not present

## 2020-01-01 DIAGNOSIS — R7303 Prediabetes: Secondary | ICD-10-CM | POA: Diagnosis not present

## 2020-01-01 DIAGNOSIS — I10 Essential (primary) hypertension: Secondary | ICD-10-CM

## 2020-01-01 DIAGNOSIS — R42 Dizziness and giddiness: Secondary | ICD-10-CM | POA: Diagnosis not present

## 2020-01-01 LAB — COMPLETE METABOLIC PANEL WITH GFR
AG Ratio: 1.6 (calc) (ref 1.0–2.5)
ALT: 11 U/L (ref 6–29)
AST: 24 U/L (ref 10–35)
Albumin: 4.1 g/dL (ref 3.6–5.1)
Alkaline phosphatase (APISO): 131 U/L (ref 37–153)
BUN: 18 mg/dL (ref 7–25)
CO2: 28 mmol/L (ref 20–32)
Calcium: 9.9 mg/dL (ref 8.6–10.4)
Chloride: 105 mmol/L (ref 98–110)
Creat: 0.84 mg/dL (ref 0.50–0.99)
GFR, Est African American: 85 mL/min/{1.73_m2} (ref >=60)
GFR, Est Non African American: 73 mL/min/{1.73_m2} (ref >=60)
Globulin: 2.6 g/dL (calc) (ref 1.9–3.7)
Glucose, Bld: 130 mg/dL — ABNORMAL HIGH (ref 65–99)
Potassium: 5 mmol/L (ref 3.5–5.3)
Sodium: 140 mmol/L (ref 135–146)
Total Bilirubin: 0.3 mg/dL (ref 0.2–1.2)
Total Protein: 6.7 g/dL (ref 6.1–8.1)

## 2020-01-01 LAB — CBC
HCT: 39.5 % (ref 35.0–45.0)
Hemoglobin: 13.4 g/dL (ref 11.7–15.5)
MCH: 29.6 pg (ref 27.0–33.0)
MCHC: 33.9 g/dL (ref 32.0–36.0)
MCV: 87.2 fL (ref 80.0–100.0)
MPV: 9.5 fL (ref 7.5–12.5)
Platelets: 265 10*3/uL (ref 140–400)
RBC: 4.53 10*6/uL (ref 3.80–5.10)
RDW: 13.1 % (ref 11.0–15.0)
WBC: 4.1 10*3/uL (ref 3.8–10.8)

## 2020-01-01 LAB — TSH: TSH: 1.99 mIU/L (ref 0.40–4.50)

## 2020-01-01 NOTE — Assessment & Plan Note (Signed)
Orthostatics without significant drop in BP or HR change.  Possibly related to anxiety or hyperglycemia from increased intake of sugary foods.  Orders Placed This Encounter  Procedures  . COMPLETE METABOLIC PANEL WITH GFR  . CBC  . TSH  . HgB A1c

## 2020-01-01 NOTE — Assessment & Plan Note (Signed)
-  Current symptoms seem to be anxiety related.  -She will continue citalopram with addition of bupropion as this may help some with her disordered eating as well.

## 2020-01-01 NOTE — Patient Instructions (Signed)
Please have labs completed Bupropion (wellbutrin) may be helpful for stress and anxiety.  Sometimes this can help with weight loss as well.  Let me know if you want to try this.  If symptoms continue to re-occur let me know.

## 2020-01-01 NOTE — Progress Notes (Signed)
[name] - 64 y.o. female MRN [id number]  Date of birth: 1955-10-08  Subjective Chief Complaint  Patient presents with  . Dizziness    HPI [name] is a 64 y.o. female with history of HTN, HLD, depression with anxiety and breast cancer here today with complaint of dizziness.  Reports that [name] woke up this morning and felt dizzy and has felt fatigued this morning. [name] denies associated chest pain, shortness of breath, weakness. [name] has felt anxious.  [name] is worried about her weight because her oncologist told her [name] needed to lose 12 lbs before her next appointment.  [name] has been "stress eating" recently and had a milkshake and candy bar yesterday.  [name] has worried about this and how it will affect her weight.    ROS:  A comprehensive ROS was completed and negative except as noted per HPI  Allergies  Allergen Reactions  . Penicillins Shortness Of Breath    Reaction as a child Did it involve swelling of the face/tongue/throat, SOB, or low BP? Yes Did it involve sudden or severe rash/hives, skin peeling, or any reaction on the inside of your mouth or nose?Unknown Did you need to seek medical attention at a hospital or doctor's office? Yes When did it last happen? Childhood reaction. If all above answers are "NO", may proceed with cephalosporin use.   . Sulfa Antibiotics Nausea Only  . Effexor [Venlafaxine] Other (See Comments)    "dopey"    Past Medical History:  Diagnosis Date  . Anxiety   . Asthma   . Breast cancer ([place])   . Chest pain, atypical   . Closed head injury 06/06/2017  . Depression   . Heart palpitations    hx of MVP  . Hypertension   . IBS (irritable bowel syndrome)   . Pre-diabetes     Past Surgical History:  Procedure Laterality Date  . BREAST SURGERY  06/2018   right and left breast  . eAB     x2   . MASTECTOMY MODIFIED RADICAL Bilateral 10/10/2018   Procedure: RIGHT MODIFIED RADICAL MASTECTOMY AND LEFT PROPHYLATIC MASTECTOMY;  Surgeon:  [name], MD;  Location: [facility];  Service: General;  Laterality: Bilateral;  . PORTACATH PLACEMENT Left 08/12/2018   Procedure: INSERTION PORT-A-CATH;  Surgeon: [name], MD;  Location: [facility];  Service: General;  Laterality: Left;    Social History   Socioeconomic History  . Marital status: Divorced    Spouse name: Not on file  . Number of children: Not on file  . Years of education: Not on file  . Highest education level: Not on file  Occupational History  . Not on file  Tobacco Use  . Smoking status: Never Smoker  . Smokeless tobacco: Never Used  . Tobacco comment: nonsmoker  Substance and Sexual Activity  . Alcohol use: No  . Drug use: No  . Sexual activity: Not Currently    Birth control/protection: None  Other Topics Concern  . Not on file  Social History Narrative   Divorced; grown son lives in [place].    [facility]- PCP   Social Determinants of Health   Financial Resource Strain:   . Difficulty of Paying Living Expenses:   Food Insecurity:   . Worried About Charity fundraiser in the Last Year:   . Arboriculturist in the Last Year:   Transportation Needs:   . Film/video editor (Medical):   Marland Kitchen  Lack of Transportation (Non-Medical):   Physical Activity:   . Days of Exercise per Week:   . Minutes of Exercise per Session:   Stress:   . Feeling of Stress :   Social Connections:   . Frequency of Communication with Friends and Family:   . Frequency of Social Gatherings with Friends and Family:   . Attends Religious Services:   . Active Member of Clubs or Organizations:   . Attends Archivist Meetings:   Marland Kitchen Marital Status:     Family History  Problem Relation Age of Onset  . Heart attack Father   . Lung cancer Mother   . Bladder Cancer Brother     Health Maintenance  Topic Date Due  . DEXA SCAN  11/23/2020 (Originally 11/23/2019)  . PNA vac Low Risk Adult (1 of 2 - PCV13) 11/23/2020 (Originally  11/23/2019)  . COLONOSCOPY  12/31/2020 (Originally 11/22/2004)  . INFLUENZA VACCINE  02/29/2020  . MAMMOGRAM  07/11/2020  . PAP SMEAR-Modifier  01/26/2022  . TETANUS/TDAP  03/28/2028  . Hepatitis C Screening  Completed  . HIV Screening  Completed  . COVID-19 Vaccine  Discontinued     ----------------------------------------------------------------------------------------------------------------------------------------------------------------------------------------------------------------- Physical Exam BP 122/70 (BP Location: Left Arm, Patient Position: Sitting, Cuff Size: Normal)   Pulse 81   Temp 97.9 F (36.6 C) (Oral)   Wt 188 lb 0.6 oz (85.3 kg)   BMI 31.78 kg/m   Physical Exam Constitutional:      Appearance: Normal appearance.  HENT:     Head: Normocephalic and atraumatic.  Eyes:     General: No scleral icterus. Cardiovascular:     Rate and Rhythm: Normal rate and regular rhythm.  Pulmonary:     Effort: Pulmonary effort is normal.     Breath sounds: Normal breath sounds.  Musculoskeletal:     Cervical back: Neck supple.  Skin:    General: Skin is warm and dry.  Neurological:     Mental Status: [name] is alert.     ------------------------------------------------------------------------------------------------------------------------------------------------------------------------------------------------------------------- Assessment and Plan  GAD (generalized anxiety disorder) -Current symptoms seem to be anxiety related.  -[name] will continue citalopram with addition of bupropion as this may help some with her disordered eating as well.   Dizziness Orthostatics without significant drop in BP or HR change.  Possibly related to anxiety or hyperglycemia from increased intake of sugary foods.  Orders Placed This Encounter  Procedures  . COMPLETE METABOLIC PANEL WITH GFR  . CBC  . TSH  . HgB A1c      No orders of the defined types were placed in this  encounter.   No follow-ups on file.    This visit occurred during the SARS-CoV-2 public health emergency.  Safety protocols were in place, including screening questions prior to the visit, additional usage of staff PPE, and extensive cleaning of exam room while observing appropriate contact time as indicated for disinfecting solutions.

## 2020-01-02 ENCOUNTER — Encounter: Payer: Self-pay | Admitting: Family Medicine

## 2020-01-03 ENCOUNTER — Encounter: Payer: Self-pay | Admitting: Family Medicine

## 2020-01-04 ENCOUNTER — Encounter: Payer: Self-pay | Admitting: Family Medicine

## 2020-01-05 ENCOUNTER — Other Ambulatory Visit: Payer: Self-pay | Admitting: Family Medicine

## 2020-01-05 DIAGNOSIS — E782 Mixed hyperlipidemia: Secondary | ICD-10-CM

## 2020-01-09 ENCOUNTER — Encounter: Payer: Self-pay | Admitting: Family Medicine

## 2020-01-12 ENCOUNTER — Encounter: Payer: Self-pay | Admitting: Family Medicine

## 2020-01-13 ENCOUNTER — Other Ambulatory Visit: Payer: Self-pay

## 2020-01-13 MED ORDER — BUDESONIDE-FORMOTEROL FUMARATE 160-4.5 MCG/ACT IN AERO: 2.0000 | INHALATION_SPRAY | Freq: Two times a day (BID) | RESPIRATORY_TRACT | 0 refills | Status: DC

## 2020-01-15 DIAGNOSIS — Z170 Estrogen receptor positive status [ER+]: Secondary | ICD-10-CM | POA: Diagnosis not present

## 2020-01-15 DIAGNOSIS — M25511 Pain in right shoulder: Secondary | ICD-10-CM | POA: Diagnosis not present

## 2020-01-15 DIAGNOSIS — R21 Rash and other nonspecific skin eruption: Secondary | ICD-10-CM | POA: Diagnosis not present

## 2020-01-15 DIAGNOSIS — C50811 Malignant neoplasm of overlapping sites of right female breast: Secondary | ICD-10-CM | POA: Diagnosis not present

## 2020-01-15 DIAGNOSIS — I10 Essential (primary) hypertension: Secondary | ICD-10-CM | POA: Diagnosis not present

## 2020-01-17 ENCOUNTER — Encounter: Payer: Self-pay | Admitting: Family Medicine

## 2020-01-22 ENCOUNTER — Encounter: Payer: Self-pay | Admitting: Family Medicine

## 2020-01-23 ENCOUNTER — Telehealth: Payer: Self-pay | Admitting: Family Medicine

## 2020-01-23 ENCOUNTER — Encounter: Payer: Self-pay | Admitting: Family Medicine

## 2020-01-23 NOTE — Telephone Encounter (Signed)
Called patient and discussed concerns about vaccine.  All questions answered.  Discussed that I would recommend that she receive COVID vaccine.  She has appt scheduled for next week through [facility].

## 2020-01-26 ENCOUNTER — Other Ambulatory Visit: Payer: Medicare Other

## 2020-01-26 ENCOUNTER — Encounter: Payer: Self-pay | Admitting: Family Medicine

## 2020-01-28 ENCOUNTER — Encounter: Payer: Self-pay | Admitting: Family Medicine

## 2020-02-02 ENCOUNTER — Other Ambulatory Visit: Payer: Medicare Other

## 2020-02-03 ENCOUNTER — Encounter: Payer: Self-pay | Admitting: Family Medicine

## 2020-02-04 DIAGNOSIS — Z20822 Contact with and (suspected) exposure to covid-19: Secondary | ICD-10-CM | POA: Diagnosis not present

## 2020-02-04 DIAGNOSIS — Z03818 Encounter for observation for suspected exposure to other biological agents ruled out: Secondary | ICD-10-CM | POA: Diagnosis not present

## 2020-02-05 ENCOUNTER — Encounter: Payer: Self-pay | Admitting: Family Medicine

## 2020-02-05 ENCOUNTER — Other Ambulatory Visit: Payer: Self-pay

## 2020-02-05 MED ORDER — BUDESONIDE-FORMOTEROL FUMARATE 160-4.5 MCG/ACT IN AERO: 2.0000 | INHALATION_SPRAY | Freq: Two times a day (BID) | RESPIRATORY_TRACT | 0 refills | Status: DC

## 2020-02-05 NOTE — Progress Notes (Signed)
Faxed Rx for Symbicort to [facility] [phone])

## 2020-02-09 ENCOUNTER — Encounter: Payer: Self-pay | Admitting: Family Medicine

## 2020-03-01 ENCOUNTER — Encounter: Payer: Self-pay | Admitting: Family Medicine

## 2020-03-04 ENCOUNTER — Encounter: Payer: Self-pay | Admitting: Family Medicine

## 2020-03-10 ENCOUNTER — Encounter: Payer: Self-pay | Admitting: Family Medicine

## 2020-03-10 ENCOUNTER — Telehealth: Payer: Self-pay

## 2020-03-10 ENCOUNTER — Telehealth: Payer: Self-pay | Admitting: *Deleted

## 2020-03-10 DIAGNOSIS — Z880 Allergy status to penicillin: Secondary | ICD-10-CM | POA: Diagnosis not present

## 2020-03-10 DIAGNOSIS — Z2914 Encounter for prophylactic rabies immune globin: Secondary | ICD-10-CM | POA: Diagnosis not present

## 2020-03-10 DIAGNOSIS — Z203 Contact with and (suspected) exposure to rabies: Secondary | ICD-10-CM | POA: Insufficient documentation

## 2020-03-10 DIAGNOSIS — S60511A Abrasion of right hand, initial encounter: Secondary | ICD-10-CM | POA: Diagnosis not present

## 2020-03-10 DIAGNOSIS — S61451A Open bite of right hand, initial encounter: Secondary | ICD-10-CM | POA: Diagnosis not present

## 2020-03-10 DIAGNOSIS — Z882 Allergy status to sulfonamides status: Secondary | ICD-10-CM | POA: Diagnosis not present

## 2020-03-10 DIAGNOSIS — Z859 Personal history of malignant neoplasm, unspecified: Secondary | ICD-10-CM | POA: Diagnosis not present

## 2020-03-10 DIAGNOSIS — Z7982 Long term (current) use of aspirin: Secondary | ICD-10-CM | POA: Diagnosis not present

## 2020-03-10 DIAGNOSIS — Z888 Allergy status to other drugs, medicaments and biological substances status: Secondary | ICD-10-CM | POA: Diagnosis not present

## 2020-03-10 DIAGNOSIS — Z23 Encounter for immunization: Secondary | ICD-10-CM | POA: Diagnosis not present

## 2020-03-10 DIAGNOSIS — Z79899 Other long term (current) drug therapy: Secondary | ICD-10-CM | POA: Diagnosis not present

## 2020-03-10 DIAGNOSIS — Z79811 Long term (current) use of aromatase inhibitors: Secondary | ICD-10-CM | POA: Diagnosis not present

## 2020-03-10 NOTE — Telephone Encounter (Signed)
[name] states she was bitten by a unknown kitten. Advised patient to go to the ED to determine if the start of the rabies vaccine is needed.

## 2020-03-10 NOTE — Telephone Encounter (Signed)
Pt called reports that she was "scratched" by a Guinea cat this morning. She called the sheriff they believe that she was bitten. I advised her to proceed to the ED for further evaluation and rabies vaccines since the cats rabies status is unknown.As her PCP had already recommended.  Pt does not want to go to the ED. I reiterated several times the importance of the evaluation and rabies vaccines. She abruptly ended the conversation and hung up.

## 2020-03-11 ENCOUNTER — Encounter: Payer: Self-pay | Admitting: Family Medicine

## 2020-03-11 NOTE — Telephone Encounter (Signed)
Can you please review the notes from ER and advise patient of next steps? She is very worried about next steps.. can you please respond to her on MyChart

## 2020-03-13 DIAGNOSIS — Z203 Contact with and (suspected) exposure to rabies: Secondary | ICD-10-CM | POA: Diagnosis not present

## 2020-03-13 DIAGNOSIS — Z23 Encounter for immunization: Secondary | ICD-10-CM | POA: Diagnosis not present

## 2020-03-17 ENCOUNTER — Ambulatory Visit: Payer: Medicare Other | Admitting: Family Medicine

## 2020-03-17 DIAGNOSIS — Z203 Contact with and (suspected) exposure to rabies: Secondary | ICD-10-CM | POA: Diagnosis not present

## 2020-03-17 DIAGNOSIS — Z23 Encounter for immunization: Secondary | ICD-10-CM | POA: Diagnosis not present

## 2020-03-18 ENCOUNTER — Ambulatory Visit: Payer: Medicare Other | Admitting: Family Medicine

## 2020-03-18 ENCOUNTER — Encounter: Payer: Self-pay | Admitting: Family Medicine

## 2020-03-18 ENCOUNTER — Ambulatory Visit (INDEPENDENT_AMBULATORY_CARE_PROVIDER_SITE_OTHER): Payer: Medicare Other | Admitting: Family Medicine

## 2020-03-18 DIAGNOSIS — W5501XA Bitten by cat, initial encounter: Secondary | ICD-10-CM | POA: Insufficient documentation

## 2020-03-18 DIAGNOSIS — R238 Other skin changes: Secondary | ICD-10-CM

## 2020-03-18 DIAGNOSIS — M7989 Other specified soft tissue disorders: Secondary | ICD-10-CM | POA: Diagnosis not present

## 2020-03-18 NOTE — Progress Notes (Signed)
MERCI [name] - 64 y.o. female MRN [id number]  Date of birth: 1955-10-08  Subjective Chief Complaint  Patient presents with  . Animal Bite    HPI [name] is a 64 y.o. female here today for follow up of recent animal bite.  She had bite/scratch to R wrist after helping remove kitten from storm drain.  Vaccination status of kitten is unknown.  It was recommended that she go to ED for rabies vaccination which she has received 3/4 doses.  She states she think it is likely just a scratch and feels like the vaccines are unnecessary.  She denies increased pain around this area.  She has felt tired with the vaccination series.    She also has noted some swelling to her right arm but this started prior to bite and vaccine.  She has also noticed some redness and notices patterns of raised lines, sometimes in a zigzag or circular pattern.  This seems to worsen if rubbing or scratching.   ROS:  A comprehensive ROS was completed and negative except as noted per HPI  Allergies  Allergen Reactions  . Penicillins Shortness Of Breath    Reaction as a child Did it involve swelling of the face/tongue/throat, SOB, or low BP? Yes Did it involve sudden or severe rash/hives, skin peeling, or any reaction on the inside of your mouth or nose?Unknown Did you need to seek medical attention at a hospital or doctor's office? Yes When did it last happen? Childhood reaction. If all above answers are "NO", may proceed with cephalosporin use.   . Sulfa Antibiotics Nausea Only  . Effexor [Venlafaxine] Other (See Comments)    "dopey"    Past Medical History:  Diagnosis Date  . Anxiety   . Asthma   . Breast cancer ([place])   . Chest pain, atypical   . Closed head injury 06/06/2017  . Depression   . Heart palpitations    hx of MVP  . Hypertension   . IBS (irritable bowel syndrome)   . Pre-diabetes     Past Surgical History:  Procedure Laterality Date  . BREAST SURGERY  06/2018   right and left breast   . eAB     x2   . MASTECTOMY MODIFIED RADICAL Bilateral 10/10/2018   Procedure: RIGHT MODIFIED RADICAL MASTECTOMY AND LEFT PROPHYLATIC MASTECTOMY;  Surgeon: [name], MD;  Location: [facility];  Service: General;  Laterality: Bilateral;  . PORTACATH PLACEMENT Left 08/12/2018   Procedure: INSERTION PORT-A-CATH;  Surgeon: [name], MD;  Location: [facility];  Service: General;  Laterality: Left;    Social History   Socioeconomic History  . Marital status: Divorced    Spouse name: Not on file  . Number of children: Not on file  . Years of education: Not on file  . Highest education level: Not on file  Occupational History  . Not on file  Tobacco Use  . Smoking status: Never Smoker  . Smokeless tobacco: Never Used  . Tobacco comment: nonsmoker  Vaping Use  . Vaping Use: Never used  Substance and Sexual Activity  . Alcohol use: No  . Drug use: No  . Sexual activity: Not Currently    Birth control/protection: None  Other Topics Concern  . Not on file  Social History Narrative   Divorced; grown son lives in [place].    [facility]- P[facility]   Social Determinants of Health   Financial Resource Strain:   . Difficulty of  Paying Living Expenses: Not on file  Food Insecurity:   . Worried About Charity fundraiser in the Last Year: Not on file  . Ran Out of Food in the Last Year: Not on file  Transportation Needs:   . Lack of Transportation (Medical): Not on file  . Lack of Transportation (Non-Medical): Not on file  Physical Activity:   . Days of Exercise per Week: Not on file  . Minutes of Exercise per Session: Not on file  Stress:   . Feeling of Stress : Not on file  Social Connections:   . Frequency of Communication with Friends and Family: Not on file  . Frequency of Social Gatherings with Friends and Family: Not on file  . Attends Religious Services: Not on file  . Active Member of Clubs or Organizations: Not on file  . Attends Theatre manager Meetings: Not on file  . Marital Status: Not on file    Family History  Problem Relation Age of Onset  . Heart attack Father   . Lung cancer Mother   . Bladder Cancer Brother     Health Maintenance  Topic Date Due  . INFLUENZA VACCINE  02/29/2020  . DEXA SCAN  11/23/2020 (Originally 11/23/2019)  . PNA vac Low Risk Adult (1 of 2 - PCV13) 11/23/2020 (Originally 11/23/2019)  . COLONOSCOPY  12/31/2020 (Originally 11/22/2004)  . MAMMOGRAM  07/11/2020  . PAP SMEAR-Modifier  01/26/2022  . TETANUS/TDAP  03/28/2028  . Hepatitis C Screening  Completed  . HIV Screening  Completed  . COVID-19 Vaccine  Discontinued     ----------------------------------------------------------------------------------------------------------------------------------------------------------------------------------------------------------------- Physical Exam BP (!) 150/60 (BP Location: Left Arm, Patient Position: Sitting, Cuff Size: Normal)   Pulse 80   Temp 97.6 F (36.4 C) (Temporal)   Wt 187 lb 12.8 oz (85.2 kg)   SpO2 98%   BMI 31.74 kg/m   Physical Exam Constitutional:      Appearance: Normal appearance.  Eyes:     General: No scleral icterus. Cardiovascular:     Rate and Rhythm: Normal rate and regular rhythm.  Pulmonary:     Effort: Pulmonary effort is normal.     Breath sounds: Normal breath sounds.  Musculoskeletal:     Cervical back: Neck supple.  Skin:    Comments: Edema of R arm with erythema.  There are scattered eczematous patches.  Dermatographism present.    Neurological:     General: No focal deficit present.     Mental Status: She is alert.  Psychiatric:        Mood and Affect: Mood normal.        Behavior: Behavior normal.     ------------------------------------------------------------------------------------------------------------------------------------------------------------------------------------------------------------------- Assessment and  Plan  Cat bite No signs of infection Encouraged to complete rabies vaccine series.    Redness and swelling of upper arm She has eczema with dermatographism of the arm.  Recommend antihistamine and topical steroid cream as needed.  Appears to have lymphedema.  She has discussed this with her oncologist as well and plans to purchase compression sleeve.   I think DVT is less likely but can consider [place] if not improving.     No orders of the defined types were placed in this encounter.   No follow-ups on file.    This visit occurred during the SARS-CoV-2 public health emergency.  Safety protocols were in place, including screening questions prior to the visit, additional usage of staff PPE, and extensive cleaning of exam room while observing appropriate contact time  as indicated for disinfecting solutions.

## 2020-03-18 NOTE — Assessment & Plan Note (Signed)
No signs of infection Encouraged to complete rabies vaccine series.

## 2020-03-18 NOTE — Assessment & Plan Note (Signed)
She has eczema with dermatographism of the arm.  Recommend antihistamine and topical steroid cream as needed.  Appears to have lymphedema.  She has discussed this with her oncologist as well and plans to purchase compression sleeve.   I think DVT is less likely but can consider [place] if not improving.

## 2020-03-18 NOTE — Patient Instructions (Signed)
Try using compression sleeve for swelling.  Take antihistamine(claritin) and pepcid  You may use eczema cream to area as well.  If swelling is not improving let me know.

## 2020-03-19 ENCOUNTER — Encounter: Payer: Self-pay | Admitting: Family Medicine

## 2020-03-22 ENCOUNTER — Encounter: Payer: Self-pay | Admitting: Family Medicine

## 2020-03-23 ENCOUNTER — Encounter: Payer: Self-pay | Admitting: Family Medicine

## 2020-03-24 DIAGNOSIS — Z203 Contact with and (suspected) exposure to rabies: Secondary | ICD-10-CM | POA: Diagnosis not present

## 2020-03-24 DIAGNOSIS — Z23 Encounter for immunization: Secondary | ICD-10-CM | POA: Diagnosis not present

## 2020-04-02 ENCOUNTER — Encounter: Payer: Self-pay | Admitting: Family Medicine

## 2020-04-06 ENCOUNTER — Encounter: Payer: Self-pay | Admitting: Family Medicine

## 2020-04-12 ENCOUNTER — Encounter: Payer: Self-pay | Admitting: Family Medicine

## 2020-04-12 DIAGNOSIS — R7303 Prediabetes: Secondary | ICD-10-CM | POA: Diagnosis not present

## 2020-04-12 DIAGNOSIS — I10 Essential (primary) hypertension: Secondary | ICD-10-CM | POA: Diagnosis not present

## 2020-04-13 LAB — HEMOGLOBIN A1C
Hgb A1c MFr Bld: 6.5 % of total Hgb — ABNORMAL HIGH (ref <5.7)
Mean Plasma Glucose: 140 (calc)
eAG (mmol/L): 7.7 (calc)

## 2020-04-16 ENCOUNTER — Encounter: Payer: Self-pay | Admitting: Family Medicine

## 2020-04-19 ENCOUNTER — Encounter: Payer: Self-pay | Admitting: Family Medicine

## 2020-04-19 ENCOUNTER — Telehealth (INDEPENDENT_AMBULATORY_CARE_PROVIDER_SITE_OTHER): Payer: Medicare Other | Admitting: Family Medicine

## 2020-04-19 DIAGNOSIS — E1169 Type 2 diabetes mellitus with other specified complication: Secondary | ICD-10-CM | POA: Diagnosis not present

## 2020-04-19 DIAGNOSIS — E119 Type 2 diabetes mellitus without complications: Secondary | ICD-10-CM | POA: Insufficient documentation

## 2020-04-19 NOTE — Progress Notes (Signed)
[name] - 64 y.o. female MRN [id number]  Date of birth: 20-Jun-1956   This visit type was conducted due to national recommendations for restrictions regarding the COVID-19 Pandemic (e.g. social distancing).  This format is felt to be most appropriate for this patient at this time.  All issues noted in this document were discussed and addressed.  No physical exam was performed (except for noted visual exam findings with Video Visits).  I discussed the limitations of evaluation and management by telemedicine and the availability of in person appointments. The patient expressed understanding and agreed to proceed.  I connected with@ on 04/19/20 at  3:00 PM EDT by a video enabled telemedicine application and verified that I am speaking with the correct person using two identifiers.  Interactive audio and video telecommunications were attempted between this provider and patient, however failed, due to patient having technical difficulties OR patient did not have access to video capability.  We continued and completed visit with audio only.    Present at visit: Luetta Nutting, DO Janalee Dane   Patient Location: Home [address] [place] [id number]   Provider location:   [place]  No chief complaint on file.   HPI  [name] is a 64 y.o. female who presents via audio/video conferencing for a telehealth visit today.  She would like to discuss recent labs and A1c levels.  She reports that previous PCP did not seem very concerned with her A1c being above 6.5% and told her that she still had prediabetes.  We had discussed making dietary changes however a1c increased from last check.  She admits that she hasn't been very good about following a healthy diet but plans to work on this more.     ROS:  A comprehensive ROS was completed and negative except as noted per HPI  Past Medical History:  Diagnosis Date  . Anxiety   . Asthma   . Breast cancer ([place])   . Chest pain, atypical   .  Closed head injury 06/06/2017  . Depression   . Heart palpitations    hx of MVP  . Hypertension   . IBS (irritable bowel syndrome)   . Pre-diabetes     Past Surgical History:  Procedure Laterality Date  . BREAST SURGERY  06/2018   right and left breast  . eAB     x2   . MASTECTOMY MODIFIED RADICAL Bilateral 10/10/2018   Procedure: RIGHT MODIFIED RADICAL MASTECTOMY AND LEFT PROPHYLATIC MASTECTOMY;  Surgeon: [name], MD;  Location: [facility];  Service: General;  Laterality: Bilateral;  . PORTACATH PLACEMENT Left 08/12/2018   Procedure: INSERTION PORT-A-CATH;  Surgeon: [name], MD;  Location: [facility];  Service: General;  Laterality: Left;    Family History  Problem Relation Age of Onset  . Heart attack Father   . Lung cancer Mother   . Bladder Cancer Brother     Social History   Socioeconomic History  . Marital status: Divorced    Spouse name: Not on file  . Number of children: Not on file  . Years of education: Not on file  . Highest education level: Not on file  Occupational History  . Not on file  Tobacco Use  . Smoking status: Never Smoker  . Smokeless tobacco: Never Used  . Tobacco comment: nonsmoker  Vaping Use  . Vaping Use: Never used  Substance and Sexual Activity  . Alcohol use: No  . Drug use:  No  . Sexual activity: Not Currently    Birth control/protection: None  Other Topics Concern  . Not on file  Social History Narrative   Divorced; grown son lives in [place].    [facility]- PCP   Social Determinants of Health   Financial Resource Strain:   . Difficulty of Paying Living Expenses: Not on file  Food Insecurity:   . Worried About Charity fundraiser in the Last Year: Not on file  . Ran Out of Food in the Last Year: Not on file  Transportation Needs:   . Lack of Transportation (Medical): Not on file  . Lack of Transportation (Non-Medical): Not on file  Physical Activity:   . Days of Exercise per Week: Not on  file  . Minutes of Exercise per Session: Not on file  Stress:   . Feeling of Stress : Not on file  Social Connections:   . Frequency of Communication with Friends and Family: Not on file  . Frequency of Social Gatherings with Friends and Family: Not on file  . Attends Religious Services: Not on file  . Active Member of Clubs or Organizations: Not on file  . Attends Archivist Meetings: Not on file  . Marital Status: Not on file  Intimate Partner Violence:   . Fear of Current or Ex-Partner: Not on file  . Emotionally Abused: Not on file  . Physically Abused: Not on file  . Sexually Abused: Not on file     Current Outpatient Medications:  .  albuterol (VENTOLIN HFA) 108 (90 Base) MCG/ACT inhaler, Inhale 2 puffs into the lungs every 6 (six) hours as needed for wheezing or shortness of breath., Disp: 6.7 g, Rfl: 0 .  aspirin EC 81 MG tablet, Take 81 mg by mouth 4 (four) times a week., Disp: , Rfl:  .  B Complex-C (B-COMPLEX WITH VITAMIN C) tablet, Take 1 tablet by mouth daily after lunch., Disp: , Rfl:  .  budesonide-formoterol (SYMBICORT) 160-4.5 MCG/ACT inhaler, Inhale 2 puffs into the lungs 2 (two) times daily., Disp: 3 Inhaler, Rfl: 0 .  Calcium Ascorbate (BUFFERED C PO), Take 1 tablet by mouth daily after lunch. Buffered C-Complex, Disp: , Rfl:  .  Calcium-Magnesium (CAL-MAG PO), Take 1 tablet by mouth 2 (two) times daily., Disp: , Rfl:  .  CHLOROPHYLL PO, Take 1 tablet by mouth every other day., Disp: , Rfl:  .  citalopram (CELEXA) 20 MG tablet, Take 1 tablet (20 mg total) by mouth daily., Disp: 90 tablet, Rfl: 0 .  clotrimazole-betamethasone (LOTRISONE) cream, Apply topically 2 (two) times daily., Disp: , Rfl:  .  doxycycline (VIBRAMYCIN) 100 MG capsule, Take 100 mg by mouth 2 (two) times daily., Disp: , Rfl:  .  Garlic Oil 7253 MG CAPS, Take 1 capsule by mouth daily. , Disp: , Rfl:  .  Lactobacillus (ACIDOPHILUS PO), Take 1 capsule by mouth daily., Disp: , Rfl:  .   letrozole (FEMARA) 2.5 MG tablet, Take 2.5 mg by mouth daily., Disp: , Rfl:  .  lisinopril (ZESTRIL) 10 MG tablet, Take 1 tablet (10 mg total) by mouth in the morning and at bedtime., Disp: 180 tablet, Rfl: 1 .  loratadine (CLARITIN) 10 MG tablet, Take 1 tablet (10 mg total) by mouth daily. (Patient taking differently: Take 10 mg by mouth daily as needed for allergies. ), Disp: 90 tablet, Rfl: 3 .  Misc Natural Products (TURMERIC CURCUMIN) CAPS, Take 1 capsule by mouth daily after lunch., Disp: ,  Rfl:  .  mupirocin ointment (BACTROBAN) 2 %, Apply to wound 3 times daily for 5 days, Disp: 30 g, Rfl: 0 .  Omega-3 Fatty Acids (FISH OIL) 1000 MG CAPS, Take 1 capsule by mouth daily. Ultimate Omega, Disp: , Rfl:  .  triamcinolone cream (KENALOG) 0.5 %, Apply 1 application topically 2 (two) times daily. To affected areas., Disp: 454 g, Rfl: 3 .  Vitamin D-Vitamin K (D3 + K2 DOTS PO), Take 1 tablet by mouth daily., Disp: , Rfl:   EXAM:  VITALS per patient if applicable: BP 672/09   Temp 98.3 F (36.8 C)   Wt 180 lb (81.6 kg)   BMI 30.42 kg/m   GENERAL: alert, oriented,in no acute distress   PSYCH/NEURO: pleasant and cooperative, no obvious depression or anxiety, speech and thought processing grossly intact  ASSESSMENT AND PLAN:  Discussed the following assessment and plan:  T2DM (type 2 diabetes mellitus) (HCC) A1c in diabetes range.  Discussed importance of making lifestyle change to prevent further progression of her diabetes.   She states that she will start working on reduction in carbohydrates and incorporation of exercise.       I discussed the assessment and treatment plan with the patient. The patient was provided an opportunity to ask questions and all were answered. The patient agreed with the plan and demonstrated an understanding of the instructions.   The patient was advised to call back or seek an in-person evaluation if the symptoms worsen or if the condition fails to  improve as anticipated.  15 minutes of telephone time spent with patient.   Luetta Nutting, DO

## 2020-04-19 NOTE — Progress Notes (Signed)
States she ate properly and drank lots of water today. Blood sugar = 122  She feels she needs to take better care of herself instead of starting medications.

## 2020-04-19 NOTE — Assessment & Plan Note (Signed)
A1c in diabetes range.  Discussed importance of making lifestyle change to prevent further progression of her diabetes.   She states that she will start working on reduction in carbohydrates and incorporation of exercise.

## 2020-04-20 ENCOUNTER — Encounter: Payer: Self-pay | Admitting: Family Medicine

## 2020-04-20 ENCOUNTER — Other Ambulatory Visit: Payer: Self-pay

## 2020-04-20 ENCOUNTER — Ambulatory Visit (INDEPENDENT_AMBULATORY_CARE_PROVIDER_SITE_OTHER): Payer: Medicare Other | Admitting: Family Medicine

## 2020-04-20 DIAGNOSIS — S0990XA Unspecified injury of head, initial encounter: Secondary | ICD-10-CM | POA: Diagnosis not present

## 2020-04-20 DIAGNOSIS — E1169 Type 2 diabetes mellitus with other specified complication: Secondary | ICD-10-CM

## 2020-04-20 MED ORDER — SEMAGLUTIDE-WEIGHT MANAGEMENT 0.25 MG/0.5ML ~~LOC~~ SOAJ
0.2500 mg | SUBCUTANEOUS | 0 refills | Status: AC
Start: 2020-04-20 — End: 2020-05-18

## 2020-04-20 MED ORDER — SEMAGLUTIDE-WEIGHT MANAGEMENT 1 MG/0.5ML ~~LOC~~ SOAJ: 1.0000 mg | SUBCUTANEOUS | 0 refills | Status: AC

## 2020-04-20 MED ORDER — SEMAGLUTIDE-WEIGHT MANAGEMENT 1.7 MG/0.75ML ~~LOC~~ SOAJ: 1.7000 mg | SUBCUTANEOUS | 0 refills | Status: AC

## 2020-04-20 MED ORDER — SEMAGLUTIDE-WEIGHT MANAGEMENT 0.5 MG/0.5ML ~~LOC~~ SOAJ: 0.5000 mg | SUBCUTANEOUS | 0 refills | Status: AC

## 2020-04-20 MED ORDER — SEMAGLUTIDE-WEIGHT MANAGEMENT 2.4 MG/0.75ML ~~LOC~~ SOAJ: 2.4000 mg | SUBCUTANEOUS | 1 refills | Status: DC

## 2020-04-20 NOTE — Patient Instructions (Signed)
Let's see if we can get [facility] approved.  Let's plan to follow up in about 6 weeks after starting medication.

## 2020-04-21 ENCOUNTER — Encounter: Payer: Self-pay | Admitting: Family Medicine

## 2020-04-21 DIAGNOSIS — C792 Secondary malignant neoplasm of skin: Secondary | ICD-10-CM | POA: Diagnosis not present

## 2020-04-21 DIAGNOSIS — I10 Essential (primary) hypertension: Secondary | ICD-10-CM | POA: Diagnosis not present

## 2020-04-21 DIAGNOSIS — C773 Secondary and unspecified malignant neoplasm of axilla and upper limb lymph nodes: Secondary | ICD-10-CM | POA: Diagnosis not present

## 2020-04-21 DIAGNOSIS — T300 Burn of unspecified body region, unspecified degree: Secondary | ICD-10-CM | POA: Diagnosis not present

## 2020-04-21 DIAGNOSIS — J4530 Mild persistent asthma, uncomplicated: Secondary | ICD-10-CM | POA: Diagnosis not present

## 2020-04-21 DIAGNOSIS — Z9013 Acquired absence of bilateral breasts and nipples: Secondary | ICD-10-CM | POA: Diagnosis not present

## 2020-04-21 DIAGNOSIS — Z170 Estrogen receptor positive status [ER+]: Secondary | ICD-10-CM | POA: Diagnosis not present

## 2020-04-21 DIAGNOSIS — R768 Other specified abnormal immunological findings in serum: Secondary | ICD-10-CM | POA: Diagnosis not present

## 2020-04-21 DIAGNOSIS — M791 Myalgia, unspecified site: Secondary | ICD-10-CM | POA: Diagnosis not present

## 2020-04-21 DIAGNOSIS — C50811 Malignant neoplasm of overlapping sites of right female breast: Secondary | ICD-10-CM | POA: Diagnosis not present

## 2020-04-21 DIAGNOSIS — M2550 Pain in unspecified joint: Secondary | ICD-10-CM | POA: Diagnosis not present

## 2020-04-21 DIAGNOSIS — Z203 Contact with and (suspected) exposure to rabies: Secondary | ICD-10-CM | POA: Diagnosis not present

## 2020-04-22 DIAGNOSIS — S0990XA Unspecified injury of head, initial encounter: Secondary | ICD-10-CM | POA: Insufficient documentation

## 2020-04-22 NOTE — Assessment & Plan Note (Signed)
Mild discomfort related to hitting back of her head on chair.  Non-focal neuro exam.  No red flag symptoms.

## 2020-04-22 NOTE — Progress Notes (Signed)
[name] - 64 y.o. female MRN [id number]  Date of birth: Apr 09, 1956  Subjective No chief complaint on file.   HPI [name] is a 64 y.o. female here today to further discuss recent a1c.    She is making changes to her diet to improve a1c but has found it difficult for her to lose weight. She has declined starting metformin previously.  She would be open to trying injection to help with blood sugars and weight loss.    She also reports that she hit her head on the edge of a chair when getting up from the floor.  Mild tenderness along back of the head but this is improving.  She denies dizziness, fatigue, nausea or vision change.   ROS:  A comprehensive ROS was completed and negative except as noted per HPI  Allergies  Allergen Reactions  . Penicillins Shortness Of Breath    Reaction as a child Did it involve swelling of the face/tongue/throat, SOB, or low BP? Yes Did it involve sudden or severe rash/hives, skin peeling, or any reaction on the inside of your mouth or nose?Unknown Did you need to seek medical attention at a hospital or doctor's office? Yes When did it last happen? Childhood reaction. If all above answers are "NO", may proceed with cephalosporin use.   . Sulfa Antibiotics Nausea Only  . Effexor [Venlafaxine] Other (See Comments)    "dopey"    Past Medical History:  Diagnosis Date  . Anxiety   . Asthma   . Breast cancer ([place])   . Chest pain, atypical   . Closed head injury 06/06/2017  . Depression   . Heart palpitations    hx of MVP  . Hypertension   . IBS (irritable bowel syndrome)   . Pre-diabetes     Past Surgical History:  Procedure Laterality Date  . BREAST SURGERY  06/2018   right and left breast  . eAB     x2   . MASTECTOMY MODIFIED RADICAL Bilateral 10/10/2018   Procedure: RIGHT MODIFIED RADICAL MASTECTOMY AND LEFT PROPHYLATIC MASTECTOMY;  Surgeon: [name], MD;  Location: [facility];  Service: General;  Laterality: Bilateral;  .  PORTACATH PLACEMENT Left 08/12/2018   Procedure: INSERTION PORT-A-CATH;  Surgeon: [name], MD;  Location: [facility];  Service: General;  Laterality: Left;    Social History   Socioeconomic History  . Marital status: Divorced    Spouse name: Not on file  . Number of children: Not on file  . Years of education: Not on file  . Highest education level: Not on file  Occupational History  . Not on file  Tobacco Use  . Smoking status: Never Smoker  . Smokeless tobacco: Never Used  . Tobacco comment: nonsmoker  Vaping Use  . Vaping Use: Never used  Substance and Sexual Activity  . Alcohol use: No  . Drug use: No  . Sexual activity: Not Currently    Birth control/protection: None  Other Topics Concern  . Not on file  Social History Narrative   Divorced; grown son lives in [place].    [facility]- PCP   Social Determinants of Health   Financial Resource Strain:   . Difficulty of Paying Living Expenses: Not on file  Food Insecurity:   . Worried About Charity fundraiser in the Last Year: Not on file  . Ran Out of Food in the Last Year: Not on file  Transportation Needs:   .  Lack of Transportation (Medical): Not on file  . Lack of Transportation (Non-Medical): Not on file  Physical Activity:   . Days of Exercise per Week: Not on file  . Minutes of Exercise per Session: Not on file  Stress:   . Feeling of Stress : Not on file  Social Connections:   . Frequency of Communication with Friends and Family: Not on file  . Frequency of Social Gatherings with Friends and Family: Not on file  . Attends Religious Services: Not on file  . Active Member of Clubs or Organizations: Not on file  . Attends Archivist Meetings: Not on file  . Marital Status: Not on file    Family History  Problem Relation Age of Onset  . Heart attack Father   . Lung cancer Mother   . Bladder Cancer Brother     Health Maintenance  Topic Date Due  . FOOT EXAM   Never done  . OPHTHALMOLOGY EXAM  Never done  . INFLUENZA VACCINE  Never done  . DEXA SCAN  11/23/2020 (Originally 11/23/2019)  . PNA vac Low Risk Adult (1 of 2 - PCV13) 11/23/2020 (Originally 11/23/2019)  . COLONOSCOPY  12/31/2020 (Originally 11/22/2004)  . MAMMOGRAM  07/11/2020  . HEMOGLOBIN A1C  10/10/2020  . PAP SMEAR-Modifier  01/26/2022  . TETANUS/TDAP  03/28/2028  . Hepatitis C Screening  Completed  . HIV Screening  Completed  . COVID-19 Vaccine  Discontinued     ----------------------------------------------------------------------------------------------------------------------------------------------------------------------------------------------------------------- Physical Exam BP (!) 151/58 (BP Location: Left Arm, Patient Position: Sitting, Cuff Size: Normal)   Pulse 78   Wt 191 lb (86.6 kg)   SpO2 97%   BMI 32.28 kg/m   Physical Exam Constitutional:      Appearance: Normal appearance.  Eyes:     General: No scleral icterus. Cardiovascular:     Rate and Rhythm: Normal rate and regular rhythm.  Pulmonary:     Effort: Pulmonary effort is normal.     Breath sounds: Normal breath sounds.  Neurological:     General: No focal deficit present.     Mental Status: She is alert.  Psychiatric:        Mood and Affect: Mood normal.        Behavior: Behavior normal.     ------------------------------------------------------------------------------------------------------------------------------------------------------------------------------------------------------------------- Assessment and Plan  T2DM (type 2 diabetes mellitus) ([facility]) She declines to try metformin.  She would be open to trying wegovy which would help with management of weight and blood sugars.  We'll see if we can get this approved for her.  She will continue to work on diet and lifestyle change as well for management of her weight and blood sugars.   Head injury Mild discomfort related to hitting  back of her head on chair.  Non-focal neuro exam.  No red flag symptoms.    Meds ordered this encounter  Medications  . Semaglutide-Weight Management 0.25 MG/0.5ML SOAJ    Sig: Inject 0.25 mg into the skin once a week for 28 days.    Dispense:  2 mL    Refill:  0  . Semaglutide-Weight Management 0.5 MG/0.5ML SOAJ    Sig: Inject 0.5 mg into the skin once a week for 28 days. Start after 0.25mg  completion    Dispense:  2 mL    Refill:  0  . Semaglutide-Weight Management 1 MG/0.5ML SOAJ    Sig: Inject 1 mg into the skin once a week for 28 days. Start after completion of 0.5mg     Dispense:  2 mL    Refill:  0  . Semaglutide-Weight Management 1.7 MG/0.75ML SOAJ    Sig: Inject 1.7 mg into the skin once a week for 28 days. Start after 1mg  completion    Dispense:  3 mL    Refill:  0  . Semaglutide-Weight Management 2.4 MG/0.75ML SOAJ    Sig: Inject 2.4 mg into the skin once a week for 28 days. Start after 1.7mg  completion    Dispense:  3 mL    Refill:  1    No follow-ups on file.    This visit occurred during the SARS-CoV-2 public health emergency.  Safety protocols were in place, including screening questions prior to the visit, additional usage of staff PPE, and extensive cleaning of exam room while observing appropriate contact time as indicated for disinfecting solutions.

## 2020-04-22 NOTE — Assessment & Plan Note (Signed)
She declines to try metformin.  She would be open to trying wegovy which would help with management of weight and blood sugars.  We'll see if we can get this approved for her.  She will continue to work on diet and lifestyle change as well for management of her weight and blood sugars.

## 2020-04-23 ENCOUNTER — Encounter: Payer: Self-pay | Admitting: Family Medicine

## 2020-04-26 DIAGNOSIS — Z9013 Acquired absence of bilateral breasts and nipples: Secondary | ICD-10-CM | POA: Diagnosis not present

## 2020-04-26 DIAGNOSIS — M791 Myalgia, unspecified site: Secondary | ICD-10-CM | POA: Diagnosis not present

## 2020-04-26 DIAGNOSIS — M2550 Pain in unspecified joint: Secondary | ICD-10-CM | POA: Diagnosis not present

## 2020-04-26 DIAGNOSIS — C50811 Malignant neoplasm of overlapping sites of right female breast: Secondary | ICD-10-CM | POA: Diagnosis not present

## 2020-04-26 DIAGNOSIS — T300 Burn of unspecified body region, unspecified degree: Secondary | ICD-10-CM | POA: Diagnosis not present

## 2020-04-26 DIAGNOSIS — Z170 Estrogen receptor positive status [ER+]: Secondary | ICD-10-CM | POA: Diagnosis not present

## 2020-04-26 DIAGNOSIS — Z203 Contact with and (suspected) exposure to rabies: Secondary | ICD-10-CM | POA: Diagnosis not present

## 2020-04-26 DIAGNOSIS — I10 Essential (primary) hypertension: Secondary | ICD-10-CM | POA: Diagnosis not present

## 2020-04-26 DIAGNOSIS — C792 Secondary malignant neoplasm of skin: Secondary | ICD-10-CM | POA: Diagnosis not present

## 2020-04-26 DIAGNOSIS — C773 Secondary and unspecified malignant neoplasm of axilla and upper limb lymph nodes: Secondary | ICD-10-CM | POA: Diagnosis not present

## 2020-04-26 DIAGNOSIS — J4530 Mild persistent asthma, uncomplicated: Secondary | ICD-10-CM | POA: Diagnosis not present

## 2020-05-01 ENCOUNTER — Encounter: Payer: Self-pay | Admitting: Family Medicine

## 2020-05-07 ENCOUNTER — Encounter: Payer: Self-pay | Admitting: Family Medicine

## 2020-05-07 DIAGNOSIS — Z1211 Encounter for screening for malignant neoplasm of colon: Secondary | ICD-10-CM

## 2020-05-10 ENCOUNTER — Other Ambulatory Visit: Payer: Self-pay | Admitting: Family Medicine

## 2020-05-10 DIAGNOSIS — Z1211 Encounter for screening for malignant neoplasm of colon: Secondary | ICD-10-CM

## 2020-05-23 ENCOUNTER — Encounter: Payer: Self-pay | Admitting: Family Medicine

## 2020-05-26 ENCOUNTER — Encounter: Payer: Self-pay | Admitting: Family Medicine

## 2020-06-02 ENCOUNTER — Encounter: Payer: Self-pay | Admitting: Family Medicine

## 2020-06-07 ENCOUNTER — Encounter: Payer: Self-pay | Admitting: Family Medicine

## 2020-06-08 ENCOUNTER — Encounter: Payer: Self-pay | Admitting: Family Medicine

## 2020-06-08 DIAGNOSIS — Z1211 Encounter for screening for malignant neoplasm of colon: Secondary | ICD-10-CM | POA: Diagnosis not present

## 2020-06-08 DIAGNOSIS — Z1212 Encounter for screening for malignant neoplasm of rectum: Secondary | ICD-10-CM | POA: Diagnosis not present

## 2020-06-13 ENCOUNTER — Other Ambulatory Visit: Payer: Self-pay | Admitting: Sports Medicine

## 2020-06-18 ENCOUNTER — Encounter: Payer: Self-pay | Admitting: Family Medicine

## 2020-06-23 ENCOUNTER — Encounter: Payer: Self-pay | Admitting: Family Medicine

## 2020-06-23 NOTE — Telephone Encounter (Signed)
I have not seen a result for this yet.  Can we check with [facility] and see if they received it.

## 2020-06-24 LAB — COLOGUARD: Cologuard: POSITIVE — AB

## 2020-06-25 ENCOUNTER — Encounter: Payer: Self-pay | Admitting: Family Medicine

## 2020-06-29 ENCOUNTER — Telehealth: Payer: Self-pay

## 2020-06-29 ENCOUNTER — Other Ambulatory Visit: Payer: Self-pay | Admitting: Family Medicine

## 2020-06-29 ENCOUNTER — Encounter: Payer: Self-pay | Admitting: Family Medicine

## 2020-06-29 DIAGNOSIS — R195 Other fecal abnormalities: Secondary | ICD-10-CM

## 2020-06-29 NOTE — Telephone Encounter (Signed)
Dr. Zigmund Daniel: please send referral to GI. Pt does not have GI doctor.  Advised patient of Cologuard + (positive) result.   Next steps: referral to GI for possible colonoscopy.   Pt was concerned about being "borderline" diabetic and not eating all day. Advised patient that there were different processes for prep and diabetics had colonoscopies also.

## 2020-06-29 NOTE — Telephone Encounter (Signed)
GI referral entered

## 2020-06-30 ENCOUNTER — Encounter: Payer: Self-pay | Admitting: Family Medicine

## 2020-07-06 ENCOUNTER — Encounter: Payer: Self-pay | Admitting: Family Medicine

## 2020-07-08 ENCOUNTER — Other Ambulatory Visit: Payer: Self-pay | Admitting: Family Medicine

## 2020-07-08 DIAGNOSIS — E1169 Type 2 diabetes mellitus with other specified complication: Secondary | ICD-10-CM

## 2020-07-08 DIAGNOSIS — E782 Mixed hyperlipidemia: Secondary | ICD-10-CM

## 2020-07-08 NOTE — Telephone Encounter (Signed)
Orders entered for labs

## 2020-07-11 ENCOUNTER — Encounter: Payer: Self-pay | Admitting: Family Medicine

## 2020-07-19 ENCOUNTER — Encounter: Payer: Self-pay | Admitting: Family Medicine

## 2020-07-22 ENCOUNTER — Other Ambulatory Visit: Payer: Self-pay | Admitting: Family Medicine

## 2020-07-22 ENCOUNTER — Encounter: Payer: Self-pay | Admitting: Family Medicine

## 2020-07-27 ENCOUNTER — Encounter: Payer: Self-pay | Admitting: Family Medicine

## 2020-07-29 ENCOUNTER — Encounter: Payer: Self-pay | Admitting: Family Medicine

## 2020-07-30 ENCOUNTER — Encounter: Payer: Self-pay | Admitting: Family Medicine

## 2020-08-14 ENCOUNTER — Encounter: Payer: Self-pay | Admitting: Family Medicine

## 2020-08-18 ENCOUNTER — Ambulatory Visit: Payer: Medicare Other | Admitting: Gastroenterology

## 2020-08-20 ENCOUNTER — Encounter: Payer: Self-pay | Admitting: Family Medicine

## 2020-08-26 DIAGNOSIS — R768 Other specified abnormal immunological findings in serum: Secondary | ICD-10-CM | POA: Diagnosis not present

## 2020-08-26 DIAGNOSIS — L219 Seborrheic dermatitis, unspecified: Secondary | ICD-10-CM | POA: Diagnosis not present

## 2020-08-26 DIAGNOSIS — C50811 Malignant neoplasm of overlapping sites of right female breast: Secondary | ICD-10-CM | POA: Diagnosis not present

## 2020-08-26 DIAGNOSIS — Z9189 Other specified personal risk factors, not elsewhere classified: Secondary | ICD-10-CM | POA: Diagnosis not present

## 2020-08-26 DIAGNOSIS — R760 Raised antibody titer: Secondary | ICD-10-CM | POA: Diagnosis not present

## 2020-08-26 DIAGNOSIS — L300 Nummular dermatitis: Secondary | ICD-10-CM | POA: Diagnosis not present

## 2020-08-26 DIAGNOSIS — Z203 Contact with and (suspected) exposure to rabies: Secondary | ICD-10-CM | POA: Diagnosis not present

## 2020-08-26 DIAGNOSIS — Z170 Estrogen receptor positive status [ER+]: Secondary | ICD-10-CM | POA: Diagnosis not present

## 2020-08-26 DIAGNOSIS — L309 Dermatitis, unspecified: Secondary | ICD-10-CM | POA: Diagnosis not present

## 2020-08-26 DIAGNOSIS — I10 Essential (primary) hypertension: Secondary | ICD-10-CM | POA: Diagnosis not present

## 2020-08-27 ENCOUNTER — Encounter: Payer: Self-pay | Admitting: Family Medicine

## 2020-08-30 ENCOUNTER — Encounter: Payer: Self-pay | Admitting: Family Medicine

## 2020-08-30 ENCOUNTER — Ambulatory Visit (INDEPENDENT_AMBULATORY_CARE_PROVIDER_SITE_OTHER): Payer: Medicare Other | Admitting: Family Medicine

## 2020-08-30 ENCOUNTER — Other Ambulatory Visit: Payer: Self-pay

## 2020-08-30 DIAGNOSIS — F411 Generalized anxiety disorder: Secondary | ICD-10-CM | POA: Diagnosis not present

## 2020-08-30 DIAGNOSIS — R195 Other fecal abnormalities: Secondary | ICD-10-CM

## 2020-08-30 DIAGNOSIS — I10 Essential (primary) hypertension: Secondary | ICD-10-CM

## 2020-08-30 DIAGNOSIS — E1169 Type 2 diabetes mellitus with other specified complication: Secondary | ICD-10-CM | POA: Diagnosis not present

## 2020-08-30 DIAGNOSIS — E782 Mixed hyperlipidemia: Secondary | ICD-10-CM | POA: Diagnosis not present

## 2020-08-30 NOTE — Patient Instructions (Addendum)
I would recommend that you proceed with CT scan and colonoscopy.  Please have a1c and lipid panel completed.  See me again in about 3 months.

## 2020-08-31 ENCOUNTER — Encounter: Payer: Self-pay | Admitting: Family Medicine

## 2020-08-31 ENCOUNTER — Ambulatory Visit: Payer: Medicare Other | Admitting: Family Medicine

## 2020-08-31 DIAGNOSIS — E1169 Type 2 diabetes mellitus with other specified complication: Secondary | ICD-10-CM | POA: Diagnosis not present

## 2020-08-31 DIAGNOSIS — E782 Mixed hyperlipidemia: Secondary | ICD-10-CM | POA: Diagnosis not present

## 2020-09-01 ENCOUNTER — Encounter: Payer: Self-pay | Admitting: Family Medicine

## 2020-09-01 DIAGNOSIS — I10 Essential (primary) hypertension: Secondary | ICD-10-CM | POA: Diagnosis not present

## 2020-09-01 DIAGNOSIS — R7989 Other specified abnormal findings of blood chemistry: Secondary | ICD-10-CM | POA: Diagnosis not present

## 2020-09-01 DIAGNOSIS — R195 Other fecal abnormalities: Secondary | ICD-10-CM | POA: Diagnosis not present

## 2020-09-01 LAB — HEMOGLOBIN A1C
Hgb A1c MFr Bld: 6.5 % of total Hgb — ABNORMAL HIGH (ref <5.7)
Mean Plasma Glucose: 140 mg/dL
eAG (mmol/L): 7.7 mmol/L

## 2020-09-01 LAB — LIPID PANEL
Cholesterol: 176 mg/dL (ref <200)
HDL: 53 mg/dL (ref >=50)
LDL Cholesterol (Calc): 102 mg/dL (calc) — ABNORMAL HIGH
Non-HDL Cholesterol (Calc): 123 mg/dL (calc) (ref <130)
Total CHOL/HDL Ratio: 3.3 (calc) (ref <5.0)
Triglycerides: 117 mg/dL (ref <150)

## 2020-09-02 ENCOUNTER — Encounter: Payer: Self-pay | Admitting: Family Medicine

## 2020-09-03 DIAGNOSIS — Z170 Estrogen receptor positive status [ER+]: Secondary | ICD-10-CM | POA: Diagnosis not present

## 2020-09-03 DIAGNOSIS — Z9189 Other specified personal risk factors, not elsewhere classified: Secondary | ICD-10-CM | POA: Diagnosis not present

## 2020-09-03 DIAGNOSIS — C50919 Malignant neoplasm of unspecified site of unspecified female breast: Secondary | ICD-10-CM | POA: Diagnosis not present

## 2020-09-03 DIAGNOSIS — C7951 Secondary malignant neoplasm of bone: Secondary | ICD-10-CM | POA: Diagnosis not present

## 2020-09-03 DIAGNOSIS — C787 Secondary malignant neoplasm of liver and intrahepatic bile duct: Secondary | ICD-10-CM | POA: Diagnosis not present

## 2020-09-03 DIAGNOSIS — R7989 Other specified abnormal findings of blood chemistry: Secondary | ICD-10-CM | POA: Diagnosis not present

## 2020-09-03 DIAGNOSIS — C50811 Malignant neoplasm of overlapping sites of right female breast: Secondary | ICD-10-CM | POA: Diagnosis not present

## 2020-09-05 ENCOUNTER — Encounter: Payer: Self-pay | Admitting: Family Medicine

## 2020-09-05 DIAGNOSIS — R195 Other fecal abnormalities: Secondary | ICD-10-CM | POA: Insufficient documentation

## 2020-09-05 MED ORDER — LISINOPRIL 10 MG PO TABS
10.0000 mg | ORAL_TABLET | Freq: Two times a day (BID) | ORAL | 1 refills | Status: DC
Start: 2020-09-05 — End: 2021-04-18

## 2020-09-05 NOTE — Assessment & Plan Note (Signed)
Stable with citalopram, continue at current strength.

## 2020-09-05 NOTE — Assessment & Plan Note (Signed)
She has upcoming colonoscopy.  Recent labs with significant increase in LFT's as well.  Has upcoming CT abdomen for further evaluation.

## 2020-09-05 NOTE — Assessment & Plan Note (Signed)
Update a1c.   Continue to work on dietary changes.

## 2020-09-05 NOTE — Assessment & Plan Note (Signed)
She has declined statin.  Due for updated lipid panel, ordered.

## 2020-09-05 NOTE — Assessment & Plan Note (Signed)
BP has remained fairly well controlled with lisinopril.  Continue at current strength.  Recommend low sodium diet as well.

## 2020-09-05 NOTE — Progress Notes (Signed)
[name] - 65 y.o. female MRN [id number]  Date of birth: October 14, 1955  Subjective No chief complaint on file.   HPI [name] is a 65 y.o. female here today for follow up visit.   Has history of HTN, T2DM, HLD, breast cancer and anxiety.   She also had previous positive cologuard test.  She has been putting off having diagnostic colonoscopy but finally has this scheduled for next week.   History of fatty liver disease however significant elevation in LFT's on recent labs.  Oncologist has ordered CT scan for further evaluation.   Diabetes has been pretty well controlled.  She has had some problems sticking to healthy diet.  She doesn't exercise very frequently.  She has declined statins for treatment of associated HLD.    HTN managed with lisinopril 10mg  daily.  She denies side effects related to this.  She has not had chest pain, shortness of breath, palpitations, headache or vision changes.    Taking citalopram daily for management of anxiety.  Feels like she is doing ok at current strength.   ROS:  A comprehensive ROS was completed and negative except as noted per HPI  Allergies  Allergen Reactions  . Penicillins Shortness Of Breath    Reaction as a child Did it involve swelling of the face/tongue/throat, SOB, or low BP? Yes Did it involve sudden or severe rash/hives, skin peeling, or any reaction on the inside of your mouth or nose?Unknown Did you need to seek medical attention at a hospital or doctor's office? Yes When did it last happen? Childhood reaction. If all above answers are "NO", may proceed with cephalosporin use.   . Sulfa Antibiotics Nausea Only  . Effexor [Venlafaxine] Other (See Comments)    "dopey"    Past Medical History:  Diagnosis Date  . Anxiety   . Asthma   . Breast cancer ([place])   . Chest pain, atypical   . Closed head injury 06/06/2017  . Depression   . Heart palpitations    hx of MVP  . Hypertension   . IBS (irritable bowel syndrome)    . Pre-diabetes     Past Surgical History:  Procedure Laterality Date  . BREAST SURGERY  06/2018   right and left breast  . eAB     x2   . MASTECTOMY MODIFIED RADICAL Bilateral 10/10/2018   Procedure: RIGHT MODIFIED RADICAL MASTECTOMY AND LEFT PROPHYLATIC MASTECTOMY;  Surgeon: [name], MD;  Location: [facility];  Service: General;  Laterality: Bilateral;  . PORTACATH PLACEMENT Left 08/12/2018   Procedure: INSERTION PORT-A-CATH;  Surgeon: [name], MD;  Location: [facility];  Service: General;  Laterality: Left;    Social History   Socioeconomic History  . Marital status: Divorced    Spouse name: Not on file  . Number of children: Not on file  . Years of education: Not on file  . Highest education level: Not on file  Occupational History  . Not on file  Tobacco Use  . Smoking status: Never Smoker  . Smokeless tobacco: Never Used  . Tobacco comment: nonsmoker  Vaping Use  . Vaping Use: Never used  Substance and Sexual Activity  . Alcohol use: No  . Drug use: No  . Sexual activity: Not Currently    Birth control/protection: None  Other Topics Concern  . Not on file  Social History Narrative   Divorced; grown son lives in [place].    [facility]- PCP  Social Determinants of Health   Financial Resource Strain: Not on file  Food Insecurity: Not on file  Transportation Needs: Not on file  Physical Activity: Not on file  Stress: Not on file  Social Connections: Not on file    Family History  Problem Relation Age of Onset  . Heart attack Father   . Lung cancer Mother   . Bladder Cancer Brother     Health Maintenance  Topic Date Due  . FOOT EXAM  Never done  . OPHTHALMOLOGY EXAM  Never done  . INFLUENZA VACCINE  10/28/2020 (Originally 02/29/2020)  . DEXA SCAN  11/23/2020 (Originally 11/23/2019)  . PNA vac Low Risk Adult (1 of 2 - PCV13) 11/23/2020 (Originally 11/23/2019)  . COLONOSCOPY (Pts 45-45yrs Insurance coverage will need to be  confirmed)  12/31/2020 (Originally 11/23/1999)  . HEMOGLOBIN A1C  02/28/2021  . PAP SMEAR-Modifier  01/26/2022  . TETANUS/TDAP  03/28/2028  . Hepatitis C Screening  Completed  . HIV Screening  Completed  . MAMMOGRAM  Discontinued  . COVID-19 Vaccine  Discontinued     ----------------------------------------------------------------------------------------------------------------------------------------------------------------------------------------------------------------- Physical Exam BP 140/67 (BP Location: Left Arm, Patient Position: Sitting, Cuff Size: Normal)   Pulse 76   Temp (!) 97.5 F (36.4 C)   Wt 180 lb 11.2 oz (82 kg)   SpO2 100%   BMI 30.54 kg/m   Physical Exam Constitutional:      Appearance: Normal appearance.  Eyes:     General: No scleral icterus. Cardiovascular:     Rate and Rhythm: Normal rate and regular rhythm.  Pulmonary:     Effort: Pulmonary effort is normal.     Breath sounds: Normal breath sounds.  Musculoskeletal:     Cervical back: Neck supple.  Skin:    General: Skin is warm and dry.  Neurological:     General: No focal deficit present.     Mental Status: She is alert.  Psychiatric:        Mood and Affect: Mood normal.        Behavior: Behavior normal.     ------------------------------------------------------------------------------------------------------------------------------------------------------------------------------------------------------------------- Assessment and Plan  Essential hypertension BP has remained fairly well controlled with lisinopril.  Continue at current strength.  Recommend low sodium diet as well.    T2DM (type 2 diabetes mellitus) ([place]) Update a1c.   Continue to work on dietary changes.  GAD (generalized anxiety disorder) Stable with citalopram, continue at current strength.    Hyperlipidemia She has declined statin.  Due for updated lipid panel, ordered.    Positive colorectal cancer screening  using Cologuard test She has upcoming colonoscopy.  Recent labs with significant increase in LFT's as well.  Has upcoming CT abdomen for further evaluation.     Meds ordered this encounter  Medications  . lisinopril (ZESTRIL) 10 MG tablet    Sig: Take 1 tablet (10 mg total) by mouth in the morning and at bedtime.    Dispense:  180 tablet    Refill:  1   .  Return in about 3 months (around 11/27/2020) for HTN/DM.    This visit occurred during the SARS-CoV-2 public health emergency.  Safety protocols were in place, including screening questions prior to the visit, additional usage of staff PPE, and extensive cleaning of exam room while observing appropriate contact time as indicated for disinfecting solutions.

## 2020-09-06 ENCOUNTER — Encounter: Payer: Self-pay | Admitting: Family Medicine

## 2020-09-06 ENCOUNTER — Ambulatory Visit: Payer: Medicare Other | Admitting: Obstetrics & Gynecology

## 2020-09-06 ENCOUNTER — Telehealth (INDEPENDENT_AMBULATORY_CARE_PROVIDER_SITE_OTHER): Payer: Medicare Other | Admitting: Family Medicine

## 2020-09-06 DIAGNOSIS — R5383 Other fatigue: Secondary | ICD-10-CM | POA: Diagnosis not present

## 2020-09-06 DIAGNOSIS — R7989 Other specified abnormal findings of blood chemistry: Secondary | ICD-10-CM | POA: Diagnosis not present

## 2020-09-06 DIAGNOSIS — R195 Other fecal abnormalities: Secondary | ICD-10-CM | POA: Diagnosis not present

## 2020-09-06 NOTE — Assessment & Plan Note (Signed)
Has upcoming colonoscopy

## 2020-09-06 NOTE — Assessment & Plan Note (Signed)
Elevated LFT's on recent labs.  Follow up CT shows evidence of bony metastases and liver metastases.  History of breast cancer also with positive cologuard.  Has f/u with oncology on Thursday.

## 2020-09-06 NOTE — Assessment & Plan Note (Signed)
Possibly related to poor PO intake over the past couple of days.  Feeling better after eating today.  She will let me know if symptoms return and we'll check BMP.

## 2020-09-06 NOTE — Progress Notes (Signed)
[name] - 65 y.o. female MRN [id number]  Date of birth: 11/19/1955   This visit type was conducted due to national recommendations for restrictions regarding the COVID-19 Pandemic (e.g. social distancing).  This format is felt to be most appropriate for this patient at this time.  All issues noted in this document were discussed and addressed.  No physical exam was performed (except for noted visual exam findings with Video Visits).  I discussed the limitations of evaluation and management by telemedicine and the availability of in person appointments. The patient expressed understanding and agreed to proceed.  I connected with@ on 09/06/20 at  4:20 PM EST by a video enabled telemedicine application and verified that I am speaking with the correct person using two identifiers.  Interactive audio and video telecommunications were attempted between this provider and patient, however failed, due to patient having technical difficulties OR patient did not have access to video capability.  We continued and completed visit with audio only.    Present at visit: Luetta Nutting, DO Janalee Dane   Patient Location: Home [place] [id number]   Provider location:   [facility]  Chief Complaint  Patient presents with  . Fatigue  . Weakness    HPI  [name] is a 65 y.o. female who presents via audio/video conferencing for a telehealth visit today.  She has complaint of increased fatigue/weakness.  She thinks that [name] potassium has been low.  States that symptoms started after taking barium contrast for CT.  Worsened over the weekend.  States that she didn't really eat for a couple of days after reading results of CT.  She has started eating again and feels better today.  She denies cramps, nausea, vomiting, diarrhea.    ROS:  A comprehensive ROS was completed and negative except as noted per HPI  Past Medical History:  Diagnosis Date  . Anxiety   . Asthma   . Breast  cancer ([place])   . Chest pain, atypical   . Closed head injury 06/06/2017  . Depression   . Heart palpitations    hx of MVP  . Hypertension   . IBS (irritable bowel syndrome)   . Pre-diabetes     Past Surgical History:  Procedure Laterality Date  . BREAST SURGERY  06/2018   right and left breast  . eAB     x2   . MASTECTOMY MODIFIED RADICAL Bilateral 10/10/2018   Procedure: RIGHT MODIFIED RADICAL MASTECTOMY AND LEFT PROPHYLATIC MASTECTOMY;  Surgeon: [name], MD;  [facility]: [facility];  Service: General;  Laterality: Bilateral;  . PORTACATH PLACEMENT Left 08/12/2018   Procedure: INSERTION PORT-A-CATH;  Surgeon: [name], MD;  Location: [facility];  Service: General;  Laterality: Left;    Family History  Problem Relation Age of Onset  . Heart attack Father   . Lung cancer Mother   . Bladder Cancer Brother     Social History   Socioeconomic History  . Marital status: Divorced    Spouse name: Not on file  . Number of children: Not on file  . Years of education: Not on file  . Highest education level: Not on file  Occupational History  . Not on file  Tobacco Use  . Smoking status: Never Smoker  . Smokeless tobacco: Never Used  . Tobacco comment: nonsmoker  Vaping Use  . Vaping Use: Never used  Substance and Sexual Activity  . Alcohol use: No  .  Drug use: No  . Sexual activity: Not Currently    Birth control/protection: None  Other Topics Concern  . Not on file  Social History Narrative   Divorced; grown son lives in [place].    [facility]- PCP   Social Determinants of Health   Financial Resource Strain: Not on file  Food Insecurity: Not on file  Transportation Needs: Not on file  Physical Activity: Not on file  Stress: Not on file  Social Connections: Not on file  Intimate Partner Violence: Not on file     Current Outpatient Medications:  .  albuterol (VENTOLIN HFA) 108 (90 Base) MCG/ACT inhaler, Inhale 2 puffs into the lungs  every 6 (six) hours as needed for wheezing or shortness of breath., Disp: 6.7 g, Rfl: 0 .  aspirin EC 81 MG tablet, Take 81 mg by mouth 4 (four) times a week., Disp: , Rfl:  .  B Complex-C (B-COMPLEX WITH VITAMIN C) tablet, Take 1 tablet by mouth daily after lunch., Disp: , Rfl:  .  Calcium-Magnesium (CAL-MAG PO), Take 1 tablet by mouth 2 (two) times daily., Disp: , Rfl:  .  CHLOROPHYLL PO, Take 1 tablet by mouth every other day., Disp: , Rfl:  .  citalopram (CELEXA) 10 MG tablet, TAKE 1 TABLET BY MOUTH EVERY DAY, Disp: 90 tablet, Rfl: 2 .  clotrimazole-betamethasone (LOTRISONE) cream, Apply topically 2 (two) times daily., Disp: , Rfl:  .  Garlic Oil 3762 MG CAPS, Take 1 capsule by mouth daily., Disp: , Rfl:  .  Lactobacillus (ACIDOPHILUS PO), Take 1 capsule by mouth daily., Disp: , Rfl:  .  letrozole (FEMARA) 2.5 MG tablet, Take 2.5 mg by mouth daily., Disp: , Rfl:  .  lisinopril (ZESTRIL) 10 MG tablet, Take 1 tablet (10 mg total) by mouth in the morning and at bedtime., Disp: 180 tablet, Rfl: 1 .  loratadine (CLARITIN) 10 MG tablet, Take 1 tablet (10 mg total) by mouth daily. (Patient taking differently: Take 10 mg by mouth daily as needed for allergies.), Disp: 90 tablet, Rfl: 3 .  Misc Natural Products (TURMERIC CURCUMIN) CAPS, Take 1 capsule by mouth daily after lunch., Disp: , Rfl:  .  mupirocin ointment (BACTROBAN) 2 %, Apply to wound 3 times daily for 5 days, Disp: 30 g, Rfl: 0 .  Omega-3 Fatty Acids (FISH OIL) 1000 MG CAPS, Take 1 capsule by mouth daily. Ultimate Omega, Disp: , Rfl:  .  triamcinolone cream (KENALOG) 0.5 %, Apply 1 application topically 2 (two) times daily. To affected areas., Disp: 454 g, Rfl: 3 .  Vitamin D-Vitamin K (D3 + K2 DOTS PO), Take 1 tablet by mouth daily., Disp: , Rfl:   EXAM:  VITALS per patient if applicable: BP 831/51   Temp 98.6 F (37 C)   GENERAL: alert, oriented,  no acute distress  PSYCH/NEURO: pleasant and cooperative, no obvious depression or  anxiety, speech and thought processing grossly intact  ASSESSMENT AND PLAN:  Discussed the following assessment and plan:  Positive colorectal cancer screening using Cologuard test Has upcoming colonoscopy  Elevated LFTs Elevated LFT's on recent labs.  Follow up CT shows evidence of bony metastases and liver metastases.  History of breast cancer also with positive cologuard.  Has f/u with oncology on Thursday.    Fatigue Possibly related to poor PO intake over the past couple of days.  Feeling better after eating today.  She will let me know if symptoms return and we'll check BMP.  21 minutes spent including pre visit preparation, review of prior notes and labs, encounter with patient via telephone visit and same day documentation.  I discussed the assessment and treatment plan with the patient. The patient was provided an opportunity to ask questions and all were answered. The patient agreed with the plan and demonstrated an understanding of the instructions.   The patient was advised to call back or seek an in-person evaluation if the symptoms worsen or if the condition fails to improve as anticipated.    Luetta Nutting, DO

## 2020-09-07 ENCOUNTER — Encounter: Payer: Self-pay | Admitting: Family Medicine

## 2020-09-08 ENCOUNTER — Encounter: Payer: Self-pay | Admitting: Family Medicine

## 2020-09-09 DIAGNOSIS — C50811 Malignant neoplasm of overlapping sites of right female breast: Secondary | ICD-10-CM | POA: Diagnosis not present

## 2020-09-10 ENCOUNTER — Encounter: Payer: Self-pay | Admitting: Family Medicine

## 2020-09-13 ENCOUNTER — Telehealth: Payer: Self-pay | Admitting: General Practice

## 2020-09-15 ENCOUNTER — Encounter: Payer: Self-pay | Admitting: Family Medicine

## 2020-09-16 DIAGNOSIS — C787 Secondary malignant neoplasm of liver and intrahepatic bile duct: Secondary | ICD-10-CM | POA: Diagnosis not present

## 2020-09-16 DIAGNOSIS — Z170 Estrogen receptor positive status [ER+]: Secondary | ICD-10-CM | POA: Diagnosis not present

## 2020-09-16 DIAGNOSIS — R7989 Other specified abnormal findings of blood chemistry: Secondary | ICD-10-CM | POA: Diagnosis not present

## 2020-09-16 DIAGNOSIS — C50919 Malignant neoplasm of unspecified site of unspecified female breast: Secondary | ICD-10-CM | POA: Diagnosis not present

## 2020-09-16 DIAGNOSIS — C7951 Secondary malignant neoplasm of bone: Secondary | ICD-10-CM | POA: Diagnosis not present

## 2020-09-16 DIAGNOSIS — C50811 Malignant neoplasm of overlapping sites of right female breast: Secondary | ICD-10-CM | POA: Diagnosis not present

## 2020-09-16 DIAGNOSIS — R918 Other nonspecific abnormal finding of lung field: Secondary | ICD-10-CM | POA: Diagnosis not present

## 2020-09-17 NOTE — Telephone Encounter (Signed)
Documentation only.

## 2020-09-20 DIAGNOSIS — Z882 Allergy status to sulfonamides status: Secondary | ICD-10-CM | POA: Diagnosis not present

## 2020-09-20 DIAGNOSIS — E785 Hyperlipidemia, unspecified: Secondary | ICD-10-CM | POA: Diagnosis not present

## 2020-09-20 DIAGNOSIS — R160 Hepatomegaly, not elsewhere classified: Secondary | ICD-10-CM | POA: Diagnosis not present

## 2020-09-20 DIAGNOSIS — C50919 Malignant neoplasm of unspecified site of unspecified female breast: Secondary | ICD-10-CM | POA: Diagnosis not present

## 2020-09-20 DIAGNOSIS — Z7982 Long term (current) use of aspirin: Secondary | ICD-10-CM | POA: Diagnosis not present

## 2020-09-20 DIAGNOSIS — Z9189 Other specified personal risk factors, not elsewhere classified: Secondary | ICD-10-CM | POA: Diagnosis not present

## 2020-09-20 DIAGNOSIS — Z803 Family history of malignant neoplasm of breast: Secondary | ICD-10-CM | POA: Diagnosis not present

## 2020-09-20 DIAGNOSIS — C787 Secondary malignant neoplasm of liver and intrahepatic bile duct: Secondary | ICD-10-CM | POA: Diagnosis not present

## 2020-09-20 DIAGNOSIS — I10 Essential (primary) hypertension: Secondary | ICD-10-CM | POA: Diagnosis not present

## 2020-09-20 DIAGNOSIS — J45909 Unspecified asthma, uncomplicated: Secondary | ICD-10-CM | POA: Diagnosis not present

## 2020-09-20 DIAGNOSIS — K589 Irritable bowel syndrome without diarrhea: Secondary | ICD-10-CM | POA: Diagnosis not present

## 2020-09-20 DIAGNOSIS — C50811 Malignant neoplasm of overlapping sites of right female breast: Secondary | ICD-10-CM | POA: Diagnosis not present

## 2020-09-20 DIAGNOSIS — Z880 Allergy status to penicillin: Secondary | ICD-10-CM | POA: Diagnosis not present

## 2020-09-20 DIAGNOSIS — R7989 Other specified abnormal findings of blood chemistry: Secondary | ICD-10-CM | POA: Diagnosis not present

## 2020-09-20 DIAGNOSIS — R634 Abnormal weight loss: Secondary | ICD-10-CM | POA: Diagnosis not present

## 2020-09-20 DIAGNOSIS — K769 Liver disease, unspecified: Secondary | ICD-10-CM | POA: Diagnosis not present

## 2020-09-20 DIAGNOSIS — Z170 Estrogen receptor positive status [ER+]: Secondary | ICD-10-CM | POA: Diagnosis not present

## 2020-09-20 DIAGNOSIS — R6881 Early satiety: Secondary | ICD-10-CM | POA: Diagnosis not present

## 2020-09-20 DIAGNOSIS — M899 Disorder of bone, unspecified: Secondary | ICD-10-CM | POA: Diagnosis not present

## 2020-09-20 DIAGNOSIS — Z79899 Other long term (current) drug therapy: Secondary | ICD-10-CM | POA: Diagnosis not present

## 2020-09-20 DIAGNOSIS — Z888 Allergy status to other drugs, medicaments and biological substances status: Secondary | ICD-10-CM | POA: Diagnosis not present

## 2020-09-22 ENCOUNTER — Encounter: Payer: Self-pay | Admitting: Family Medicine

## 2020-09-23 ENCOUNTER — Encounter: Payer: Self-pay | Admitting: Family Medicine

## 2020-09-24 ENCOUNTER — Encounter: Payer: Self-pay | Admitting: Family Medicine

## 2020-09-24 DIAGNOSIS — C50811 Malignant neoplasm of overlapping sites of right female breast: Secondary | ICD-10-CM | POA: Diagnosis not present

## 2020-09-24 DIAGNOSIS — Z170 Estrogen receptor positive status [ER+]: Secondary | ICD-10-CM | POA: Diagnosis not present

## 2020-09-26 DIAGNOSIS — C50811 Malignant neoplasm of overlapping sites of right female breast: Secondary | ICD-10-CM | POA: Diagnosis not present

## 2020-09-26 DIAGNOSIS — Z170 Estrogen receptor positive status [ER+]: Secondary | ICD-10-CM | POA: Diagnosis not present

## 2020-09-27 ENCOUNTER — Encounter: Payer: Self-pay | Admitting: Family Medicine

## 2020-09-28 ENCOUNTER — Encounter: Payer: Self-pay | Admitting: Family Medicine

## 2020-09-28 ENCOUNTER — Other Ambulatory Visit: Payer: Self-pay | Admitting: Family Medicine

## 2020-09-28 MED ORDER — ONDANSETRON 8 MG PO TBDP: 8.0000 mg | ORAL_TABLET | Freq: Three times a day (TID) | ORAL | 0 refills | Status: DC | PRN

## 2020-09-28 NOTE — Progress Notes (Signed)
zofra

## 2020-09-30 DIAGNOSIS — C50811 Malignant neoplasm of overlapping sites of right female breast: Secondary | ICD-10-CM | POA: Diagnosis not present

## 2020-09-30 DIAGNOSIS — Z170 Estrogen receptor positive status [ER+]: Secondary | ICD-10-CM | POA: Diagnosis not present

## 2020-09-30 DIAGNOSIS — C50911 Malignant neoplasm of unspecified site of right female breast: Secondary | ICD-10-CM | POA: Diagnosis not present

## 2020-10-01 ENCOUNTER — Encounter: Payer: Self-pay | Admitting: Family Medicine

## 2020-10-04 DIAGNOSIS — Z170 Estrogen receptor positive status [ER+]: Secondary | ICD-10-CM | POA: Diagnosis not present

## 2020-10-04 DIAGNOSIS — C50811 Malignant neoplasm of overlapping sites of right female breast: Secondary | ICD-10-CM | POA: Diagnosis not present

## 2020-10-08 ENCOUNTER — Encounter: Payer: Self-pay | Admitting: Family Medicine

## 2020-10-08 ENCOUNTER — Telehealth: Payer: Self-pay

## 2020-10-08 DIAGNOSIS — K589 Irritable bowel syndrome without diarrhea: Secondary | ICD-10-CM | POA: Diagnosis not present

## 2020-10-08 DIAGNOSIS — Z79899 Other long term (current) drug therapy: Secondary | ICD-10-CM | POA: Diagnosis not present

## 2020-10-08 DIAGNOSIS — Z882 Allergy status to sulfonamides status: Secondary | ICD-10-CM | POA: Diagnosis not present

## 2020-10-08 DIAGNOSIS — Z7951 Long term (current) use of inhaled steroids: Secondary | ICD-10-CM | POA: Diagnosis not present

## 2020-10-08 DIAGNOSIS — K5900 Constipation, unspecified: Secondary | ICD-10-CM | POA: Diagnosis not present

## 2020-10-08 DIAGNOSIS — J45909 Unspecified asthma, uncomplicated: Secondary | ICD-10-CM | POA: Diagnosis not present

## 2020-10-08 DIAGNOSIS — Z880 Allergy status to penicillin: Secondary | ICD-10-CM | POA: Diagnosis not present

## 2020-10-08 DIAGNOSIS — R109 Unspecified abdominal pain: Secondary | ICD-10-CM | POA: Diagnosis not present

## 2020-10-08 DIAGNOSIS — Z7982 Long term (current) use of aspirin: Secondary | ICD-10-CM | POA: Diagnosis not present

## 2020-10-08 DIAGNOSIS — Z888 Allergy status to other drugs, medicaments and biological substances status: Secondary | ICD-10-CM | POA: Diagnosis not present

## 2020-10-08 NOTE — Telephone Encounter (Signed)
Pt lvm concerning constipation and taking Senokot nightly.   Returned call. No answer. LVM advising senokot nightly is fine, per Dr. Zigmund Daniel. Suggested adding fluids and fiber to her diet to help with constipation.

## 2020-10-08 NOTE — Telephone Encounter (Signed)
Left message for a return call

## 2020-10-10 DIAGNOSIS — C7951 Secondary malignant neoplasm of bone: Secondary | ICD-10-CM | POA: Insufficient documentation

## 2020-10-11 DIAGNOSIS — K5900 Constipation, unspecified: Secondary | ICD-10-CM | POA: Diagnosis not present

## 2020-10-11 DIAGNOSIS — C787 Secondary malignant neoplasm of liver and intrahepatic bile duct: Secondary | ICD-10-CM | POA: Insufficient documentation

## 2020-10-11 DIAGNOSIS — C50811 Malignant neoplasm of overlapping sites of right female breast: Secondary | ICD-10-CM | POA: Diagnosis not present

## 2020-10-11 DIAGNOSIS — Z5111 Encounter for antineoplastic chemotherapy: Secondary | ICD-10-CM | POA: Diagnosis not present

## 2020-10-11 DIAGNOSIS — Z9189 Other specified personal risk factors, not elsewhere classified: Secondary | ICD-10-CM | POA: Diagnosis not present

## 2020-10-11 DIAGNOSIS — Z170 Estrogen receptor positive status [ER+]: Secondary | ICD-10-CM | POA: Diagnosis not present

## 2020-10-11 DIAGNOSIS — K769 Liver disease, unspecified: Secondary | ICD-10-CM | POA: Diagnosis not present

## 2020-10-11 DIAGNOSIS — C7951 Secondary malignant neoplasm of bone: Secondary | ICD-10-CM | POA: Diagnosis not present

## 2020-10-11 DIAGNOSIS — I10 Essential (primary) hypertension: Secondary | ICD-10-CM | POA: Diagnosis not present

## 2020-10-11 DIAGNOSIS — K7689 Other specified diseases of liver: Secondary | ICD-10-CM | POA: Diagnosis not present

## 2020-10-14 ENCOUNTER — Encounter: Payer: Self-pay | Admitting: Family Medicine

## 2020-10-14 ENCOUNTER — Ambulatory Visit (INDEPENDENT_AMBULATORY_CARE_PROVIDER_SITE_OTHER): Payer: Medicare Other | Admitting: Family Medicine

## 2020-10-14 ENCOUNTER — Other Ambulatory Visit: Payer: Self-pay

## 2020-10-14 DIAGNOSIS — R195 Other fecal abnormalities: Secondary | ICD-10-CM | POA: Diagnosis not present

## 2020-10-14 DIAGNOSIS — L2082 Flexural eczema: Secondary | ICD-10-CM

## 2020-10-14 DIAGNOSIS — C50919 Malignant neoplasm of unspecified site of unspecified female breast: Secondary | ICD-10-CM

## 2020-10-14 MED ORDER — CLOBETASOL PROPIONATE 0.05 % EX CREA: 1.0000 "application " | TOPICAL_CREAM | Freq: Two times a day (BID) | CUTANEOUS | 1 refills | Status: DC

## 2020-10-14 NOTE — Patient Instructions (Signed)
Try clobetasol cream to rash Hope your treatments go well!

## 2020-10-15 ENCOUNTER — Other Ambulatory Visit: Payer: Self-pay | Admitting: Family Medicine

## 2020-10-15 ENCOUNTER — Encounter: Payer: Self-pay | Admitting: Family Medicine

## 2020-10-15 MED ORDER — BUDESONIDE-FORMOTEROL FUMARATE 160-4.5 MCG/ACT IN AERO: 2.0000 | INHALATION_SPRAY | Freq: Two times a day (BID) | RESPIRATORY_TRACT | 2 refills | Status: DC

## 2020-10-17 DIAGNOSIS — C50919 Malignant neoplasm of unspecified site of unspecified female breast: Secondary | ICD-10-CM | POA: Insufficient documentation

## 2020-10-17 NOTE — Assessment & Plan Note (Signed)
Start clobetasol cream bid as needed.

## 2020-10-17 NOTE — Assessment & Plan Note (Signed)
Given recent diagnosis of stage IV breast cancer I think work up for this with colonoscopy can wait at this time.

## 2020-10-17 NOTE — Progress Notes (Signed)
[name] - 65 y.o. female MRN [id number]  Date of birth: January 28, 1956  Subjective No chief complaint on file.   HPI [name] is a 65 y.o. female here today with complaint of rash and to discussed recent diagnosis of Stage IV breast cancer.    Recently had scan showing metastases of breast cancer to bone and liver.   Seen at [facility] and trial discussed however she did not want to make the drive between here and [facility] frequently.  She plans on having treatment with [place].  She had declined Taxane due to hair loss associated with this.  She is unsure if she take additional chemo therapy depending on response to [place].   She has rash located on forearms and hip area.  Rash is itchy.  No changes to anything recently.  She has tried moisturizer to these areas with some improvement.   ROS:  A comprehensive ROS was completed and negative except as noted per HPI    Allergies  Allergen Reactions  . Penicillins Shortness Of Breath    Reaction as a child Did it involve swelling of the face/tongue/throat, SOB, or low BP? Yes Did it involve sudden or severe rash/hives, skin peeling, or any reaction on the inside of your mouth or nose?Unknown Did you need to seek medical attention at a hospital or doctor's office? Yes When did it last happen? Childhood reaction. If all above answers are "NO", may proceed with cephalosporin use.   . Sulfa Antibiotics Nausea Only  . Effexor [Venlafaxine] Other (See Comments)    "dopey"    Past Medical History:  Diagnosis Date  . Anxiety   . Asthma   . Breast cancer ([place])   . Chest pain, atypical   . Closed head injury 06/06/2017  . Depression   . Heart palpitations    hx of MVP  . Hypertension   . IBS (irritable bowel syndrome)   . Pre-diabetes     Past Surgical History:  Procedure Laterality Date  . BREAST SURGERY  06/2018   right and left breast  . eAB     x2   . MASTECTOMY MODIFIED RADICAL Bilateral 10/10/2018   Procedure: RIGHT  MODIFIED RADICAL MASTECTOMY AND LEFT PROPHYLATIC MASTECTOMY;  Surgeon: [name], MD;  [facility];  Service: General;  Laterality: Bilateral;  . PORTACATH PLACEMENT Left 08/12/2018   Procedure: INSERTION PORT-A-CATH;  Surgeon: [name], MD;  Location: [facility];  Service: General;  Laterality: Left;    Social History   Socioeconomic History  . Marital status: Divorced    Spouse name: Not on file  . Number of children: Not on file  . Years of education: Not on file  . Highest education level: Not on file  Occupational History  . Not on file  Tobacco Use  . Smoking status: Never Smoker  . Smokeless tobacco: Never Used  . Tobacco comment: nonsmoker  Vaping Use  . Vaping Use: Never used  Substance and Sexual Activity  . Alcohol use: No  . Drug use: No  . Sexual activity: Not Currently    Birth control/protection: None  Other Topics Concern  . Not on file  Social History Narrative   Divorced; grown son lives in [place].    [facility]- PCP   Social Determinants of Health   Financial Resource Strain: Not on file  Food Insecurity: Not on file  Transportation Needs: Not on file  Physical Activity: Not on file  Stress: Not on file  Social Connections: Not on file    Family History  Problem Relation Age of Onset  . Heart attack Father   . Lung cancer Mother   . Bladder Cancer Brother     Health Maintenance  Topic Date Due  . FOOT EXAM  Never done  . INFLUENZA VACCINE  10/28/2020 (Originally 02/29/2020)  . DEXA SCAN  11/23/2020 (Originally 11/23/2019)  . PNA vac Low Risk Adult (1 of 2 - PCV13) 11/23/2020 (Originally 11/23/2019)  . COLONOSCOPY (Pts 45-53yrs Insurance coverage will need to be confirmed)  12/31/2020 (Originally 11/23/1999)  . OPHTHALMOLOGY EXAM  11/03/2020  . HEMOGLOBIN A1C  02/28/2021  . PAP SMEAR-Modifier  01/26/2022  . TETANUS/TDAP  03/28/2028  . Hepatitis C Screening  Completed  . HIV Screening  Completed  . HPV  VACCINES  Aged Out  . MAMMOGRAM  Discontinued  . COVID-19 Vaccine  Discontinued     ----------------------------------------------------------------------------------------------------------------------------------------------------------------------------------------------------------------- Physical Exam BP (!) 134/55 (BP Location: Left Arm, Patient Position: Sitting, Cuff Size: Normal)   Pulse 87   Wt 170 lb 8 oz (77.3 kg)   SpO2 98%   BMI 28.81 kg/m   Physical Exam Constitutional:      Appearance: Normal appearance.  HENT:     Head: Normocephalic and atraumatic.  Eyes:     General: No scleral icterus. Cardiovascular:     Rate and Rhythm: Normal rate and regular rhythm.  Musculoskeletal:     Cervical back: Neck supple.  Skin:    General: Skin is warm and dry.     Comments: Rash is dry, mildly erythematous and scaly looking  Neurological:     General: No focal deficit present.     Mental Status: She is alert.  Psychiatric:        Mood and Affect: Mood normal.        Behavior: Behavior normal.     ------------------------------------------------------------------------------------------------------------------------------------------------------------------------------------------------------------------- Assessment and Plan  Stage IV breast cancer in female [facility]) Stage IV with metastases to liver and bone.  She will undergo treatment with [name] and consider additional options based on response.    Positive colorectal cancer screening using Cologuard test Given recent diagnosis of stage IV breast cancer I think work up for this with colonoscopy can wait at this time.   Eczematous dermatitis Start clobetasol cream bid as needed.     Meds ordered this encounter  Medications  . clobetasol cream (TEMOVATE) 0.05 %    Sig: Apply 1 application topically 2 (two) times daily.    Dispense:  60 g    Refill:  1    No follow-ups on file.    This visit occurred  during the SARS-CoV-2 public health emergency.  Safety protocols were in place, including screening questions prior to the visit, additional usage of staff PPE, and extensive cleaning of exam room while observing appropriate contact time as indicated for disinfecting solutions.

## 2020-10-17 NOTE — Assessment & Plan Note (Signed)
Stage IV with metastases to liver and bone.  She will undergo treatment with [name] and consider additional options based on response.

## 2020-10-18 ENCOUNTER — Encounter: Payer: Self-pay | Admitting: Family Medicine

## 2020-10-19 DIAGNOSIS — C787 Secondary malignant neoplasm of liver and intrahepatic bile duct: Secondary | ICD-10-CM | POA: Diagnosis not present

## 2020-10-19 DIAGNOSIS — C50811 Malignant neoplasm of overlapping sites of right female breast: Secondary | ICD-10-CM | POA: Diagnosis not present

## 2020-10-19 DIAGNOSIS — C7951 Secondary malignant neoplasm of bone: Secondary | ICD-10-CM | POA: Diagnosis not present

## 2020-10-19 DIAGNOSIS — R768 Other specified abnormal immunological findings in serum: Secondary | ICD-10-CM | POA: Diagnosis not present

## 2020-10-19 DIAGNOSIS — R7989 Other specified abnormal findings of blood chemistry: Secondary | ICD-10-CM | POA: Diagnosis not present

## 2020-10-19 DIAGNOSIS — Z170 Estrogen receptor positive status [ER+]: Secondary | ICD-10-CM | POA: Diagnosis not present

## 2020-10-20 DIAGNOSIS — C50811 Malignant neoplasm of overlapping sites of right female breast: Secondary | ICD-10-CM | POA: Diagnosis not present

## 2020-10-20 DIAGNOSIS — Z5112 Encounter for antineoplastic immunotherapy: Secondary | ICD-10-CM | POA: Diagnosis not present

## 2020-10-20 DIAGNOSIS — Z170 Estrogen receptor positive status [ER+]: Secondary | ICD-10-CM | POA: Diagnosis not present

## 2020-10-20 DIAGNOSIS — C7951 Secondary malignant neoplasm of bone: Secondary | ICD-10-CM | POA: Diagnosis not present

## 2020-10-20 DIAGNOSIS — I10 Essential (primary) hypertension: Secondary | ICD-10-CM | POA: Diagnosis not present

## 2020-10-20 DIAGNOSIS — C787 Secondary malignant neoplasm of liver and intrahepatic bile duct: Secondary | ICD-10-CM | POA: Diagnosis not present

## 2020-10-21 ENCOUNTER — Encounter: Payer: Self-pay | Admitting: Family Medicine

## 2020-10-21 ENCOUNTER — Telehealth: Payer: Self-pay

## 2020-10-21 NOTE — Telephone Encounter (Signed)
Handled through telephone message.

## 2020-10-21 NOTE — Telephone Encounter (Signed)
[name] called all in a panic stating her non-fasting blood glucose was 208 mg/dl. She states she blood glucose has never been that high. She did however, have a treatment yesterday with steroids. I did advise as long as she has steroid treatments her blood glucose will go up. Also advised to check her fasting blood glucose in he morning.

## 2020-10-21 NOTE — Telephone Encounter (Signed)
Agree, continue to monitor home blood sugars.

## 2020-10-21 NOTE — Telephone Encounter (Signed)
Patient advised.

## 2020-10-22 ENCOUNTER — Encounter: Payer: Self-pay | Admitting: Family Medicine

## 2020-10-23 ENCOUNTER — Encounter: Payer: Self-pay | Admitting: Family Medicine

## 2020-10-26 DIAGNOSIS — Z5112 Encounter for antineoplastic immunotherapy: Secondary | ICD-10-CM | POA: Diagnosis not present

## 2020-10-26 DIAGNOSIS — C50811 Malignant neoplasm of overlapping sites of right female breast: Secondary | ICD-10-CM | POA: Diagnosis not present

## 2020-10-26 DIAGNOSIS — C7951 Secondary malignant neoplasm of bone: Secondary | ICD-10-CM | POA: Diagnosis not present

## 2020-10-26 DIAGNOSIS — Z170 Estrogen receptor positive status [ER+]: Secondary | ICD-10-CM | POA: Diagnosis not present

## 2020-10-26 DIAGNOSIS — C787 Secondary malignant neoplasm of liver and intrahepatic bile duct: Secondary | ICD-10-CM | POA: Diagnosis not present

## 2020-10-26 DIAGNOSIS — I10 Essential (primary) hypertension: Secondary | ICD-10-CM | POA: Diagnosis not present

## 2020-10-27 DIAGNOSIS — C787 Secondary malignant neoplasm of liver and intrahepatic bile duct: Secondary | ICD-10-CM | POA: Diagnosis not present

## 2020-10-27 DIAGNOSIS — Z170 Estrogen receptor positive status [ER+]: Secondary | ICD-10-CM | POA: Diagnosis not present

## 2020-10-27 DIAGNOSIS — C50811 Malignant neoplasm of overlapping sites of right female breast: Secondary | ICD-10-CM | POA: Diagnosis not present

## 2020-10-27 DIAGNOSIS — Z5111 Encounter for antineoplastic chemotherapy: Secondary | ICD-10-CM | POA: Diagnosis not present

## 2020-10-27 DIAGNOSIS — R7989 Other specified abnormal findings of blood chemistry: Secondary | ICD-10-CM | POA: Diagnosis not present

## 2020-10-27 DIAGNOSIS — K5900 Constipation, unspecified: Secondary | ICD-10-CM | POA: Diagnosis not present

## 2020-10-27 DIAGNOSIS — I10 Essential (primary) hypertension: Secondary | ICD-10-CM | POA: Diagnosis not present

## 2020-10-27 DIAGNOSIS — Z9189 Other specified personal risk factors, not elsewhere classified: Secondary | ICD-10-CM | POA: Diagnosis not present

## 2020-10-27 DIAGNOSIS — C7951 Secondary malignant neoplasm of bone: Secondary | ICD-10-CM | POA: Diagnosis not present

## 2020-10-27 DIAGNOSIS — K769 Liver disease, unspecified: Secondary | ICD-10-CM | POA: Diagnosis not present

## 2020-10-28 ENCOUNTER — Encounter: Payer: Self-pay | Admitting: Family Medicine

## 2020-11-01 ENCOUNTER — Encounter: Payer: Self-pay | Admitting: Family Medicine

## 2020-11-01 NOTE — Telephone Encounter (Signed)
Patient scheduled.

## 2020-11-01 NOTE — Telephone Encounter (Signed)
She should probably come in to have this checked out. Thanks!  CM

## 2020-11-02 ENCOUNTER — Ambulatory Visit: Payer: Medicare Other | Admitting: Family Medicine

## 2020-11-02 DIAGNOSIS — R502 Drug induced fever: Secondary | ICD-10-CM | POA: Diagnosis not present

## 2020-11-03 ENCOUNTER — Encounter: Payer: Self-pay | Admitting: Family Medicine

## 2020-11-05 ENCOUNTER — Other Ambulatory Visit: Payer: Self-pay

## 2020-11-05 ENCOUNTER — Encounter: Payer: Self-pay | Admitting: Family Medicine

## 2020-11-05 ENCOUNTER — Ambulatory Visit (INDEPENDENT_AMBULATORY_CARE_PROVIDER_SITE_OTHER): Payer: Medicare Other | Admitting: Family Medicine

## 2020-11-05 DIAGNOSIS — L03313 Cellulitis of chest wall: Secondary | ICD-10-CM

## 2020-11-05 MED ORDER — DOXYCYCLINE HYCLATE 100 MG PO TABS: 100.0000 mg | ORAL_TABLET | Freq: Two times a day (BID) | ORAL | 0 refills | Status: DC

## 2020-11-07 ENCOUNTER — Encounter: Payer: Self-pay | Admitting: Family Medicine

## 2020-11-07 DIAGNOSIS — L0390 Cellulitis, unspecified: Secondary | ICD-10-CM | POA: Insufficient documentation

## 2020-11-07 NOTE — Progress Notes (Signed)
DORA SIMEONE - 65 y.o. female MRN [id number]  Date of birth: Jun 20, 1956  Subjective Chief Complaint  Patient presents with  . Mass    HPI ARIANIE COUSE is 65 y.o. female here today with complaint of skin infection.    Has redness and induration between breast area.  Her oncologist started her on cipro for sinusitis and for skin infection recently.  Sinusitis has cleared up but skin lesion has not really improved.  She states she was trying to squeeze some blackheads out of area that is affected.  She denies fever or chills.  She is currently being treated for triple negative metastatic breast cancer.    ROS:  A comprehensive ROS was completed and negative except as noted per HPI  Allergies  Allergen Reactions  . Penicillins Shortness Of Breath    Reaction as a child Did it involve swelling of the face/tongue/throat, SOB, or low BP? Yes Did it involve sudden or severe rash/hives, skin peeling, or any reaction on the inside of your mouth or nose?Unknown Did you need to seek medical attention at a hospital or doctor's office? Yes When did it last happen? Childhood reaction. If all above answers are "NO", may proceed with cephalosporin use.   . Sulfa Antibiotics Nausea Only  . Effexor [Venlafaxine] Other (See Comments)    "dopey"    Past Medical History:  Diagnosis Date  . Anxiety   . Asthma   . Breast cancer ([place])   . Chest pain, atypical   . Closed head injury 06/06/2017  . Depression   . Heart palpitations    hx of MVP  . Hypertension   . IBS (irritable bowel syndrome)   . Pre-diabetes     Past Surgical History:  Procedure Laterality Date  . BREAST SURGERY  06/2018   right and left breast  . eAB     x2   . MASTECTOMY MODIFIED RADICAL Bilateral 10/10/2018   Procedure: RIGHT MODIFIED RADICAL MASTECTOMY AND LEFT PROPHYLATIC MASTECTOMY;  Surgeon: [name], MD;  Location: [facility];  Service: General;  Laterality: Bilateral;  . PORTACATH PLACEMENT Left 08/12/2018    Procedure: INSERTION PORT-A-CATH;  Surgeon: [name], MD;  Location: [facility];  Service: General;  Laterality: Left;    Social History   Socioeconomic History  . Marital status: Divorced    Spouse name: Not on file  . Number of children: Not on file  . Years of education: Not on file  . Highest education level: Not on file  Occupational History  . Not on file  Tobacco Use  . Smoking status: Never Smoker  . Smokeless tobacco: Never Used  . Tobacco comment: nonsmoker  Vaping Use  . Vaping Use: Never used  Substance and Sexual Activity  . Alcohol use: No  . Drug use: No  . Sexual activity: Not Currently    Birth control/protection: None  Other Topics Concern  . Not on file  Social History Narrative   Divorced; grown son lives in [place].    [facility]- PCP   Social Determinants of Health   Financial Resource Strain: Not on file  Food Insecurity: Not on file  Transportation Needs: Not on file  Physical Activity: Not on file  Stress: Not on file  Social Connections: Not on file    Family History  Problem Relation Age of Onset  . Heart attack Father   . Lung cancer Mother   . Bladder Cancer Brother  Health Maintenance  Topic Date Due  . FOOT EXAM  Never done  . OPHTHALMOLOGY EXAM  11/03/2020  . DEXA SCAN  11/23/2020 (Originally 11/23/2019)  . PNA vac Low Risk Adult (1 of 2 - PCV13) 11/23/2020 (Originally 11/23/2019)  . COLONOSCOPY (Pts 45-18yrs Insurance coverage will need to be confirmed)  12/31/2020 (Originally 11/23/1999)  . INFLUENZA VACCINE  02/28/2021  . HEMOGLOBIN A1C  02/28/2021  . PAP SMEAR-Modifier  01/26/2022  . TETANUS/TDAP  03/28/2028  . Hepatitis C Screening  Completed  . HIV Screening  Completed  . HPV VACCINES  Aged Out  . MAMMOGRAM  Discontinued  . COVID-19 Vaccine  Discontinued      ----------------------------------------------------------------------------------------------------------------------------------------------------------------------------------------------------------------- Physical Exam BP (!) 120/54 (BP Location: Left Arm, Patient Position: Sitting, Cuff Size: Normal)   Pulse 87   Ht 5' 4.5" (1.638 m)   Wt 167 lb 14.4 oz (76.2 kg)   SpO2 100%   BMI 28.37 kg/m   Physical Exam Constitutional:      Appearance: Normal appearance.  HENT:     Head: Normocephalic and atraumatic.  Eyes:     General: No scleral icterus. Cardiovascular:     Rate and Rhythm: Normal rate and regular rhythm.  Pulmonary:     Effort: Pulmonary effort is normal.     Breath sounds: Normal breath sounds.  Musculoskeletal:     Cervical back: Neck supple.  Skin:    Comments: Erythematous, indurated area between breasts.  There is no fluctuance.    Neurological:     General: No focal deficit present.     Mental Status: She is alert.  Psychiatric:        Mood and Affect: Mood normal.        Behavior: Behavior normal.     ------------------------------------------------------------------------------------------------------------------------------------------------------------------------------------------------------------------- Assessment and Plan  Cellulitis Add on doxycycline for better coverage of soft tissue infection.  Recommend that she follow up if symptoms aren't improving or seem to be worsening she should follow up with me.     Meds ordered this encounter  Medications  . doxycycline (VIBRA-TABS) 100 MG tablet    Sig: Take 1 tablet (100 mg total) by mouth 2 (two) times daily.    Dispense:  20 tablet    Refill:  0    No follow-ups on file.    This visit occurred during the SARS-CoV-2 public health emergency.  Safety protocols were in place, including screening questions prior to the visit, additional usage of staff PPE, and extensive cleaning of  exam room while observing appropriate contact time as indicated for disinfecting solutions.

## 2020-11-07 NOTE — Assessment & Plan Note (Signed)
Add on doxycycline for better coverage of soft tissue infection.  Recommend that she follow up if symptoms aren't improving or seem to be worsening she should follow up with me.

## 2020-11-08 ENCOUNTER — Encounter: Payer: Self-pay | Admitting: Family Medicine

## 2020-11-09 ENCOUNTER — Ambulatory Visit: Payer: Medicare Other | Admitting: Family Medicine

## 2020-11-09 ENCOUNTER — Other Ambulatory Visit: Payer: Self-pay

## 2020-11-09 ENCOUNTER — Encounter: Payer: Self-pay | Admitting: Family Medicine

## 2020-11-09 DIAGNOSIS — C787 Secondary malignant neoplasm of liver and intrahepatic bile duct: Secondary | ICD-10-CM | POA: Diagnosis not present

## 2020-11-09 DIAGNOSIS — I10 Essential (primary) hypertension: Secondary | ICD-10-CM | POA: Diagnosis not present

## 2020-11-09 DIAGNOSIS — C50811 Malignant neoplasm of overlapping sites of right female breast: Secondary | ICD-10-CM | POA: Diagnosis not present

## 2020-11-09 DIAGNOSIS — C7951 Secondary malignant neoplasm of bone: Secondary | ICD-10-CM | POA: Diagnosis not present

## 2020-11-09 DIAGNOSIS — T8133XA Disruption of traumatic injury wound repair, initial encounter: Secondary | ICD-10-CM | POA: Diagnosis not present

## 2020-11-09 DIAGNOSIS — Z170 Estrogen receptor positive status [ER+]: Secondary | ICD-10-CM | POA: Diagnosis not present

## 2020-11-09 DIAGNOSIS — S21101S Unspecified open wound of right front wall of thorax without penetration into thoracic cavity, sequela: Secondary | ICD-10-CM | POA: Diagnosis not present

## 2020-11-09 DIAGNOSIS — T300 Burn of unspecified body region, unspecified degree: Secondary | ICD-10-CM | POA: Diagnosis not present

## 2020-11-09 MED ORDER — BUDESONIDE-FORMOTEROL FUMARATE 160-4.5 MCG/ACT IN AERO: 2.0000 | INHALATION_SPRAY | Freq: Two times a day (BID) | RESPIRATORY_TRACT | 2 refills | Status: DC

## 2020-11-09 NOTE — Telephone Encounter (Signed)
Faxed Rx for Symbicort to [facility] ([phone]) per patient's request.

## 2020-11-10 ENCOUNTER — Encounter: Payer: Self-pay | Admitting: Family Medicine

## 2020-11-10 DIAGNOSIS — C7951 Secondary malignant neoplasm of bone: Secondary | ICD-10-CM | POA: Diagnosis not present

## 2020-11-10 DIAGNOSIS — Z170 Estrogen receptor positive status [ER+]: Secondary | ICD-10-CM | POA: Diagnosis not present

## 2020-11-10 DIAGNOSIS — T8133XA Disruption of traumatic injury wound repair, initial encounter: Secondary | ICD-10-CM | POA: Diagnosis not present

## 2020-11-10 DIAGNOSIS — C787 Secondary malignant neoplasm of liver and intrahepatic bile duct: Secondary | ICD-10-CM | POA: Diagnosis not present

## 2020-11-10 DIAGNOSIS — Z9189 Other specified personal risk factors, not elsewhere classified: Secondary | ICD-10-CM | POA: Diagnosis not present

## 2020-11-10 DIAGNOSIS — C50811 Malignant neoplasm of overlapping sites of right female breast: Secondary | ICD-10-CM | POA: Diagnosis not present

## 2020-11-10 DIAGNOSIS — R7989 Other specified abnormal findings of blood chemistry: Secondary | ICD-10-CM | POA: Diagnosis not present

## 2020-11-10 DIAGNOSIS — I10 Essential (primary) hypertension: Secondary | ICD-10-CM | POA: Diagnosis not present

## 2020-11-11 DIAGNOSIS — L723 Sebaceous cyst: Secondary | ICD-10-CM | POA: Diagnosis not present

## 2020-11-11 DIAGNOSIS — L089 Local infection of the skin and subcutaneous tissue, unspecified: Secondary | ICD-10-CM | POA: Diagnosis not present

## 2020-11-11 IMAGING — US ULTRASOUND RIGHT BREAST LIMITED
1 series · 13 of 25 positions shown · non-contrast
Comparison: Previous exam(s).

CLINICAL DATA: Screening recall for right breast masses.

EXAM:
DIGITAL DIAGNOSTIC UNILATERAL RIGHT MAMMOGRAM WITH CAD AND TOMO
RIGHT BREAST ULTRASOUND

[Series 1: ultrasound right breast limited · 0.08mm/px · 13 of 26 slices shown]
[im 1/26]
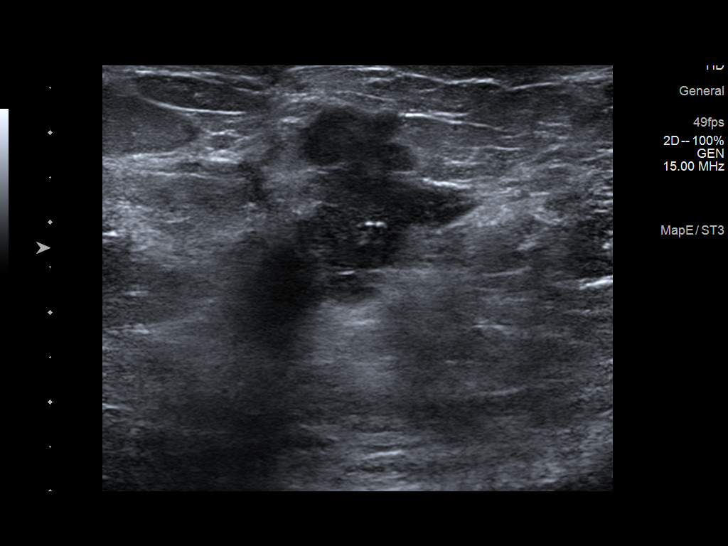
[im 3/26]
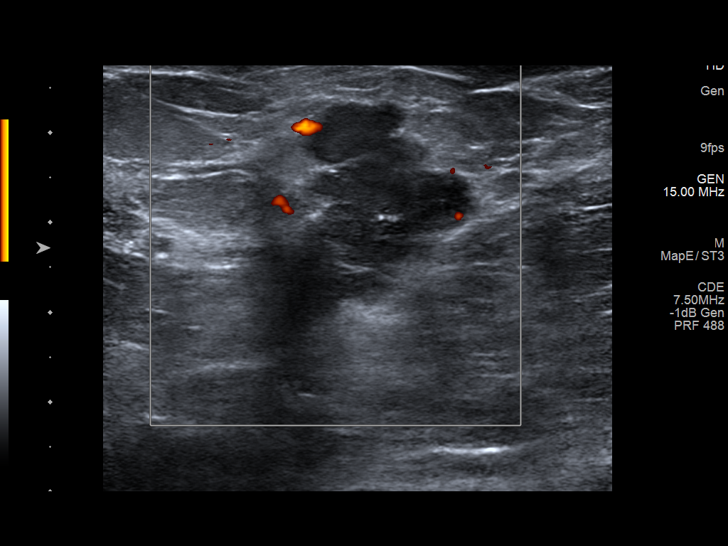
[im 5/26]
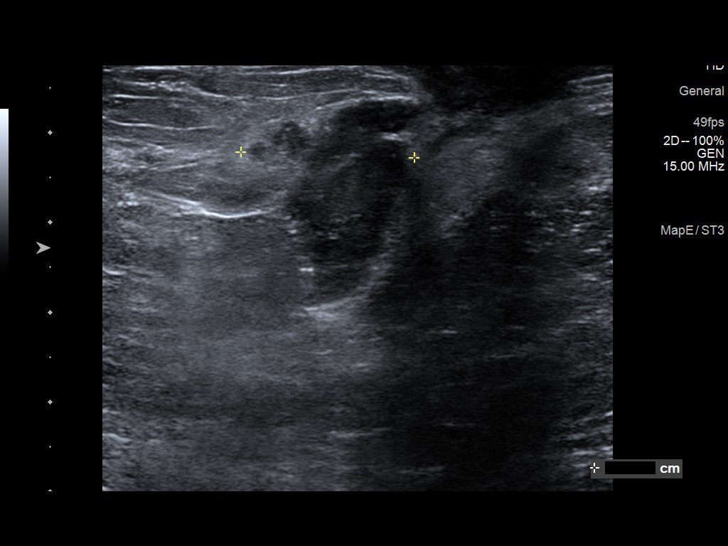
[im 7/26]
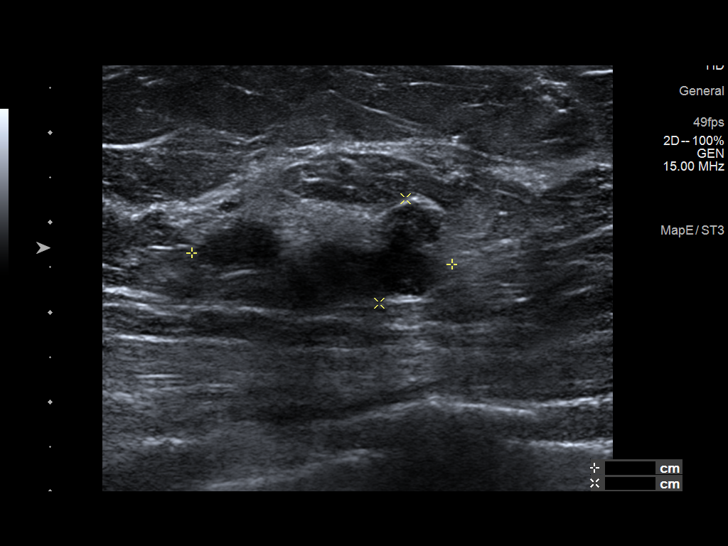
[im 9/26]
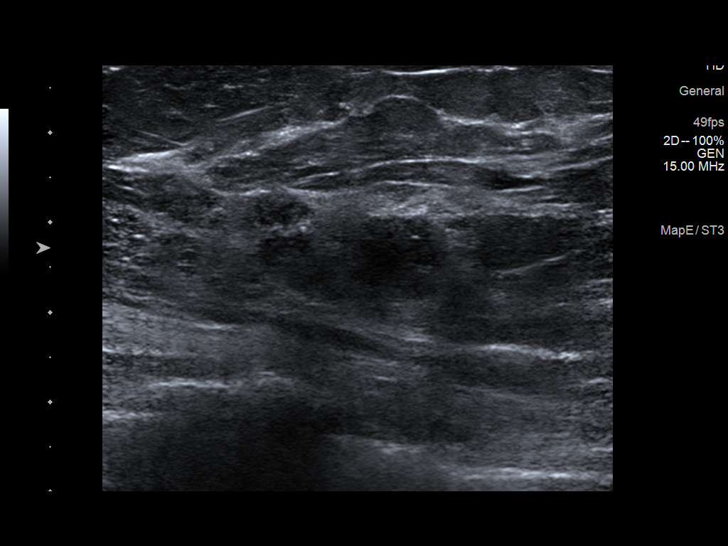
[im 11/26]
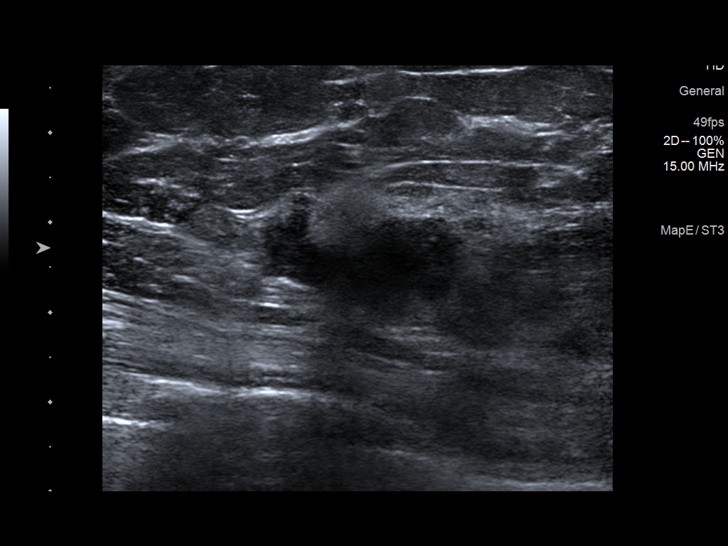
[im 13/26]
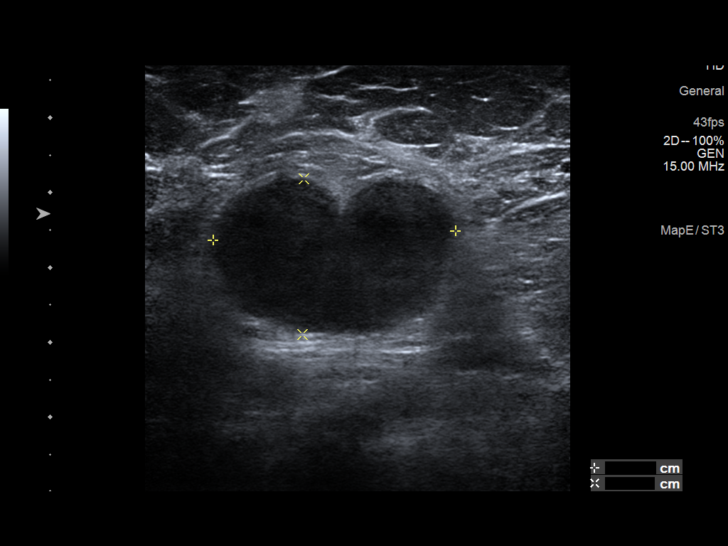
[im 15/26]
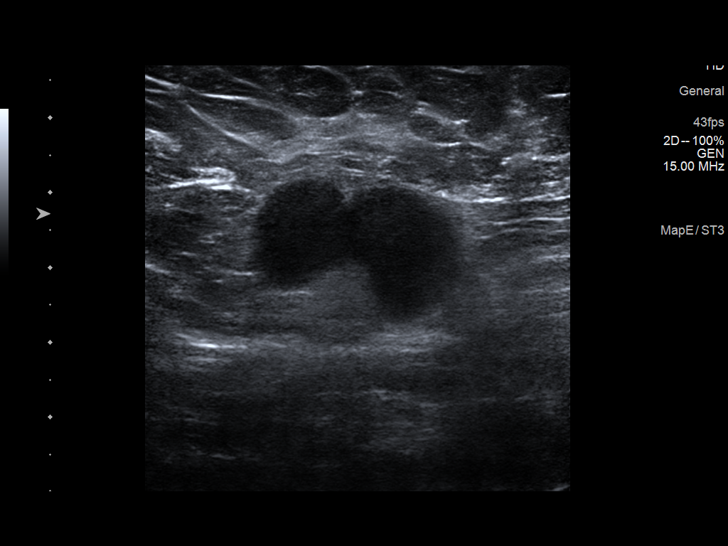
[im 17/26]
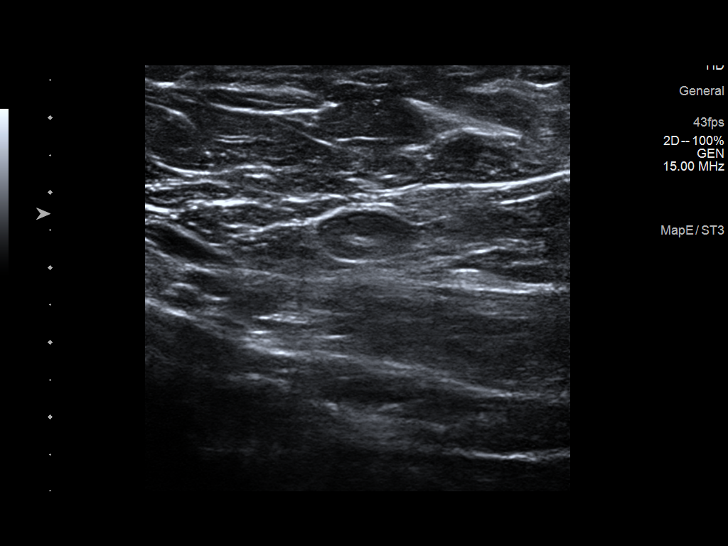
[im 19/26]
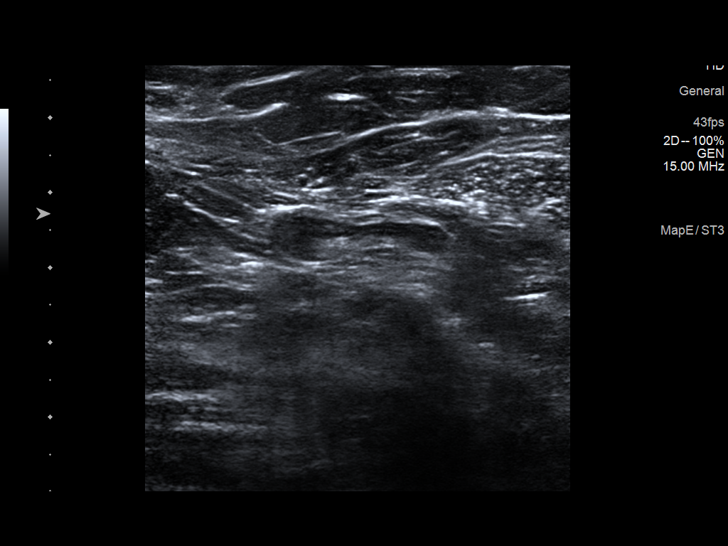
[im 21/26]
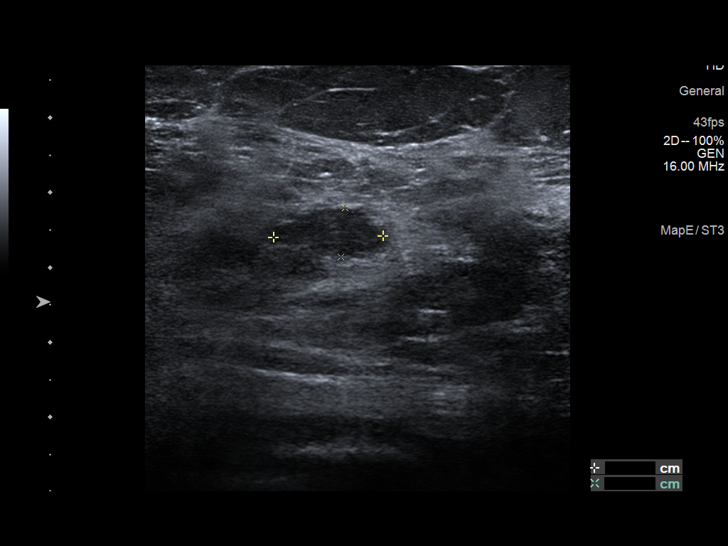
[im 23/26]
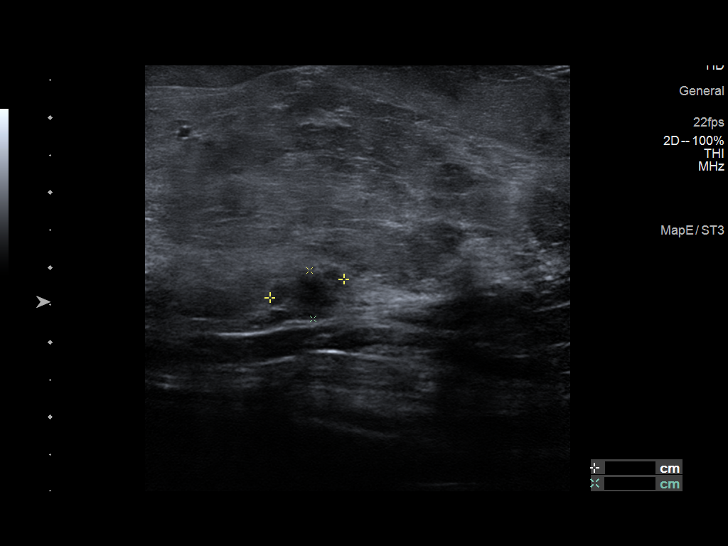
[im 26/26]
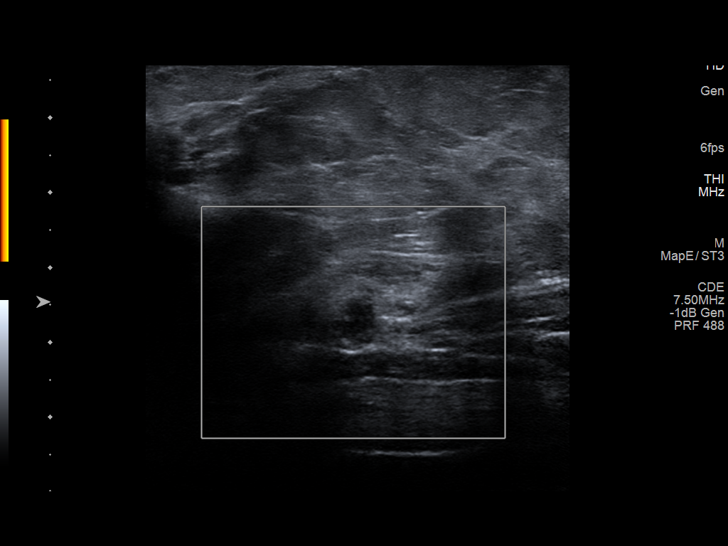

[13 of 25 positions shown; findings below may reference images not displayed]

ACR Breast Density Category b: There are scattered areas of
fibroglandular density.
FINDINGS: Additional tomograms were performed of the right breast. There is an
irregular mass in the retroareolar right breast measuring
approximately 3.4 cm. There is a mass with irregular margins in the
upper-outer posterior right breast measuring approximately 3 cm.
There are 2 additional adjacent irregular masses located slightly
inferior and anterior to the posterior mass measuring up to 1.5 cm.

Mammographic images were processed with CAD.

Physical examination of the right breast reveals a firm periareolar
mass.

Targeted ultrasound of the right breast was performed. There is an
irregular hypoechoic mass at 10 o'clock 1 cm from the nipple
measuring 2.8 x 3 x 1.9 cm. There is an irregular mass at the [DATE]
position 10 cm from nipple measuring 2.9 x 1.2 x 2.4 cm.

An additional possible smaller mass was seen between the 2 dominant
masses at 10 o'clock 4 cm from nipple measuring up to 1.5 cm
although this was difficult to reproduce. A mass at 10 o'clock 7 cm
from nipple posterior depth measures 1 x 0.7 x 0.5 cm.

An enlarged and morphologically abnormal lymph node in the right
axilla measures 3.2 x 2.1 x 2.8 cm.
IMPRESSION: 1. Highly suspicious right breast masses in the periareolar as well
as upper far outer right breast, with additional suspicious smaller
masses located between these 2 dominant masses.

2.  Highly suspicious right axillary lymph node.

RECOMMENDATION:
1. Recommend ultrasound-guided biopsy of the mass in the right
breast at 10 o'clock 1 cm from the nipple and ultrasound-guided
biopsy of the mass at the [DATE] position 10 cm from nipple.

2. Recommend ultrasound-guided biopsy of the morphologically
abnormal lymph node in the low right axilla.

I have discussed the findings and recommendations with the patient.
Results were also provided in writing at the conclusion of the
visit. If applicable, a reminder letter will be sent to the patient
regarding the next appointment.

BI-RADS CATEGORY  5: Highly suggestive of malignancy.

## 2020-11-11 IMAGING — MG DIGITAL DIAGNOSTIC UNILATERAL RIGHT MAMMOGRAM WITH TOMO AND CAD
8 series · 8 of 24 positions shown · non-contrast
Comparison: Previous exam(s).

CLINICAL DATA: Screening recall for right breast masses.

EXAM:
DIGITAL DIAGNOSTIC UNILATERAL RIGHT MAMMOGRAM WITH CAD AND TOMO
RIGHT BREAST ULTRASOUND

[R CC synth-2D (1 of 2)]
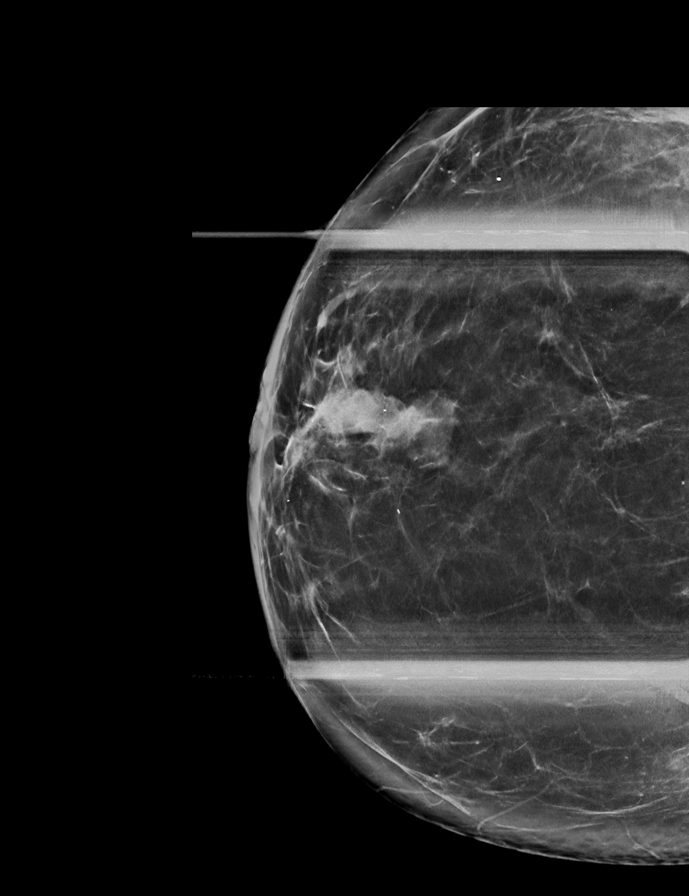

[R MLO synth-2D (1 of 2)]
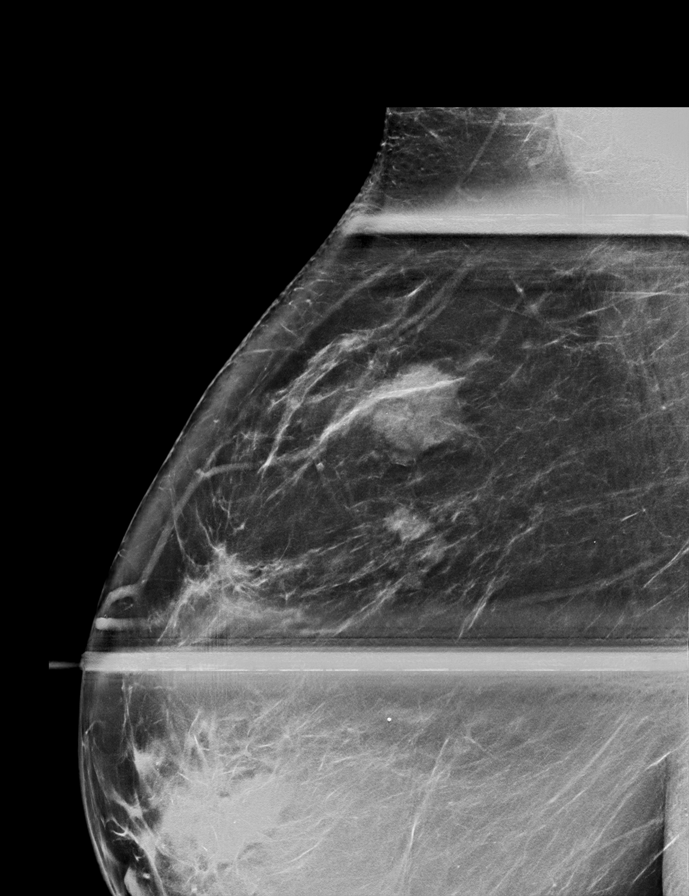

[R MLO synth-2D (2 of 2)]
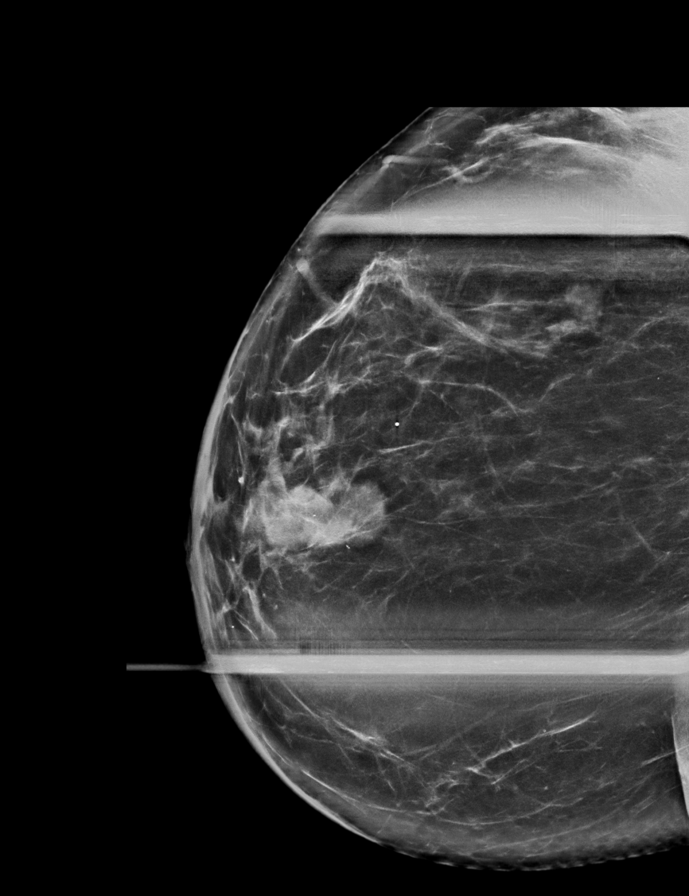

[R CC synth-2D (2 of 2)]
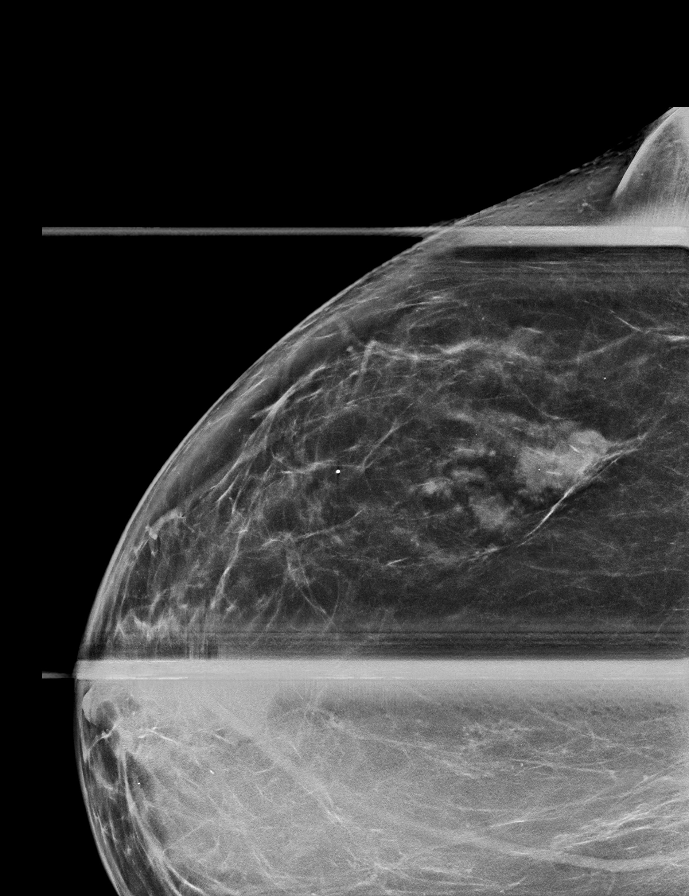

[R CC tomo (1 of 2) · tomo slice 41/80.0]
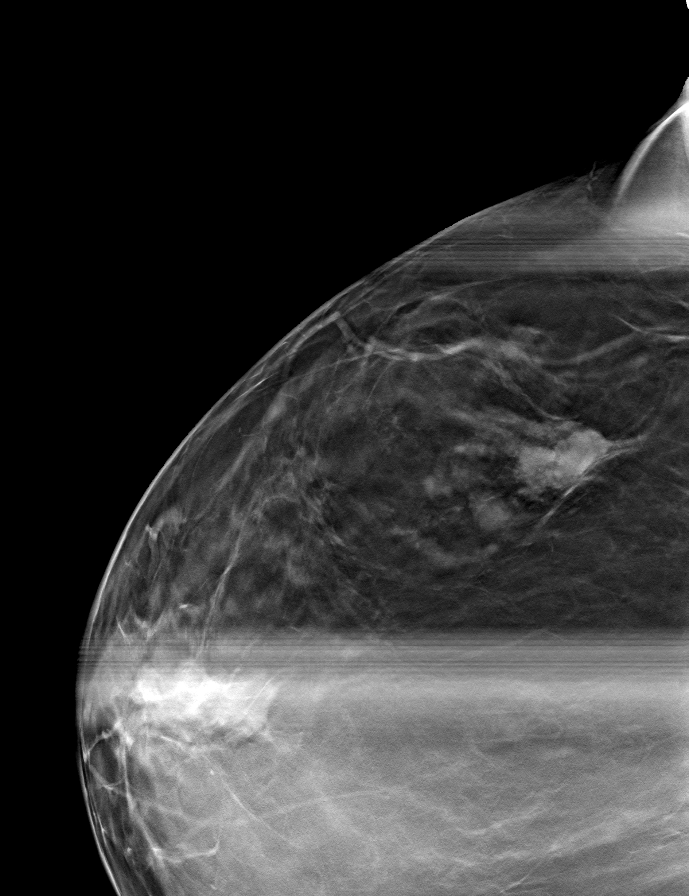

[R MLO tomo (1 of 2) · tomo slice 43/85.0]
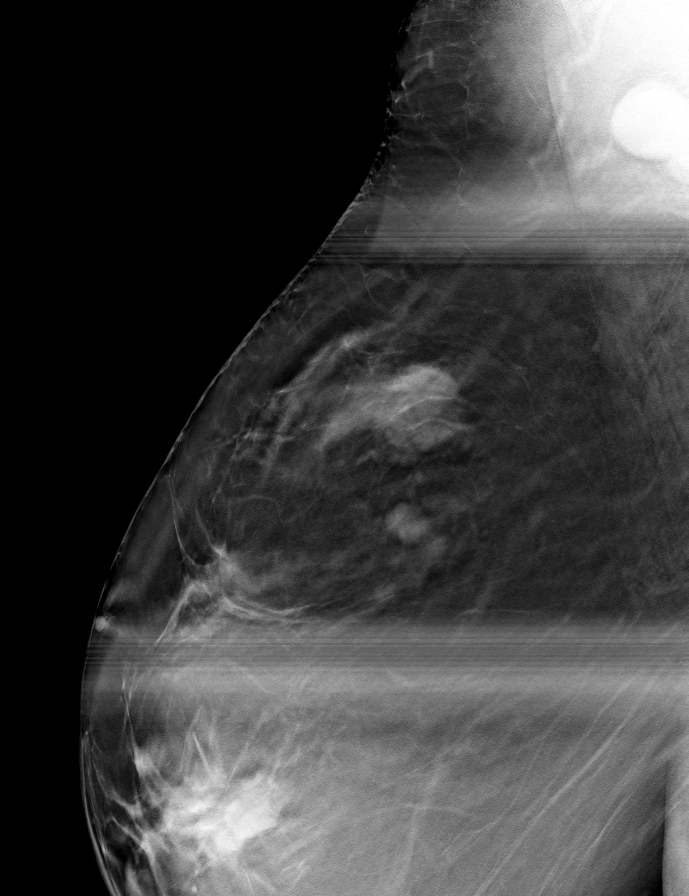

[R CC tomo (2 of 2) · tomo slice 38/75.0]
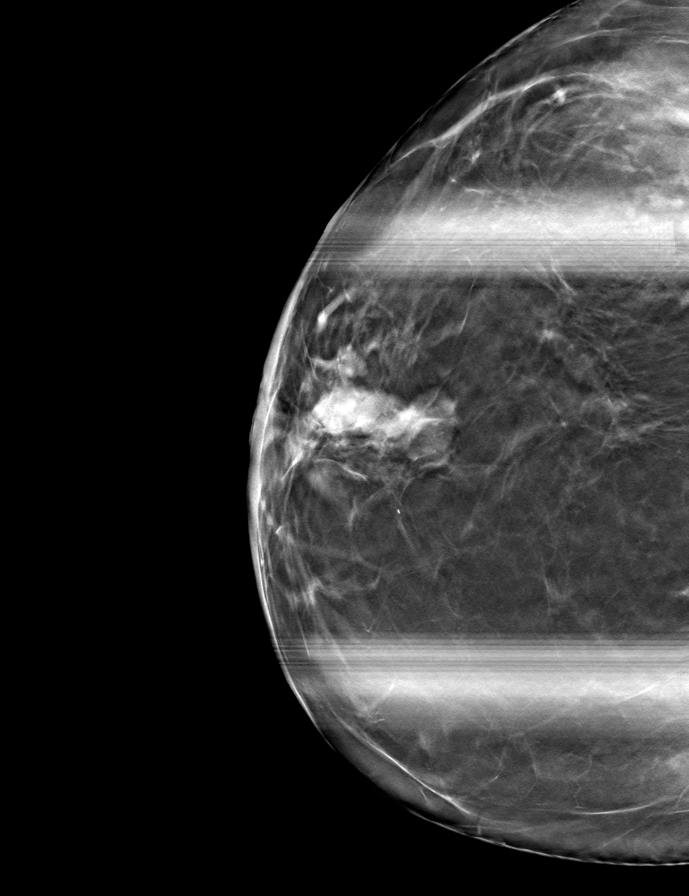

[R MLO tomo (2 of 2) · tomo slice 45/88.0]
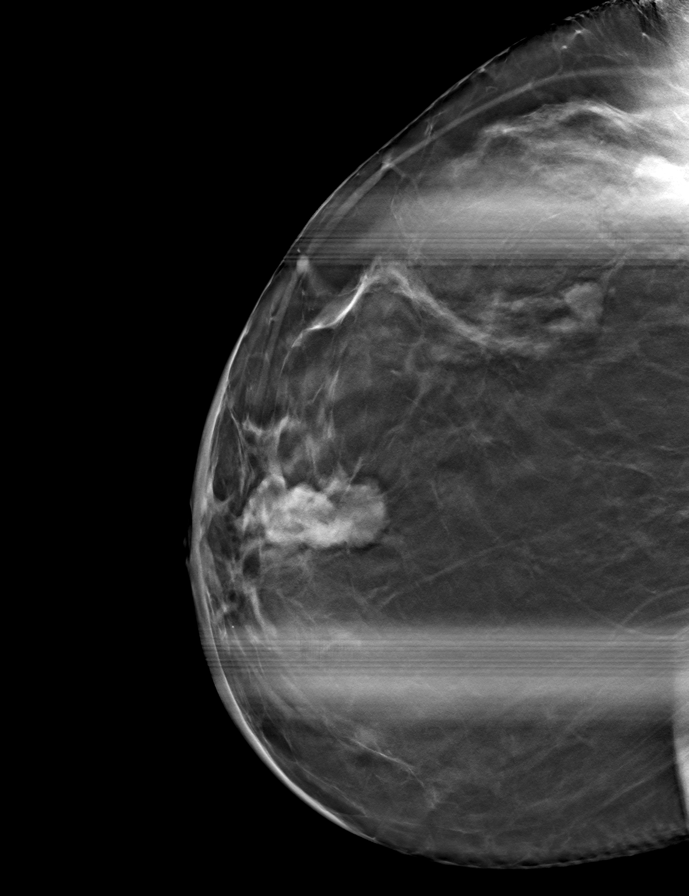

[8 of 24 positions shown; findings below may reference images not displayed]

ACR Breast Density Category b: There are scattered areas of
fibroglandular density.
FINDINGS: Additional tomograms were performed of the right breast. There is an
irregular mass in the retroareolar right breast measuring
approximately 3.4 cm. There is a mass with irregular margins in the
upper-outer posterior right breast measuring approximately 3 cm.
There are 2 additional adjacent irregular masses located slightly
inferior and anterior to the posterior mass measuring up to 1.5 cm.

Mammographic images were processed with CAD.

Physical examination of the right breast reveals a firm periareolar
mass.

Targeted ultrasound of the right breast was performed. There is an
irregular hypoechoic mass at 10 o'clock 1 cm from the nipple
measuring 2.8 x 3 x 1.9 cm. There is an irregular mass at the [DATE]
position 10 cm from nipple measuring 2.9 x 1.2 x 2.4 cm.

An additional possible smaller mass was seen between the 2 dominant
masses at 10 o'clock 4 cm from nipple measuring up to 1.5 cm
although this was difficult to reproduce. A mass at 10 o'clock 7 cm
from nipple posterior depth measures 1 x 0.7 x 0.5 cm.

An enlarged and morphologically abnormal lymph node in the right
axilla measures 3.2 x 2.1 x 2.8 cm.
IMPRESSION: 1. Highly suspicious right breast masses in the periareolar as well
as upper far outer right breast, with additional suspicious smaller
masses located between these 2 dominant masses.

2.  Highly suspicious right axillary lymph node.

RECOMMENDATION:
1. Recommend ultrasound-guided biopsy of the mass in the right
breast at 10 o'clock 1 cm from the nipple and ultrasound-guided
biopsy of the mass at the [DATE] position 10 cm from nipple.

2. Recommend ultrasound-guided biopsy of the morphologically
abnormal lymph node in the low right axilla.

I have discussed the findings and recommendations with the patient.
Results were also provided in writing at the conclusion of the
visit. If applicable, a reminder letter will be sent to the patient
regarding the next appointment.

BI-RADS CATEGORY  5: Highly suggestive of malignancy.

## 2020-11-16 DIAGNOSIS — Z170 Estrogen receptor positive status [ER+]: Secondary | ICD-10-CM | POA: Diagnosis not present

## 2020-11-16 DIAGNOSIS — C50811 Malignant neoplasm of overlapping sites of right female breast: Secondary | ICD-10-CM | POA: Diagnosis not present

## 2020-11-17 ENCOUNTER — Encounter: Payer: Self-pay | Admitting: Family Medicine

## 2020-11-17 DIAGNOSIS — C7951 Secondary malignant neoplasm of bone: Secondary | ICD-10-CM | POA: Diagnosis not present

## 2020-11-17 DIAGNOSIS — C787 Secondary malignant neoplasm of liver and intrahepatic bile duct: Secondary | ICD-10-CM | POA: Diagnosis not present

## 2020-11-17 DIAGNOSIS — Z5112 Encounter for antineoplastic immunotherapy: Secondary | ICD-10-CM | POA: Diagnosis not present

## 2020-11-17 DIAGNOSIS — Z170 Estrogen receptor positive status [ER+]: Secondary | ICD-10-CM | POA: Diagnosis not present

## 2020-11-17 DIAGNOSIS — C50811 Malignant neoplasm of overlapping sites of right female breast: Secondary | ICD-10-CM | POA: Diagnosis not present

## 2020-11-23 DIAGNOSIS — C787 Secondary malignant neoplasm of liver and intrahepatic bile duct: Secondary | ICD-10-CM | POA: Diagnosis not present

## 2020-11-23 DIAGNOSIS — C7951 Secondary malignant neoplasm of bone: Secondary | ICD-10-CM | POA: Diagnosis not present

## 2020-11-23 DIAGNOSIS — Z170 Estrogen receptor positive status [ER+]: Secondary | ICD-10-CM | POA: Diagnosis not present

## 2020-11-23 DIAGNOSIS — C50811 Malignant neoplasm of overlapping sites of right female breast: Secondary | ICD-10-CM | POA: Diagnosis not present

## 2020-11-24 DIAGNOSIS — C50811 Malignant neoplasm of overlapping sites of right female breast: Secondary | ICD-10-CM | POA: Diagnosis not present

## 2020-11-24 DIAGNOSIS — C787 Secondary malignant neoplasm of liver and intrahepatic bile duct: Secondary | ICD-10-CM | POA: Diagnosis not present

## 2020-11-24 DIAGNOSIS — Z5111 Encounter for antineoplastic chemotherapy: Secondary | ICD-10-CM | POA: Diagnosis not present

## 2020-11-24 DIAGNOSIS — C7951 Secondary malignant neoplasm of bone: Secondary | ICD-10-CM | POA: Diagnosis not present

## 2020-11-24 DIAGNOSIS — Z170 Estrogen receptor positive status [ER+]: Secondary | ICD-10-CM | POA: Diagnosis not present

## 2020-11-25 DIAGNOSIS — C50811 Malignant neoplasm of overlapping sites of right female breast: Secondary | ICD-10-CM | POA: Diagnosis not present

## 2020-11-25 DIAGNOSIS — Z170 Estrogen receptor positive status [ER+]: Secondary | ICD-10-CM | POA: Diagnosis not present

## 2020-11-25 DIAGNOSIS — C7951 Secondary malignant neoplasm of bone: Secondary | ICD-10-CM | POA: Diagnosis not present

## 2020-11-25 DIAGNOSIS — C787 Secondary malignant neoplasm of liver and intrahepatic bile duct: Secondary | ICD-10-CM | POA: Diagnosis not present

## 2020-11-26 ENCOUNTER — Ambulatory Visit: Payer: Medicare Other | Admitting: Podiatry

## 2020-12-01 ENCOUNTER — Encounter: Payer: Self-pay | Admitting: Family Medicine

## 2020-12-03 ENCOUNTER — Encounter: Payer: Self-pay | Admitting: Family Medicine

## 2020-12-06 ENCOUNTER — Encounter: Payer: Self-pay | Admitting: Family Medicine

## 2020-12-08 DIAGNOSIS — C50811 Malignant neoplasm of overlapping sites of right female breast: Secondary | ICD-10-CM | POA: Diagnosis not present

## 2020-12-08 DIAGNOSIS — C787 Secondary malignant neoplasm of liver and intrahepatic bile duct: Secondary | ICD-10-CM | POA: Diagnosis not present

## 2020-12-08 DIAGNOSIS — Z203 Contact with and (suspected) exposure to rabies: Secondary | ICD-10-CM | POA: Diagnosis not present

## 2020-12-08 DIAGNOSIS — Z170 Estrogen receptor positive status [ER+]: Secondary | ICD-10-CM | POA: Diagnosis not present

## 2020-12-08 DIAGNOSIS — C7951 Secondary malignant neoplasm of bone: Secondary | ICD-10-CM | POA: Diagnosis not present

## 2020-12-09 DIAGNOSIS — C7951 Secondary malignant neoplasm of bone: Secondary | ICD-10-CM | POA: Diagnosis not present

## 2020-12-09 DIAGNOSIS — C50811 Malignant neoplasm of overlapping sites of right female breast: Secondary | ICD-10-CM | POA: Diagnosis not present

## 2020-12-09 DIAGNOSIS — Z5112 Encounter for antineoplastic immunotherapy: Secondary | ICD-10-CM | POA: Diagnosis not present

## 2020-12-09 DIAGNOSIS — Z170 Estrogen receptor positive status [ER+]: Secondary | ICD-10-CM | POA: Diagnosis not present

## 2020-12-09 DIAGNOSIS — C787 Secondary malignant neoplasm of liver and intrahepatic bile duct: Secondary | ICD-10-CM | POA: Diagnosis not present

## 2020-12-09 DIAGNOSIS — S21101S Unspecified open wound of right front wall of thorax without penetration into thoracic cavity, sequela: Secondary | ICD-10-CM | POA: Diagnosis not present

## 2020-12-15 DIAGNOSIS — C7951 Secondary malignant neoplasm of bone: Secondary | ICD-10-CM | POA: Diagnosis not present

## 2020-12-15 DIAGNOSIS — C50811 Malignant neoplasm of overlapping sites of right female breast: Secondary | ICD-10-CM | POA: Diagnosis not present

## 2020-12-15 DIAGNOSIS — Z170 Estrogen receptor positive status [ER+]: Secondary | ICD-10-CM | POA: Diagnosis not present

## 2020-12-15 DIAGNOSIS — C787 Secondary malignant neoplasm of liver and intrahepatic bile duct: Secondary | ICD-10-CM | POA: Diagnosis not present

## 2020-12-16 DIAGNOSIS — Z170 Estrogen receptor positive status [ER+]: Secondary | ICD-10-CM | POA: Diagnosis not present

## 2020-12-16 DIAGNOSIS — Z5111 Encounter for antineoplastic chemotherapy: Secondary | ICD-10-CM | POA: Diagnosis not present

## 2020-12-16 DIAGNOSIS — C787 Secondary malignant neoplasm of liver and intrahepatic bile duct: Secondary | ICD-10-CM | POA: Diagnosis not present

## 2020-12-16 DIAGNOSIS — C50811 Malignant neoplasm of overlapping sites of right female breast: Secondary | ICD-10-CM | POA: Diagnosis not present

## 2020-12-16 DIAGNOSIS — C7951 Secondary malignant neoplasm of bone: Secondary | ICD-10-CM | POA: Diagnosis not present

## 2020-12-17 DIAGNOSIS — C50811 Malignant neoplasm of overlapping sites of right female breast: Secondary | ICD-10-CM | POA: Diagnosis not present

## 2020-12-17 DIAGNOSIS — C787 Secondary malignant neoplasm of liver and intrahepatic bile duct: Secondary | ICD-10-CM | POA: Diagnosis not present

## 2020-12-17 DIAGNOSIS — C7951 Secondary malignant neoplasm of bone: Secondary | ICD-10-CM | POA: Diagnosis not present

## 2020-12-17 DIAGNOSIS — Z170 Estrogen receptor positive status [ER+]: Secondary | ICD-10-CM | POA: Diagnosis not present

## 2020-12-21 ENCOUNTER — Encounter: Payer: Self-pay | Admitting: Family Medicine

## 2020-12-23 DIAGNOSIS — N289 Disorder of kidney and ureter, unspecified: Secondary | ICD-10-CM | POA: Diagnosis not present

## 2020-12-23 DIAGNOSIS — R918 Other nonspecific abnormal finding of lung field: Secondary | ICD-10-CM | POA: Diagnosis not present

## 2020-12-23 DIAGNOSIS — Z170 Estrogen receptor positive status [ER+]: Secondary | ICD-10-CM | POA: Diagnosis not present

## 2020-12-23 DIAGNOSIS — C7951 Secondary malignant neoplasm of bone: Secondary | ICD-10-CM | POA: Diagnosis not present

## 2020-12-23 DIAGNOSIS — C787 Secondary malignant neoplasm of liver and intrahepatic bile duct: Secondary | ICD-10-CM | POA: Diagnosis not present

## 2020-12-23 DIAGNOSIS — C50811 Malignant neoplasm of overlapping sites of right female breast: Secondary | ICD-10-CM | POA: Diagnosis not present

## 2020-12-24 ENCOUNTER — Encounter: Payer: Self-pay | Admitting: Family Medicine

## 2020-12-29 DIAGNOSIS — C787 Secondary malignant neoplasm of liver and intrahepatic bile duct: Secondary | ICD-10-CM | POA: Diagnosis not present

## 2020-12-29 DIAGNOSIS — Z170 Estrogen receptor positive status [ER+]: Secondary | ICD-10-CM | POA: Diagnosis not present

## 2020-12-29 DIAGNOSIS — C50811 Malignant neoplasm of overlapping sites of right female breast: Secondary | ICD-10-CM | POA: Diagnosis not present

## 2020-12-29 DIAGNOSIS — C7951 Secondary malignant neoplasm of bone: Secondary | ICD-10-CM | POA: Diagnosis not present

## 2020-12-30 DIAGNOSIS — R945 Abnormal results of liver function studies: Secondary | ICD-10-CM | POA: Diagnosis not present

## 2020-12-30 DIAGNOSIS — Z5111 Encounter for antineoplastic chemotherapy: Secondary | ICD-10-CM | POA: Diagnosis not present

## 2020-12-30 DIAGNOSIS — C7951 Secondary malignant neoplasm of bone: Secondary | ICD-10-CM | POA: Diagnosis not present

## 2020-12-30 DIAGNOSIS — C773 Secondary and unspecified malignant neoplasm of axilla and upper limb lymph nodes: Secondary | ICD-10-CM | POA: Diagnosis not present

## 2020-12-30 DIAGNOSIS — Z95828 Presence of other vascular implants and grafts: Secondary | ICD-10-CM | POA: Diagnosis not present

## 2020-12-30 DIAGNOSIS — Z9189 Other specified personal risk factors, not elsewhere classified: Secondary | ICD-10-CM | POA: Diagnosis not present

## 2020-12-30 DIAGNOSIS — Z170 Estrogen receptor positive status [ER+]: Secondary | ICD-10-CM | POA: Diagnosis not present

## 2020-12-30 DIAGNOSIS — C771 Secondary and unspecified malignant neoplasm of intrathoracic lymph nodes: Secondary | ICD-10-CM | POA: Diagnosis not present

## 2020-12-30 DIAGNOSIS — C787 Secondary malignant neoplasm of liver and intrahepatic bile duct: Secondary | ICD-10-CM | POA: Diagnosis not present

## 2020-12-30 DIAGNOSIS — I10 Essential (primary) hypertension: Secondary | ICD-10-CM | POA: Diagnosis not present

## 2020-12-30 DIAGNOSIS — C50811 Malignant neoplasm of overlapping sites of right female breast: Secondary | ICD-10-CM | POA: Diagnosis not present

## 2020-12-30 DIAGNOSIS — R7989 Other specified abnormal findings of blood chemistry: Secondary | ICD-10-CM | POA: Diagnosis not present

## 2021-01-03 ENCOUNTER — Encounter: Payer: Self-pay | Admitting: Family Medicine

## 2021-01-05 DIAGNOSIS — C50811 Malignant neoplasm of overlapping sites of right female breast: Secondary | ICD-10-CM | POA: Diagnosis not present

## 2021-01-05 DIAGNOSIS — C7951 Secondary malignant neoplasm of bone: Secondary | ICD-10-CM | POA: Diagnosis not present

## 2021-01-05 DIAGNOSIS — Z170 Estrogen receptor positive status [ER+]: Secondary | ICD-10-CM | POA: Diagnosis not present

## 2021-01-05 DIAGNOSIS — C787 Secondary malignant neoplasm of liver and intrahepatic bile duct: Secondary | ICD-10-CM | POA: Diagnosis not present

## 2021-01-06 DIAGNOSIS — C787 Secondary malignant neoplasm of liver and intrahepatic bile duct: Secondary | ICD-10-CM | POA: Diagnosis not present

## 2021-01-06 DIAGNOSIS — Z170 Estrogen receptor positive status [ER+]: Secondary | ICD-10-CM | POA: Diagnosis not present

## 2021-01-06 DIAGNOSIS — C7951 Secondary malignant neoplasm of bone: Secondary | ICD-10-CM | POA: Diagnosis not present

## 2021-01-06 DIAGNOSIS — C50811 Malignant neoplasm of overlapping sites of right female breast: Secondary | ICD-10-CM | POA: Diagnosis not present

## 2021-01-06 DIAGNOSIS — Z5112 Encounter for antineoplastic immunotherapy: Secondary | ICD-10-CM | POA: Diagnosis not present

## 2021-01-07 DIAGNOSIS — C50811 Malignant neoplasm of overlapping sites of right female breast: Secondary | ICD-10-CM | POA: Diagnosis not present

## 2021-01-07 DIAGNOSIS — Z170 Estrogen receptor positive status [ER+]: Secondary | ICD-10-CM | POA: Diagnosis not present

## 2021-01-07 DIAGNOSIS — C787 Secondary malignant neoplasm of liver and intrahepatic bile duct: Secondary | ICD-10-CM | POA: Diagnosis not present

## 2021-01-07 DIAGNOSIS — C7951 Secondary malignant neoplasm of bone: Secondary | ICD-10-CM | POA: Diagnosis not present

## 2021-01-14 ENCOUNTER — Telehealth: Payer: Self-pay

## 2021-01-14 ENCOUNTER — Encounter: Payer: Self-pay | Admitting: Family Medicine

## 2021-01-14 NOTE — Telephone Encounter (Signed)
Pt called stating she's been binge eating.   Had 3/4 of a gallon of ice cream for dinner. Cookies, chips, and ice cream for lunch today.   States usually her post-prandial CBG = 140 today 232.   Advised patient to drink lots of water, take her meds as scheduled, and recheck CBG later (including tomorrow).

## 2021-01-14 NOTE — Telephone Encounter (Signed)
Agree with advice given

## 2021-01-19 ENCOUNTER — Encounter: Payer: Self-pay | Admitting: Family Medicine

## 2021-01-19 DIAGNOSIS — C50811 Malignant neoplasm of overlapping sites of right female breast: Secondary | ICD-10-CM | POA: Diagnosis not present

## 2021-01-19 DIAGNOSIS — C7951 Secondary malignant neoplasm of bone: Secondary | ICD-10-CM | POA: Diagnosis not present

## 2021-01-19 DIAGNOSIS — Z170 Estrogen receptor positive status [ER+]: Secondary | ICD-10-CM | POA: Diagnosis not present

## 2021-01-19 DIAGNOSIS — C787 Secondary malignant neoplasm of liver and intrahepatic bile duct: Secondary | ICD-10-CM | POA: Diagnosis not present

## 2021-01-20 DIAGNOSIS — Z170 Estrogen receptor positive status [ER+]: Secondary | ICD-10-CM | POA: Diagnosis not present

## 2021-01-20 DIAGNOSIS — R7989 Other specified abnormal findings of blood chemistry: Secondary | ICD-10-CM | POA: Diagnosis not present

## 2021-01-20 DIAGNOSIS — C787 Secondary malignant neoplasm of liver and intrahepatic bile duct: Secondary | ICD-10-CM | POA: Diagnosis not present

## 2021-01-20 DIAGNOSIS — Z87898 Personal history of other specified conditions: Secondary | ICD-10-CM | POA: Diagnosis not present

## 2021-01-20 DIAGNOSIS — Z5111 Encounter for antineoplastic chemotherapy: Secondary | ICD-10-CM | POA: Diagnosis not present

## 2021-01-20 DIAGNOSIS — I10 Essential (primary) hypertension: Secondary | ICD-10-CM | POA: Diagnosis not present

## 2021-01-20 DIAGNOSIS — R945 Abnormal results of liver function studies: Secondary | ICD-10-CM | POA: Diagnosis not present

## 2021-01-20 DIAGNOSIS — C50811 Malignant neoplasm of overlapping sites of right female breast: Secondary | ICD-10-CM | POA: Diagnosis not present

## 2021-01-20 DIAGNOSIS — C7951 Secondary malignant neoplasm of bone: Secondary | ICD-10-CM | POA: Diagnosis not present

## 2021-01-26 DIAGNOSIS — M899 Disorder of bone, unspecified: Secondary | ICD-10-CM | POA: Diagnosis not present

## 2021-01-26 DIAGNOSIS — Z203 Contact with and (suspected) exposure to rabies: Secondary | ICD-10-CM | POA: Diagnosis not present

## 2021-01-26 DIAGNOSIS — C787 Secondary malignant neoplasm of liver and intrahepatic bile duct: Secondary | ICD-10-CM | POA: Diagnosis not present

## 2021-01-26 DIAGNOSIS — Z9189 Other specified personal risk factors, not elsewhere classified: Secondary | ICD-10-CM | POA: Diagnosis not present

## 2021-01-26 DIAGNOSIS — C7951 Secondary malignant neoplasm of bone: Secondary | ICD-10-CM | POA: Diagnosis not present

## 2021-01-26 DIAGNOSIS — C50811 Malignant neoplasm of overlapping sites of right female breast: Secondary | ICD-10-CM | POA: Diagnosis not present

## 2021-01-26 DIAGNOSIS — Z170 Estrogen receptor positive status [ER+]: Secondary | ICD-10-CM | POA: Diagnosis not present

## 2021-01-27 DIAGNOSIS — C50811 Malignant neoplasm of overlapping sites of right female breast: Secondary | ICD-10-CM | POA: Diagnosis not present

## 2021-01-27 DIAGNOSIS — C787 Secondary malignant neoplasm of liver and intrahepatic bile duct: Secondary | ICD-10-CM | POA: Diagnosis not present

## 2021-01-27 DIAGNOSIS — C7951 Secondary malignant neoplasm of bone: Secondary | ICD-10-CM | POA: Diagnosis not present

## 2021-01-27 DIAGNOSIS — Z5112 Encounter for antineoplastic immunotherapy: Secondary | ICD-10-CM | POA: Diagnosis not present

## 2021-01-27 DIAGNOSIS — Z170 Estrogen receptor positive status [ER+]: Secondary | ICD-10-CM | POA: Diagnosis not present

## 2021-01-28 DIAGNOSIS — C50811 Malignant neoplasm of overlapping sites of right female breast: Secondary | ICD-10-CM | POA: Diagnosis not present

## 2021-01-28 DIAGNOSIS — C7951 Secondary malignant neoplasm of bone: Secondary | ICD-10-CM | POA: Diagnosis not present

## 2021-01-28 DIAGNOSIS — Z170 Estrogen receptor positive status [ER+]: Secondary | ICD-10-CM | POA: Diagnosis not present

## 2021-01-28 DIAGNOSIS — C787 Secondary malignant neoplasm of liver and intrahepatic bile duct: Secondary | ICD-10-CM | POA: Diagnosis not present

## 2021-02-04 ENCOUNTER — Ambulatory Visit: Payer: Medicare Other | Admitting: Podiatry

## 2021-02-04 ENCOUNTER — Encounter: Payer: Self-pay | Admitting: Family Medicine

## 2021-02-09 DIAGNOSIS — C7951 Secondary malignant neoplasm of bone: Secondary | ICD-10-CM | POA: Diagnosis not present

## 2021-02-09 DIAGNOSIS — Z170 Estrogen receptor positive status [ER+]: Secondary | ICD-10-CM | POA: Diagnosis not present

## 2021-02-09 DIAGNOSIS — C50811 Malignant neoplasm of overlapping sites of right female breast: Secondary | ICD-10-CM | POA: Diagnosis not present

## 2021-02-09 DIAGNOSIS — C787 Secondary malignant neoplasm of liver and intrahepatic bile duct: Secondary | ICD-10-CM | POA: Diagnosis not present

## 2021-02-09 NOTE — Telephone Encounter (Signed)
[employer]          Reviewed the scan document for the fax number but there is no fax number listed.   Called office at [phone], but the office was closed until Monday July 18th and normal hours are Monday to Thursday in the morning only.

## 2021-02-10 ENCOUNTER — Encounter: Payer: Self-pay | Admitting: Family Medicine

## 2021-02-10 DIAGNOSIS — Z7952 Long term (current) use of systemic steroids: Secondary | ICD-10-CM | POA: Diagnosis not present

## 2021-02-10 DIAGNOSIS — R7989 Other specified abnormal findings of blood chemistry: Secondary | ICD-10-CM | POA: Diagnosis not present

## 2021-02-10 DIAGNOSIS — Z79899 Other long term (current) drug therapy: Secondary | ICD-10-CM | POA: Diagnosis not present

## 2021-02-10 DIAGNOSIS — Z5112 Encounter for antineoplastic immunotherapy: Secondary | ICD-10-CM | POA: Diagnosis not present

## 2021-02-10 DIAGNOSIS — C7951 Secondary malignant neoplasm of bone: Secondary | ICD-10-CM | POA: Diagnosis not present

## 2021-02-10 DIAGNOSIS — Z170 Estrogen receptor positive status [ER+]: Secondary | ICD-10-CM | POA: Diagnosis not present

## 2021-02-10 DIAGNOSIS — Z5181 Encounter for therapeutic drug level monitoring: Secondary | ICD-10-CM | POA: Diagnosis not present

## 2021-02-10 DIAGNOSIS — C787 Secondary malignant neoplasm of liver and intrahepatic bile duct: Secondary | ICD-10-CM | POA: Diagnosis not present

## 2021-02-10 DIAGNOSIS — C50811 Malignant neoplasm of overlapping sites of right female breast: Secondary | ICD-10-CM | POA: Diagnosis not present

## 2021-02-10 DIAGNOSIS — T451X5A Adverse effect of antineoplastic and immunosuppressive drugs, initial encounter: Secondary | ICD-10-CM | POA: Diagnosis not present

## 2021-02-10 DIAGNOSIS — D6481 Anemia due to antineoplastic chemotherapy: Secondary | ICD-10-CM | POA: Diagnosis not present

## 2021-02-14 ENCOUNTER — Ambulatory Visit: Payer: Medicare Other | Admitting: Medical-Surgical

## 2021-02-16 DIAGNOSIS — C787 Secondary malignant neoplasm of liver and intrahepatic bile duct: Secondary | ICD-10-CM | POA: Diagnosis not present

## 2021-02-16 DIAGNOSIS — C50811 Malignant neoplasm of overlapping sites of right female breast: Secondary | ICD-10-CM | POA: Diagnosis not present

## 2021-02-16 DIAGNOSIS — Z170 Estrogen receptor positive status [ER+]: Secondary | ICD-10-CM | POA: Diagnosis not present

## 2021-02-16 DIAGNOSIS — C7951 Secondary malignant neoplasm of bone: Secondary | ICD-10-CM | POA: Diagnosis not present

## 2021-02-17 DIAGNOSIS — C787 Secondary malignant neoplasm of liver and intrahepatic bile duct: Secondary | ICD-10-CM | POA: Diagnosis not present

## 2021-02-17 DIAGNOSIS — Z5111 Encounter for antineoplastic chemotherapy: Secondary | ICD-10-CM | POA: Diagnosis not present

## 2021-02-17 DIAGNOSIS — Z170 Estrogen receptor positive status [ER+]: Secondary | ICD-10-CM | POA: Diagnosis not present

## 2021-02-17 DIAGNOSIS — C50811 Malignant neoplasm of overlapping sites of right female breast: Secondary | ICD-10-CM | POA: Diagnosis not present

## 2021-02-17 DIAGNOSIS — C7951 Secondary malignant neoplasm of bone: Secondary | ICD-10-CM | POA: Diagnosis not present

## 2021-02-18 ENCOUNTER — Ambulatory Visit: Payer: Medicare Other | Admitting: Podiatry

## 2021-02-18 DIAGNOSIS — Z170 Estrogen receptor positive status [ER+]: Secondary | ICD-10-CM | POA: Diagnosis not present

## 2021-02-18 DIAGNOSIS — C7951 Secondary malignant neoplasm of bone: Secondary | ICD-10-CM | POA: Diagnosis not present

## 2021-02-18 DIAGNOSIS — C787 Secondary malignant neoplasm of liver and intrahepatic bile duct: Secondary | ICD-10-CM | POA: Diagnosis not present

## 2021-02-18 DIAGNOSIS — C50811 Malignant neoplasm of overlapping sites of right female breast: Secondary | ICD-10-CM | POA: Diagnosis not present

## 2021-03-01 ENCOUNTER — Encounter: Payer: Self-pay | Admitting: Family Medicine

## 2021-03-01 NOTE — Telephone Encounter (Signed)
LVM for office requesting fax number:

## 2021-03-02 DIAGNOSIS — C50811 Malignant neoplasm of overlapping sites of right female breast: Secondary | ICD-10-CM | POA: Diagnosis not present

## 2021-03-02 DIAGNOSIS — C787 Secondary malignant neoplasm of liver and intrahepatic bile duct: Secondary | ICD-10-CM | POA: Diagnosis not present

## 2021-03-02 DIAGNOSIS — C7951 Secondary malignant neoplasm of bone: Secondary | ICD-10-CM | POA: Diagnosis not present

## 2021-03-02 DIAGNOSIS — Z170 Estrogen receptor positive status [ER+]: Secondary | ICD-10-CM | POA: Diagnosis not present

## 2021-03-03 DIAGNOSIS — R7989 Other specified abnormal findings of blood chemistry: Secondary | ICD-10-CM | POA: Diagnosis not present

## 2021-03-03 DIAGNOSIS — C787 Secondary malignant neoplasm of liver and intrahepatic bile duct: Secondary | ICD-10-CM | POA: Diagnosis not present

## 2021-03-03 DIAGNOSIS — C7951 Secondary malignant neoplasm of bone: Secondary | ICD-10-CM | POA: Diagnosis not present

## 2021-03-03 DIAGNOSIS — Z5111 Encounter for antineoplastic chemotherapy: Secondary | ICD-10-CM | POA: Diagnosis not present

## 2021-03-03 DIAGNOSIS — Z170 Estrogen receptor positive status [ER+]: Secondary | ICD-10-CM | POA: Diagnosis not present

## 2021-03-03 DIAGNOSIS — R945 Abnormal results of liver function studies: Secondary | ICD-10-CM | POA: Diagnosis not present

## 2021-03-03 DIAGNOSIS — C50811 Malignant neoplasm of overlapping sites of right female breast: Secondary | ICD-10-CM | POA: Diagnosis not present

## 2021-03-03 DIAGNOSIS — K2970 Gastritis, unspecified, without bleeding: Secondary | ICD-10-CM | POA: Diagnosis not present

## 2021-03-03 DIAGNOSIS — Z95828 Presence of other vascular implants and grafts: Secondary | ICD-10-CM | POA: Diagnosis not present

## 2021-03-03 NOTE — Telephone Encounter (Signed)
Efax communication sent to [facility] for most recent eye exam documentation.

## 2021-03-04 ENCOUNTER — Ambulatory Visit: Payer: Medicare Other | Admitting: Podiatry

## 2021-03-09 DIAGNOSIS — C50811 Malignant neoplasm of overlapping sites of right female breast: Secondary | ICD-10-CM | POA: Diagnosis not present

## 2021-03-09 DIAGNOSIS — C7951 Secondary malignant neoplasm of bone: Secondary | ICD-10-CM | POA: Diagnosis not present

## 2021-03-09 DIAGNOSIS — C787 Secondary malignant neoplasm of liver and intrahepatic bile duct: Secondary | ICD-10-CM | POA: Diagnosis not present

## 2021-03-09 DIAGNOSIS — Z170 Estrogen receptor positive status [ER+]: Secondary | ICD-10-CM | POA: Diagnosis not present

## 2021-03-10 ENCOUNTER — Encounter: Payer: Self-pay | Admitting: Family Medicine

## 2021-03-10 DIAGNOSIS — C787 Secondary malignant neoplasm of liver and intrahepatic bile duct: Secondary | ICD-10-CM | POA: Diagnosis not present

## 2021-03-10 DIAGNOSIS — Z170 Estrogen receptor positive status [ER+]: Secondary | ICD-10-CM | POA: Diagnosis not present

## 2021-03-10 DIAGNOSIS — C50811 Malignant neoplasm of overlapping sites of right female breast: Secondary | ICD-10-CM | POA: Diagnosis not present

## 2021-03-10 DIAGNOSIS — C7951 Secondary malignant neoplasm of bone: Secondary | ICD-10-CM | POA: Diagnosis not present

## 2021-03-10 DIAGNOSIS — Z5111 Encounter for antineoplastic chemotherapy: Secondary | ICD-10-CM | POA: Diagnosis not present

## 2021-03-11 DIAGNOSIS — C787 Secondary malignant neoplasm of liver and intrahepatic bile duct: Secondary | ICD-10-CM | POA: Diagnosis not present

## 2021-03-11 DIAGNOSIS — Z170 Estrogen receptor positive status [ER+]: Secondary | ICD-10-CM | POA: Diagnosis not present

## 2021-03-11 DIAGNOSIS — C7951 Secondary malignant neoplasm of bone: Secondary | ICD-10-CM | POA: Diagnosis not present

## 2021-03-11 DIAGNOSIS — C50811 Malignant neoplasm of overlapping sites of right female breast: Secondary | ICD-10-CM | POA: Diagnosis not present

## 2021-03-18 ENCOUNTER — Ambulatory Visit: Payer: Medicare Other | Admitting: Podiatry

## 2021-03-18 DIAGNOSIS — C787 Secondary malignant neoplasm of liver and intrahepatic bile duct: Secondary | ICD-10-CM | POA: Diagnosis not present

## 2021-03-18 DIAGNOSIS — Z170 Estrogen receptor positive status [ER+]: Secondary | ICD-10-CM | POA: Diagnosis not present

## 2021-03-18 DIAGNOSIS — R918 Other nonspecific abnormal finding of lung field: Secondary | ICD-10-CM | POA: Diagnosis not present

## 2021-03-18 DIAGNOSIS — C7951 Secondary malignant neoplasm of bone: Secondary | ICD-10-CM | POA: Diagnosis not present

## 2021-03-18 DIAGNOSIS — C50811 Malignant neoplasm of overlapping sites of right female breast: Secondary | ICD-10-CM | POA: Diagnosis not present

## 2021-03-19 ENCOUNTER — Encounter: Payer: Self-pay | Admitting: Family Medicine

## 2021-03-23 DIAGNOSIS — C787 Secondary malignant neoplasm of liver and intrahepatic bile duct: Secondary | ICD-10-CM | POA: Diagnosis not present

## 2021-03-23 DIAGNOSIS — C7951 Secondary malignant neoplasm of bone: Secondary | ICD-10-CM | POA: Diagnosis not present

## 2021-03-23 DIAGNOSIS — Z170 Estrogen receptor positive status [ER+]: Secondary | ICD-10-CM | POA: Diagnosis not present

## 2021-03-23 DIAGNOSIS — C50811 Malignant neoplasm of overlapping sites of right female breast: Secondary | ICD-10-CM | POA: Diagnosis not present

## 2021-03-24 DIAGNOSIS — C7951 Secondary malignant neoplasm of bone: Secondary | ICD-10-CM | POA: Diagnosis not present

## 2021-03-24 DIAGNOSIS — Z5112 Encounter for antineoplastic immunotherapy: Secondary | ICD-10-CM | POA: Diagnosis not present

## 2021-03-24 DIAGNOSIS — I10 Essential (primary) hypertension: Secondary | ICD-10-CM | POA: Diagnosis not present

## 2021-03-24 DIAGNOSIS — Z170 Estrogen receptor positive status [ER+]: Secondary | ICD-10-CM | POA: Diagnosis not present

## 2021-03-24 DIAGNOSIS — C50811 Malignant neoplasm of overlapping sites of right female breast: Secondary | ICD-10-CM | POA: Diagnosis not present

## 2021-03-24 DIAGNOSIS — C787 Secondary malignant neoplasm of liver and intrahepatic bile duct: Secondary | ICD-10-CM | POA: Diagnosis not present

## 2021-03-25 ENCOUNTER — Encounter: Payer: Self-pay | Admitting: Family Medicine

## 2021-03-30 DIAGNOSIS — C787 Secondary malignant neoplasm of liver and intrahepatic bile duct: Secondary | ICD-10-CM | POA: Diagnosis not present

## 2021-03-30 DIAGNOSIS — C50811 Malignant neoplasm of overlapping sites of right female breast: Secondary | ICD-10-CM | POA: Diagnosis not present

## 2021-03-30 DIAGNOSIS — C7951 Secondary malignant neoplasm of bone: Secondary | ICD-10-CM | POA: Diagnosis not present

## 2021-03-30 DIAGNOSIS — Z170 Estrogen receptor positive status [ER+]: Secondary | ICD-10-CM | POA: Diagnosis not present

## 2021-03-31 DIAGNOSIS — Z882 Allergy status to sulfonamides status: Secondary | ICD-10-CM | POA: Diagnosis not present

## 2021-03-31 DIAGNOSIS — C7951 Secondary malignant neoplasm of bone: Secondary | ICD-10-CM | POA: Diagnosis not present

## 2021-03-31 DIAGNOSIS — Z5112 Encounter for antineoplastic immunotherapy: Secondary | ICD-10-CM | POA: Diagnosis not present

## 2021-03-31 DIAGNOSIS — Z20822 Contact with and (suspected) exposure to covid-19: Secondary | ICD-10-CM | POA: Diagnosis not present

## 2021-03-31 DIAGNOSIS — Z880 Allergy status to penicillin: Secondary | ICD-10-CM | POA: Diagnosis not present

## 2021-03-31 DIAGNOSIS — Z170 Estrogen receptor positive status [ER+]: Secondary | ICD-10-CM | POA: Diagnosis not present

## 2021-03-31 DIAGNOSIS — Z79899 Other long term (current) drug therapy: Secondary | ICD-10-CM | POA: Diagnosis not present

## 2021-03-31 DIAGNOSIS — C787 Secondary malignant neoplasm of liver and intrahepatic bile duct: Secondary | ICD-10-CM | POA: Diagnosis not present

## 2021-03-31 DIAGNOSIS — R21 Rash and other nonspecific skin eruption: Secondary | ICD-10-CM | POA: Diagnosis not present

## 2021-03-31 DIAGNOSIS — R509 Fever, unspecified: Secondary | ICD-10-CM | POA: Diagnosis not present

## 2021-03-31 DIAGNOSIS — C50811 Malignant neoplasm of overlapping sites of right female breast: Secondary | ICD-10-CM | POA: Diagnosis not present

## 2021-03-31 DIAGNOSIS — R0602 Shortness of breath: Secondary | ICD-10-CM | POA: Diagnosis not present

## 2021-03-31 DIAGNOSIS — Z888 Allergy status to other drugs, medicaments and biological substances status: Secondary | ICD-10-CM | POA: Diagnosis not present

## 2021-03-31 DIAGNOSIS — Z7982 Long term (current) use of aspirin: Secondary | ICD-10-CM | POA: Diagnosis not present

## 2021-04-01 DIAGNOSIS — C7951 Secondary malignant neoplasm of bone: Secondary | ICD-10-CM | POA: Diagnosis not present

## 2021-04-01 DIAGNOSIS — C787 Secondary malignant neoplasm of liver and intrahepatic bile duct: Secondary | ICD-10-CM | POA: Diagnosis not present

## 2021-04-01 DIAGNOSIS — C50811 Malignant neoplasm of overlapping sites of right female breast: Secondary | ICD-10-CM | POA: Diagnosis not present

## 2021-04-01 DIAGNOSIS — Z170 Estrogen receptor positive status [ER+]: Secondary | ICD-10-CM | POA: Diagnosis not present

## 2021-04-06 ENCOUNTER — Encounter: Payer: Self-pay | Admitting: Family Medicine

## 2021-04-10 ENCOUNTER — Encounter: Payer: Self-pay | Admitting: Family Medicine

## 2021-04-13 ENCOUNTER — Ambulatory Visit: Payer: Medicare Other | Admitting: Sports Medicine

## 2021-04-13 DIAGNOSIS — Z170 Estrogen receptor positive status [ER+]: Secondary | ICD-10-CM | POA: Diagnosis not present

## 2021-04-13 DIAGNOSIS — C7951 Secondary malignant neoplasm of bone: Secondary | ICD-10-CM | POA: Diagnosis not present

## 2021-04-13 DIAGNOSIS — C787 Secondary malignant neoplasm of liver and intrahepatic bile duct: Secondary | ICD-10-CM | POA: Diagnosis not present

## 2021-04-13 DIAGNOSIS — C50811 Malignant neoplasm of overlapping sites of right female breast: Secondary | ICD-10-CM | POA: Diagnosis not present

## 2021-04-14 DIAGNOSIS — T451X5A Adverse effect of antineoplastic and immunosuppressive drugs, initial encounter: Secondary | ICD-10-CM | POA: Diagnosis not present

## 2021-04-14 DIAGNOSIS — R7989 Other specified abnormal findings of blood chemistry: Secondary | ICD-10-CM | POA: Diagnosis not present

## 2021-04-14 DIAGNOSIS — C7951 Secondary malignant neoplasm of bone: Secondary | ICD-10-CM | POA: Diagnosis not present

## 2021-04-14 DIAGNOSIS — Z170 Estrogen receptor positive status [ER+]: Secondary | ICD-10-CM | POA: Diagnosis not present

## 2021-04-14 DIAGNOSIS — I10 Essential (primary) hypertension: Secondary | ICD-10-CM | POA: Diagnosis not present

## 2021-04-14 DIAGNOSIS — Z9189 Other specified personal risk factors, not elsewhere classified: Secondary | ICD-10-CM | POA: Diagnosis not present

## 2021-04-14 DIAGNOSIS — D701 Agranulocytosis secondary to cancer chemotherapy: Secondary | ICD-10-CM | POA: Diagnosis not present

## 2021-04-14 DIAGNOSIS — R945 Abnormal results of liver function studies: Secondary | ICD-10-CM | POA: Diagnosis not present

## 2021-04-14 DIAGNOSIS — Z95828 Presence of other vascular implants and grafts: Secondary | ICD-10-CM | POA: Diagnosis not present

## 2021-04-14 DIAGNOSIS — C50811 Malignant neoplasm of overlapping sites of right female breast: Secondary | ICD-10-CM | POA: Diagnosis not present

## 2021-04-14 DIAGNOSIS — Z5111 Encounter for antineoplastic chemotherapy: Secondary | ICD-10-CM | POA: Diagnosis not present

## 2021-04-14 DIAGNOSIS — C787 Secondary malignant neoplasm of liver and intrahepatic bile duct: Secondary | ICD-10-CM | POA: Diagnosis not present

## 2021-04-15 ENCOUNTER — Ambulatory Visit: Payer: Medicare Other | Admitting: Sports Medicine

## 2021-04-18 ENCOUNTER — Encounter: Payer: Self-pay | Admitting: Family Medicine

## 2021-04-18 ENCOUNTER — Other Ambulatory Visit: Payer: Self-pay

## 2021-04-18 ENCOUNTER — Ambulatory Visit (INDEPENDENT_AMBULATORY_CARE_PROVIDER_SITE_OTHER): Payer: Medicare Other | Admitting: Family Medicine

## 2021-04-18 VITALS — BP 125/67 | HR 80 | Temp 97.3°F | Ht 64.5 in | Wt 179.9 lb

## 2021-04-18 DIAGNOSIS — E1169 Type 2 diabetes mellitus with other specified complication: Secondary | ICD-10-CM

## 2021-04-18 DIAGNOSIS — C50919 Malignant neoplasm of unspecified site of unspecified female breast: Secondary | ICD-10-CM | POA: Diagnosis not present

## 2021-04-18 DIAGNOSIS — R7303 Prediabetes: Secondary | ICD-10-CM | POA: Diagnosis not present

## 2021-04-18 DIAGNOSIS — I10 Essential (primary) hypertension: Secondary | ICD-10-CM

## 2021-04-18 DIAGNOSIS — F331 Major depressive disorder, recurrent, moderate: Secondary | ICD-10-CM

## 2021-04-18 LAB — POCT GLYCOSYLATED HEMOGLOBIN (HGB A1C): Hemoglobin A1C: 6.5 % — AB (ref 4.0–5.6)

## 2021-04-18 NOTE — Patient Instructions (Signed)
A1c continues to look pretty good. You may want to re-start your citalopram.  Hang in there! See me again in 6 months.

## 2021-04-19 ENCOUNTER — Encounter: Payer: Self-pay | Admitting: Family Medicine

## 2021-04-19 ENCOUNTER — Ambulatory Visit: Payer: Medicare Other | Admitting: Family Medicine

## 2021-04-20 DIAGNOSIS — Z170 Estrogen receptor positive status [ER+]: Secondary | ICD-10-CM | POA: Diagnosis not present

## 2021-04-20 DIAGNOSIS — C50811 Malignant neoplasm of overlapping sites of right female breast: Secondary | ICD-10-CM | POA: Diagnosis not present

## 2021-04-20 DIAGNOSIS — C7951 Secondary malignant neoplasm of bone: Secondary | ICD-10-CM | POA: Diagnosis not present

## 2021-04-20 DIAGNOSIS — C787 Secondary malignant neoplasm of liver and intrahepatic bile duct: Secondary | ICD-10-CM | POA: Diagnosis not present

## 2021-04-21 DIAGNOSIS — Z5112 Encounter for antineoplastic immunotherapy: Secondary | ICD-10-CM | POA: Diagnosis not present

## 2021-04-21 DIAGNOSIS — C7951 Secondary malignant neoplasm of bone: Secondary | ICD-10-CM | POA: Diagnosis not present

## 2021-04-21 DIAGNOSIS — C787 Secondary malignant neoplasm of liver and intrahepatic bile duct: Secondary | ICD-10-CM | POA: Diagnosis not present

## 2021-04-21 DIAGNOSIS — Z170 Estrogen receptor positive status [ER+]: Secondary | ICD-10-CM | POA: Diagnosis not present

## 2021-04-21 DIAGNOSIS — C50811 Malignant neoplasm of overlapping sites of right female breast: Secondary | ICD-10-CM | POA: Diagnosis not present

## 2021-04-22 ENCOUNTER — Ambulatory Visit: Payer: Medicare Other | Admitting: Podiatry

## 2021-04-22 DIAGNOSIS — Z170 Estrogen receptor positive status [ER+]: Secondary | ICD-10-CM | POA: Diagnosis not present

## 2021-04-22 DIAGNOSIS — C50811 Malignant neoplasm of overlapping sites of right female breast: Secondary | ICD-10-CM | POA: Diagnosis not present

## 2021-04-22 DIAGNOSIS — C787 Secondary malignant neoplasm of liver and intrahepatic bile duct: Secondary | ICD-10-CM | POA: Diagnosis not present

## 2021-04-22 DIAGNOSIS — C7951 Secondary malignant neoplasm of bone: Secondary | ICD-10-CM | POA: Diagnosis not present

## 2021-04-24 NOTE — Assessment & Plan Note (Signed)
Updating A1c today. ?

## 2021-04-24 NOTE — Assessment & Plan Note (Signed)
She has had some increasing depressive symptoms recently.  She is considering restarting citalopram.  She still has plenty of this prescription at home.

## 2021-04-24 NOTE — Assessment & Plan Note (Signed)
She continues to receive treatment for stage IV breast cancer.  She has had some response to this treatment but is not entirely realistic in her prognosis.  We discussed considering filing for disability given her continued difficulty with work.  She will check into this.

## 2021-04-24 NOTE — Assessment & Plan Note (Signed)
Blood pressure remains well controlled without medication.

## 2021-04-24 NOTE — Progress Notes (Signed)
[name] - 65 y.o. female MRN [id number]  Date of birth: [birth year]  Subjective Chief Complaint  Patient presents with   Follow-up    HPI [name] is a 65 year old female here today for follow-up visit.  She continues to receive [facility] for stage IV breast cancer.  She is tolerating treatment well and is having fairly good response to this so far.  She remains in denial and unrealistic regarding the extent of her disease.  Her oncologist discussed applying for disability with her which I also discussed with her today.  She does continue to have some sciatic pain as well.  These have improved some with stretching exercises.  She is checking her blood sugars regularly with readings between 120 and 140.  She denies symptoms of hypoglycemia.  She has discontinued lisinopril due to episodes of hypotension.  She does feel better since discontinuing this.   ROS:  A comprehensive ROS was completed and negative except as noted per HPI    Allergies  Allergen Reactions   Penicillins Shortness Of Breath    Reaction as a child Did it involve swelling of the face/tongue/throat, SOB, or low BP? Yes Did it involve sudden or severe rash/hives, skin peeling, or any reaction on the inside of your mouth or nose?Unknown Did you need to seek medical attention at a hospital or doctor's office? Yes When did it last happen? Childhood reaction.    If all above answers are "NO", may proceed with cephalosporin use.    Sulfa Antibiotics Nausea Only   Effexor [Venlafaxine] Other (See Comments)    "dopey"    Past Medical History:  Diagnosis Date   Anxiety    Asthma    Breast cancer ([place])    Chest pain, atypical    Closed head injury 06/06/2017   Depression    Heart palpitations    hx of MVP   Hypertension    IBS (irritable bowel syndrome)    Pre-diabetes     Past Surgical History:  Procedure Laterality Date   BREAST SURGERY  06/2018   right and left breast   eAB     x2     MASTECTOMY MODIFIED RADICAL Bilateral 10/10/2018   Procedure: RIGHT MODIFIED RADICAL MASTECTOMY AND LEFT PROPHYLATIC MASTECTOMY;  Surgeon: [name], MD;  Location: [facility];  Service: General;  Laterality: Bilateral;   PORTACATH PLACEMENT Left 08/12/2018   Procedure: INSERTION PORT-A-CATH;  Surgeon: [name], MD;  Location: [facility];  Service: General;  Laterality: Left;    Social History   Socioeconomic History   Marital status: Divorced    Spouse name: Not on file   Number of children: Not on file   Years of education: Not on file   Highest education level: Not on file  Occupational History   Not on file  Tobacco Use   Smoking status: Never   Smokeless tobacco: Never   Tobacco comments:    nonsmoker  Vaping Use   Vaping Use: Never used  Substance and Sexual Activity   Alcohol use: No   Drug use: No   Sexual activity: Not Currently    Birth control/protection: None  Other Topics Concern   Not on file  Social History Narrative   Divorced; grown son lives in [place].    [facility] [employer] [facility]- PCP   Social Determinants of Health   Financial Resource Strain: Not on file  Food Insecurity: Not on file  Transportation Needs: Not on file  Physical Activity: Not on file  Stress: Not on file  Social Connections: Not on file    Family History  Problem Relation Age of Onset   Heart attack Father    Lung cancer Mother    Bladder Cancer Brother     Health Maintenance  Topic Date Due   Zoster Vaccines- Shingrix (1 of 2) Never done   COLONOSCOPY (Pts 45-41yrs Insurance coverage will need to be confirmed)  Never done   URINE MICROALBUMIN  04/24/2015   DEXA SCAN  Never done   OPHTHALMOLOGY EXAM  11/03/2020   INFLUENZA VACCINE  Never done   HEMOGLOBIN A1C  10/16/2021   FOOT EXAM  04/18/2022   TETANUS/TDAP  03/28/2028   Hepatitis C Screening  Completed   HPV VACCINES  Aged Out   MAMMOGRAM  Discontinued   COVID-19 Vaccine  Discontinued      ----------------------------------------------------------------------------------------------------------------------------------------------------------------------------------------------------------------- Physical Exam BP 125/67 (BP Location: Left Arm, Patient Position: Sitting, Cuff Size: Normal)   Pulse 80   Temp (!) 97.3 F (36.3 C)   Ht 5' 4.5" (1.638 m)   Wt 179 lb 14.4 oz (81.6 kg)   SpO2 97%   BMI 30.40 kg/m   Physical Exam Constitutional:      Appearance: Normal appearance.  HENT:     Head: Normocephalic and atraumatic.  Eyes:     General: No scleral icterus. Cardiovascular:     Rate and Rhythm: Normal rate and regular rhythm.  Musculoskeletal:     Cervical back: Neck supple.  Neurological:     General: No focal deficit present.     Mental Status: She is alert.  Psychiatric:        Mood and Affect: Mood normal.        Behavior: Behavior normal.    ------------------------------------------------------------------------------------------------------------------------------------------------------------------------------------------------------------------- Assessment and Plan  Stage IV breast cancer in female [facility]) She continues to receive treatment for stage IV breast cancer.  She has had some response to this treatment but is not entirely realistic in her prognosis.  We discussed considering filing for disability given her continued difficulty with work.  She will check into this.  Prediabetes Updating A1c today.  Moderate episode of recurrent major depressive disorder ([place]) She has had some increasing depressive symptoms recently.  She is considering restarting citalopram.  She still has plenty of this prescription at home.  Essential hypertension Blood pressure remains well controlled without medication.   No orders of the defined types were placed in this encounter.   Return in about 6 months (around 10/16/2021) for HTN/DM.    This visit  occurred during the SARS-CoV-2 public health emergency.  Safety protocols were in place, including screening questions prior to the visit, additional usage of staff PPE, and extensive cleaning of exam room while observing appropriate contact time as indicated for disinfecting solutions.

## 2021-04-29 ENCOUNTER — Ambulatory Visit: Payer: Medicare Other | Admitting: Podiatry

## 2021-05-01 ENCOUNTER — Encounter: Payer: Self-pay | Admitting: Family Medicine

## 2021-05-04 ENCOUNTER — Encounter: Payer: Self-pay | Admitting: Family Medicine

## 2021-05-04 DIAGNOSIS — C787 Secondary malignant neoplasm of liver and intrahepatic bile duct: Secondary | ICD-10-CM | POA: Diagnosis not present

## 2021-05-04 DIAGNOSIS — C50811 Malignant neoplasm of overlapping sites of right female breast: Secondary | ICD-10-CM | POA: Diagnosis not present

## 2021-05-04 DIAGNOSIS — Z170 Estrogen receptor positive status [ER+]: Secondary | ICD-10-CM | POA: Diagnosis not present

## 2021-05-04 DIAGNOSIS — C7951 Secondary malignant neoplasm of bone: Secondary | ICD-10-CM | POA: Diagnosis not present

## 2021-05-05 DIAGNOSIS — C7951 Secondary malignant neoplasm of bone: Secondary | ICD-10-CM | POA: Diagnosis not present

## 2021-05-05 DIAGNOSIS — C50811 Malignant neoplasm of overlapping sites of right female breast: Secondary | ICD-10-CM | POA: Diagnosis not present

## 2021-05-05 DIAGNOSIS — I10 Essential (primary) hypertension: Secondary | ICD-10-CM | POA: Diagnosis not present

## 2021-05-05 DIAGNOSIS — M5441 Lumbago with sciatica, right side: Secondary | ICD-10-CM | POA: Diagnosis not present

## 2021-05-05 DIAGNOSIS — Z170 Estrogen receptor positive status [ER+]: Secondary | ICD-10-CM | POA: Diagnosis not present

## 2021-05-05 DIAGNOSIS — C787 Secondary malignant neoplasm of liver and intrahepatic bile duct: Secondary | ICD-10-CM | POA: Diagnosis not present

## 2021-05-05 DIAGNOSIS — Z5112 Encounter for antineoplastic immunotherapy: Secondary | ICD-10-CM | POA: Diagnosis not present

## 2021-05-05 DIAGNOSIS — Z5986 Financial insecurity: Secondary | ICD-10-CM | POA: Diagnosis not present

## 2021-05-06 ENCOUNTER — Ambulatory Visit: Payer: Medicare Other | Admitting: Podiatry

## 2021-05-11 DIAGNOSIS — Z170 Estrogen receptor positive status [ER+]: Secondary | ICD-10-CM | POA: Diagnosis not present

## 2021-05-11 DIAGNOSIS — C50811 Malignant neoplasm of overlapping sites of right female breast: Secondary | ICD-10-CM | POA: Diagnosis not present

## 2021-05-11 DIAGNOSIS — C7951 Secondary malignant neoplasm of bone: Secondary | ICD-10-CM | POA: Diagnosis not present

## 2021-05-11 DIAGNOSIS — C787 Secondary malignant neoplasm of liver and intrahepatic bile duct: Secondary | ICD-10-CM | POA: Diagnosis not present

## 2021-05-12 DIAGNOSIS — Z5112 Encounter for antineoplastic immunotherapy: Secondary | ICD-10-CM | POA: Diagnosis not present

## 2021-05-12 DIAGNOSIS — C50811 Malignant neoplasm of overlapping sites of right female breast: Secondary | ICD-10-CM | POA: Diagnosis not present

## 2021-05-12 DIAGNOSIS — C7951 Secondary malignant neoplasm of bone: Secondary | ICD-10-CM | POA: Diagnosis not present

## 2021-05-12 DIAGNOSIS — Z170 Estrogen receptor positive status [ER+]: Secondary | ICD-10-CM | POA: Diagnosis not present

## 2021-05-12 DIAGNOSIS — C787 Secondary malignant neoplasm of liver and intrahepatic bile duct: Secondary | ICD-10-CM | POA: Diagnosis not present

## 2021-05-13 ENCOUNTER — Ambulatory Visit: Payer: Medicare Other | Admitting: Podiatry

## 2021-05-13 DIAGNOSIS — T451X5A Adverse effect of antineoplastic and immunosuppressive drugs, initial encounter: Secondary | ICD-10-CM | POA: Diagnosis not present

## 2021-05-13 DIAGNOSIS — D701 Agranulocytosis secondary to cancer chemotherapy: Secondary | ICD-10-CM | POA: Diagnosis not present

## 2021-05-13 DIAGNOSIS — C787 Secondary malignant neoplasm of liver and intrahepatic bile duct: Secondary | ICD-10-CM | POA: Diagnosis not present

## 2021-05-13 DIAGNOSIS — C50811 Malignant neoplasm of overlapping sites of right female breast: Secondary | ICD-10-CM | POA: Diagnosis not present

## 2021-05-13 DIAGNOSIS — C7951 Secondary malignant neoplasm of bone: Secondary | ICD-10-CM | POA: Diagnosis not present

## 2021-05-13 DIAGNOSIS — Z170 Estrogen receptor positive status [ER+]: Secondary | ICD-10-CM | POA: Diagnosis not present

## 2021-05-19 ENCOUNTER — Ambulatory Visit: Payer: Medicare Other | Admitting: Podiatry

## 2021-05-20 ENCOUNTER — Other Ambulatory Visit: Payer: Self-pay

## 2021-05-20 ENCOUNTER — Ambulatory Visit: Payer: Medicare Other | Admitting: Podiatry

## 2021-05-20 ENCOUNTER — Encounter: Payer: Self-pay | Admitting: Podiatry

## 2021-05-20 DIAGNOSIS — Q828 Other specified congenital malformations of skin: Secondary | ICD-10-CM

## 2021-05-20 NOTE — Progress Notes (Signed)
  Subjective:  Patient ID: [name], female    DOB: [birth year],   MRN: [id number]  Chief Complaint  Patient presents with   Plantar Warts    I have a spot on the ball of the left foot and it is nagging and hurts some     65 y.o. female presents for follow up of lesion on the bottom of her left foot. States it still hurts and believes it has come back. She is a controlled diabetic.  Marland Kitchen Denies any other pedal complaints. Denies n/v/f/c.   Past Medical History:  Diagnosis Date   Anxiety    Asthma    Breast cancer ([place])    Chest pain, atypical    Closed head injury 06/06/2017   Depression    Heart palpitations    hx of MVP   Hypertension    IBS (irritable bowel syndrome)    Pre-diabetes     Objective:  Physical Exam: Vascular: DP/PT pulses 2/4 bilateral. CFT <3 seconds. Normal hair growth on digits. No edema.  Skin. No lacerations or abrasions bilateral feet. Hyperkeratotic cored lesion sub 4th metatarsal head.  Musculoskeletal: MMT 5/5 bilateral lower extremities in DF, PF, Inversion and Eversion. Deceased ROM in DF of ankle joint.  Neurological: Sensation intact to light touch.   Assessment:   1. Porokeratosis      Plan:  Patient was evaluated and treated and all questions answered. -Discussed porokeratosis with patient and treatment options.  -Hyperkeratotic tissue was debrided with chisel without incident.  -Applied salycylic acid treatment to area with dressing. Advised to remove bandaging tomorrow.  -Encouraged daily moisturizing -Discussed use of pumice stone -Advised good supportive shoes and inserts -Patient to return to office as needed or sooner if condition worsens.   Lorenda Peck, DPM

## 2021-05-21 ENCOUNTER — Encounter: Payer: Self-pay | Admitting: Family Medicine

## 2021-05-23 ENCOUNTER — Encounter: Payer: Self-pay | Admitting: Podiatry

## 2021-05-25 DIAGNOSIS — Z170 Estrogen receptor positive status [ER+]: Secondary | ICD-10-CM | POA: Diagnosis not present

## 2021-05-25 DIAGNOSIS — C787 Secondary malignant neoplasm of liver and intrahepatic bile duct: Secondary | ICD-10-CM | POA: Diagnosis not present

## 2021-05-25 DIAGNOSIS — C50811 Malignant neoplasm of overlapping sites of right female breast: Secondary | ICD-10-CM | POA: Diagnosis not present

## 2021-05-25 DIAGNOSIS — C7951 Secondary malignant neoplasm of bone: Secondary | ICD-10-CM | POA: Diagnosis not present

## 2021-05-26 ENCOUNTER — Encounter: Payer: Self-pay | Admitting: Family Medicine

## 2021-05-26 DIAGNOSIS — T451X5A Adverse effect of antineoplastic and immunosuppressive drugs, initial encounter: Secondary | ICD-10-CM | POA: Diagnosis not present

## 2021-05-26 DIAGNOSIS — R748 Abnormal levels of other serum enzymes: Secondary | ICD-10-CM | POA: Diagnosis not present

## 2021-05-26 DIAGNOSIS — C7951 Secondary malignant neoplasm of bone: Secondary | ICD-10-CM | POA: Diagnosis not present

## 2021-05-26 DIAGNOSIS — C50811 Malignant neoplasm of overlapping sites of right female breast: Secondary | ICD-10-CM | POA: Diagnosis not present

## 2021-05-26 DIAGNOSIS — Z9189 Other specified personal risk factors, not elsewhere classified: Secondary | ICD-10-CM | POA: Diagnosis not present

## 2021-05-26 DIAGNOSIS — D701 Agranulocytosis secondary to cancer chemotherapy: Secondary | ICD-10-CM | POA: Diagnosis not present

## 2021-05-26 DIAGNOSIS — I10 Essential (primary) hypertension: Secondary | ICD-10-CM | POA: Diagnosis not present

## 2021-05-26 DIAGNOSIS — R7989 Other specified abnormal findings of blood chemistry: Secondary | ICD-10-CM | POA: Diagnosis not present

## 2021-05-26 DIAGNOSIS — C787 Secondary malignant neoplasm of liver and intrahepatic bile duct: Secondary | ICD-10-CM | POA: Diagnosis not present

## 2021-05-26 DIAGNOSIS — Z5112 Encounter for antineoplastic immunotherapy: Secondary | ICD-10-CM | POA: Diagnosis not present

## 2021-05-26 DIAGNOSIS — Z170 Estrogen receptor positive status [ER+]: Secondary | ICD-10-CM | POA: Diagnosis not present

## 2021-05-27 ENCOUNTER — Encounter: Payer: Self-pay | Admitting: Family Medicine

## 2021-05-28 ENCOUNTER — Encounter: Payer: Self-pay | Admitting: Family Medicine

## 2021-05-30 ENCOUNTER — Encounter: Payer: Self-pay | Admitting: Family Medicine

## 2021-05-30 NOTE — Progress Notes (Incomplete)
CARDIOLOGY CONSULT NOTE       Patient ID: [name] MRN: [id number] DOB/AGE: 66-Oct-1957 66 y.o.  Admit date: (Not on file) Referring Physician: Mathews,Cody Primary Physician: Luetta Nutting, DO Primary Cardiologist: New Reason for Consultation: Chest Pain palpitations  Active Problems:   * No active hospital problems. *   HPI:  65 y.o. previously seen in 2017 for chest pain and palpitations. She had a normal stress echo She has seen behavioral health for anxiety Palpitations benign had been on beta blocker She currently is being Rx for stage 4 breast cancer. Primary indicates she has not grasped the prognosis of this She has asthma and uses symbicort and ventolin She is on Celexa for her anxiety/depression She is seeing oncology at [facility] She has bone and liver mets She is receiving infusions of [name] and Delton See She had bilateral mastectomies in 2020 Echo done 08/16/18 showed normal EF 60-65%   ***  ROS All other systems reviewed and negative except as noted above  Past Medical History:  Diagnosis Date   Anxiety    Asthma    Breast cancer ([place])    Chest pain, atypical    Closed head injury 06/06/2017   Depression    Heart palpitations    hx of MVP   Hypertension    IBS (irritable bowel syndrome)    Pre-diabetes     Family History  Problem Relation Age of Onset   Heart attack Father    Lung cancer Mother    Bladder Cancer Brother     Social History   Socioeconomic History   Marital status: Divorced    Spouse name: Not on file   Number of children: Not on file   Years of education: Not on file   Highest education level: Not on file  Occupational History   Not on file  Tobacco Use   Smoking status: Never   Smokeless tobacco: Never   Tobacco comments:    nonsmoker  Vaping Use   Vaping Use: Never used  Substance and Sexual Activity   Alcohol use: No   Drug use: No   Sexual activity: Not Currently    Birth control/protection:  None  Other Topics Concern   Not on file  Social History Narrative   Divorced; grown son lives in [place].    [facility]- PCP   Social Determinants of Health   Financial Resource Strain: Not on file  Food Insecurity: Not on file  Transportation Needs: Not on file  Physical Activity: Not on file  Stress: Not on file  Social Connections: Not on file  Intimate Partner Violence: Not on file    Past Surgical History:  Procedure Laterality Date   BREAST SURGERY  06/2018   right and left breast   eAB     x2    MASTECTOMY MODIFIED RADICAL Bilateral 10/10/2018   Procedure: RIGHT MODIFIED RADICAL MASTECTOMY AND LEFT PROPHYLATIC MASTECTOMY;  Surgeon: [name], MD;  Location: [facility];  Service: General;  Laterality: Bilateral;   PORTACATH PLACEMENT Left 08/12/2018   Procedure: INSERTION PORT-A-CATH;  Surgeon: [name], MD;  Location: [place];  Service: General;  Laterality: Left;      Current Outpatient Medications:    acetaminophen (TYLENOL) 500 MG chewable tablet, Chew by mouth., Disp: , Rfl:    albuterol (VENTOLIN HFA) 108 (90 Base) MCG/ACT inhaler, Inhale 2 puffs into the lungs every 6 (six) hours as needed for wheezing or shortness of breath., Disp:  6.7 g, Rfl: 0   aspirin EC 81 MG tablet, Take 81 mg by mouth 4 (four) times a week., Disp: , Rfl:    B Complex-C (B-COMPLEX WITH VITAMIN C) tablet, Take 1 tablet by mouth daily after lunch., Disp: , Rfl:    budesonide-formoterol (SYMBICORT) 160-4.5 MCG/ACT inhaler, Inhale 2 puffs into the lungs 2 (two) times daily., Disp: 3 each, Rfl: 2   Calcium-Magnesium (CAL-MAG PO), Take 1 tablet by mouth 2 (two) times daily., Disp: , Rfl:    CHLOROPHYLL PO, Take 1 tablet by mouth every other day., Disp: , Rfl:    citalopram (CELEXA) 10 MG tablet, TAKE 1 TABLET BY MOUTH EVERY DAY, Disp: 90 tablet, Rfl: 2   clobetasol cream (TEMOVATE) 6.64 %, Apply 1 application topically 2 (two) times daily. (Patient not  taking: Reported on 04/18/2021), Disp: 60 g, Rfl: 1   doxycycline (VIBRA-TABS) 100 MG tablet, Take 1 tablet (100 mg total) by mouth 2 (two) times daily. (Patient not taking: Reported on 04/18/2021), Disp: 20 tablet, Rfl: 0   loratadine (CLARITIN) 10 MG tablet, Take 1 tablet (10 mg total) by mouth daily. (Patient taking differently: Take 10 mg by mouth daily as needed for allergies.), Disp: 90 tablet, Rfl: 3   ondansetron (ZOFRAN ODT) 8 MG disintegrating tablet, Take 1 tablet (8 mg total) by mouth every 8 (eight) hours as needed for nausea or vomiting., Disp: 20 tablet, Rfl: 0   triamcinolone cream (KENALOG) 0.5 %, Apply 1 application topically 2 (two) times daily. To affected areas., Disp: 454 g, Rfl: 3   Vitamin D-Vitamin K (D3 + K2 DOTS PO), Take 1 tablet by mouth daily., Disp: , Rfl:     Physical Exam: There were no vitals taken for this visit.    Affect appropriate Chronically ill female  HEENT: some alopecia  Neck supple with no adenopathy JVP normal no bruits no thyromegaly Lungs clear with no wheezing and good diaphragmatic motion Heart:  S1/S2 no murmur, no rub, gallop or click PMI normal Abdomen: benighn, BS positve, no tenderness, no AAA no bruit.  No HSM or HJR Distal pulses intact with no bruits No edema Neuro non-focal Skin warm and dry No muscular weakness   Labs:   Lab Results  Component Value Date   WBC 4.1 01/01/2020   HGB 13.4 01/01/2020   HCT 39.5 01/01/2020   MCV 87.2 01/01/2020   PLT 265 01/01/2020   No results for input(s): NA, K, CL, CO2, BUN, CREATININE, CALCIUM, PROT, BILITOT, ALKPHOS, ALT, AST, GLUCOSE in the last 168 hours.  Invalid input(s): LABALBU Lab Results  Component Value Date   QIHKVQQ 595 (H) 04/11/2018    Lab Results  Component Value Date   CHOL 176 08/31/2020   CHOL 209 (H) 07/30/2019   CHOL 199 10/10/2017   Lab Results  Component Value Date   HDL 53 08/31/2020   HDL 57 07/30/2019   HDL 53 10/10/2017   Lab Results   Component Value Date   LDLCALC 102 (H) 08/31/2020   LDLCALC 112 (H) 07/30/2019   LDLCALC 120 (H) 10/10/2017   Lab Results  Component Value Date   TRIG 117 08/31/2020   TRIG 277 (H) 07/30/2019   TRIG 146 10/10/2017   Lab Results  Component Value Date   CHOLHDL 3.3 08/31/2020   CHOLHDL 3.7 07/30/2019   CHOLHDL 3.8 10/10/2017   No results found for: LDLDIRECT    Radiology: No results found.  EKG: ***   ASSESSMENT AND PLAN:   Chest  pain: distant normal stress echo in past *** Stage 4 breast cancer.  F/U echo has had XRT and anthracycline in past EF normal 2020 F/U oncology [facility] with chemoRx Anxiety/Depression:  on Celexa Asthma: no active wheezing continue symbicort and ventolin    TTE: cancer post chemo  ***  Signed: Jenkins Rouge 05/30/2021, 5:22 PM

## 2021-06-01 DIAGNOSIS — C787 Secondary malignant neoplasm of liver and intrahepatic bile duct: Secondary | ICD-10-CM | POA: Diagnosis not present

## 2021-06-01 DIAGNOSIS — C50811 Malignant neoplasm of overlapping sites of right female breast: Secondary | ICD-10-CM | POA: Diagnosis not present

## 2021-06-01 DIAGNOSIS — C7951 Secondary malignant neoplasm of bone: Secondary | ICD-10-CM | POA: Diagnosis not present

## 2021-06-01 DIAGNOSIS — Z170 Estrogen receptor positive status [ER+]: Secondary | ICD-10-CM | POA: Diagnosis not present

## 2021-06-02 DIAGNOSIS — C787 Secondary malignant neoplasm of liver and intrahepatic bile duct: Secondary | ICD-10-CM | POA: Diagnosis not present

## 2021-06-02 DIAGNOSIS — C7951 Secondary malignant neoplasm of bone: Secondary | ICD-10-CM | POA: Diagnosis not present

## 2021-06-02 DIAGNOSIS — Z170 Estrogen receptor positive status [ER+]: Secondary | ICD-10-CM | POA: Diagnosis not present

## 2021-06-02 DIAGNOSIS — Z5112 Encounter for antineoplastic immunotherapy: Secondary | ICD-10-CM | POA: Diagnosis not present

## 2021-06-02 DIAGNOSIS — C50811 Malignant neoplasm of overlapping sites of right female breast: Secondary | ICD-10-CM | POA: Diagnosis not present

## 2021-06-03 ENCOUNTER — Encounter: Payer: Self-pay | Admitting: Family Medicine

## 2021-06-03 DIAGNOSIS — Z170 Estrogen receptor positive status [ER+]: Secondary | ICD-10-CM | POA: Diagnosis not present

## 2021-06-03 DIAGNOSIS — C7951 Secondary malignant neoplasm of bone: Secondary | ICD-10-CM | POA: Diagnosis not present

## 2021-06-03 DIAGNOSIS — C50811 Malignant neoplasm of overlapping sites of right female breast: Secondary | ICD-10-CM | POA: Diagnosis not present

## 2021-06-03 DIAGNOSIS — C787 Secondary malignant neoplasm of liver and intrahepatic bile duct: Secondary | ICD-10-CM | POA: Diagnosis not present

## 2021-06-07 ENCOUNTER — Ambulatory Visit: Payer: Medicare Other | Admitting: Cardiovascular Disease

## 2021-06-07 ENCOUNTER — Ambulatory Visit (INDEPENDENT_AMBULATORY_CARE_PROVIDER_SITE_OTHER): Payer: Medicare Other | Admitting: Physician Assistant

## 2021-06-07 ENCOUNTER — Encounter: Payer: Self-pay | Admitting: Physician Assistant

## 2021-06-07 ENCOUNTER — Other Ambulatory Visit: Payer: Self-pay

## 2021-06-07 VITALS — BP 147/76 | HR 88 | Ht 64.5 in | Wt 179.0 lb

## 2021-06-07 DIAGNOSIS — F331 Major depressive disorder, recurrent, moderate: Secondary | ICD-10-CM | POA: Diagnosis not present

## 2021-06-07 DIAGNOSIS — G479 Sleep disorder, unspecified: Secondary | ICD-10-CM | POA: Diagnosis not present

## 2021-06-07 DIAGNOSIS — F411 Generalized anxiety disorder: Secondary | ICD-10-CM

## 2021-06-07 DIAGNOSIS — F430 Acute stress reaction: Secondary | ICD-10-CM

## 2021-06-07 NOTE — Patient Instructions (Signed)

## 2021-06-07 NOTE — Progress Notes (Signed)
Subjective:    Patient ID: [name], female    DOB: 19-Nov-1955, 65 y.o.   MRN: [id number]  HPI Pt is a 65 yo obese female with stage 4 breast cancer, MDD, GAD who presents to the clinic to discuss increase in anxiety, worry stress. She lives in a townhouse that she rents and is having trouble with the neighbors. The neighbors are very loud and play very loud music all night and is creating stress and she is not able to sleep. This has been going on for 1 year but worse over the past 2 months. This weekend was the worst. She has gotten about 10 hours of sleep in 4 days. She left a note on the mailbox to turn the music down after 11 and her landlord threatened to evict her if she did anything like that again. She is not sleeping which is creating more anxiety. She is using natural remedies but do not seem to be helping. She is worried about this stress causing her cancer or cancer treatment to be harder or stop working.    .. Active Ambulatory Problems    Diagnosis Date Noted   GAD (generalized anxiety disorder) 05/01/2012   Essential hypertension 08/23/2012   Hyperlipidemia 12/31/2013   Asthma, persistent controlled 02/26/2014   Eczematous dermatitis 02/26/2014   Prediabetes 02/26/2014   Insomnia 09/28/2015   Palpitation 08/29/2016   BMI 30.0-30.9,adult 05/03/2017   Gastritis 06/27/2017   Seborrheic dermatitis 07/19/2017   Allergic conjunctivitis 07/19/2017   Abnormal fasting glucose 10/11/2017   Positive ANA (antinuclear antibody) 04/15/2018   Elevated CK 04/15/2018   Vitamin D deficiency 04/15/2018   Acute stress reaction 04/15/2018   Dysuria 04/30/2018   Malignant neoplasm of upper-outer quadrant of right breast in female, estrogen receptor positive ([place]) 07/25/2018   Chest discomfort 07/30/2019   Increased risk of breast cancer 12/13/2018   Moderate episode of recurrent major depressive disorder ([place]) 08/21/2018   Dizziness 01/01/2020   Redness and swelling of upper arm  03/18/2020   T2DM (type 2 diabetes mellitus) ([place]) 04/19/2020   Head injury 04/22/2020   Positive colorectal cancer screening using Cologuard test 09/05/2020   Elevated LFTs 09/06/2020   Fatigue 09/06/2020   Liver metastases ([place]) 10/11/2020   Bone metastases ([place]) 10/10/2020   Stage IV breast cancer in female [facility]) 10/17/2020   Cellulitis 11/07/2020   Chemotherapy induced neutropenia ([place]) 04/14/2021   Resolved Ambulatory Problems    Diagnosis Date Noted   Chest pain 05/01/2012   Itchy scalp 05/01/2012   Symptomatic menopausal or female climacteric states 01/30/2013   Hyperglycemia 12/31/2013   Menopausal symptom 05/12/2014   Elevated blood pressure 06/18/2015   Cellulitis of right breast 05/24/2016   Impetigo 84/53/6468   Lichen planus 10/19/2246   Closed head injury 06/06/2017   Bilateral posterior neck pain 06/07/2017   Malignant neoplasm of overlapping sites of right breast in female, estrogen receptor positive ([place]) 08/21/2018   Cancer of right female breast ([place]) 10/10/2018   Skin lesion of scalp 09/15/2019   High risk of congestive heart failure 12/13/2018   Open chest wound, right, sequela 03/13/2019   Radiation burn 03/13/2019   Rabies exposure 03/10/2020   Cat bite 03/18/2020   Past Medical History:  Diagnosis Date   Anxiety    Asthma    Breast cancer ([place])    Chest pain, atypical    Depression    Heart palpitations    Hypertension    IBS (irritable bowel syndrome)  Pre-diabetes       Review of Systems See HPI.     Objective:   Physical Exam Vitals reviewed.  Constitutional:      Appearance: Normal appearance. She is obese.  HENT:     Head: Normocephalic.  Cardiovascular:     Rate and Rhythm: Normal rate.     Pulses: Normal pulses.  Neurological:     General: No focal deficit present.     Mental Status: She is alert.  Psychiatric:     Comments: anxious      .Marland Kitchen Depression screen [facility] 2/9 06/07/2021 04/18/2021 09/15/2019 02/07/2019  09/18/2018  Decreased Interest 1 0 0 1 2  Down, Depressed, Hopeless 1 0 0 1 2  PHQ - 2 Score 2 0 0 2 4  Altered sleeping 2 - 1 1 1   Tired, decreased energy 1 - 1 1 2   Change in appetite 2 - 1 1 0  Feeling bad or failure about yourself  0 - 0 0 0  Trouble concentrating 0 - 0 0 0  Moving slowly or fidgety/restless 1 - 0 0 0  Suicidal thoughts 0 - 0 0 0  PHQ-9 Score 8 - 3 5 7   Difficult doing work/chores Somewhat difficult - Not difficult at all Somewhat difficult Somewhat difficult  Some recent data might be hidden   .Marland Kitchen GAD 7 : Generalized Anxiety Score 06/07/2021 09/15/2019 02/07/2019 09/18/2018  Nervous, Anxious, on Edge 1 1 2 1   Control/stop worrying 1 1 1 2   Worry too much - different things 1 1 2 2   Trouble relaxing 1 0 1 1  Restless 1 1 1  0  Easily annoyed or irritable 1 0 1 1  Afraid - awful might happen 0 0 1 2  Total GAD 7 Score 6 4 9 9   Anxiety Difficulty Somewhat difficult Not difficult at all Somewhat difficult Somewhat difficult        Assessment & Plan:  Marland KitchenMarland KitchenNisa was seen today for anxiety and stress.  Diagnoses and all orders for this visit:  Acute stress reaction  Trouble in sleeping  GAD (generalized anxiety disorder)  Moderate episode of recurrent major depressive disorder ([place])  Pt declined increase in celexa, medication to help sleep, medication for acute anxiety at this time. She will stick with her natural options  Consider moving bedroom to front of house to help with loud music.  Consider a meeting with landlord just to express concerns and see if there is a solution.   Spent 30 minutes with patient reviewing chart, discussing ways to help her stressful encounters and offering treatment plan options.

## 2021-06-08 ENCOUNTER — Encounter: Payer: Self-pay | Admitting: Physician Assistant

## 2021-06-09 ENCOUNTER — Encounter: Payer: Self-pay | Admitting: Physician Assistant

## 2021-06-10 ENCOUNTER — Encounter: Payer: Self-pay | Admitting: Physician Assistant

## 2021-06-13 ENCOUNTER — Encounter: Payer: Self-pay | Admitting: Family Medicine

## 2021-06-13 ENCOUNTER — Ambulatory Visit: Payer: Medicare Other | Admitting: Cardiovascular Disease

## 2021-06-13 NOTE — Telephone Encounter (Signed)
Pt is calling and wanting to know if she can schedule to have a EKG done with Dr.Matthews instead of Driving to [facility] to see her heart Dr? Please advise pt

## 2021-06-13 NOTE — Telephone Encounter (Signed)
Called patient, left message for patient to call back.

## 2021-06-13 NOTE — Telephone Encounter (Signed)
Why does she feel that she needs an urgent EKG?  I would strongly encourage follow up with cardiology

## 2021-06-13 NOTE — Telephone Encounter (Signed)
Called patient and made an appointment with the DOD today. Then patient called back and canceled appointment.

## 2021-06-15 ENCOUNTER — Encounter: Payer: Self-pay | Admitting: Family Medicine

## 2021-06-15 ENCOUNTER — Encounter: Payer: Self-pay | Admitting: Physician Assistant

## 2021-06-15 DIAGNOSIS — C50811 Malignant neoplasm of overlapping sites of right female breast: Secondary | ICD-10-CM | POA: Diagnosis not present

## 2021-06-15 DIAGNOSIS — Z170 Estrogen receptor positive status [ER+]: Secondary | ICD-10-CM | POA: Diagnosis not present

## 2021-06-15 DIAGNOSIS — C7951 Secondary malignant neoplasm of bone: Secondary | ICD-10-CM | POA: Diagnosis not present

## 2021-06-15 DIAGNOSIS — C787 Secondary malignant neoplasm of liver and intrahepatic bile duct: Secondary | ICD-10-CM | POA: Diagnosis not present

## 2021-06-15 NOTE — Telephone Encounter (Signed)
She needs appt if symptoms continue.

## 2021-06-16 ENCOUNTER — Encounter: Payer: Self-pay | Admitting: Cardiovascular Disease

## 2021-06-16 DIAGNOSIS — C7951 Secondary malignant neoplasm of bone: Secondary | ICD-10-CM | POA: Diagnosis not present

## 2021-06-16 DIAGNOSIS — I10 Essential (primary) hypertension: Secondary | ICD-10-CM | POA: Diagnosis not present

## 2021-06-16 DIAGNOSIS — Z170 Estrogen receptor positive status [ER+]: Secondary | ICD-10-CM | POA: Diagnosis not present

## 2021-06-16 DIAGNOSIS — C787 Secondary malignant neoplasm of liver and intrahepatic bile duct: Secondary | ICD-10-CM | POA: Diagnosis not present

## 2021-06-16 DIAGNOSIS — Z5112 Encounter for antineoplastic immunotherapy: Secondary | ICD-10-CM | POA: Diagnosis not present

## 2021-06-16 DIAGNOSIS — Z5111 Encounter for antineoplastic chemotherapy: Secondary | ICD-10-CM | POA: Diagnosis not present

## 2021-06-16 DIAGNOSIS — C50811 Malignant neoplasm of overlapping sites of right female breast: Secondary | ICD-10-CM | POA: Diagnosis not present

## 2021-06-22 DIAGNOSIS — C7951 Secondary malignant neoplasm of bone: Secondary | ICD-10-CM | POA: Diagnosis not present

## 2021-06-22 DIAGNOSIS — Z170 Estrogen receptor positive status [ER+]: Secondary | ICD-10-CM | POA: Diagnosis not present

## 2021-06-22 DIAGNOSIS — C50811 Malignant neoplasm of overlapping sites of right female breast: Secondary | ICD-10-CM | POA: Diagnosis not present

## 2021-06-22 DIAGNOSIS — C787 Secondary malignant neoplasm of liver and intrahepatic bile duct: Secondary | ICD-10-CM | POA: Diagnosis not present

## 2021-06-24 DIAGNOSIS — Z5112 Encounter for antineoplastic immunotherapy: Secondary | ICD-10-CM | POA: Diagnosis not present

## 2021-06-24 DIAGNOSIS — C787 Secondary malignant neoplasm of liver and intrahepatic bile duct: Secondary | ICD-10-CM | POA: Diagnosis not present

## 2021-06-24 DIAGNOSIS — C50811 Malignant neoplasm of overlapping sites of right female breast: Secondary | ICD-10-CM | POA: Diagnosis not present

## 2021-06-24 DIAGNOSIS — C7951 Secondary malignant neoplasm of bone: Secondary | ICD-10-CM | POA: Diagnosis not present

## 2021-06-24 DIAGNOSIS — Z170 Estrogen receptor positive status [ER+]: Secondary | ICD-10-CM | POA: Diagnosis not present

## 2021-06-27 DIAGNOSIS — C50811 Malignant neoplasm of overlapping sites of right female breast: Secondary | ICD-10-CM | POA: Diagnosis not present

## 2021-06-27 DIAGNOSIS — C7951 Secondary malignant neoplasm of bone: Secondary | ICD-10-CM | POA: Diagnosis not present

## 2021-06-27 DIAGNOSIS — Z170 Estrogen receptor positive status [ER+]: Secondary | ICD-10-CM | POA: Diagnosis not present

## 2021-06-27 DIAGNOSIS — C787 Secondary malignant neoplasm of liver and intrahepatic bile duct: Secondary | ICD-10-CM | POA: Diagnosis not present

## 2021-06-28 ENCOUNTER — Encounter: Payer: Self-pay | Admitting: Cardiovascular Disease

## 2021-06-29 NOTE — Progress Notes (Signed)
CARDIOLOGY CONSULT NOTE       Patient ID: [name] MRN: [id number] DOB/AGE: [birth year] 65 y.o.  Referring Physician: Zigmund [name] Primary Physician: Kristin Nutting, DO Primary Cardiologist: [name]: chest pain  Active Problems:   * No active hospital problems. *   HPI:  65 y.o. referred by Dr [name] Patient called his office wanting an ECG Last seen by cardiology in 2017 for atypical chest pain and palpitations Had normal stress echo in 2012 Sees behavioral health for anxiety Palpitations related to this and at one time on beta blocker. Stress echo 09/11/16 also normal and TTE 08/16/18 normal with no structural heart disease and despite historical diagnosis no MVP  ECG 08/22/19 NSR rate 68 normal ECG   Since last seen diagnosed with stage 4 breast cancer Her neighbors are loud and stress her She is Kristin sleeping and anxiety worse Did Kristin want to increase Celexa and using "natural" remedies   Sees oncology at [facility] Getting Ziextenzo injections [name] She has liver and bone mets Had bilateral mastectomies Dr [name] 2020 and XRT as well   She has benign sounding palpitations related to stress and caffeine   ROS All other systems reviewed and negative except as noted above  Past Medical History:  Diagnosis Date   Anxiety    Asthma    Breast cancer ([place])    Chest pain, atypical    Closed head injury 06/06/2017   Depression    Heart palpitations    hx of MVP   Hypertension    IBS (irritable bowel syndrome)    Pre-diabetes     Family History  Problem Relation Age of Onset   Heart attack Father    Lung cancer Mother    Bladder Cancer Brother     Social History   Socioeconomic History   Marital status: Divorced    Spouse name: [name]   Number of children: [name]   Years of education: [name]   Highest education level: [name]  Occupational History   [name]  Tobacco Use   Smoking status: Never   Smokeless tobacco:  Never   Tobacco comments:    nonsmoker  Vaping Use   Vaping Use: Never used  Substance and Sexual Activity   Alcohol use: No   Drug use: No   Sexual activity: Kristin Currently    Birth control/protection: None  Other Topics Concern   [name]  Social History Narrative   Divorced; grown son lives in [place].    [facility]- PCP   Social Determinants of Health   Financial Resource Strain: [name]  Food Insecurity: [name]  Transportation Needs: [name]  Physical Activity: [name]  Stress: [name]  Social Connections: [name]  Intimate Partner Violence: [name]    Past Surgical History:  Procedure Laterality Date   BREAST SURGERY  06/2018   right and left breast   eAB     x2    MASTECTOMY MODIFIED RADICAL Bilateral 10/10/2018   Procedure: RIGHT MODIFIED RADICAL MASTECTOMY AND LEFT PROPHYLATIC MASTECTOMY;  Surgeon: Kristin Kussmaul, MD;  [facility];  Service: General;  Laterality: Bilateral;   PORTACATH PLACEMENT Left 08/12/2018   Procedure: INSERTION PORT-A-CATH;  Surgeon: Kristin Kussmaul, MD;  Location: [place];  Service: General;  Laterality: Left;      Current Outpatient Medications:  acetaminophen (TYLENOL) 500 MG chewable tablet, Chew by mouth., Disp: , Rfl:    albuterol (VENTOLIN HFA) 108 (90 Base) MCG/ACT inhaler, Inhale 2 puffs into the lungs every 6 (six) hours as needed for wheezing or shortness of breath., Disp: 6.7 g, Rfl: 0   aspirin EC 81 MG tablet, Take 81 mg by mouth 4 (four) times a week., Disp: , Rfl:    B Complex-C (B-COMPLEX WITH VITAMIN C) tablet, Take 1 tablet by mouth daily after lunch., Disp: , Rfl:    Calcium-Magnesium (CAL-MAG PO), Take 1 tablet by mouth 2 (two) times daily., Disp: , Rfl:    CHLOROPHYLL PO, Take 1 tablet by mouth every other day., Disp: , Rfl:    citalopram (CELEXA) 10 MG tablet, TAKE 1 TABLET BY MOUTH EVERY DAY, Disp: 90 tablet, Rfl: 2   loratadine (CLARITIN) 10 MG tablet,  Take 1 tablet (10 mg total) by mouth daily. (Patient taking differently: Take 10 mg by mouth daily as needed for allergies.), Disp: 90 tablet, Rfl: 3   ondansetron (ZOFRAN ODT) 8 MG disintegrating tablet, Take 1 tablet (8 mg total) by mouth every 8 (eight) hours as needed for nausea or vomiting., Disp: 20 tablet, Rfl: 0   triamcinolone cream (KENALOG) 0.5 %, Apply 1 application topically 2 (two) times daily. To affected areas., Disp: 454 g, Rfl: 3   Vitamin D-Vitamin K (D3 + K2 DOTS PO), Take 1 tablet by mouth daily., Disp: , Rfl:    budesonide-formoterol (SYMBICORT) 160-4.5 MCG/ACT inhaler, Inhale 2 puffs into the lungs 2 (two) times daily., Disp: 3 each, Rfl: 2    Physical Exam: Blood pressure (!) 144/68, pulse 84, height 5' 4.5" (1.638 m), weight 177 lb (80.3 kg), SpO2 98 %.  Affect appropriate Healthy:  appears stated age 24: normal Neck supple with no adenopathy JVP normal no bruits no thyromegaly Lungs clear with no wheezing and good diaphragmatic motion Heart:  S1/S2 no murmur, no rub, gallop or click PMI normal Abdomen: benighn, BS positve, no tenderness, no AAA no bruit.  No HSM or HJR Distal pulses intact with no bruits No edema Neuro non-focal Skin warm and dry No muscular weakness   Labs:   Lab Results  Component Value Date   WBC 4.1 01/01/2020   HGB 13.4 01/01/2020   HCT 39.5 01/01/2020   MCV 87.2 01/01/2020   PLT 265 01/01/2020   No results for input(s): NA, K, CL, CO2, BUN, CREATININE, CALCIUM, PROT, BILITOT, ALKPHOS, ALT, AST, GLUCOSE in the last 168 hours.  Invalid input(s): LABALBU Lab Results  Component Value Date   NTIRWER 154 (H) 04/11/2018    Lab Results  Component Value Date   CHOL 176 08/31/2020   CHOL 209 (H) 07/30/2019   CHOL 199 10/10/2017   Lab Results  Component Value Date   HDL 53 08/31/2020   HDL 57 07/30/2019   HDL 53 10/10/2017   Lab Results  Component Value Date   LDLCALC 102 (H) 08/31/2020   LDLCALC 112 (H) 07/30/2019    LDLCALC 120 (H) 10/10/2017   Lab Results  Component Value Date   TRIG 117 08/31/2020   TRIG 277 (H) 07/30/2019   TRIG 146 10/10/2017   Lab Results  Component Value Date   CHOLHDL 3.3 08/31/2020   CHOLHDL 3.7 07/30/2019   CHOLHDL 3.8 10/10/2017   No results found for: LDLDIRECT    Radiology: No results found.  EKG: SR rate 84 normal tall R in V1 lead placement    ASSESSMENT AND  PLAN:   Chest Pain:  non cardiac observe Palpitations:  benign related to stress and caffeine ECG normal  Anxiety:  prime issue along with cancer see above  Stage 4 Breast Cancer:  follows in [place] due for staging CT And bone scan Prognosis seems poor from this   F/U PRN   Signed: Jenkins Rouge 07/05/2021, 8:59 AM

## 2021-07-05 ENCOUNTER — Other Ambulatory Visit: Payer: Self-pay

## 2021-07-05 ENCOUNTER — Encounter: Payer: Self-pay | Admitting: Cardiovascular Disease

## 2021-07-05 ENCOUNTER — Ambulatory Visit: Payer: Medicare Other | Admitting: Cardiovascular Disease

## 2021-07-05 VITALS — BP 144/68 | HR 84 | Ht 64.5 in | Wt 177.0 lb

## 2021-07-05 DIAGNOSIS — R0789 Other chest pain: Secondary | ICD-10-CM

## 2021-07-05 DIAGNOSIS — I10 Essential (primary) hypertension: Secondary | ICD-10-CM

## 2021-07-05 DIAGNOSIS — R002 Palpitations: Secondary | ICD-10-CM

## 2021-07-05 NOTE — Patient Instructions (Signed)
Medication Instructions:  Your physician recommends that you continue on your current medications as directed. Please refer to the Current Medication list given to you today.  *If you need a refill on your cardiac medications before your next appointment, please call your pharmacy*   Lab Work: NONE If you have labs (blood work) drawn today and your tests are completely normal, you will receive your results only by: [place] (if you have MyChart) OR A paper copy in the mail If you have any lab test that is abnormal or we need to change your treatment, we will call you to review the results.   Testing/Procedures: NONE   Follow-Up: As needed  At [facility], you and your health needs are our priority.  As part of our continuing mission to provide you with exceptional heart care, we have created designated Provider Care Teams.  These Care Teams include your primary Cardiologist (physician) and Advanced Practice Providers (APPs -  Physician Assistants and Nurse Practitioners) who all work together to provide you with the care you need, when you need it.   Provider:   Jenkins Rouge, MD}

## 2021-07-22 ENCOUNTER — Ambulatory Visit: Payer: Medicare Other | Admitting: Podiatry

## 2021-08-03 DIAGNOSIS — Z170 Estrogen receptor positive status [ER+]: Secondary | ICD-10-CM | POA: Diagnosis not present

## 2021-08-03 DIAGNOSIS — C50811 Malignant neoplasm of overlapping sites of right female breast: Secondary | ICD-10-CM | POA: Diagnosis not present

## 2021-08-03 DIAGNOSIS — C787 Secondary malignant neoplasm of liver and intrahepatic bile duct: Secondary | ICD-10-CM | POA: Diagnosis not present

## 2021-08-03 DIAGNOSIS — C7951 Secondary malignant neoplasm of bone: Secondary | ICD-10-CM | POA: Diagnosis not present

## 2021-08-04 DIAGNOSIS — C7951 Secondary malignant neoplasm of bone: Secondary | ICD-10-CM | POA: Diagnosis not present

## 2021-08-04 DIAGNOSIS — C50811 Malignant neoplasm of overlapping sites of right female breast: Secondary | ICD-10-CM | POA: Diagnosis not present

## 2021-08-04 DIAGNOSIS — Z170 Estrogen receptor positive status [ER+]: Secondary | ICD-10-CM | POA: Diagnosis not present

## 2021-08-04 DIAGNOSIS — Z5112 Encounter for antineoplastic immunotherapy: Secondary | ICD-10-CM | POA: Diagnosis not present

## 2021-08-04 DIAGNOSIS — C787 Secondary malignant neoplasm of liver and intrahepatic bile duct: Secondary | ICD-10-CM | POA: Diagnosis not present

## 2021-08-05 DIAGNOSIS — T451X5A Adverse effect of antineoplastic and immunosuppressive drugs, initial encounter: Secondary | ICD-10-CM | POA: Diagnosis not present

## 2021-08-05 DIAGNOSIS — C50811 Malignant neoplasm of overlapping sites of right female breast: Secondary | ICD-10-CM | POA: Diagnosis not present

## 2021-08-05 DIAGNOSIS — Z170 Estrogen receptor positive status [ER+]: Secondary | ICD-10-CM | POA: Diagnosis not present

## 2021-08-05 DIAGNOSIS — D701 Agranulocytosis secondary to cancer chemotherapy: Secondary | ICD-10-CM | POA: Diagnosis not present

## 2021-08-05 DIAGNOSIS — C7951 Secondary malignant neoplasm of bone: Secondary | ICD-10-CM | POA: Diagnosis not present

## 2021-08-05 DIAGNOSIS — C787 Secondary malignant neoplasm of liver and intrahepatic bile duct: Secondary | ICD-10-CM | POA: Diagnosis not present

## 2021-08-09 ENCOUNTER — Encounter: Payer: Self-pay | Admitting: Cardiovascular Disease

## 2021-08-17 DIAGNOSIS — I10 Essential (primary) hypertension: Secondary | ICD-10-CM | POA: Diagnosis not present

## 2021-08-17 DIAGNOSIS — C787 Secondary malignant neoplasm of liver and intrahepatic bile duct: Secondary | ICD-10-CM | POA: Diagnosis not present

## 2021-08-17 DIAGNOSIS — C7951 Secondary malignant neoplasm of bone: Secondary | ICD-10-CM | POA: Diagnosis not present

## 2021-08-17 DIAGNOSIS — Z170 Estrogen receptor positive status [ER+]: Secondary | ICD-10-CM | POA: Diagnosis not present

## 2021-08-17 DIAGNOSIS — C50811 Malignant neoplasm of overlapping sites of right female breast: Secondary | ICD-10-CM | POA: Diagnosis not present

## 2021-08-18 DIAGNOSIS — Z170 Estrogen receptor positive status [ER+]: Secondary | ICD-10-CM | POA: Diagnosis not present

## 2021-08-18 DIAGNOSIS — C50811 Malignant neoplasm of overlapping sites of right female breast: Secondary | ICD-10-CM | POA: Diagnosis not present

## 2021-08-18 DIAGNOSIS — Z5112 Encounter for antineoplastic immunotherapy: Secondary | ICD-10-CM | POA: Diagnosis not present

## 2021-08-18 DIAGNOSIS — C7951 Secondary malignant neoplasm of bone: Secondary | ICD-10-CM | POA: Diagnosis not present

## 2021-08-18 DIAGNOSIS — C787 Secondary malignant neoplasm of liver and intrahepatic bile duct: Secondary | ICD-10-CM | POA: Diagnosis not present

## 2021-08-20 ENCOUNTER — Encounter: Payer: Self-pay | Admitting: Family Medicine

## 2021-08-24 DIAGNOSIS — C7951 Secondary malignant neoplasm of bone: Secondary | ICD-10-CM | POA: Diagnosis not present

## 2021-08-24 DIAGNOSIS — C50811 Malignant neoplasm of overlapping sites of right female breast: Secondary | ICD-10-CM | POA: Diagnosis not present

## 2021-08-24 DIAGNOSIS — Z170 Estrogen receptor positive status [ER+]: Secondary | ICD-10-CM | POA: Diagnosis not present

## 2021-08-24 DIAGNOSIS — C787 Secondary malignant neoplasm of liver and intrahepatic bile duct: Secondary | ICD-10-CM | POA: Diagnosis not present

## 2021-08-25 DIAGNOSIS — Z170 Estrogen receptor positive status [ER+]: Secondary | ICD-10-CM | POA: Diagnosis not present

## 2021-08-25 DIAGNOSIS — C787 Secondary malignant neoplasm of liver and intrahepatic bile duct: Secondary | ICD-10-CM | POA: Diagnosis not present

## 2021-08-25 DIAGNOSIS — C50811 Malignant neoplasm of overlapping sites of right female breast: Secondary | ICD-10-CM | POA: Diagnosis not present

## 2021-08-25 DIAGNOSIS — Z5112 Encounter for antineoplastic immunotherapy: Secondary | ICD-10-CM | POA: Diagnosis not present

## 2021-08-25 DIAGNOSIS — C7951 Secondary malignant neoplasm of bone: Secondary | ICD-10-CM | POA: Diagnosis not present

## 2021-08-26 DIAGNOSIS — Z170 Estrogen receptor positive status [ER+]: Secondary | ICD-10-CM | POA: Diagnosis not present

## 2021-08-26 DIAGNOSIS — C7951 Secondary malignant neoplasm of bone: Secondary | ICD-10-CM | POA: Diagnosis not present

## 2021-08-26 DIAGNOSIS — C787 Secondary malignant neoplasm of liver and intrahepatic bile duct: Secondary | ICD-10-CM | POA: Diagnosis not present

## 2021-08-26 DIAGNOSIS — C50811 Malignant neoplasm of overlapping sites of right female breast: Secondary | ICD-10-CM | POA: Diagnosis not present

## 2021-09-07 DIAGNOSIS — C787 Secondary malignant neoplasm of liver and intrahepatic bile duct: Secondary | ICD-10-CM | POA: Diagnosis not present

## 2021-09-07 DIAGNOSIS — C7951 Secondary malignant neoplasm of bone: Secondary | ICD-10-CM | POA: Diagnosis not present

## 2021-09-07 DIAGNOSIS — C50811 Malignant neoplasm of overlapping sites of right female breast: Secondary | ICD-10-CM | POA: Diagnosis not present

## 2021-09-07 DIAGNOSIS — Z170 Estrogen receptor positive status [ER+]: Secondary | ICD-10-CM | POA: Diagnosis not present

## 2021-09-08 DIAGNOSIS — Z170 Estrogen receptor positive status [ER+]: Secondary | ICD-10-CM | POA: Diagnosis not present

## 2021-09-08 DIAGNOSIS — Z5112 Encounter for antineoplastic immunotherapy: Secondary | ICD-10-CM | POA: Diagnosis not present

## 2021-09-08 DIAGNOSIS — C7951 Secondary malignant neoplasm of bone: Secondary | ICD-10-CM | POA: Diagnosis not present

## 2021-09-08 DIAGNOSIS — C787 Secondary malignant neoplasm of liver and intrahepatic bile duct: Secondary | ICD-10-CM | POA: Diagnosis not present

## 2021-09-08 DIAGNOSIS — C50811 Malignant neoplasm of overlapping sites of right female breast: Secondary | ICD-10-CM | POA: Diagnosis not present

## 2021-09-14 DIAGNOSIS — C50811 Malignant neoplasm of overlapping sites of right female breast: Secondary | ICD-10-CM | POA: Diagnosis not present

## 2021-09-14 DIAGNOSIS — C787 Secondary malignant neoplasm of liver and intrahepatic bile duct: Secondary | ICD-10-CM | POA: Diagnosis not present

## 2021-09-14 DIAGNOSIS — C7951 Secondary malignant neoplasm of bone: Secondary | ICD-10-CM | POA: Diagnosis not present

## 2021-09-14 DIAGNOSIS — Z170 Estrogen receptor positive status [ER+]: Secondary | ICD-10-CM | POA: Diagnosis not present

## 2021-09-15 DIAGNOSIS — C787 Secondary malignant neoplasm of liver and intrahepatic bile duct: Secondary | ICD-10-CM | POA: Diagnosis not present

## 2021-09-15 DIAGNOSIS — C7951 Secondary malignant neoplasm of bone: Secondary | ICD-10-CM | POA: Diagnosis not present

## 2021-09-15 DIAGNOSIS — Z170 Estrogen receptor positive status [ER+]: Secondary | ICD-10-CM | POA: Diagnosis not present

## 2021-09-15 DIAGNOSIS — C50811 Malignant neoplasm of overlapping sites of right female breast: Secondary | ICD-10-CM | POA: Diagnosis not present

## 2021-09-15 DIAGNOSIS — Z5111 Encounter for antineoplastic chemotherapy: Secondary | ICD-10-CM | POA: Diagnosis not present

## 2021-09-16 DIAGNOSIS — C7951 Secondary malignant neoplasm of bone: Secondary | ICD-10-CM | POA: Diagnosis not present

## 2021-09-16 DIAGNOSIS — C50811 Malignant neoplasm of overlapping sites of right female breast: Secondary | ICD-10-CM | POA: Diagnosis not present

## 2021-09-16 DIAGNOSIS — C787 Secondary malignant neoplasm of liver and intrahepatic bile duct: Secondary | ICD-10-CM | POA: Diagnosis not present

## 2021-09-16 DIAGNOSIS — Z170 Estrogen receptor positive status [ER+]: Secondary | ICD-10-CM | POA: Diagnosis not present

## 2021-09-20 ENCOUNTER — Ambulatory Visit: Payer: Medicare Other | Admitting: Podiatry

## 2021-09-22 ENCOUNTER — Encounter: Payer: Self-pay | Admitting: Family Medicine

## 2021-09-22 DIAGNOSIS — M5430 Sciatica, unspecified side: Secondary | ICD-10-CM

## 2021-09-22 NOTE — Telephone Encounter (Signed)
I think she can do PT initially and if not improving with this follow up with me or Dr. Darene Lamer.

## 2021-09-22 NOTE — Telephone Encounter (Signed)
Referral place to PT.  Thanks!

## 2021-09-23 ENCOUNTER — Encounter: Payer: Self-pay | Admitting: Family Medicine

## 2021-09-24 ENCOUNTER — Emergency Department (INDEPENDENT_AMBULATORY_CARE_PROVIDER_SITE_OTHER): Payer: Medicare Other

## 2021-09-24 ENCOUNTER — Encounter: Payer: Self-pay | Admitting: Family Medicine

## 2021-09-24 ENCOUNTER — Other Ambulatory Visit: Payer: Self-pay

## 2021-09-24 ENCOUNTER — Emergency Department
Admission: EM | Admit: 2021-09-24 | Discharge: 2021-09-24 | Disposition: A | Discharge status: Home / Self Care | Payer: Medicare Other | Source: Home / Self Care

## 2021-09-24 ENCOUNTER — Telehealth: Payer: Self-pay

## 2021-09-24 DIAGNOSIS — W19XXXA Unspecified fall, initial encounter: Secondary | ICD-10-CM | POA: Diagnosis not present

## 2021-09-24 DIAGNOSIS — M25551 Pain in right hip: Secondary | ICD-10-CM | POA: Diagnosis not present

## 2021-09-24 DIAGNOSIS — M545 Low back pain, unspecified: Secondary | ICD-10-CM | POA: Diagnosis not present

## 2021-09-24 DIAGNOSIS — M5431 Sciatica, right side: Secondary | ICD-10-CM

## 2021-09-24 DIAGNOSIS — R9389 Abnormal findings on diagnostic imaging of other specified body structures: Secondary | ICD-10-CM | POA: Diagnosis not present

## 2021-09-24 DIAGNOSIS — M2578 Osteophyte, vertebrae: Secondary | ICD-10-CM | POA: Diagnosis not present

## 2021-09-24 MED ORDER — HYDROCODONE-ACETAMINOPHEN 5-325 MG PO TABS: 1.0000 | ORAL_TABLET | Freq: Four times a day (QID) | ORAL | 0 refills | Status: DC | PRN

## 2021-09-24 MED ORDER — TRAMADOL HCL 50 MG PO TABS
50.0000 mg | ORAL_TABLET | Freq: Two times a day (BID) | ORAL | 0 refills | Status: DC | PRN
Start: 2021-09-24 — End: 2021-12-02

## 2021-09-24 NOTE — ED Triage Notes (Signed)
Pt states that she has a history of sciatic pain. Pt states that she fell and has some lower  back pain. Pt states that the pain radiates from her lower back down to her right foot. X2 weeks  Right lower back pain.

## 2021-09-24 NOTE — Progress Notes (Signed)
Patient called clinic and reported that she was unable to receive Norco prescription prescribed today as it is on a national back order.  Patient has been notified that we have called in Tramadol instead for her.  Encouraged patient to follow-up with her PCP and oncologist regarding right hip/pelvis x-ray results first thing Monday morning, 09/26/2020.

## 2021-09-24 NOTE — Discharge Instructions (Addendum)
Advised/informed patient of LS spine x-ray results and right hip with pelvis x-ray results.  Advised patient to follow-up with PCP and oncology first thing Monday morning, 09/26/2021 with results of right hip/pelvis x-ray today.  Advised patient may use pain medication daily or as needed.  Encouraged patient to increase daily water intake while taking this medication.

## 2021-09-24 NOTE — ED Provider Notes (Signed)
[name]    CSN: [id number] Arrival date & time: 09/24/21  3300      History   Chief Complaint Chief Complaint  Patient presents with   Back Pain    Back pain x2 weeks    HPI [name] is a 66 y.o. female.   HPI 66 year old female presents with right-sided back pain secondary to fall 2 weeks ago.  Patient reports history of sciatic pain and reports right-sided lower back pain that radiates to right hip, right buttocks, right upper leg down to right foot.  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  Past Medical History:  Diagnosis Date   Anxiety    Asthma    Breast cancer ([place])    Chest pain, atypical    Closed head injury 06/06/2017   Depression    Heart palpitations    hx of MVP   Hypertension    IBS (irritable bowel syndrome)    Pre-diabetes     Patient Active Problem List   Diagnosis Date Noted   Chemotherapy induced neutropenia ([place]) 04/14/2021   Cellulitis 11/07/2020   Stage IV breast cancer in female ([place]) 10/17/2020   Liver metastases ([place]) 10/11/2020   Bone metastases ([place]) 10/10/2020   Elevated LFTs 09/06/2020   Fatigue 09/06/2020   Positive colorectal cancer screening using Cologuard test 09/05/2020   Head injury 04/22/2020   T2DM (type 2 diabetes mellitus) ([place]) 04/19/2020   Redness and swelling of upper arm 03/18/2020   Dizziness 01/01/2020   Chest discomfort 07/30/2019   Increased risk of breast cancer 12/13/2018   Moderate episode of recurrent major depressive disorder ([place]) 08/21/2018   Malignant neoplasm of upper-outer quadrant of right breast in female, estrogen receptor positive ([place]) 07/25/2018   Dysuria 04/30/2018   Positive ANA (antinuclear antibody) 04/15/2018   Elevated CK 04/15/2018   Vitamin D deficiency 04/15/2018   Acute stress reaction 04/15/2018   Abnormal fasting glucose 10/11/2017   Seborrheic dermatitis 07/19/2017   Allergic conjunctivitis 07/19/2017   Gastritis 06/27/2017    BMI 30.0-30.9,adult 05/03/2017   Palpitation 08/29/2016   Insomnia 09/28/2015   Asthma, persistent controlled 02/26/2014   Eczematous dermatitis 02/26/2014   Prediabetes 02/26/2014   Hyperlipidemia 12/31/2013   Essential hypertension 08/23/2012   GAD (generalized anxiety disorder) 05/01/2012    Past Surgical History:  Procedure Laterality Date   BREAST SURGERY  06/2018   right and left breast   eAB     x2    MASTECTOMY MODIFIED RADICAL Bilateral 10/10/2018   Procedure: RIGHT MODIFIED RADICAL MASTECTOMY AND LEFT PROPHYLATIC MASTECTOMY;  Surgeon: [name], MD;  Location: [facility];  Service: General;  Laterality: Bilateral;   PORTACATH PLACEMENT Left 08/12/2018   Procedure: INSERTION PORT-A-CATH;  Surgeon: [name], MD;  Location: [facility];  Service: General;  Laterality: Left;    OB History     Gravida  3   Para  1   Term  1   Preterm      AB  2   Living  1      SAB      IAB  2   Ectopic      Multiple      Live Births               Home Medications    Prior to Admission medications   Medication Sig Start Date End Date Taking? Authorizing Provider  acetaminophen (TYLENOL) 500 MG chewable tablet  Chew by mouth.   Yes [provider]  albuterol (VENTOLIN HFA) 108 (90 Base) MCG/ACT inhaler Inhale 2 puffs into the lungs every 6 (six) hours as needed for wheezing or shortness of breath. 08/05/19  Yes Silverio Decamp, MD  aspirin EC 81 MG tablet Take 81 mg by mouth 4 (four) times a week.   Yes [provider]  B Complex-C (B-COMPLEX WITH VITAMIN C) tablet Take 1 tablet by mouth daily after lunch.   Yes [provider]  Calcium-Magnesium (CAL-MAG PO) Take 1 tablet by mouth 2 (two) times daily.   Yes [provider]  CHLOROPHYLL PO Take 1 tablet by mouth every other day.   Yes [provider]  citalopram (CELEXA) 10 MG tablet TAKE 1 TABLET BY MOUTH EVERY DAY 06/15/20  Yes Luetta Nutting,  DO  HYDROcodone-acetaminophen (NORCO) 5-325 MG tablet Take 1 tablet by mouth every 6 (six) hours as needed for moderate pain. 09/24/21  Yes Eliezer Lofts, FNP  loratadine (CLARITIN) 10 MG tablet Take 1 tablet (10 mg total) by mouth daily. Patient taking differently: Take 10 mg by mouth daily as needed for allergies. 12/28/17  Yes Silverio Decamp, MD  ondansetron (ZOFRAN ODT) 8 MG disintegrating tablet Take 1 tablet (8 mg total) by mouth every 8 (eight) hours as needed for nausea or vomiting. 09/28/20  Yes Luetta Nutting, DO  triamcinolone cream (KENALOG) 0.5 % Apply 1 application topically 2 (two) times daily. To affected areas. 04/24/19  Yes Gregor Hams, MD  Vitamin D-Vitamin K (D3 + K2 DOTS PO) Take 1 tablet by mouth daily.   Yes [provider]  budesonide-formoterol (SYMBICORT) 160-4.5 MCG/ACT inhaler Inhale 2 puffs into the lungs 2 (two) times daily. 11/09/20 02/07/21  Luetta Nutting, DO    Family History Family History  Problem Relation Age of Onset   Heart attack Father    Lung cancer Mother    Bladder Cancer Brother     Social History Social History   Tobacco Use   Smoking status: Never   Smokeless tobacco: Never   Tobacco comments:    nonsmoker  Vaping Use   Vaping Use: Never used  Substance Use Topics   Alcohol use: No   Drug use: No     Allergies   Penicillins, Sulfa antibiotics, and Effexor [venlafaxine]   Review of Systems Review of Systems  Musculoskeletal:  Positive for back pain.    Physical Exam Triage Vital Signs ED Triage Vitals  Enc Vitals Group     BP 09/24/21 0946 (!) 148/70     Pulse Rate 09/24/21 0946 84     Resp 09/24/21 0946 18     Temp 09/24/21 0946 98.5 F (36.9 C)     Temp Source 09/24/21 0946 Oral     SpO2 09/24/21 0946 97 %     Weight 09/24/21 0943 171 lb (77.6 kg)     Height 09/24/21 0943 5\' 4"  (1.626 m)     Head Circumference --      Peak Flow --      Pain Score 09/24/21 0943 7     Pain Loc --      Pain Edu? --       Excl. in [place]? --    No data found.  Updated Vital Signs BP (!) 148/70 (BP Location: Left Arm)    Pulse 84    Temp 98.5 F (36.9 C) (Oral)    Resp 18    Ht 5\' 4"  (1.626 m)  Wt 171 lb (77.6 kg)    SpO2 97%    BMI 29.35 kg/m      Physical Exam Vitals and nursing note reviewed.  Constitutional:      General: She is not in acute distress.    Appearance: Normal appearance. She is obese. She is not ill-appearing.  HENT:     Head: Normocephalic and atraumatic.     Mouth/Throat:     Mouth: Mucous membranes are moist.     Pharynx: Oropharynx is clear.  Eyes:     Extraocular Movements: Extraocular movements intact.     Conjunctiva/sclera: Conjunctivae normal.     Pupils: Pupils are equal, round, and reactive to light.  Cardiovascular:     Rate and Rhythm: Normal rate and regular rhythm.     Pulses: Normal pulses.     Heart sounds: Normal heart sounds.  Pulmonary:     Effort: Pulmonary effort is normal.     Breath sounds: Normal breath sounds.  Musculoskeletal:     Cervical back: Normal range of motion.     Comments: Right-sided LS spine/right lateral inferior hip: TTP, no soft tissue swelling, no deformity, patient reports radicular pain from right-sided LS spine, right hip, right buttocks, right upper leg radiating to right foot  Skin:    General: Skin is warm and dry.  Neurological:     General: No focal deficit present.     Mental Status: She is alert and oriented to person, place, and time.     UC Treatments / Results  Labs (all labs ordered are listed, but only abnormal results are displayed) Labs Reviewed - No data to display  EKG   Radiology DG Lumbar Spine Complete  Result Date: 09/24/2021 CLINICAL DATA:  Fall 2 weeks ago.  Right-sided low back pain. EXAM: LUMBAR SPINE - COMPLETE 4+ VIEW COMPARISON:  None. FINDINGS: There is no evidence of lumbar spine fracture. Mild degenerative disc disease is seen at L5-S1. Bilateral facet DJD is seen at L5-S1, with grade 1  anterolisthesis measuring approximately 6 mm. Mild vertebral osteophyte formation at L3-4, without joint space narrowing. No other osseous abnormality identified. Aortic atherosclerotic calcification noted. IMPRESSION: No acute findings. Degenerative spondylosis, as described above, with grade 1 degenerative anterolisthesis at L5-S1. Electronically Signed   By: Marlaine Hind M.D.   On: 09/24/2021 10:43   DG Hip Unilat W or Wo Pelvis 2-3 Views Right  Result Date: 09/24/2021 CLINICAL DATA:  Fall 2 weeks ago. Right hip pain. Personal history of breast carcinoma. EXAM: DG HIP (WITH OR WITHOUT PELVIS) 2-3V RIGHT COMPARISON:  CT on 08/04/2018 FINDINGS: There is no evidence of hip fracture or dislocation. There is no evidence of hip joint arthropathy. Large sclerotic bone lesion is seen involving the right ischial tuberosity and a smaller sclerotic area is seen in the left inferior pubic ramus, which are new since prior CT. These are suspicious for bone metastases. IMPRESSION: No evidence of right hip fracture or dislocation. Sclerotic bone lesions in right ischial tuberosity and left inferior pubic ramus, suspicious for bone metastases. Electronically Signed   By: Marlaine Hind M.D.   On: 09/24/2021 10:47    Procedures Procedures (including critical care time)  Medications Ordered in UC Medications - No data to display  Initial Impression / Assessment and Plan / UC Course  I have reviewed the triage vital signs and the nursing notes.  Pertinent labs & imaging results that were available during my care of the patient were reviewed by me and  considered in my medical decision making (see chart for details).     MDM: 1.  Sciatica of right side-LS-spine revealed no evidence of lumbar spine fracture.  Degenerative spondylosis with grade 1 degenerative anterolisthesis measuring approximately 6 mm at L5-S1; 2.  Right hip pain-no evidence of right hip fracture or dislocation, sclerotic bone lesions in right  ischial tuberosity and left inferior pubic ramus, suspicious for bone metastasis.  Rx'd Norco. Advised/informed patient of LS spine x-ray results and right hip with pelvis x-ray results.  Advised patient to follow-up with PCP and oncology first thing Monday morning, 09/26/2021 with results of right hip/pelvis x-ray today.  Advised patient may use pain medication daily or as needed.  Encouraged patient to increase daily water intake while taking this medication.  Patient discharged home, hemodynamically stable. Final Clinical Impressions(s) / UC Diagnoses   Final diagnoses:  Sciatica of right side  Right hip pain     Discharge Instructions      Advised/informed patient of LS spine x-ray results and right hip with pelvis x-ray results.  Advised patient to follow-up with PCP and oncology first thing Monday morning, 09/26/2021 with results of right hip/pelvis x-ray today.  Advised patient may use pain medication daily or as needed.  Encouraged patient to increase daily water intake while taking this medication.     ED Prescriptions     Medication Sig Dispense Auth. Provider   HYDROcodone-acetaminophen (NORCO) 5-325 MG tablet Take 1 tablet by mouth every 6 (six) hours as needed for moderate pain. 30 tablet Eliezer Lofts, FNP      I have reviewed the PDMP during this encounter.   Eliezer Lofts, [place] 09/24/21 1121

## 2021-09-24 NOTE — Telephone Encounter (Signed)
Pt calls [facility] to report that pharmacy is out of [place] which Kathi Ludwig, FNP prescribed at today's visit. Kathi Ludwig, FNP notified and states he is submitting Tramadol Rx to pt's pharmacy. Pt notified and verbalizes understanding.

## 2021-09-26 ENCOUNTER — Encounter: Payer: Self-pay | Admitting: Family Medicine

## 2021-09-26 ENCOUNTER — Ambulatory Visit: Payer: Medicare Other | Admitting: Sports Medicine

## 2021-09-26 ENCOUNTER — Encounter: Payer: Self-pay | Admitting: Rehabilitative and Restorative Service Providers"

## 2021-09-27 ENCOUNTER — Ambulatory Visit: Payer: Medicare Other | Attending: Family Medicine | Admitting: Rehabilitative and Restorative Service Providers"

## 2021-09-27 ENCOUNTER — Encounter: Payer: Self-pay | Admitting: Rehabilitative and Restorative Service Providers"

## 2021-09-27 ENCOUNTER — Telehealth: Payer: Self-pay | Admitting: *Deleted

## 2021-09-27 ENCOUNTER — Other Ambulatory Visit: Payer: Self-pay

## 2021-09-27 DIAGNOSIS — M5431 Sciatica, right side: Secondary | ICD-10-CM | POA: Diagnosis not present

## 2021-09-27 DIAGNOSIS — R2689 Other abnormalities of gait and mobility: Secondary | ICD-10-CM | POA: Diagnosis not present

## 2021-09-27 DIAGNOSIS — R29898 Other symptoms and signs involving the musculoskeletal system: Secondary | ICD-10-CM

## 2021-09-27 DIAGNOSIS — M5430 Sciatica, unspecified side: Secondary | ICD-10-CM | POA: Diagnosis present

## 2021-09-27 NOTE — Therapy (Signed)
[facility] [place], [place], [address] Phone: [phone]   Fax:  [phone]  Physical Therapy Evaluation  Patient Details  Name: [name] MRN: [id number] Date of Birth: 1956-02-23 Referring Provider (PT): Dr Luetta Nutting   Encounter Date: 09/27/2021   PT End of Session - 09/27/21 1235     Visit Number 1    Number of Visits 12    Date for PT Re-Evaluation 11/08/21    Authorization Type [employer] Medicare    Progress Note Due on Visit 10    PT Start Time 0930    PT Stop Time 1018    PT Time Calculation (min) 48 min    Activity Tolerance Patient tolerated treatment well;Patient limited by pain             Past Medical History:  Diagnosis Date   Anxiety    Asthma    Breast cancer ([place])    Chest pain, atypical    Closed head injury 06/06/2017   Depression    Heart palpitations    hx of MVP   Hypertension    IBS (irritable bowel syndrome)    Pre-diabetes     Past Surgical History:  Procedure Laterality Date   BREAST SURGERY  06/2018   right and left breast   eAB     x2    MASTECTOMY MODIFIED RADICAL Bilateral 10/10/2018   Procedure: RIGHT MODIFIED RADICAL MASTECTOMY AND LEFT PROPHYLATIC MASTECTOMY;  Surgeon: [name], MD;  Location[facility];  Service: General;  Laterality: Bilateral;   PORTACATH PLACEMENT Left 08/12/2018   Procedure: INSERTION PORT-A-CATH;  Surgeon: [name], MD;  Location: [facility];  Service: General;  Laterality: Left;    There were no vitals filed for this visit.    Subjective Assessment - 09/27/21 0935     Subjective Patient reports that she has had sciatica for 12 years with increase of symptoms since 09/08/21 when she fell down steps. She has had increasing symptoms since the fall. Patient is having pain in bilat LE's and Rt side and Rt upper trap.    Pertinent History Breast Cancer 2019; cancer under control; HTN; foot problems Lt cyst  like on plantar surface    Patient Stated Goals decreas pain and be able to walk better    Currently in Pain? Yes    Pain Score 9     Pain Orientation Right;Left    Pain Descriptors / Indicators Sharp;Shooting    Pain Type Acute pain;Chronic pain    Pain Radiating Towards Rt side and shoulder down the Rt posterior hip/sciatic arealateral thigh into calf and feet and sometimes feels cold    Pain Onset More than a month ago    Pain Frequency Constant    Aggravating Factors  sitting; standing; walking, any movement    Pain Relieving Factors nothing                [facility] PT Assessment - 09/27/21 0001       Assessment   Medical Diagnosis Rt sciatica; Rt LB; Rt side    Referring Provider (PT) Dr Luetta Nutting    Onset Date/Surgical Date 09/08/21   sciatica pain x 12 years   Hand Dominance Right    Next MD Visit PRN    Prior Therapy none      Precautions   Precautions Other (comment)    Precaution Comments Cancer      Restrictions  Weight Bearing Restrictions No      Balance Screen   Has the patient fallen in the past 6 months Yes    How many times? 1    Has the patient had a decrease in activity level because of a fear of falling?  Yes    Is the patient reluctant to leave their home because of a fear of falling?  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/day 4 days/wk    Leisure walking dog; household chores      Observation/Other Assessments   Focus on Therapeutic Outcomes (FOTO)  not completed      Sensation   Additional Comments tingling and sensation of cold in the Rt > Lt leg and foot      Posture/Postural Control   Posture Comments head forward; shoulders rounded and elevated       AROM   Lumbar Flexion 70% painful Rt    Lumbar Extension neutral pain Rt posterior hip    Lumbar - Right Side Bend 60% deviation to Lt pain Rt posterior hip    Lumbar - Left Side Bend 70% forward flexed posture pulling Rt posterior hip    Lumbar - Right Rotation 40% increased pain in Rt posterior hip    Lumbar - Left Rotation 35% increased pain in the Rt posterior hip      Strength   Overall Strength Comments pt unable to tolerate strength testing due to pain      Flexibility   Hamstrings unable to test Rt LE due to pain: Lt ~ 70 deg    Piriformis unable to test Rt LE; tight on Lt      Palpation   Palpation comment hypersensitive to all touch - unalbe to assess muscular tightness      Balance   Balance Assessed --   SLS Lt 10 sec; Rt 8 sec with increased pain in Rt posterior hip     Functional Gait  Assessment   Gait assessed  --   abnormal gait pattern - antalgic gait with decreased wt bearing Rt LE with stance phase Rt; Rt LE in ER, ambulating on toe Rt at times                       Objective measurements completed on examination: See above findings.       [facility] Adult PT Treatment/Exercise - 09/27/21 0001       Knee/Hip Exercises: Stretches   Other Knee/Hip Stretches bent knee fall outs x 10 reps    Other Knee/Hip Stretches trunk rotation supine 10 sec hold x 3 reps      Knee/Hip Exercises: Seated   Other Seated Knee/Hip Exercises lateral trunk flexion to pt tolerance 10 sec x 3 reps each side      Knee/Hip Exercises: Supine   Other Supine Knee/Hip Exercises diaphragatic breathing in to 4 to 6 count; hold 4 to 6 count; exhale 4-6 count                     PT Education - 09/27/21 1012     Education Details POC; HEP; posture  Person(s) Educated Patient    Methods Explanation;Demonstration;Tactile cues;Verbal cues;Handout    Comprehension Verbalized understanding;Returned demonstration;Verbal cues required;Tactile cues required                  PT Long Term Goals - 09/27/21 1249       PT LONG TERM GOAL #1   Title Further assessment of defecits and limitations as indicated and patient tolerates to progress with rehab plan    Time 6    Period Weeks    Status New    Target Date 11/08/21      PT LONG TERM GOAL #2   Title Decrease pain in the Rt side including pain in the Rt shoulder, flank, posterior hip and LE by 50-60% allowing patient to increase functional activity level    Time 6    Period Weeks    Status New    Target Date 11/08/21      PT LONG TERM GOAL #3   Title Patient to tolerant 20-30 minutes of exercise/functional activities with minimal to no increase in pain level    Time 6    Period Weeks    Status New    Target Date 11/08/21      PT LONG TERM GOAL #4   Title Independent in HEP (including aquatic program as indicated)    Time 6    Period Weeks    Status New    Target Date 11/08/21      PT LONG TERM GOAL #5   Title Improve gait pattern with patient to demonstrate more normal gait pattern with equal weight bearing bilat LE's in stance phase of gait    Time 6    Period Weeks    Status New    Target Date 11/08/21                    Plan - 09/27/21 1243     Clinical Impression Statement Patient presents with Rt > Lt posterior hip and Rt LE pain with 12 year history of sciatica pain. She reports significant increase in Rt posterior hip pain as well as Rt flank and shoudler pain following a fall on her steps 09/08/21. She has antalgic gait pattern; limited trunk and LE mobility/ROM; pain with functional activities including transfers and transitional movements. Patient is unable to tolerate lying sidelying or prone for full evaluation. She was unable to tolerate resistive strength testing or palpation of soft tissues. Patient reports severe pain at 8-9 on a 0-10 pain scale. She will benefit from PT to address problems identified. Further assessment of limitations will be helpful as  patient tolerates.    Stability/Clinical Decision Making Stable/Uncomplicated    Clinical Decision Making Low    Rehab Potential Fair    PT Frequency 2x / week    PT Duration 6 weeks    PT Treatment/Interventions ADLs/Self Care Home Management;Aquatic Therapy;Cryotherapy;Iontophoresis 4mg /ml Dexamethasone;Moist Heat;Gait training;Stair training;Functional mobility training;Therapeutic activities;Therapeutic exercise;Balance training;Neuromuscular re-education;Patient/family education;Manual techniques;Dry needling;Taping    PT Next Visit Plan further assessment as pt tolerates; review and progress with exercise as patient tolerates; manual work as tolerated; normalization of movement; heat/cold as indicated - not a canditate for modalities due to history of cancer    PT Home Exercise Plan [id number]    Recommended Other Services No modalities due to history of cancer    Consulted and Agree with Plan of Care Patient             Patient will benefit from  skilled therapeutic intervention in order to improve the following deficits and impairments:  Abnormal gait, Decreased range of motion, Decreased activity tolerance, Pain, Impaired flexibility, Improper body mechanics, Decreased mobility, Postural dysfunction  Visit Diagnosis: Sciatica, right side  Other symptoms and signs involving the musculoskeletal system  Other abnormalities of gait and mobility     Problem List Patient Active Problem List   Diagnosis Date Noted   Chemotherapy induced neutropenia ([place]) 04/14/2021   Cellulitis 11/07/2020   Stage IV breast cancer in female [facility]) 10/17/2020   Liver metastases ([place]) 10/11/2020   Bone metastases ([place]) 10/10/2020   Elevated LFTs 09/06/2020   Fatigue 09/06/2020   Positive colorectal cancer screening using Cologuard test 09/05/2020   Head injury 04/22/2020   T2DM (type 2 diabetes mellitus) ([place]) 04/19/2020   Redness and swelling of upper arm 03/18/2020   Dizziness 01/01/2020    Chest discomfort 07/30/2019   Increased risk of breast cancer 12/13/2018   Moderate episode of recurrent major depressive disorder ([place]) 08/21/2018   Malignant neoplasm of upper-outer quadrant of right breast in female, estrogen receptor positive ([place]) 07/25/2018   Dysuria 04/30/2018   Positive ANA (antinuclear antibody) 04/15/2018   Elevated CK 04/15/2018   Vitamin D deficiency 04/15/2018   Acute stress reaction 04/15/2018   Abnormal fasting glucose 10/11/2017   Seborrheic dermatitis 07/19/2017   Allergic conjunctivitis 07/19/2017   Gastritis 06/27/2017   BMI 30.0-30.9,adult 05/03/2017   Palpitation 08/29/2016   Insomnia 09/28/2015   Asthma, persistent controlled 02/26/2014   Eczematous dermatitis 02/26/2014   Prediabetes 02/26/2014   Hyperlipidemia 12/31/2013   Essential hypertension 08/23/2012   GAD (generalized anxiety disorder) 05/01/2012    Chiquita Heckert Nilda Simmer, PT, MPH 09/27/2021, 12:55 PM  [facility] [place], [place], [address] Phone: [phone]   Fax:  [phone]  Name: [name] MRN: [id number] Date of Birth: [birth year]

## 2021-09-27 NOTE — Patient Instructions (Addendum)
Sleeping on Back  Place pillow under knees. A pillow with cervical support and a roll around waist are also helpful. Copyright  VHI. All rights reserved.  Sleeping on Side Place pillow between knees. Use cervical support under neck and a roll around waist as needed. Copyright  VHI. All rights reserved.   Sleeping on Stomach   If this is the only desirable sleeping position, place pillow under lower legs, and under stomach or chest as needed.  Posture - Sitting   Sit upright, head facing forward. Try using a roll to support lower back. Keep shoulders relaxed, and avoid rounded back. Keep hips level with knees. Avoid crossing legs for long periods. Stand to Sit / Sit to Stand   To sit: Bend knees to lower self onto front edge of chair, then scoot back on seat. To stand: Reverse sequence by placing one foot forward, and scoot to front of seat. Use rocking motion to stand up.   Work Height and Reach  Ideal work height is no more than 2 to 4 inches below elbow level when standing, and at elbow level when sitting. Reaching should be limited to arm's length, with elbows slightly bent.  Bending  Bend at hips and knees, not back. Keep feet shoulder-width apart.    Posture - Standing   Good posture is important. Avoid slouching and forward head thrust. Maintain curve in low back and align ears over shoul- ders, hips over ankles.  Alternating Positions   Alternate tasks and change positions frequently to reduce fatigue and muscle tension. Take rest breaks. Computer Work   Position work to Programmer, multimedia. Use proper work and seat height. Keep shoulders back and down, wrists straight, and elbows at right angles. Use chair that provides full back support. Add footrest and lumbar roll as needed.  Getting Into / Out of Car  Lower self onto seat, scoot back, then bring in one leg at a time. Reverse sequence to get out.  Dressing  Lie on back to pull socks or slacks over feet, or sit  and bend leg while keeping back straight.    Housework - Sink  Place one foot on ledge of cabinet under sink when standing at sink for prolonged periods.   Pushing / Pulling  Pushing is preferable to pulling. Keep back in proper alignment, and use leg muscles to do the work.  Deep Squat   Squat and lift with both arms held against upper trunk. Tighten stomach muscles without holding breath. Use smooth movements to avoid jerking.  Avoid Twisting   Avoid twisting or bending back. Pivot around using foot movements, and bend at knees if needed when reaching for articles.  Carrying Luggage   Distribute weight evenly on both sides. Use a cart whenever possible. Do not twist trunk. Move body as a unit.   Lifting Principles Maintain proper posture and head alignment. Slide object as close as possible before lifting. Move obstacles out of the way. Test before lifting; ask for help if too heavy. Tighten stomach muscles without holding breath. Use smooth movements; do not jerk. Use legs to do the work, and pivot with feet. Distribute the work load symmetrically and close to the center of trunk. Push instead of pull whenever possible.   Ask For Help   Ask for help and delegate to others when possible. Coordinate your movements when lifting together, and maintain the low back curve.  Log Roll   Lying on back, bend left knee and place left  arm across chest. Roll all in one movement to the right. Reverse to roll to the left. Always move as one unit. Housework - Sweeping  Use long-handled equipment to avoid stooping.   Housework - Wiping  Position yourself as close as possible to reach work surface. Avoid straining your back.  Laundry - Unloading Wash   To unload small items at bottom of washer, lift leg opposite to arm being used to reach.  [place] close to area to be raked. Use arm movements to do the work. Keep back straight and avoid twisting.      Cart  When reaching into cart with one arm, lift opposite leg to keep back straight.   Getting Into / Out of Bed  Lower self to lie down on one side by raising legs and lowering head at the same time. Use arms to assist moving without twisting. Bend both knees to roll onto back if desired. To sit up, start from lying on side, and use same move-ments in reverse. Housework - Vacuuming  Hold the vacuum with arm held at side. Step back and forth to move it, keeping head up. Avoid twisting.   Laundry - IT consultant so that bending and twisting can be avoided.   Laundry - Unloading Dryer  Squat down to reach into clothes dryer or use a reacher.  Gardening - Weeding / Probation officer or Kneel. Knee pads may be helpful.                    Access Code: [email or web address] URL: [email or web address] Date: 09/27/2021 Prepared by: Gillermo Murdoch  Exercises Bent Knee Fallouts - 2 x daily - 7 x weekly - 1-2 sets - 10 reps - 2-3 sec hold Supine Lower Trunk Rotation - 2 x daily - 7 x weekly - 1 sets - 3-5 reps - 20-30 sec hold Seated Sidebending - 2 x daily - 7 x weekly - 1 sets - 3 reps - 30 sec hold Supine Shoulder Flexion AAROM - 2 x daily - 7 x weekly - 1 sets - 5-10 reps - 10-15 sec hold Supine Diaphragmatic Breathing - 2 x daily - 7 x weekly - 1 sets - 10 reps - 4-6 sec hold

## 2021-09-27 NOTE — Telephone Encounter (Signed)
Pt called questioning if she should continue PT. She has a follow up appt tomorrow with Dr T and her oncologist. Advised her to discuss this with both of these specialist tomorrow. Pt verbalized understanding.

## 2021-09-28 ENCOUNTER — Ambulatory Visit (INDEPENDENT_AMBULATORY_CARE_PROVIDER_SITE_OTHER): Payer: Medicare Other | Admitting: Sports Medicine

## 2021-09-28 ENCOUNTER — Encounter: Payer: Self-pay | Admitting: Sports Medicine

## 2021-09-28 DIAGNOSIS — C787 Secondary malignant neoplasm of liver and intrahepatic bile duct: Secondary | ICD-10-CM | POA: Diagnosis not present

## 2021-09-28 DIAGNOSIS — C7951 Secondary malignant neoplasm of bone: Secondary | ICD-10-CM | POA: Diagnosis not present

## 2021-09-28 DIAGNOSIS — R7989 Other specified abnormal findings of blood chemistry: Secondary | ICD-10-CM | POA: Diagnosis not present

## 2021-09-28 DIAGNOSIS — M5416 Radiculopathy, lumbar region: Secondary | ICD-10-CM

## 2021-09-28 DIAGNOSIS — Z5111 Encounter for antineoplastic chemotherapy: Secondary | ICD-10-CM | POA: Diagnosis not present

## 2021-09-28 DIAGNOSIS — Z9189 Other specified personal risk factors, not elsewhere classified: Secondary | ICD-10-CM | POA: Diagnosis not present

## 2021-09-28 DIAGNOSIS — I10 Essential (primary) hypertension: Secondary | ICD-10-CM | POA: Diagnosis not present

## 2021-09-28 DIAGNOSIS — Z170 Estrogen receptor positive status [ER+]: Secondary | ICD-10-CM | POA: Diagnosis not present

## 2021-09-28 DIAGNOSIS — M5431 Sciatica, right side: Secondary | ICD-10-CM | POA: Diagnosis not present

## 2021-09-28 DIAGNOSIS — C50811 Malignant neoplasm of overlapping sites of right female breast: Secondary | ICD-10-CM | POA: Diagnosis not present

## 2021-09-28 MED ORDER — PREDNISONE 50 MG PO TABS: ORAL_TABLET | ORAL | 0 refills | Status: DC

## 2021-09-28 NOTE — Progress Notes (Signed)
? ? ?  Procedures performed today:   ? ?None. ? ?Independent interpretation of notes and tests performed by another provider:  ? 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 ? ?Brief History, Exam, Impression, and Recommendations:   ? ?Left lumbar radiculitis ?[name] is a pleasant 66 year old female, history of metastatic breast cancer, sounds like she did have a sclerotic lesion right ischial tuberosity on recent x-rays, PET scan did show that this was a cold lesion. ?She is having back pain with radiation down the right leg to the dorsum of the right foot, i.e. an L5 distribution. ?Pain is worse with sitting, flexion, Valsalva and gating her axial component is discogenic. ?I explained to her the anatomy, evolutionary anthropology of disc disease, we went over her x-rays. ?We will start conservatively with 5 days of prednisone, she can hold off on her prednisone dose tomorrow when she gets steroids for premedication. ?She will continue afterwards. ?She can discontinue her formal PT which she did not like, and I will switch her to a home exercise program. ?We will do all of the above for 6 weeks before considering an MRI for interventional planning which will likely be a right L5-S1 transforaminal epidural depending on what her MRI shows. ? ?Chronic process with exacerbation and pharmacologic intervention ? ? ?___________________________________________ ?Gwen Her. Dianah Field, M.D., ABFM., CAQSM. ?Primary Care and Sports Medicine ?[place] ? ?Adjunct Instructor of Family Medicine  ?[employer] ?

## 2021-09-28 NOTE — Assessment & Plan Note (Signed)
[name] is a pleasant 66 year old female, history of metastatic breast cancer, sounds like she did have a sclerotic lesion right ischial tuberosity on recent x-rays, PET scan did show that this was a cold lesion. ?She is having back pain with radiation down the right leg to the dorsum of the right foot, i.e. an L5 distribution. ?Pain is worse with sitting, flexion, Valsalva and gating her axial component is discogenic. ?I explained to her the anatomy, evolutionary anthropology of disc disease, we went over her x-rays. ?We will start conservatively with 5 days of prednisone, she can hold off on her prednisone dose tomorrow when she gets steroids for premedication. ?She will continue afterwards. ?She can discontinue her formal PT which she did not like, and I will switch her to a home exercise program. ?We will do all of the above for 6 weeks before considering an MRI for interventional planning which will likely be a right L5-S1 transforaminal epidural depending on what her MRI shows. ?

## 2021-09-29 ENCOUNTER — Encounter: Payer: Self-pay | Admitting: Sports Medicine

## 2021-09-29 ENCOUNTER — Telehealth: Payer: Self-pay | Admitting: Sports Medicine

## 2021-09-29 DIAGNOSIS — Z170 Estrogen receptor positive status [ER+]: Secondary | ICD-10-CM | POA: Diagnosis not present

## 2021-09-29 DIAGNOSIS — C7951 Secondary malignant neoplasm of bone: Secondary | ICD-10-CM | POA: Diagnosis not present

## 2021-09-29 DIAGNOSIS — C50811 Malignant neoplasm of overlapping sites of right female breast: Secondary | ICD-10-CM | POA: Diagnosis not present

## 2021-09-29 DIAGNOSIS — Z5111 Encounter for antineoplastic chemotherapy: Secondary | ICD-10-CM | POA: Diagnosis not present

## 2021-09-29 DIAGNOSIS — Z5112 Encounter for antineoplastic immunotherapy: Secondary | ICD-10-CM | POA: Diagnosis not present

## 2021-09-29 DIAGNOSIS — C787 Secondary malignant neoplasm of liver and intrahepatic bile duct: Secondary | ICD-10-CM | POA: Diagnosis not present

## 2021-09-29 NOTE — Telephone Encounter (Signed)
If the oncologist thinks she needs to go to pain management then the oncologist can refer her, otherwise from my perspective no change in the plan that she and I had already discussed. ?

## 2021-09-29 NOTE — Telephone Encounter (Signed)
Dr. Darene Lamer  ? ? [name] called and stated her oncologist said she needed to see a pain specialist and [name] disagrees she said she is confident you can handle her issue. She wanted to know what  your thoughts were and if she needed to go to a pain specialist.  ? ?[name]  ?

## 2021-09-30 ENCOUNTER — Encounter: Payer: Self-pay | Admitting: Sports Medicine

## 2021-09-30 ENCOUNTER — Encounter: Payer: Self-pay | Admitting: Family Medicine

## 2021-09-30 ENCOUNTER — Ambulatory Visit: Payer: Medicare Other | Admitting: Rehabilitative and Restorative Service Providers"

## 2021-09-30 NOTE — Telephone Encounter (Signed)
I sent patient  a MyChart message letting her know what Dr. Darene Lamer has said. -CF ?

## 2021-10-02 ENCOUNTER — Encounter: Payer: Self-pay | Admitting: Sports Medicine

## 2021-10-04 ENCOUNTER — Ambulatory Visit: Payer: Medicare Other | Admitting: Rehabilitative and Restorative Service Providers"

## 2021-10-04 DIAGNOSIS — G8929 Other chronic pain: Secondary | ICD-10-CM | POA: Diagnosis not present

## 2021-10-04 DIAGNOSIS — M5136 Other intervertebral disc degeneration, lumbar region: Secondary | ICD-10-CM | POA: Diagnosis not present

## 2021-10-04 DIAGNOSIS — M4726 Other spondylosis with radiculopathy, lumbar region: Secondary | ICD-10-CM | POA: Diagnosis not present

## 2021-10-04 DIAGNOSIS — M4316 Spondylolisthesis, lumbar region: Secondary | ICD-10-CM | POA: Diagnosis not present

## 2021-10-04 DIAGNOSIS — M7918 Myalgia, other site: Secondary | ICD-10-CM | POA: Diagnosis not present

## 2021-10-04 DIAGNOSIS — I10 Essential (primary) hypertension: Secondary | ICD-10-CM | POA: Diagnosis not present

## 2021-10-04 DIAGNOSIS — M461 Sacroiliitis, not elsewhere classified: Secondary | ICD-10-CM | POA: Diagnosis not present

## 2021-10-04 DIAGNOSIS — M5459 Other low back pain: Secondary | ICD-10-CM | POA: Diagnosis not present

## 2021-10-04 DIAGNOSIS — M48061 Spinal stenosis, lumbar region without neurogenic claudication: Secondary | ICD-10-CM | POA: Diagnosis not present

## 2021-10-05 DIAGNOSIS — C50811 Malignant neoplasm of overlapping sites of right female breast: Secondary | ICD-10-CM | POA: Diagnosis not present

## 2021-10-05 DIAGNOSIS — C787 Secondary malignant neoplasm of liver and intrahepatic bile duct: Secondary | ICD-10-CM | POA: Diagnosis not present

## 2021-10-05 DIAGNOSIS — C7951 Secondary malignant neoplasm of bone: Secondary | ICD-10-CM | POA: Diagnosis not present

## 2021-10-05 DIAGNOSIS — Z170 Estrogen receptor positive status [ER+]: Secondary | ICD-10-CM | POA: Diagnosis not present

## 2021-10-06 DIAGNOSIS — C787 Secondary malignant neoplasm of liver and intrahepatic bile duct: Secondary | ICD-10-CM | POA: Diagnosis not present

## 2021-10-06 DIAGNOSIS — C7951 Secondary malignant neoplasm of bone: Secondary | ICD-10-CM | POA: Diagnosis not present

## 2021-10-06 DIAGNOSIS — C50811 Malignant neoplasm of overlapping sites of right female breast: Secondary | ICD-10-CM | POA: Diagnosis not present

## 2021-10-06 DIAGNOSIS — Z5112 Encounter for antineoplastic immunotherapy: Secondary | ICD-10-CM | POA: Diagnosis not present

## 2021-10-06 DIAGNOSIS — Z170 Estrogen receptor positive status [ER+]: Secondary | ICD-10-CM | POA: Diagnosis not present

## 2021-10-07 ENCOUNTER — Ambulatory Visit: Payer: Medicare Other | Admitting: Rehabilitative and Restorative Service Providers"

## 2021-10-07 DIAGNOSIS — C787 Secondary malignant neoplasm of liver and intrahepatic bile duct: Secondary | ICD-10-CM | POA: Diagnosis not present

## 2021-10-07 DIAGNOSIS — C50811 Malignant neoplasm of overlapping sites of right female breast: Secondary | ICD-10-CM | POA: Diagnosis not present

## 2021-10-07 DIAGNOSIS — Z170 Estrogen receptor positive status [ER+]: Secondary | ICD-10-CM | POA: Diagnosis not present

## 2021-10-07 DIAGNOSIS — C7951 Secondary malignant neoplasm of bone: Secondary | ICD-10-CM | POA: Diagnosis not present

## 2021-10-07 DIAGNOSIS — Z5111 Encounter for antineoplastic chemotherapy: Secondary | ICD-10-CM | POA: Diagnosis not present

## 2021-10-10 ENCOUNTER — Encounter: Payer: Self-pay | Admitting: Family Medicine

## 2021-10-11 ENCOUNTER — Ambulatory Visit: Payer: Medicare Other | Attending: Family Medicine | Admitting: Physical Therapy

## 2021-10-11 ENCOUNTER — Other Ambulatory Visit: Payer: Self-pay

## 2021-10-11 DIAGNOSIS — R2689 Other abnormalities of gait and mobility: Secondary | ICD-10-CM | POA: Insufficient documentation

## 2021-10-11 DIAGNOSIS — R29898 Other symptoms and signs involving the musculoskeletal system: Secondary | ICD-10-CM | POA: Diagnosis not present

## 2021-10-11 DIAGNOSIS — M5431 Sciatica, right side: Secondary | ICD-10-CM | POA: Insufficient documentation

## 2021-10-11 NOTE — Therapy (Addendum)
[facility] [place] [address] [place], [place], [address] Phone: [phone]   Fax:  [phone]  Physical Therapy Treatment and Discharge  Patient Details  Name: [name] MRN: [id number] Date of Birth: 1956/02/12 Referring Provider (PT): Dr Everrett Coombe  PHYSICAL THERAPY DISCHARGE SUMMARY  Visits from Start of Care: 2  Current functional level related to goals / functional outcomes: Has not returned since last visit   Remaining deficits: See below   Education / Equipment: Reviewed HEP given by Dr. Karie Schwalbe and demonstrated correct positioning. Discussed/demonstrated other exercises that may be helpful and progressions.    Patient agrees to discharge. Patient goals were not met. Patient is being discharged due to not returning since the last visit.   Encounter Date: 10/11/2021   PT End of Session - 10/11/21 1148     Visit Number 2    Number of Visits 12    Date for PT Re-Evaluation 11/08/21    Authorization Type [employer] Medicare    Progress Note Due on Visit 10    PT Start Time 1100    PT Stop Time 1145    PT Time Calculation (min) 45 min    Activity Tolerance Patient tolerated treatment well    Behavior During Therapy [facility] for tasks assessed/performed             Past Medical History:  Diagnosis Date   Anxiety    Asthma    Breast cancer (HCC)    Chest pain, atypical    Closed head injury 06/06/2017   Depression    Heart palpitations    hx of MVP   Hypertension    IBS (irritable bowel syndrome)    Pre-diabetes     Past Surgical History:  Procedure Laterality Date   BREAST SURGERY  06/2018   right and left breast   eAB     x2    MASTECTOMY MODIFIED RADICAL Bilateral 10/10/2018   Procedure: RIGHT MODIFIED RADICAL MASTECTOMY AND LEFT PROPHYLATIC MASTECTOMY;  Surgeon: Griselda Miner, MD;  Location: [facility];  Service: General;  Laterality: Bilateral;   PORTACATH PLACEMENT Left 08/12/2018   Procedure:  INSERTION PORT-A-CATH;  Surgeon: Griselda Miner, MD;  Location: [facility];  Service: General;  Laterality: Left;    There were no vitals filed for this visit.   Subjective Assessment - 10/11/21 1109     Subjective Pt states she's been doing stretches and exercises from Dr. Karie Schwalbe. Pt has been trying to walk more -- only able to walk 10 min twice a week. Pt states she has been able to walk now but she wasn't able to walk on last eval. Pt states that the N/T in the right leg has improved but still has issues.    Pertinent History Breast Cancer 2019; cancer under control; HTN; foot problems Lt cyst like on plantar surface    Patient Stated Goals decreas pain and be able to walk better    Pain Onset More than a month ago                [facility] PT Assessment - 10/11/21 0001       Assessment   Medical Diagnosis Rt sciatica; Rt LB; Rt side    Referring Provider (PT) Dr Everrett Coombe    Onset Date/Surgical Date 09/08/21    Hand Dominance Right    Next MD Visit PRN    Prior Therapy none  [facility] Adult PT Treatment/Exercise - 10/11/21 0001       Lumbar Exercises: Stretches   Prone on Elbows Stretch 5 reps;10 seconds    Prone Mid Back Stretch 1 rep;60 seconds    Piriformis Stretch Right;30 seconds    Piriformis Stretch Limitations seated    Figure 4 Stretch 1 rep;30 seconds;Seated      Lumbar Exercises: Supine   Pelvic Tilt 10 reps;5 seconds    Pelvic Tilt Limitations with ab set      Lumbar Exercises: Quadruped   Other Quadruped Lumbar Exercises cat/cow 2x10x5 sec hold      Knee/Hip Exercises: Stretches   Passive Hamstring Stretch Right;60 seconds;Left    Passive Hamstring Stretch Limitations seated    Other Knee/Hip Stretches Sciatic nerve glides seated x10    Other Knee/Hip Stretches R lateral hip shift into wall 2x20      Knee/Hip Exercises: Aerobic   Nustep L5 x 5 min      Knee/Hip Exercises: Prone   Hip Extension  Strengthening;Right;Left;10 reps    Hip Extension Limitations 5 sec hold                          PT Long Term Goals - 09/27/21 1249       PT LONG TERM GOAL #1   Title Further assessment of defecits and limitations as indicated and patient tolerates to progress with rehab plan    Time 6    Period Weeks    Status New    Target Date 11/08/21      PT LONG TERM GOAL #2   Title Decrease pain in the Rt side including pain in the Rt shoulder, flank, posterior hip and LE by 50-60% allowing patient to increase functional activity level    Time 6    Period Weeks    Status New    Target Date 11/08/21      PT LONG TERM GOAL #3   Title Patient to tolerant 20-30 minutes of exercise/functional activities with minimal to no increase in pain level    Time 6    Period Weeks    Status New    Target Date 11/08/21      PT LONG TERM GOAL #4   Title Independent in HEP (including aquatic program as indicated)    Time 6    Period Weeks    Status New    Target Date 11/08/21      PT LONG TERM GOAL #5   Title Improve gait pattern with patient to demonstrate more normal gait pattern with equal weight bearing bilat LE's in stance phase of gait    Time 6    Period Weeks    Status New    Target Date 11/08/21                   Plan - 10/11/21 1147     Clinical Impression Statement Pt with improving hip flexibility. Reviewed pt's HEP from Dr. Karie Schwalbe and modified as needed. Provided new stretches and exercises for pt to trial at home. Able to tolerate prone this session.    Stability/Clinical Decision Making Stable/Uncomplicated    Rehab Potential Fair    PT Frequency 2x / week    PT Duration 6 weeks    PT Treatment/Interventions ADLs/Self Care Home Management;Aquatic Therapy;Cryotherapy;Iontophoresis 4mg /ml Dexamethasone;Moist Heat;Gait training;Stair training;Functional mobility training;Therapeutic activities;Therapeutic exercise;Balance training;Neuromuscular  re-education;Patient/family education;Manual techniques;Dry needling;Taping    PT Next Visit Plan further assessment  as pt tolerates; review and progress with exercise as patient tolerates; manual work as tolerated; normalization of movement; heat/cold as indicated - not a canditate for modalities due to history of cancer    PT Home Exercise Plan [email or web address]    Consulted and Agree with Plan of Care Patient             Patient will benefit from skilled therapeutic intervention in order to improve the following deficits and impairments:  Abnormal gait, Decreased range of motion, Decreased activity tolerance, Pain, Impaired flexibility, Improper body mechanics, Decreased mobility, Postural dysfunction  Visit Diagnosis: Sciatica, right side  Other symptoms and signs involving the musculoskeletal system  Other abnormalities of gait and mobility     Problem List Patient Active Problem List   Diagnosis Date Noted   Left lumbar radiculitis 09/28/2021   Chemotherapy induced neutropenia (HCC) 04/14/2021   Cellulitis 11/07/2020   Stage IV breast cancer in female (HCC) 10/17/2020   Liver metastases (HCC) 10/11/2020   Bone metastases (HCC) 10/10/2020   Elevated LFTs 09/06/2020   Fatigue 09/06/2020   Positive colorectal cancer screening using Cologuard test 09/05/2020   Head injury 04/22/2020   T2DM (type 2 diabetes mellitus) (HCC) 04/19/2020   Redness and swelling of upper arm 03/18/2020   Dizziness 01/01/2020   Chest discomfort 07/30/2019   Increased risk of breast cancer 12/13/2018   Moderate episode of recurrent major depressive disorder (HCC) 08/21/2018   Malignant neoplasm of upper-outer quadrant of right breast in female, estrogen receptor positive (HCC) 07/25/2018   Dysuria 04/30/2018   Positive ANA (antinuclear antibody) 04/15/2018   Elevated CK 04/15/2018   Vitamin D deficiency 04/15/2018   Acute stress reaction 04/15/2018   Abnormal fasting glucose 10/11/2017   Seborrheic  dermatitis 07/19/2017   Allergic conjunctivitis 07/19/2017   Gastritis 06/27/2017   BMI 30.0-30.9,adult 05/03/2017   Palpitation 08/29/2016   Insomnia 09/28/2015   Asthma, persistent controlled 02/26/2014   Eczematous dermatitis 02/26/2014   Prediabetes 02/26/2014   Hyperlipidemia 12/31/2013   Essential hypertension 08/23/2012   GAD (generalized anxiety disorder) 05/01/2012    [facility] April Dell Ponto, PT, DPT 10/11/2021, 11:49 AM  [facility] [address] [place], [place], [address] Phone: [phone]   Fax:  [phone]  Name: TANISH MASCHINO MRN: [id number] Date of Birth: [birth year]

## 2021-10-12 MED ORDER — BUDESONIDE-FORMOTEROL FUMARATE 160-4.5 MCG/ACT IN AERO: 2.0000 | INHALATION_SPRAY | Freq: Two times a day (BID) | RESPIRATORY_TRACT | 12 refills | Status: DC

## 2021-10-13 ENCOUNTER — Encounter: Payer: Self-pay | Admitting: Sports Medicine

## 2021-10-13 ENCOUNTER — Encounter: Payer: Self-pay | Admitting: Family Medicine

## 2021-10-13 NOTE — Telephone Encounter (Signed)
Just an FYI as this may get messy. ?

## 2021-10-14 ENCOUNTER — Other Ambulatory Visit: Payer: Self-pay | Admitting: Family Medicine

## 2021-10-14 MED ORDER — ALBUTEROL SULFATE HFA 108 (90 BASE) MCG/ACT IN AERS: 2.0000 | INHALATION_SPRAY | Freq: Four times a day (QID) | RESPIRATORY_TRACT | 0 refills | Status: DC | PRN

## 2021-10-14 MED ORDER — BUDESONIDE-FORMOTEROL FUMARATE 160-4.5 MCG/ACT IN AERO: 2.0000 | INHALATION_SPRAY | Freq: Two times a day (BID) | RESPIRATORY_TRACT | 12 refills | Status: DC

## 2021-10-14 NOTE — Telephone Encounter (Signed)
Rx approved

## 2021-10-17 DIAGNOSIS — K769 Liver disease, unspecified: Secondary | ICD-10-CM | POA: Diagnosis not present

## 2021-10-17 DIAGNOSIS — R918 Other nonspecific abnormal finding of lung field: Secondary | ICD-10-CM | POA: Diagnosis not present

## 2021-10-17 DIAGNOSIS — C50811 Malignant neoplasm of overlapping sites of right female breast: Secondary | ICD-10-CM | POA: Diagnosis not present

## 2021-10-17 DIAGNOSIS — C7951 Secondary malignant neoplasm of bone: Secondary | ICD-10-CM | POA: Diagnosis not present

## 2021-10-17 DIAGNOSIS — Z170 Estrogen receptor positive status [ER+]: Secondary | ICD-10-CM | POA: Diagnosis not present

## 2021-10-17 DIAGNOSIS — C787 Secondary malignant neoplasm of liver and intrahepatic bile duct: Secondary | ICD-10-CM | POA: Diagnosis not present

## 2021-10-19 DIAGNOSIS — I10 Essential (primary) hypertension: Secondary | ICD-10-CM | POA: Diagnosis not present

## 2021-10-19 DIAGNOSIS — C50811 Malignant neoplasm of overlapping sites of right female breast: Secondary | ICD-10-CM | POA: Diagnosis not present

## 2021-10-19 DIAGNOSIS — C787 Secondary malignant neoplasm of liver and intrahepatic bile duct: Secondary | ICD-10-CM | POA: Diagnosis not present

## 2021-10-19 DIAGNOSIS — Z170 Estrogen receptor positive status [ER+]: Secondary | ICD-10-CM | POA: Diagnosis not present

## 2021-10-19 DIAGNOSIS — C7951 Secondary malignant neoplasm of bone: Secondary | ICD-10-CM | POA: Diagnosis not present

## 2021-10-20 DIAGNOSIS — C787 Secondary malignant neoplasm of liver and intrahepatic bile duct: Secondary | ICD-10-CM | POA: Diagnosis not present

## 2021-10-20 DIAGNOSIS — C50811 Malignant neoplasm of overlapping sites of right female breast: Secondary | ICD-10-CM | POA: Diagnosis not present

## 2021-10-20 DIAGNOSIS — Z170 Estrogen receptor positive status [ER+]: Secondary | ICD-10-CM | POA: Diagnosis not present

## 2021-10-20 DIAGNOSIS — C7951 Secondary malignant neoplasm of bone: Secondary | ICD-10-CM | POA: Diagnosis not present

## 2021-10-20 DIAGNOSIS — I427 Cardiomyopathy due to drug and external agent: Secondary | ICD-10-CM | POA: Diagnosis not present

## 2021-10-23 ENCOUNTER — Encounter: Payer: Self-pay | Admitting: Sports Medicine

## 2021-10-27 DIAGNOSIS — C787 Secondary malignant neoplasm of liver and intrahepatic bile duct: Secondary | ICD-10-CM | POA: Diagnosis not present

## 2021-10-27 DIAGNOSIS — Z170 Estrogen receptor positive status [ER+]: Secondary | ICD-10-CM | POA: Diagnosis not present

## 2021-10-27 DIAGNOSIS — C50811 Malignant neoplasm of overlapping sites of right female breast: Secondary | ICD-10-CM | POA: Diagnosis not present

## 2021-10-27 DIAGNOSIS — C7951 Secondary malignant neoplasm of bone: Secondary | ICD-10-CM | POA: Diagnosis not present

## 2021-10-29 ENCOUNTER — Other Ambulatory Visit: Payer: Self-pay | Admitting: Family Medicine

## 2021-11-01 DIAGNOSIS — Z170 Estrogen receptor positive status [ER+]: Secondary | ICD-10-CM | POA: Diagnosis not present

## 2021-11-01 DIAGNOSIS — I427 Cardiomyopathy due to drug and external agent: Secondary | ICD-10-CM | POA: Diagnosis not present

## 2021-11-01 DIAGNOSIS — I517 Cardiomegaly: Secondary | ICD-10-CM | POA: Diagnosis not present

## 2021-11-01 DIAGNOSIS — C50811 Malignant neoplasm of overlapping sites of right female breast: Secondary | ICD-10-CM | POA: Diagnosis not present

## 2021-11-02 DIAGNOSIS — Z170 Estrogen receptor positive status [ER+]: Secondary | ICD-10-CM | POA: Diagnosis not present

## 2021-11-02 DIAGNOSIS — C50811 Malignant neoplasm of overlapping sites of right female breast: Secondary | ICD-10-CM | POA: Diagnosis not present

## 2021-11-02 DIAGNOSIS — I10 Essential (primary) hypertension: Secondary | ICD-10-CM | POA: Diagnosis not present

## 2021-11-03 DIAGNOSIS — Z170 Estrogen receptor positive status [ER+]: Secondary | ICD-10-CM | POA: Diagnosis not present

## 2021-11-03 DIAGNOSIS — C50811 Malignant neoplasm of overlapping sites of right female breast: Secondary | ICD-10-CM | POA: Diagnosis not present

## 2021-11-08 ENCOUNTER — Encounter: Payer: Self-pay | Admitting: Family Medicine

## 2021-11-11 DIAGNOSIS — C50811 Malignant neoplasm of overlapping sites of right female breast: Secondary | ICD-10-CM | POA: Diagnosis not present

## 2021-11-11 DIAGNOSIS — Z170 Estrogen receptor positive status [ER+]: Secondary | ICD-10-CM | POA: Diagnosis not present

## 2021-11-15 ENCOUNTER — Encounter: Payer: Medicare Other | Admitting: Family Medicine

## 2021-11-16 DIAGNOSIS — Z170 Estrogen receptor positive status [ER+]: Secondary | ICD-10-CM | POA: Diagnosis not present

## 2021-11-16 DIAGNOSIS — C50811 Malignant neoplasm of overlapping sites of right female breast: Secondary | ICD-10-CM | POA: Diagnosis not present

## 2021-11-16 DIAGNOSIS — C787 Secondary malignant neoplasm of liver and intrahepatic bile duct: Secondary | ICD-10-CM | POA: Diagnosis not present

## 2021-11-16 DIAGNOSIS — C7951 Secondary malignant neoplasm of bone: Secondary | ICD-10-CM | POA: Diagnosis not present

## 2021-11-17 DIAGNOSIS — C7951 Secondary malignant neoplasm of bone: Secondary | ICD-10-CM | POA: Diagnosis not present

## 2021-11-17 DIAGNOSIS — I10 Essential (primary) hypertension: Secondary | ICD-10-CM | POA: Diagnosis not present

## 2021-11-17 DIAGNOSIS — Z5112 Encounter for antineoplastic immunotherapy: Secondary | ICD-10-CM | POA: Diagnosis not present

## 2021-11-17 DIAGNOSIS — Z170 Estrogen receptor positive status [ER+]: Secondary | ICD-10-CM | POA: Diagnosis not present

## 2021-11-17 DIAGNOSIS — L308 Other specified dermatitis: Secondary | ICD-10-CM | POA: Diagnosis not present

## 2021-11-17 DIAGNOSIS — C787 Secondary malignant neoplasm of liver and intrahepatic bile duct: Secondary | ICD-10-CM | POA: Diagnosis not present

## 2021-11-17 DIAGNOSIS — C50811 Malignant neoplasm of overlapping sites of right female breast: Secondary | ICD-10-CM | POA: Diagnosis not present

## 2021-11-21 ENCOUNTER — Other Ambulatory Visit: Payer: Self-pay | Admitting: Family Medicine

## 2021-11-21 ENCOUNTER — Encounter: Payer: Self-pay | Admitting: Family Medicine

## 2021-11-22 ENCOUNTER — Other Ambulatory Visit: Payer: Self-pay

## 2021-11-22 MED ORDER — CITALOPRAM HYDROBROMIDE 10 MG PO TABS: 10.0000 mg | ORAL_TABLET | Freq: Every day | ORAL | 0 refills | Status: DC

## 2021-11-23 DIAGNOSIS — Z170 Estrogen receptor positive status [ER+]: Secondary | ICD-10-CM | POA: Diagnosis not present

## 2021-11-23 DIAGNOSIS — C50811 Malignant neoplasm of overlapping sites of right female breast: Secondary | ICD-10-CM | POA: Diagnosis not present

## 2021-11-24 ENCOUNTER — Encounter: Payer: Medicare Other | Admitting: Family Medicine

## 2021-11-24 DIAGNOSIS — Z170 Estrogen receptor positive status [ER+]: Secondary | ICD-10-CM | POA: Diagnosis not present

## 2021-11-24 DIAGNOSIS — C50811 Malignant neoplasm of overlapping sites of right female breast: Secondary | ICD-10-CM | POA: Diagnosis not present

## 2021-11-28 DIAGNOSIS — C7951 Secondary malignant neoplasm of bone: Secondary | ICD-10-CM | POA: Diagnosis not present

## 2021-12-02 ENCOUNTER — Encounter: Payer: Self-pay | Admitting: Cardiovascular Disease

## 2021-12-02 ENCOUNTER — Ambulatory Visit (INDEPENDENT_AMBULATORY_CARE_PROVIDER_SITE_OTHER): Payer: Medicare Other | Admitting: Family Medicine

## 2021-12-02 ENCOUNTER — Encounter: Payer: Self-pay | Admitting: Family Medicine

## 2021-12-02 VITALS — BP 146/79 | HR 72 | Wt 167.0 lb

## 2021-12-02 DIAGNOSIS — C50411 Malignant neoplasm of upper-outer quadrant of right female breast: Secondary | ICD-10-CM

## 2021-12-02 DIAGNOSIS — I10 Essential (primary) hypertension: Secondary | ICD-10-CM

## 2021-12-02 DIAGNOSIS — E1169 Type 2 diabetes mellitus with other specified complication: Secondary | ICD-10-CM

## 2021-12-02 DIAGNOSIS — Z170 Estrogen receptor positive status [ER+]: Secondary | ICD-10-CM

## 2021-12-02 DIAGNOSIS — Z0000 Encounter for general adult medical examination without abnormal findings: Secondary | ICD-10-CM

## 2021-12-02 MED ORDER — MUPIROCIN 2 % EX OINT: 1.0000 "application " | TOPICAL_OINTMENT | Freq: Two times a day (BID) | CUTANEOUS | 0 refills | Status: DC

## 2021-12-02 NOTE — Progress Notes (Signed)
?[name] - 66 y.o. female MRN [id number]  Date of birth: Dec 19, 1955 ? ?Subjective ?Chief Complaint  ?Patient presents with  ? Annual Exam  ? ? ?HPI ?[name] is a 67 y.o. female here today for annual exam.  Doing pretty well at this time.  She remains very optimistic and faithful in her fight against cancer.  She has another scan next month to assess how her treatment is going.  She does have some GI side effects from her current treatment.   ? ?She tries to stay active and is walking for exercise.  She has been trying to follow a pretty healthy diet.   ? ?She is a non-smoker.  She denies EtOH use at this time.   ? ?She did have a positive cologuard previously.  Has not had follow up colonoscopy yet.  She does plan to have this completed.  ? ?She has a sore area around the piercing in her R ear.   ? ?Review of Systems  ?Constitutional:  Negative for chills, fever, malaise/fatigue and weight loss.  ?HENT:  Negative for congestion, ear pain and sore throat.   ?Eyes:  Negative for blurred vision, double vision and pain.  ?Respiratory:  Negative for cough and shortness of breath.   ?Cardiovascular:  Negative for chest pain and palpitations.  ?Gastrointestinal:  Negative for abdominal pain, blood in stool, constipation, heartburn and nausea.  ?Genitourinary:  Negative for dysuria and urgency.  ?Musculoskeletal:  Negative for joint pain and myalgias.  ?Neurological:  Negative for dizziness and headaches.  ?Endo/Heme/Allergies:  Does not bruise/bleed easily.  ?Psychiatric/Behavioral:  Negative for depression. The patient is not nervous/anxious and does not have insomnia.   ? ?Allergies  ?Allergen Reactions  ? Penicillins Shortness Of Breath  ?  Reaction as a child ?Did it involve swelling of the face/tongue/throat, SOB, or low BP? Yes ?Did it involve sudden or severe rash/hives, skin peeling, or any reaction on the inside of your mouth or nose?Unknown ?Did you need to seek medical attention at a hospital or  doctor's office? Yes ?When did it last happen? Childhood reaction.    ?If all above answers are ?NO?, may proceed with cephalosporin use. ?  ? Sulfa Antibiotics Nausea Only  ? Effexor [Venlafaxine] Other (See Comments)  ?  "dopey"  ? ? ?Past Medical History:  ?Diagnosis Date  ? Anxiety   ? Asthma   ? Breast cancer ([place])   ? Chest pain, atypical   ? Closed head injury 06/06/2017  ? Depression   ? Heart palpitations   ? hx of MVP  ? Hypertension   ? IBS (irritable bowel syndrome)   ? Pre-diabetes   ? ? ?Past Surgical History:  ?Procedure Laterality Date  ? BREAST SURGERY  06/2018  ? right and left breast  ? eAB    ? x2   ? MASTECTOMY MODIFIED RADICAL Bilateral 10/10/2018  ? Procedure: RIGHT MODIFIED RADICAL MASTECTOMY AND LEFT PROPHYLATIC MASTECTOMY;  Surgeon: [name], MD;  Location: [facility];  Service: General;  Laterality: Bilateral;  ? PORTACATH PLACEMENT Left 08/12/2018  ? Procedure: INSERTION PORT-A-CATH;  Surgeon: [name], MD;  Location: [facility];  Service: General;  Laterality: Left;  ? ? ?Social History  ? ?Socioeconomic History  ? Marital status: Divorced  ?  Spouse name: Not on file  ? Number of children: Not on file  ? Years of education: Not on file  ? Highest education level: Not on file  ?  Occupational History  ? Not on file  ?Tobacco Use  ? Smoking status: Never  ? Smokeless tobacco: Never  ? Tobacco comments:  ?  nonsmoker  ?Vaping Use  ? Vaping Use: Never used  ?Substance and Sexual Activity  ? Alcohol use: No  ? Drug use: No  ? Sexual activity: Not Currently  ?  Birth control/protection: None  ?Other Topics Concern  ? Not on file  ?Social History Narrative  ? Divorced; grown son lives in [place].   ? [facility]- PCP  ? ?Social Determinants of Health  ? ?Financial Resource Strain: Not on file  ?Food Insecurity: Not on file  ?Transportation Needs: Not on file  ?Physical Activity: Not on file  ?Stress: Not on file  ?Social Connections: Not on file  ? ? ?Family History   ?Problem Relation Age of Onset  ? Heart attack Father   ? Lung cancer Mother   ? Bladder Cancer Brother   ? ? ?Health Maintenance  ?Topic Date Due  ? URINE MICROALBUMIN  04/24/2015  ? OPHTHALMOLOGY EXAM  11/03/2020  ? HEMOGLOBIN A1C  10/16/2021  ? Zoster Vaccines- Shingrix (1 of 2) 01/19/2022 (Originally 11/22/1973)  ? Pneumonia Vaccine 5+ Years old (1 - PCV) 10/20/2022 (Originally 11/22/1960)  ? DEXA SCAN  10/20/2022 (Originally 11/23/2019)  ? COLONOSCOPY (Pts 45-34yr Insurance coverage will need to be confirmed)  10/20/2022 (Originally 11/23/1999)  ? INFLUENZA VACCINE  02/28/2022  ? FOOT EXAM  04/18/2022  ? TETANUS/TDAP  03/28/2028  ? Hepatitis C Screening  Completed  ? HPV VACCINES  Aged Out  ? MAMMOGRAM  Discontinued  ? COVID-19 Vaccine  Discontinued  ? ? ? ?----------------------------------------------------------------------------------------------------------------------------------------------------------------------------------------------------------------- ?Physical Exam ?BP (!) 146/79   Pulse 72   Wt 167 lb (75.8 kg)   SpO2 97%   BMI 28.67 kg/m?  ? ?Physical Exam ?Constitutional:   ?   General: She is not in acute distress. ?HENT:  ?   Head: Normocephalic and atraumatic.  ?   Right Ear: Tympanic membrane and ear canal normal.  ?   Left Ear: Tympanic membrane and ear canal normal.  ?   Nose: Nose normal.  ?Eyes:  ?   General: No scleral icterus. ?   Conjunctiva/sclera: Conjunctivae normal.  ?Neck:  ?   Thyroid: No thyromegaly.  ?Cardiovascular:  ?   Rate and Rhythm: Normal rate and regular rhythm.  ?   Heart sounds: Normal heart sounds.  ?Pulmonary:  ?   Effort: Pulmonary effort is normal.  ?   Breath sounds: Normal breath sounds.  ?Abdominal:  ?   General: Bowel sounds are normal. There is no distension.  ?   Palpations: Abdomen is soft.  ?   Tenderness: There is no abdominal tenderness. There is no guarding.  ?Musculoskeletal:     ?   General: Normal range of motion.  ?   Cervical back: Normal range  of motion and neck supple.  ?Lymphadenopathy:  ?   Cervical: No cervical adenopathy.  ?Skin: ?   General: Skin is warm and dry.  ?   Findings: No rash.  ?Neurological:  ?   General: No focal deficit present.  ?   Mental Status: She is alert and oriented to person, place, and time.  ?   Cranial Nerves: No cranial nerve deficit.  ?   Coordination: Coordination normal.  ?Psychiatric:     ?   Mood and Affect: Mood normal.     ?   Behavior: Behavior normal.  ? ? ?------------------------------------------------------------------------------------------------------------------------------------------------------------------------------------------------------------------- ?  Assessment and Plan ? ?Essential hypertension ?BP is a little elevated today.  Readings at home have been better.  She does not want to add medication for HTN back on at this time.   ? ?T2DM (type 2 diabetes mellitus) ([place]) ?Updated a1c ordered today.  ? ?Malignant neoplasm of upper-outer quadrant of right breast in female, estrogen receptor positive ([place]) ?Managed by oncology.  Stable at this time.   ? ?Well adult exam ?Well adult ?Orders Placed This Encounter  ?Procedures  ? Lipid Panel w/reflex Direct LDL  ? HgB A1c  ?Screenings:  Due to have colonoscopy as she had positive cologuard.  She will plan to schedule this.  ?Immunizations:  Declines any immunizations at this time. ?Anticipatory guidance/RIsk factor reduction:  Recommendations per AVS.   ? ? ?Meds ordered this encounter  ?Medications  ? mupirocin ointment (BACTROBAN) 2 %  ?  Sig: Apply 1 application. topically 2 (two) times daily.  ?  Dispense:  22 g  ?  Refill:  0  ? ? ?Return in about 6 months (around 06/04/2022) for T2DM/HTN. ? ? ? ?This visit occurred during the SARS-CoV-2 public health emergency.  Safety protocols were in place, including screening questions prior to the visit, additional usage of staff PPE, and extensive cleaning of exam room while observing appropriate contact time as  indicated for disinfecting solutions.  ? ?

## 2021-12-02 NOTE — Patient Instructions (Signed)
Preventive Care 7 Years and Older, Female ?Preventive care refers to lifestyle choices and visits with your health care provider that can promote health and wellness. Preventive care visits are also called wellness exams. ?What can I expect for my preventive care visit? ?Counseling ?Your health care provider may ask you questions about your: ?Medical history, including: ?Past medical problems. ?Family medical history. ?Pregnancy and menstrual history. ?History of falls. ?Current health, including: ?Memory and ability to understand (cognition). ?Emotional well-being. ?Home life and relationship well-being. ?Sexual activity and sexual health. ?Lifestyle, including: ?Alcohol, nicotine or tobacco, and drug use. ?Access to firearms. ?Diet, exercise, and sleep habits. ?Work and work Statistician. ?Sunscreen use. ?Safety issues such as seatbelt and bike helmet use. ?Physical exam ?Your health care provider will check your: ?Height and weight. These may be used to calculate your BMI (body mass index). BMI is a measurement that tells if you are at a healthy weight. ?Waist circumference. This measures the distance around your waistline. This measurement also tells if you are at a healthy weight and may help predict your risk of certain diseases, such as type 2 diabetes and high blood pressure. ?Heart rate and blood pressure. ?Body temperature. ?Skin for abnormal spots. ?What immunizations do I need? ? ?Vaccines are usually given at various ages, according to a schedule. Your health care provider will recommend vaccines for you based on your age, medical history, and lifestyle or other factors, such as travel or where you work. ?What tests do I need? ?Screening ?Your health care provider may recommend screening tests for certain conditions. This may include: ?Lipid and cholesterol levels. ?Hepatitis C test. ?Hepatitis B test. ?HIV (human immunodeficiency virus) test. ?STI (sexually transmitted infection) testing, if you are at  risk. ?Lung cancer screening. ?Colorectal cancer screening. ?Diabetes screening. This is done by checking your blood sugar (glucose) after you have not eaten for a while (fasting). ?Mammogram. Talk with your health care provider about how often you should have regular mammograms. ?BRCA-related cancer screening. This may be done if you have a family history of breast, ovarian, tubal, or peritoneal cancers. ?Bone density scan. This is done to screen for osteoporosis. ?Talk with your health care provider about your test results, treatment options, and if necessary, the need for more tests. ?Follow these instructions at home: ?Eating and drinking ? ?Eat a diet that includes fresh fruits and vegetables, whole grains, lean protein, and low-fat dairy products. Limit your intake of foods with high amounts of sugar, saturated fats, and salt. ?Take vitamin and mineral supplements as recommended by your health care provider. ?Do not drink alcohol if your health care provider tells you not to drink. ?If you drink alcohol: ?Limit how much you have to 0-1 drink a day. ?Know how much alcohol is in your drink. In the [place] one drink equals one 12 oz bottle of beer (355 mL), one 5 oz glass of wine (148 mL), or one 1? oz glass of hard liquor (44 mL). ?Lifestyle ?Brush your teeth every morning and night with fluoride toothpaste. Floss one time each day. ?Exercise for at least 30 minutes 5 or more days each week. ?Do not use any products that contain nicotine or tobacco. These products include cigarettes, chewing tobacco, and vaping devices, such as e-cigarettes. If you need help quitting, ask your health care provider. ?Do not use drugs. ?If you are sexually active, practice safe sex. Use a condom or other form of protection in order to prevent STIs. ?Take aspirin only as told by  your health care provider. Make sure that you understand how much to take and what form to take. Work with your health care provider to find out whether it  is safe and beneficial for you to take aspirin daily. ?Ask your health care provider if you need to take a cholesterol-lowering medicine (statin). ?Find healthy ways to manage stress, such as: ?Meditation, yoga, or listening to music. ?Journaling. ?Talking to a trusted person. ?Spending time with friends and family. ?Minimize exposure to UV radiation to reduce your risk of skin cancer. ?Safety ?Always wear your seat belt while driving or riding in a vehicle. ?Do not drive: ?If you have been drinking alcohol. Do not ride with someone who has been drinking. ?When you are tired or distracted. ?While texting. ?If you have been using any mind-altering substances or drugs. ?Wear a helmet and other protective equipment during sports activities. ?If you have firearms in your house, make sure you follow all gun safety procedures. ?What's next? ?Visit your health care provider once a year for an annual wellness visit. ?Ask your health care provider how often you should have your eyes and teeth checked. ?Stay up to date on all vaccines. ?This information is not intended to replace advice given to you by your health care provider. Make sure you discuss any questions you have with your health care provider. ?Document Revised: 01/12/2021 Document Reviewed: 01/12/2021 ?Elsevier Patient Education ? [place]. ? ?

## 2021-12-04 ENCOUNTER — Encounter: Payer: Self-pay | Admitting: Family Medicine

## 2021-12-04 DIAGNOSIS — Z0000 Encounter for general adult medical examination without abnormal findings: Secondary | ICD-10-CM | POA: Insufficient documentation

## 2021-12-04 NOTE — Assessment & Plan Note (Signed)
Updated a1c ordered today.  ?

## 2021-12-04 NOTE — Assessment & Plan Note (Signed)
Well adult ?Orders Placed This Encounter  ?Procedures  ?? Lipid Panel w/reflex Direct LDL  ?? HgB A1c  ?Screenings:  Due to have colonoscopy as she had positive cologuard.  She will plan to schedule this.  ?Immunizations:  Declines any immunizations at this time. ?Anticipatory guidance/RIsk factor reduction:  Recommendations per AVS.   ? ?

## 2021-12-04 NOTE — Assessment & Plan Note (Signed)
BP is a little elevated today.  Readings at home have been better.  She does not want to add medication for HTN back on at this time.   ?

## 2021-12-04 NOTE — Assessment & Plan Note (Signed)
Managed by oncology.  Stable at this time.   ?

## 2021-12-07 ENCOUNTER — Encounter: Payer: Self-pay | Admitting: Family Medicine

## 2021-12-07 ENCOUNTER — Encounter: Payer: Self-pay | Admitting: Cardiovascular Disease

## 2021-12-07 DIAGNOSIS — C7951 Secondary malignant neoplasm of bone: Secondary | ICD-10-CM | POA: Diagnosis not present

## 2021-12-07 DIAGNOSIS — Z170 Estrogen receptor positive status [ER+]: Secondary | ICD-10-CM | POA: Diagnosis not present

## 2021-12-07 DIAGNOSIS — R7989 Other specified abnormal findings of blood chemistry: Secondary | ICD-10-CM | POA: Diagnosis not present

## 2021-12-07 DIAGNOSIS — C787 Secondary malignant neoplasm of liver and intrahepatic bile duct: Secondary | ICD-10-CM | POA: Diagnosis not present

## 2021-12-07 DIAGNOSIS — C50811 Malignant neoplasm of overlapping sites of right female breast: Secondary | ICD-10-CM | POA: Diagnosis not present

## 2021-12-07 DIAGNOSIS — L989 Disorder of the skin and subcutaneous tissue, unspecified: Secondary | ICD-10-CM | POA: Diagnosis not present

## 2021-12-07 DIAGNOSIS — I10 Essential (primary) hypertension: Secondary | ICD-10-CM | POA: Diagnosis not present

## 2021-12-08 DIAGNOSIS — Z170 Estrogen receptor positive status [ER+]: Secondary | ICD-10-CM | POA: Diagnosis not present

## 2021-12-08 DIAGNOSIS — Z5111 Encounter for antineoplastic chemotherapy: Secondary | ICD-10-CM | POA: Diagnosis not present

## 2021-12-08 DIAGNOSIS — C787 Secondary malignant neoplasm of liver and intrahepatic bile duct: Secondary | ICD-10-CM | POA: Diagnosis not present

## 2021-12-08 DIAGNOSIS — C50811 Malignant neoplasm of overlapping sites of right female breast: Secondary | ICD-10-CM | POA: Diagnosis not present

## 2021-12-08 DIAGNOSIS — C7951 Secondary malignant neoplasm of bone: Secondary | ICD-10-CM | POA: Diagnosis not present

## 2021-12-14 DIAGNOSIS — C50811 Malignant neoplasm of overlapping sites of right female breast: Secondary | ICD-10-CM | POA: Diagnosis not present

## 2021-12-14 DIAGNOSIS — Z170 Estrogen receptor positive status [ER+]: Secondary | ICD-10-CM | POA: Diagnosis not present

## 2021-12-15 DIAGNOSIS — I10 Essential (primary) hypertension: Secondary | ICD-10-CM | POA: Diagnosis not present

## 2021-12-15 DIAGNOSIS — K625 Hemorrhage of anus and rectum: Secondary | ICD-10-CM | POA: Diagnosis not present

## 2021-12-15 DIAGNOSIS — R195 Other fecal abnormalities: Secondary | ICD-10-CM | POA: Diagnosis not present

## 2021-12-15 DIAGNOSIS — C50811 Malignant neoplasm of overlapping sites of right female breast: Secondary | ICD-10-CM | POA: Diagnosis not present

## 2021-12-15 DIAGNOSIS — Z170 Estrogen receptor positive status [ER+]: Secondary | ICD-10-CM | POA: Diagnosis not present

## 2021-12-15 DIAGNOSIS — C7951 Secondary malignant neoplasm of bone: Secondary | ICD-10-CM | POA: Diagnosis not present

## 2021-12-19 DIAGNOSIS — Z170 Estrogen receptor positive status [ER+]: Secondary | ICD-10-CM | POA: Diagnosis not present

## 2021-12-19 DIAGNOSIS — C50811 Malignant neoplasm of overlapping sites of right female breast: Secondary | ICD-10-CM | POA: Diagnosis not present

## 2021-12-20 ENCOUNTER — Encounter: Payer: Self-pay | Admitting: Family Medicine

## 2021-12-28 DIAGNOSIS — Z170 Estrogen receptor positive status [ER+]: Secondary | ICD-10-CM | POA: Diagnosis not present

## 2021-12-28 DIAGNOSIS — C7951 Secondary malignant neoplasm of bone: Secondary | ICD-10-CM | POA: Diagnosis not present

## 2021-12-28 DIAGNOSIS — C787 Secondary malignant neoplasm of liver and intrahepatic bile duct: Secondary | ICD-10-CM | POA: Diagnosis not present

## 2021-12-28 DIAGNOSIS — G44219 Episodic tension-type headache, not intractable: Secondary | ICD-10-CM | POA: Diagnosis not present

## 2021-12-28 DIAGNOSIS — C50811 Malignant neoplasm of overlapping sites of right female breast: Secondary | ICD-10-CM | POA: Diagnosis not present

## 2021-12-29 DIAGNOSIS — C787 Secondary malignant neoplasm of liver and intrahepatic bile duct: Secondary | ICD-10-CM | POA: Diagnosis not present

## 2021-12-29 DIAGNOSIS — C7951 Secondary malignant neoplasm of bone: Secondary | ICD-10-CM | POA: Diagnosis not present

## 2021-12-29 DIAGNOSIS — Z170 Estrogen receptor positive status [ER+]: Secondary | ICD-10-CM | POA: Diagnosis not present

## 2021-12-29 DIAGNOSIS — C50811 Malignant neoplasm of overlapping sites of right female breast: Secondary | ICD-10-CM | POA: Diagnosis not present

## 2021-12-29 DIAGNOSIS — Z5112 Encounter for antineoplastic immunotherapy: Secondary | ICD-10-CM | POA: Diagnosis not present

## 2021-12-29 DIAGNOSIS — R197 Diarrhea, unspecified: Secondary | ICD-10-CM | POA: Diagnosis not present

## 2021-12-29 DIAGNOSIS — R140 Abdominal distension (gaseous): Secondary | ICD-10-CM | POA: Diagnosis not present

## 2022-01-04 DIAGNOSIS — C787 Secondary malignant neoplasm of liver and intrahepatic bile duct: Secondary | ICD-10-CM | POA: Diagnosis not present

## 2022-01-05 ENCOUNTER — Encounter: Payer: Self-pay | Admitting: Family Medicine

## 2022-01-09 DIAGNOSIS — I700 Atherosclerosis of aorta: Secondary | ICD-10-CM | POA: Diagnosis not present

## 2022-01-09 DIAGNOSIS — C787 Secondary malignant neoplasm of liver and intrahepatic bile duct: Secondary | ICD-10-CM | POA: Diagnosis not present

## 2022-01-09 DIAGNOSIS — C50811 Malignant neoplasm of overlapping sites of right female breast: Secondary | ICD-10-CM | POA: Diagnosis not present

## 2022-01-09 DIAGNOSIS — C50919 Malignant neoplasm of unspecified site of unspecified female breast: Secondary | ICD-10-CM | POA: Diagnosis not present

## 2022-01-09 DIAGNOSIS — G44219 Episodic tension-type headache, not intractable: Secondary | ICD-10-CM | POA: Diagnosis not present

## 2022-01-09 DIAGNOSIS — R519 Headache, unspecified: Secondary | ICD-10-CM | POA: Diagnosis not present

## 2022-01-09 DIAGNOSIS — K769 Liver disease, unspecified: Secondary | ICD-10-CM | POA: Diagnosis not present

## 2022-01-09 DIAGNOSIS — J8410 Pulmonary fibrosis, unspecified: Secondary | ICD-10-CM | POA: Diagnosis not present

## 2022-01-09 DIAGNOSIS — Z170 Estrogen receptor positive status [ER+]: Secondary | ICD-10-CM | POA: Diagnosis not present

## 2022-01-09 DIAGNOSIS — C7951 Secondary malignant neoplasm of bone: Secondary | ICD-10-CM | POA: Diagnosis not present

## 2022-01-09 DIAGNOSIS — I2510 Atherosclerotic heart disease of native coronary artery without angina pectoris: Secondary | ICD-10-CM | POA: Diagnosis not present

## 2022-01-16 ENCOUNTER — Encounter: Payer: Self-pay | Admitting: Family Medicine

## 2022-01-18 DIAGNOSIS — C50811 Malignant neoplasm of overlapping sites of right female breast: Secondary | ICD-10-CM | POA: Diagnosis not present

## 2022-01-18 DIAGNOSIS — I10 Essential (primary) hypertension: Secondary | ICD-10-CM | POA: Diagnosis not present

## 2022-01-18 DIAGNOSIS — Z170 Estrogen receptor positive status [ER+]: Secondary | ICD-10-CM | POA: Diagnosis not present

## 2022-01-18 DIAGNOSIS — C787 Secondary malignant neoplasm of liver and intrahepatic bile duct: Secondary | ICD-10-CM | POA: Diagnosis not present

## 2022-01-18 DIAGNOSIS — C7951 Secondary malignant neoplasm of bone: Secondary | ICD-10-CM | POA: Diagnosis not present

## 2022-01-19 ENCOUNTER — Encounter: Payer: Self-pay | Admitting: Family Medicine

## 2022-01-19 DIAGNOSIS — Z170 Estrogen receptor positive status [ER+]: Secondary | ICD-10-CM | POA: Diagnosis not present

## 2022-01-19 DIAGNOSIS — C50811 Malignant neoplasm of overlapping sites of right female breast: Secondary | ICD-10-CM | POA: Diagnosis not present

## 2022-01-24 DIAGNOSIS — C7951 Secondary malignant neoplasm of bone: Secondary | ICD-10-CM | POA: Diagnosis not present

## 2022-01-24 DIAGNOSIS — Z170 Estrogen receptor positive status [ER+]: Secondary | ICD-10-CM | POA: Diagnosis not present

## 2022-01-24 DIAGNOSIS — C801 Malignant (primary) neoplasm, unspecified: Secondary | ICD-10-CM | POA: Diagnosis not present

## 2022-01-24 DIAGNOSIS — C50811 Malignant neoplasm of overlapping sites of right female breast: Secondary | ICD-10-CM | POA: Diagnosis not present

## 2022-01-24 DIAGNOSIS — C787 Secondary malignant neoplasm of liver and intrahepatic bile duct: Secondary | ICD-10-CM | POA: Diagnosis not present

## 2022-01-25 ENCOUNTER — Encounter (INDEPENDENT_AMBULATORY_CARE_PROVIDER_SITE_OTHER): Payer: Medicare Other | Admitting: Family Medicine

## 2022-01-25 DIAGNOSIS — Z170 Estrogen receptor positive status [ER+]: Secondary | ICD-10-CM | POA: Diagnosis not present

## 2022-01-25 DIAGNOSIS — R238 Other skin changes: Secondary | ICD-10-CM

## 2022-01-25 DIAGNOSIS — C50811 Malignant neoplasm of overlapping sites of right female breast: Secondary | ICD-10-CM | POA: Diagnosis not present

## 2022-01-25 DIAGNOSIS — C787 Secondary malignant neoplasm of liver and intrahepatic bile duct: Secondary | ICD-10-CM | POA: Diagnosis not present

## 2022-01-25 NOTE — Telephone Encounter (Addendum)
I spent 11 total minutes of online digital evaluation and management services in this patient-initiated request for online care. 

## 2022-01-26 DIAGNOSIS — C50811 Malignant neoplasm of overlapping sites of right female breast: Secondary | ICD-10-CM | POA: Diagnosis not present

## 2022-01-26 DIAGNOSIS — Z170 Estrogen receptor positive status [ER+]: Secondary | ICD-10-CM | POA: Diagnosis not present

## 2022-02-01 ENCOUNTER — Encounter: Payer: Self-pay | Admitting: Family Medicine

## 2022-02-01 DIAGNOSIS — R1011 Right upper quadrant pain: Secondary | ICD-10-CM | POA: Diagnosis not present

## 2022-02-01 DIAGNOSIS — C787 Secondary malignant neoplasm of liver and intrahepatic bile duct: Secondary | ICD-10-CM | POA: Diagnosis not present

## 2022-02-01 DIAGNOSIS — I10 Essential (primary) hypertension: Secondary | ICD-10-CM | POA: Diagnosis not present

## 2022-02-01 DIAGNOSIS — C50811 Malignant neoplasm of overlapping sites of right female breast: Secondary | ICD-10-CM | POA: Diagnosis not present

## 2022-02-01 DIAGNOSIS — Z170 Estrogen receptor positive status [ER+]: Secondary | ICD-10-CM | POA: Diagnosis not present

## 2022-02-02 ENCOUNTER — Telehealth: Payer: Self-pay | Admitting: Cardiovascular Disease

## 2022-02-02 ENCOUNTER — Encounter: Payer: Self-pay | Admitting: Cardiovascular Disease

## 2022-02-02 NOTE — Telephone Encounter (Signed)
Recommend ER.  I do not have availability today.    CM

## 2022-02-02 NOTE — Telephone Encounter (Signed)
Patient c/o Palpitations:  High priority if patient c/o lightheadedness, shortness of breath, or chest pain  How long have you had palpitations/irregular HR/ Afib? Are you having the symptoms now? Palpitations for the last 3 days- think the Prednisone might be causing it  Are you currently experiencing lightheadedness, SOB or CP? Ver anxious, dizziness  a few minutes ago  Do you have a history of afib (atrial fibrillation) or irregular heart rhythm?   Have you checked your BP or HR? (document readings if available):   Are you experiencing any other symptoms?

## 2022-02-02 NOTE — Telephone Encounter (Signed)
Patient called in with complaints of palpitations. Patient reports she was in the store earlier this morning and felt dizzy and as though she were about to pass out.  Patient reports she feels very anxious. Patient had liver biopsy about 10 days ago and was given Prednisone for capsulitis. Patient reports taking a dose of prednisone last night and this morning then current symptoms appeared.   Patient asked if prednisone could cause palpitations and make you feel anxious. Explained to patient that prednisone is a steroid that is typically given to reduce inflammation but can exacerbate anxiety, increase hunger, elevate blood sugar and blood pressure, can also cause swelling. Patient states she does not think she can finish taking the remaining 4 days of prednisone if it is going to make her feel like this. Advised patient to speak with prescribing provider about discontinuing prednisone due to symptoms.  Patient reports feeling better after talking and checked BP while on the phone with a reading of 139/79, HR 87 and no more palpitations.  Also advised patient to stay hydrated, take a walk, and engage in light/relaxing activity. Patient verbalized understanding and expressed appreciation for time taken to speak with her and ease her anxiety.

## 2022-02-08 DIAGNOSIS — I10 Essential (primary) hypertension: Secondary | ICD-10-CM | POA: Diagnosis not present

## 2022-02-08 DIAGNOSIS — K2970 Gastritis, unspecified, without bleeding: Secondary | ICD-10-CM | POA: Diagnosis not present

## 2022-02-08 DIAGNOSIS — Z170 Estrogen receptor positive status [ER+]: Secondary | ICD-10-CM | POA: Diagnosis not present

## 2022-02-08 DIAGNOSIS — C787 Secondary malignant neoplasm of liver and intrahepatic bile duct: Secondary | ICD-10-CM | POA: Diagnosis not present

## 2022-02-08 DIAGNOSIS — C50811 Malignant neoplasm of overlapping sites of right female breast: Secondary | ICD-10-CM | POA: Diagnosis not present

## 2022-02-08 DIAGNOSIS — C7951 Secondary malignant neoplasm of bone: Secondary | ICD-10-CM | POA: Diagnosis not present

## 2022-02-10 DIAGNOSIS — Z170 Estrogen receptor positive status [ER+]: Secondary | ICD-10-CM | POA: Diagnosis not present

## 2022-02-10 DIAGNOSIS — C50811 Malignant neoplasm of overlapping sites of right female breast: Secondary | ICD-10-CM | POA: Diagnosis not present

## 2022-02-15 ENCOUNTER — Other Ambulatory Visit: Payer: Self-pay | Admitting: Medical-Surgical

## 2022-02-15 DIAGNOSIS — C50811 Malignant neoplasm of overlapping sites of right female breast: Secondary | ICD-10-CM | POA: Diagnosis not present

## 2022-02-15 DIAGNOSIS — Z170 Estrogen receptor positive status [ER+]: Secondary | ICD-10-CM | POA: Diagnosis not present

## 2022-02-16 DIAGNOSIS — Z170 Estrogen receptor positive status [ER+]: Secondary | ICD-10-CM | POA: Diagnosis not present

## 2022-02-16 DIAGNOSIS — C50811 Malignant neoplasm of overlapping sites of right female breast: Secondary | ICD-10-CM | POA: Diagnosis not present

## 2022-02-20 ENCOUNTER — Other Ambulatory Visit: Payer: Self-pay | Admitting: Oncology

## 2022-02-20 DIAGNOSIS — Z170 Estrogen receptor positive status [ER+]: Secondary | ICD-10-CM | POA: Diagnosis not present

## 2022-02-20 DIAGNOSIS — C50811 Malignant neoplasm of overlapping sites of right female breast: Secondary | ICD-10-CM | POA: Diagnosis not present

## 2022-02-21 DIAGNOSIS — C50811 Malignant neoplasm of overlapping sites of right female breast: Secondary | ICD-10-CM | POA: Diagnosis not present

## 2022-02-21 DIAGNOSIS — Z170 Estrogen receptor positive status [ER+]: Secondary | ICD-10-CM | POA: Diagnosis not present

## 2022-02-23 ENCOUNTER — Encounter: Payer: Self-pay | Admitting: Family Medicine

## 2022-02-23 DIAGNOSIS — C50811 Malignant neoplasm of overlapping sites of right female breast: Secondary | ICD-10-CM | POA: Diagnosis not present

## 2022-02-23 DIAGNOSIS — I10 Essential (primary) hypertension: Secondary | ICD-10-CM | POA: Diagnosis not present

## 2022-02-23 DIAGNOSIS — R7989 Other specified abnormal findings of blood chemistry: Secondary | ICD-10-CM

## 2022-02-23 DIAGNOSIS — Z170 Estrogen receptor positive status [ER+]: Secondary | ICD-10-CM | POA: Diagnosis not present

## 2022-02-23 DIAGNOSIS — E1169 Type 2 diabetes mellitus with other specified complication: Secondary | ICD-10-CM

## 2022-02-28 ENCOUNTER — Ambulatory Visit (INDEPENDENT_AMBULATORY_CARE_PROVIDER_SITE_OTHER): Payer: Medicare Other | Admitting: Family Medicine

## 2022-02-28 ENCOUNTER — Encounter: Payer: Self-pay | Admitting: Family Medicine

## 2022-02-28 DIAGNOSIS — H1013 Acute atopic conjunctivitis, bilateral: Secondary | ICD-10-CM

## 2022-02-28 NOTE — Patient Instructions (Signed)
Try pataday eye drops.  You may continue the naphcon.   Try warm compresses to help with eye dryness and irritation.

## 2022-02-28 NOTE — Assessment & Plan Note (Signed)
She did not tolerate ketotifen drops very well.  Recommend trying Pataday which is over-the-counter.  She may continue Naphcon as needed.  She will plan to follow-up with her eye doctor as well.

## 2022-02-28 NOTE — Progress Notes (Signed)
[name] - 66 y.o. female MRN [id number]  Date of birth: 1956/03/13  Subjective No chief complaint on file.   HPI [name] is a 66 year old female here today with complaint of itchy, swollen eyes.  She has been seen by her eye doctor previously for this as well.  She has been prescribed several different drops including ketotifen, Naphcon and lubricating drops.  She reports having blurry vision after using ketotifen drops.  Naphcon has helped some.  She has not tried compresses.  She denies any other vision changes, headaches nausea.  She does have upcoming scan and repeat liver function tests for evaluation of her breast cancer and liver metastases as well as response to therapy.  ROS:  A comprehensive ROS was completed and negative except as noted per HPI  Allergies  Allergen Reactions   Penicillins Shortness Of Breath    Reaction as a child Did it involve swelling of the face/tongue/throat, SOB, or low BP? Yes Did it involve sudden or severe rash/hives, skin peeling, or any reaction on the inside of your mouth or nose?Unknown Did you need to seek medical attention at a hospital or doctor's office? Yes When did it last happen? Childhood reaction.    If all above answers are "NO", may proceed with cephalosporin use.    Sulfa Antibiotics Nausea Only   Effexor [Venlafaxine] Other (See Comments)    "dopey"    Past Medical History:  Diagnosis Date   Anxiety    Asthma    Breast cancer ([place])    Chest pain, atypical    Closed head injury 06/06/2017   Depression    Heart palpitations    hx of MVP   Hypertension    IBS (irritable bowel syndrome)    Pre-diabetes     Past Surgical History:  Procedure Laterality Date   BREAST SURGERY  06/2018   right and left breast   eAB     x2    MASTECTOMY MODIFIED RADICAL Bilateral 10/10/2018   Procedure: RIGHT MODIFIED RADICAL MASTECTOMY AND LEFT PROPHYLATIC MASTECTOMY;  Surgeon: [name], MD;  Location: [facility];  Service: General;   Laterality: Bilateral;   PORTACATH PLACEMENT Left 08/12/2018   Procedure: INSERTION PORT-A-CATH;  Surgeon: [name], MD;  Location: [facility];  Service: General;  Laterality: Left;    Social History   Socioeconomic History   Marital status: Divorced    Spouse name: Not on file   Number of children: Not on file   Years of education: Not on file   Highest education level: Not on file  Occupational History   Not on file  Tobacco Use   Smoking status: Never   Smokeless tobacco: Never   Tobacco comments:    nonsmoker  Vaping Use   Vaping Use: Never used  Substance and Sexual Activity   Alcohol use: No   Drug use: No   Sexual activity: Not Currently    Birth control/protection: None  Other Topics Concern   Not on file  Social History Narrative   Divorced; grown son lives in [place].    [facility]- PCP   Social Determinants of Health   Financial Resource Strain: Not on file  Food Insecurity: Not on file  Transportation Needs: Not on file  Physical Activity: Not on file  Stress: Not on file  Social Connections: Not on file    Family History  Problem Relation Age of Onset   Heart attack Father    Lung  cancer Mother    Bladder Cancer Brother     Health Maintenance  Topic Date Due   Zoster Vaccines- Shingrix (1 of 2) Never done   Diabetic kidney evaluation - Urine ACR  04/24/2015   OPHTHALMOLOGY EXAM  11/03/2020   Diabetic kidney evaluation - GFR measurement  12/31/2020   HEMOGLOBIN A1C  10/16/2021   INFLUENZA VACCINE  02/28/2022   Pneumonia Vaccine 31+ Years old (1 - PCV) 10/20/2022 (Originally 11/22/1960)   DEXA SCAN  10/20/2022 (Originally 11/23/2019)   COLONOSCOPY (Pts 45-21yr Insurance coverage will need to be confirmed)  10/20/2022 (Originally 11/23/1999)   FOOT EXAM  04/18/2022   TETANUS/TDAP  03/28/2028   Hepatitis C Screening  Completed   HPV VACCINES  Aged Out   MAMMOGRAM  Discontinued   COVID-19 Vaccine  Discontinued      ----------------------------------------------------------------------------------------------------------------------------------------------------------------------------------------------------------------- Physical Exam BP 119/69 (BP Location: Left Arm, Patient Position: Sitting, Cuff Size: Normal)   Pulse 74   Ht '5\' 4"'$  (1.626 m)   Wt 165 lb (74.8 kg)   SpO2 98%   BMI 28.32 kg/m   Physical Exam Constitutional:      Appearance: Normal appearance.  Eyes:     General: No scleral icterus.    Comments: Mild bilateral erythema.  Lower lid puffiness.  Musculoskeletal:     Cervical back: Neck supple.  Neurological:     Mental Status: She is alert.     ------------------------------------------------------------------------------------------------------------------------------------------------------------------------------------------------------------------- Assessment and Plan  Allergic conjunctivitis She did not tolerate ketotifen drops very well.  Recommend trying Pataday which is over-the-counter.  She may continue Naphcon as needed.  She will plan to follow-up with her eye doctor as well.   No orders of the defined types were placed in this encounter.   No follow-ups on file.    This visit occurred during the SARS-CoV-2 public health emergency.  Safety protocols were in place, including screening questions prior to the visit, additional usage of staff PPE, and extensive cleaning of exam room while observing appropriate contact time as indicated for disinfecting solutions.

## 2022-03-01 DIAGNOSIS — Z170 Estrogen receptor positive status [ER+]: Secondary | ICD-10-CM | POA: Diagnosis not present

## 2022-03-01 DIAGNOSIS — C50811 Malignant neoplasm of overlapping sites of right female breast: Secondary | ICD-10-CM | POA: Diagnosis not present

## 2022-03-03 DIAGNOSIS — C50811 Malignant neoplasm of overlapping sites of right female breast: Secondary | ICD-10-CM | POA: Diagnosis not present

## 2022-03-03 DIAGNOSIS — C787 Secondary malignant neoplasm of liver and intrahepatic bile duct: Secondary | ICD-10-CM | POA: Diagnosis not present

## 2022-03-03 DIAGNOSIS — J8410 Pulmonary fibrosis, unspecified: Secondary | ICD-10-CM | POA: Diagnosis not present

## 2022-03-03 DIAGNOSIS — Z170 Estrogen receptor positive status [ER+]: Secondary | ICD-10-CM | POA: Diagnosis not present

## 2022-03-03 DIAGNOSIS — C7951 Secondary malignant neoplasm of bone: Secondary | ICD-10-CM | POA: Diagnosis not present

## 2022-03-05 ENCOUNTER — Encounter: Payer: Self-pay | Admitting: Family Medicine

## 2022-03-06 ENCOUNTER — Encounter: Payer: Self-pay | Admitting: Family Medicine

## 2022-03-06 DIAGNOSIS — E1169 Type 2 diabetes mellitus with other specified complication: Secondary | ICD-10-CM | POA: Diagnosis not present

## 2022-03-06 DIAGNOSIS — Z0000 Encounter for general adult medical examination without abnormal findings: Secondary | ICD-10-CM | POA: Diagnosis not present

## 2022-03-07 ENCOUNTER — Encounter: Payer: Self-pay | Admitting: Family Medicine

## 2022-03-07 DIAGNOSIS — Z170 Estrogen receptor positive status [ER+]: Secondary | ICD-10-CM | POA: Diagnosis not present

## 2022-03-07 DIAGNOSIS — C7951 Secondary malignant neoplasm of bone: Secondary | ICD-10-CM | POA: Diagnosis not present

## 2022-03-07 DIAGNOSIS — C50811 Malignant neoplasm of overlapping sites of right female breast: Secondary | ICD-10-CM | POA: Diagnosis not present

## 2022-03-07 DIAGNOSIS — I10 Essential (primary) hypertension: Secondary | ICD-10-CM | POA: Diagnosis not present

## 2022-03-07 LAB — LIPID PANEL W/REFLEX DIRECT LDL
Cholesterol: 151 mg/dL (ref <200)
HDL: 54 mg/dL (ref >=50)
LDL Cholesterol (Calc): 78 mg/dL (calc)
Non-HDL Cholesterol (Calc): 97 mg/dL (calc) (ref <130)
Total CHOL/HDL Ratio: 2.8 (calc) (ref <5.0)
Triglycerides: 105 mg/dL (ref <150)

## 2022-03-07 LAB — HEMOGLOBIN A1C
Hgb A1c MFr Bld: 6.3 % of total Hgb — ABNORMAL HIGH (ref <5.7)
Mean Plasma Glucose: 134 mg/dL
eAG (mmol/L): 7.4 mmol/L

## 2022-03-08 DIAGNOSIS — Z170 Estrogen receptor positive status [ER+]: Secondary | ICD-10-CM | POA: Diagnosis not present

## 2022-03-08 DIAGNOSIS — C50811 Malignant neoplasm of overlapping sites of right female breast: Secondary | ICD-10-CM | POA: Diagnosis not present

## 2022-03-09 ENCOUNTER — Telehealth: Payer: Self-pay

## 2022-03-09 NOTE — Telephone Encounter (Signed)
Mirna Mires the pharmacist with [facility] called and left a message. She states they will start [name] on Piqray. She states the [place] will cause hyperglycemia and they will start her on Metformin as well. She wanted to know if Dr Zigmund Daniel will manage the hyperglycemia?

## 2022-03-10 MED ORDER — METFORMIN HCL ER 500 MG PO TB24: ORAL_TABLET | ORAL | 3 refills | Status: DC

## 2022-03-10 NOTE — Telephone Encounter (Signed)
Abbey called back. She states she would like Dr Zigmund Daniel to prescribe the Metformin. She states the treatment will start next week. She is hoping Dr Zigmund Daniel will go ahead and start her on the Metformin.

## 2022-03-10 NOTE — Telephone Encounter (Signed)
Please let them know that patient is refusing Metformin and is very angry at the suggestion of this.   She states that her oncology PA, Gershon Mussel has it all under control and will just adjust the Piqray if her blood sugar gets out of control.    CM

## 2022-03-10 NOTE — Telephone Encounter (Signed)
Metformin sent in and message sent to patient.   CM

## 2022-03-10 NOTE — Telephone Encounter (Signed)
Left message for a return call

## 2022-03-10 NOTE — Telephone Encounter (Signed)
Left a message for a return call.

## 2022-03-13 DIAGNOSIS — M7742 Metatarsalgia, left foot: Secondary | ICD-10-CM | POA: Diagnosis not present

## 2022-03-13 DIAGNOSIS — I10 Essential (primary) hypertension: Secondary | ICD-10-CM | POA: Diagnosis not present

## 2022-03-13 DIAGNOSIS — Q6671 Congenital pes cavus, right foot: Secondary | ICD-10-CM | POA: Diagnosis not present

## 2022-03-13 DIAGNOSIS — Q6672 Congenital pes cavus, left foot: Secondary | ICD-10-CM | POA: Diagnosis not present

## 2022-03-13 DIAGNOSIS — M79672 Pain in left foot: Secondary | ICD-10-CM | POA: Diagnosis not present

## 2022-03-13 DIAGNOSIS — M19079 Primary osteoarthritis, unspecified ankle and foot: Secondary | ICD-10-CM | POA: Diagnosis not present

## 2022-03-13 DIAGNOSIS — M79671 Pain in right foot: Secondary | ICD-10-CM | POA: Diagnosis not present

## 2022-03-13 DIAGNOSIS — L851 Acquired keratosis [keratoderma] palmaris et plantaris: Secondary | ICD-10-CM | POA: Diagnosis not present

## 2022-03-14 ENCOUNTER — Telehealth: Payer: Self-pay

## 2022-03-14 DIAGNOSIS — C50811 Malignant neoplasm of overlapping sites of right female breast: Secondary | ICD-10-CM | POA: Diagnosis not present

## 2022-03-14 DIAGNOSIS — C787 Secondary malignant neoplasm of liver and intrahepatic bile duct: Secondary | ICD-10-CM | POA: Diagnosis not present

## 2022-03-14 DIAGNOSIS — Z5111 Encounter for antineoplastic chemotherapy: Secondary | ICD-10-CM | POA: Diagnosis not present

## 2022-03-14 DIAGNOSIS — Z170 Estrogen receptor positive status [ER+]: Secondary | ICD-10-CM | POA: Diagnosis not present

## 2022-03-14 NOTE — Telephone Encounter (Signed)
Mirna Mires advised that patient refused to start metformin.

## 2022-03-14 NOTE — Telephone Encounter (Signed)
Patient lvm requesting information concerning being locked out of MyChart messaging to Dr. Zigmund Daniel. Requesting the system be fixed or that she be contacted.   Patient wants to update Dr. Zigmund Daniel concerning her appointment today at [facility] and medication changes.

## 2022-03-15 DIAGNOSIS — C787 Secondary malignant neoplasm of liver and intrahepatic bile duct: Secondary | ICD-10-CM | POA: Diagnosis not present

## 2022-03-19 IMAGING — DX DG FOOT COMPLETE 3+V*L*
3 series · 3 of 3 positions shown · non-contrast
Comparison: None.

CLINICAL DATA: Foot pain

EXAM:
LEFT FOOT - COMPLETE 3+ VIEW; RIGHT FOOT COMPLETE - 3+ VIEW

[foot ap]
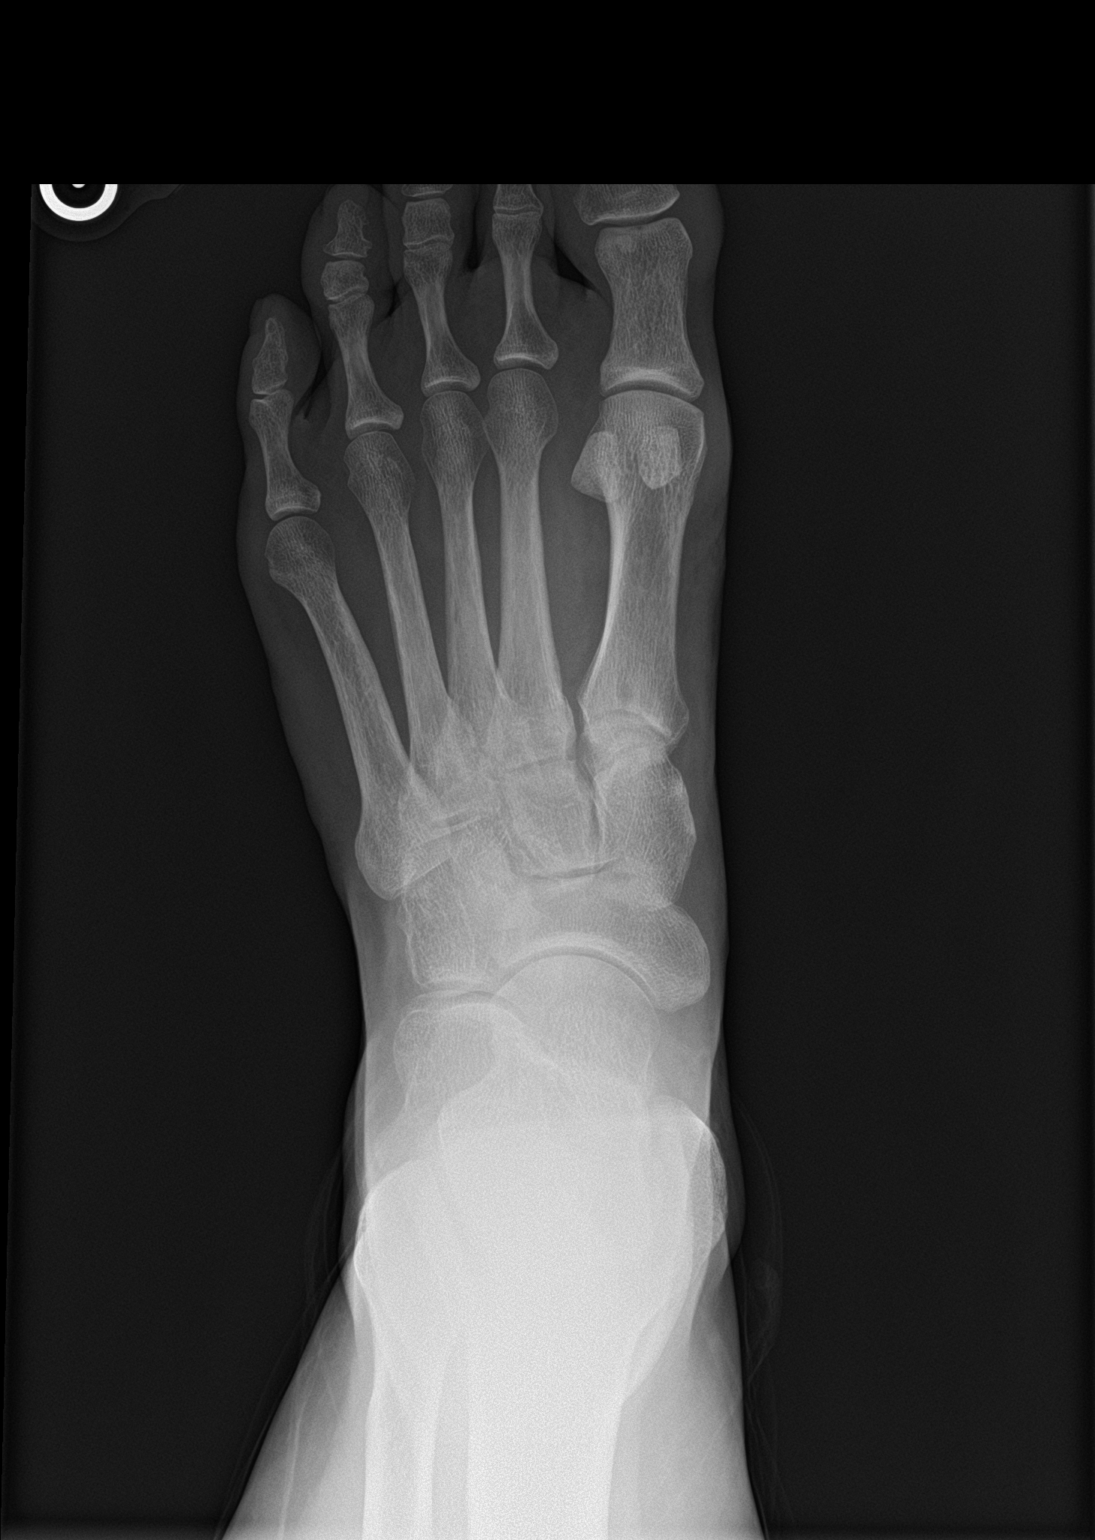

[foot obl]
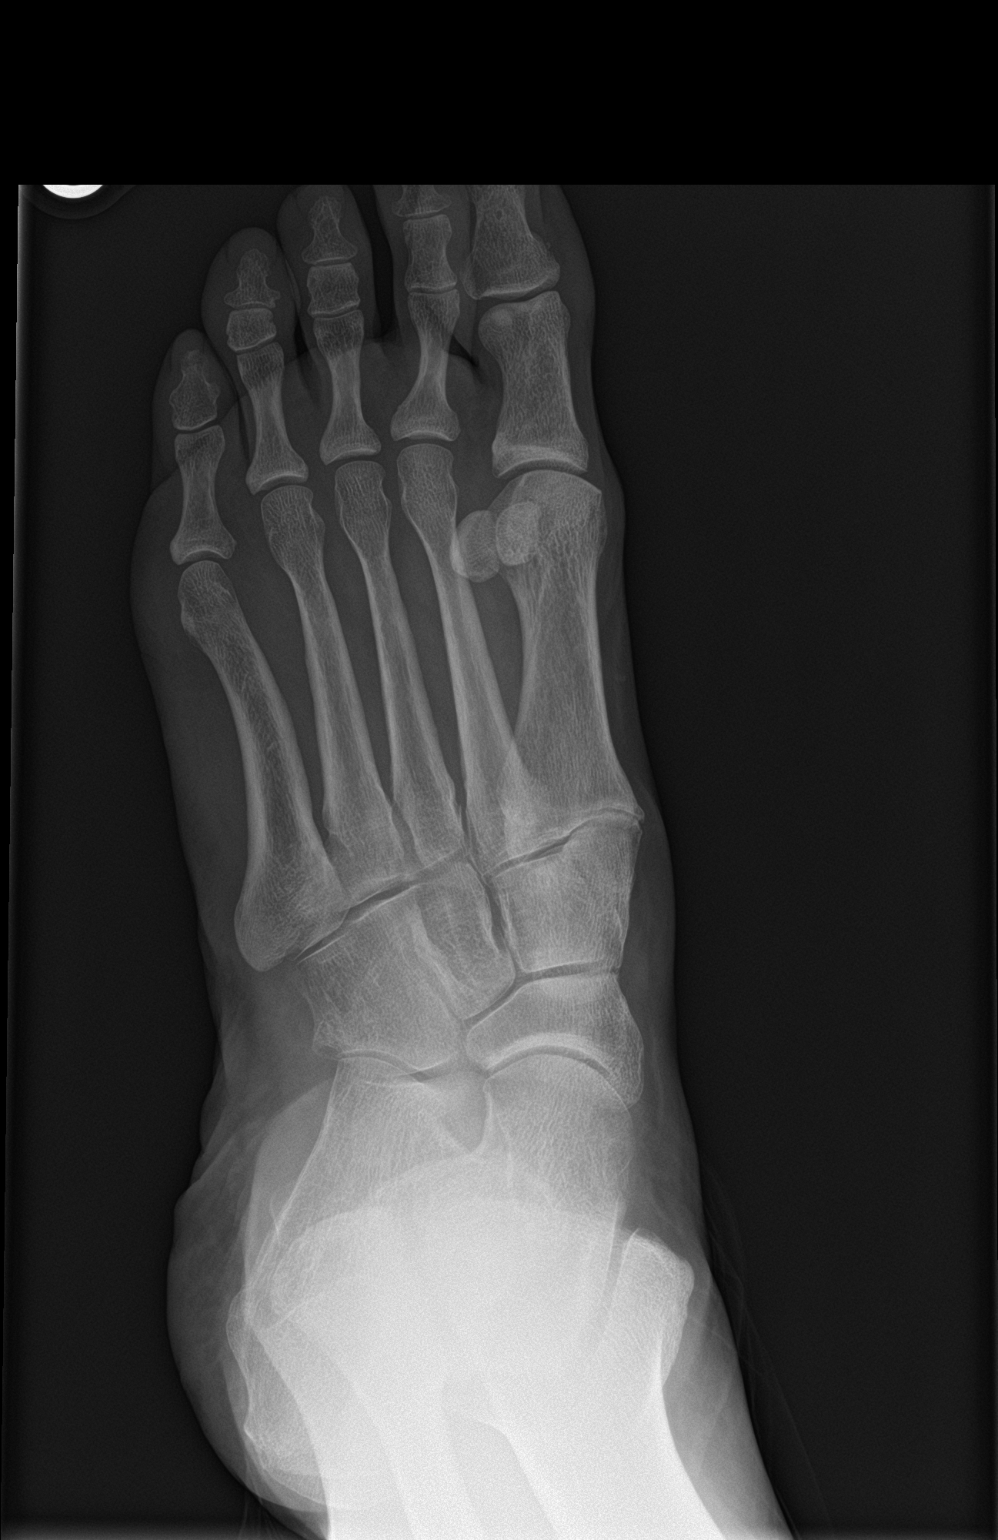

[foot lat]
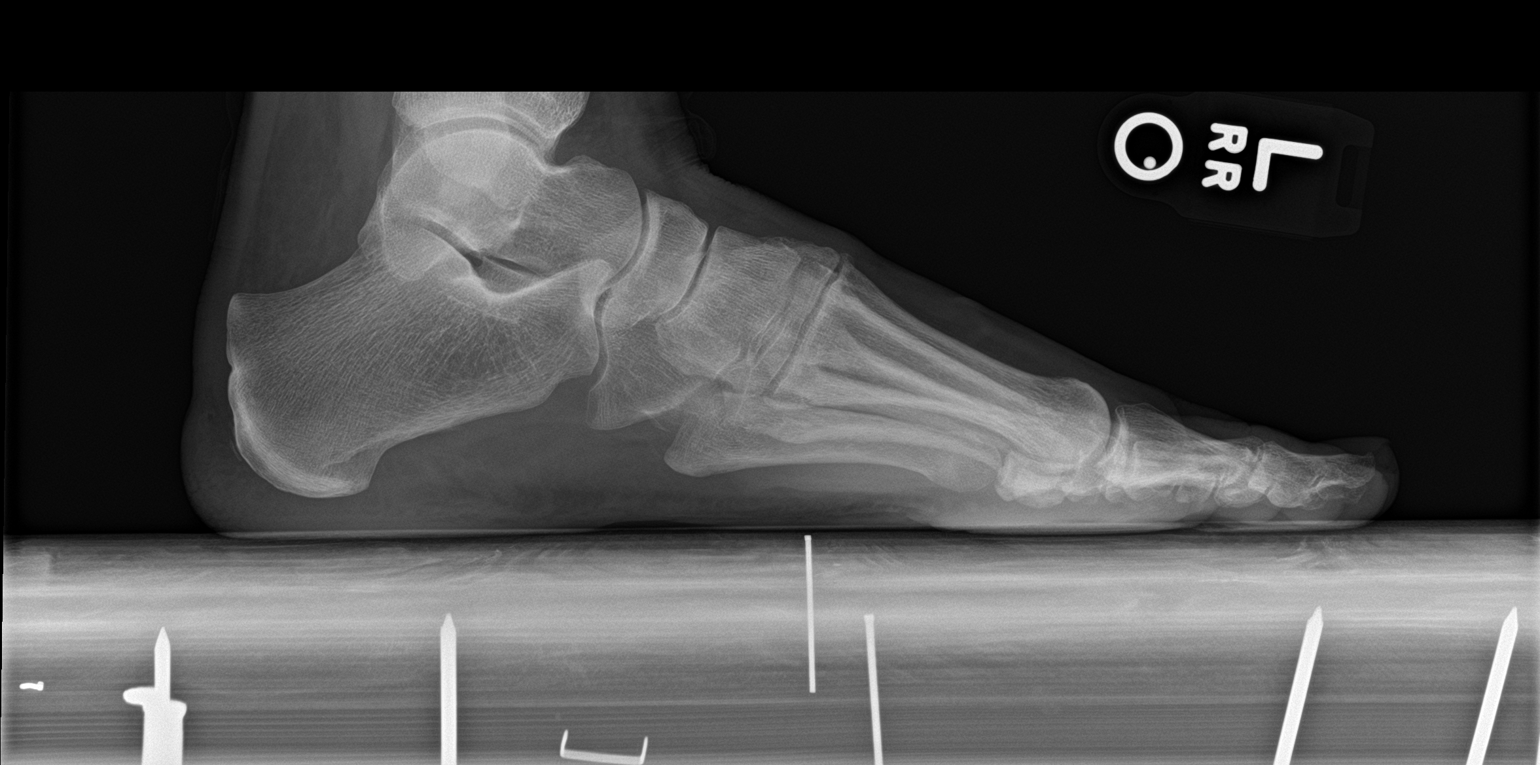

[3 of 3 positions shown; findings below may reference images not displayed]

FINDINGS: Bones: No fracture or dislocation. Normal bone mineralization. No
periostitis.

Joints: Normal alignment. No erosive changes. Joint spaces are
maintained.

Soft tissue: No soft tissue abnormality. No radiopaque foreign body.
No chondrocalcinosis.
IMPRESSION: No significant arthropathy of bilateral feet.

## 2022-03-21 DIAGNOSIS — Z882 Allergy status to sulfonamides status: Secondary | ICD-10-CM | POA: Diagnosis not present

## 2022-03-21 DIAGNOSIS — R450 Nervousness: Secondary | ICD-10-CM | POA: Diagnosis not present

## 2022-03-21 DIAGNOSIS — Z888 Allergy status to other drugs, medicaments and biological substances status: Secondary | ICD-10-CM | POA: Diagnosis not present

## 2022-03-21 DIAGNOSIS — R42 Dizziness and giddiness: Secondary | ICD-10-CM | POA: Diagnosis not present

## 2022-03-21 DIAGNOSIS — Z880 Allergy status to penicillin: Secondary | ICD-10-CM | POA: Diagnosis not present

## 2022-03-21 DIAGNOSIS — R946 Abnormal results of thyroid function studies: Secondary | ICD-10-CM | POA: Diagnosis not present

## 2022-03-21 DIAGNOSIS — R0602 Shortness of breath: Secondary | ICD-10-CM | POA: Diagnosis not present

## 2022-03-21 DIAGNOSIS — R5383 Other fatigue: Secondary | ICD-10-CM | POA: Diagnosis not present

## 2022-03-21 DIAGNOSIS — R9431 Abnormal electrocardiogram [ECG] [EKG]: Secondary | ICD-10-CM | POA: Diagnosis not present

## 2022-03-21 DIAGNOSIS — R079 Chest pain, unspecified: Secondary | ICD-10-CM | POA: Diagnosis not present

## 2022-03-21 DIAGNOSIS — R002 Palpitations: Secondary | ICD-10-CM | POA: Diagnosis not present

## 2022-03-22 ENCOUNTER — Ambulatory Visit (INDEPENDENT_AMBULATORY_CARE_PROVIDER_SITE_OTHER): Payer: Medicare Other | Admitting: Family Medicine

## 2022-03-22 ENCOUNTER — Encounter: Payer: Self-pay | Admitting: Family Medicine

## 2022-03-22 ENCOUNTER — Ambulatory Visit: Payer: Self-pay | Admitting: Licensed Clinical Social Worker

## 2022-03-22 VITALS — BP 147/74 | HR 59 | Ht 64.0 in | Wt 163.0 lb

## 2022-03-22 DIAGNOSIS — R739 Hyperglycemia, unspecified: Secondary | ICD-10-CM | POA: Diagnosis not present

## 2022-03-22 DIAGNOSIS — R7989 Other specified abnormal findings of blood chemistry: Secondary | ICD-10-CM | POA: Diagnosis not present

## 2022-03-22 DIAGNOSIS — E039 Hypothyroidism, unspecified: Secondary | ICD-10-CM | POA: Diagnosis not present

## 2022-03-22 NOTE — Progress Notes (Signed)
Acute Office Visit  Subjective:     Patient ID: [name], female    DOB: 1955/12/16, 66 y.o.   MRN: [id number]  Chief Complaint  Patient presents with   Follow-up    HPI Patient is in today for ER follow up. She presented to the ED with palpitations after taking metformin and laying down. EKG was normal, CXR wnl, and troponins were negative. Her TSH was elevated however no T4 was drawn. She is currently not on any medication for her thyroid.   She stopped taking her metformin '500mg'$  BID. Most recent A1C two weeks ago was 6.3. She is very concerned about her blood sugars and sent mychart messages with readings in the 700s post prandial and then 325.   Review of Systems  Constitutional:  Negative for chills and fever.  Respiratory:  Negative for cough and shortness of breath.   Cardiovascular:  Negative for chest pain.  Neurological:  Negative for headaches.          Objective:    BP (!) 147/74   Pulse (!) 59   Ht '5\' 4"'$  (1.626 m)   Wt 163 lb (73.9 kg)   SpO2 100%   BMI 27.98 kg/m    Physical Exam Vitals and nursing note reviewed.  Constitutional:      General: She is not in acute distress.    Appearance: Normal appearance. She is well-developed.  HENT:     Head: Normocephalic and atraumatic.     Right Ear: External ear normal.     Left Ear: External ear normal.     Nose: Nose normal.  Eyes:     Conjunctiva/sclera: Conjunctivae normal.  Cardiovascular:     Rate and Rhythm: Normal rate and regular rhythm.  Pulmonary:     Effort: Pulmonary effort is normal.     Breath sounds: Normal breath sounds.  Skin:    General: Skin is dry.     Coloration: Skin is not pale.  Neurological:     General: No focal deficit present.     Mental Status: She is alert and oriented to person, place, and time.  Psychiatric:        Mood and Affect: Mood normal.        Behavior: Behavior normal.        Thought Content: Thought content normal.        Judgment: Judgment normal.      No results found for any visits on 03/22/22.      Assessment & Plan:   Problem List Items Addressed This Visit       Other   Hyperglycemia (Chronic)    - pt would like me to call oncology to discuss piqray and her sugars  - Laurie Panda, PA at [phone] - did discuss staying on the metformin and watching her sugars  - spoke with oncology office: take sugars 8hrs fasting and keep on the metformin. If >250 sugars to hold her piqray  - piqray is the cause for hyperglycemia and oncology will work with her to obtain better control.       Elevated TSH - Primary    - have ordered a T4 level since none was drawn in the ED to evaluate for hypothyroidism  - will provide medication based on lab levels - will also send results to [facility] oncology and fax to [phone]      Relevant Orders   TSH + free T4    No orders of the defined types  were placed in this encounter.  For next scheduled follow up.   37mn was spent in total for the visit which includes preparation for the visit via epic notes from her ED visit as well as face to face visit [facility]   EOwens Loffler DO

## 2022-03-22 NOTE — Patient Outreach (Signed)
  Care Coordination   Initial Visit Note   03/22/2022 Name: [name] MRN: [id number] DOB: [birth year]  [name] is a 66 y.o. year old female who sees Luetta Nutting, DO for primary care. I spoke with  [name] by phone today  What matters to the patients health and wellness today?  Patient interested in program support. She agreed to LCSW phone call on 04/04/22 to further discuss program support    Goals Addressed             This Visit's Progress    Patient is interested in program support. Agreed to LCSW phone call 04/04/22 to further discuss program support.       Care Coordination Interventions:  Active listening / Reflection utilized  Described Care Coordination program support to client Reviewed client needs Client reported stress related to managing her medical needs and daily needs Informed client of RN support, LCSW support, and Pharmacy support with program Client agreed to LCSW call back on 04/04/22 at 2:00 PM         SDOH assessments and interventions completed:  No    Care Coordination Interventions Activated:  No  Care Coordination Interventions:  No, not indicated   Follow up plan: Follow up call scheduled for 04/04/22 at 2:00 PM    Encounter Outcome:  Pt. Visit Completed

## 2022-03-22 NOTE — Assessment & Plan Note (Addendum)
-   pt would like me to call oncology to discuss piqray and her sugars  - Laurie Panda, PA at [phone] - did discuss staying on the metformin and watching her sugars  - spoke with oncology office: take sugars 8hrs fasting and keep on the metformin. If >250 sugars to hold her piqray  - piqray is the cause for hyperglycemia and oncology will work with her to obtain better control.

## 2022-03-22 NOTE — Assessment & Plan Note (Signed)
-   have ordered a T4 level since none was drawn in the ED to evaluate for hypothyroidism  - will provide medication based on lab levels - will also send results to [facility] oncology and fax to [phone]

## 2022-03-22 NOTE — Patient Instructions (Signed)
Visit Information  Thank you for taking time to visit with me today. Please don't hesitate to contact me if I can be of assistance to you before our next scheduled telephone appointment.  Following are the goals we discussed today:   Our next appointment is by telephone on 04/04/22 at 2:00 PM  Please call the care guide team at [phone] if you need to cancel or reschedule your appointment.   If you are experiencing a Mental Health or [place] or need someone to talk to, please go to [facility] [place] [phone])   Following is a copy of your full plan of care:   Care Coordination Interventions:  Active listening / Reflection utilized  Described Care Coordination program support to client Reviewed client needs Client reported stress related to managing her medical needs and daily needs Informed client of RN support, LCSW support, and Pharmacy support with program Client agreed to LCSW call back on 04/04/22 at 2:00 PM   Ms. Staron was given information about Care Management services by the embedded care coordination team including:  Care Management services include personalized support from designated clinical staff supervised by her physician, including individualized plan of care and coordination with other care providers 24/7 contact phone numbers for assistance for urgent and routine care needs. The patient may stop CCM services at any time (effective at the end of the month) by phone call to the office staff.  Patient agreed to services and verbal consent obtained.   Norva Riffle.Tyshana Nishida MSW, LCSW Licensed Holiday representative [facility] Care Management [phone]

## 2022-03-23 ENCOUNTER — Encounter: Payer: Self-pay | Admitting: Family Medicine

## 2022-03-23 LAB — TSH+FREE T4: TSH W/REFLEX TO FT4: 4.11 mIU/L (ref 0.40–4.50)

## 2022-03-24 DIAGNOSIS — C787 Secondary malignant neoplasm of liver and intrahepatic bile duct: Secondary | ICD-10-CM | POA: Diagnosis not present

## 2022-03-30 ENCOUNTER — Encounter: Payer: Self-pay | Admitting: Family Medicine

## 2022-03-30 DIAGNOSIS — C787 Secondary malignant neoplasm of liver and intrahepatic bile duct: Secondary | ICD-10-CM | POA: Diagnosis not present

## 2022-03-30 DIAGNOSIS — Z5111 Encounter for antineoplastic chemotherapy: Secondary | ICD-10-CM | POA: Diagnosis not present

## 2022-03-30 DIAGNOSIS — L300 Nummular dermatitis: Secondary | ICD-10-CM | POA: Diagnosis not present

## 2022-03-30 DIAGNOSIS — C7951 Secondary malignant neoplasm of bone: Secondary | ICD-10-CM | POA: Diagnosis not present

## 2022-03-30 DIAGNOSIS — R7989 Other specified abnormal findings of blood chemistry: Secondary | ICD-10-CM | POA: Diagnosis not present

## 2022-03-30 DIAGNOSIS — Z5112 Encounter for antineoplastic immunotherapy: Secondary | ICD-10-CM | POA: Diagnosis not present

## 2022-03-30 DIAGNOSIS — C50811 Malignant neoplasm of overlapping sites of right female breast: Secondary | ICD-10-CM | POA: Diagnosis not present

## 2022-03-30 DIAGNOSIS — R739 Hyperglycemia, unspecified: Secondary | ICD-10-CM | POA: Diagnosis not present

## 2022-03-30 DIAGNOSIS — Z170 Estrogen receptor positive status [ER+]: Secondary | ICD-10-CM | POA: Diagnosis not present

## 2022-04-02 ENCOUNTER — Encounter: Payer: Self-pay | Admitting: Family Medicine

## 2022-04-04 ENCOUNTER — Encounter: Payer: Self-pay | Admitting: Family Medicine

## 2022-04-04 ENCOUNTER — Encounter: Payer: Self-pay | Admitting: Licensed Clinical Social Worker

## 2022-04-05 ENCOUNTER — Ambulatory Visit: Payer: Self-pay | Admitting: Licensed Clinical Social Worker

## 2022-04-05 ENCOUNTER — Encounter (INDEPENDENT_AMBULATORY_CARE_PROVIDER_SITE_OTHER): Payer: Medicare Other | Admitting: Family Medicine

## 2022-04-05 DIAGNOSIS — R21 Rash and other nonspecific skin eruption: Secondary | ICD-10-CM | POA: Diagnosis not present

## 2022-04-05 NOTE — Patient Outreach (Signed)
  Care Coordination   Follow Up Visit Note   04/05/2022 Name: [name] MRN: [id number] DOB: Nov 18, 1955  [name] is a 66 y.o. year old female who sees Luetta Nutting, DO for primary care. I spoke with  [name] by phone today.  What matters to the patients health and wellness today? Client said she goes to get scheduled blood tests regularly. She spoke of financial challenges. She spoke of decreased social support    Goals Addressed             This Visit's Progress    patient stated she was going for scheduled blood tests. Spoke of reduced social support. Spoke of some financial challenges       Care Coordination Interventions:   Mindfulness or Relaxation training provided Active listening / Reflection utilized  Emotional Support Provided Discussed Care Coordination program support with client Provided counseling support with client  Discussed client support with oncologist. Reviewed social support of client. She said she has reduced social support. She has pet dog she enjoys caring for.  LCSW talked with client about activities outside of her home that could expose her to more social interaction        SDOH assessments and interventions completed:  Yes     Care Coordination Interventions Activated:  Yes  Care Coordination Interventions:  Yes, provided   Follow up plan: Follow up call scheduled for 05/03/22 at 11:00 AM    Encounter Outcome:  Pt. Visit Completed

## 2022-04-05 NOTE — Patient Instructions (Signed)
Visit Information  Thank you for taking time to visit with me today. Please don't hesitate to contact me if I can be of assistance to you before our next scheduled telephone appointment.  Following are the goals we discussed today:   Our next appointment is by telephone on 05/03/22 at 11:00 AM  Please call the care guide team at [phone] if you need to cancel or reschedule your appointment.   If you are experiencing a Mental Health or [place] or need someone to talk to, please go to [facility] [phone])   Following is a copy of your full plan of care:   Care Coordination Interventions:  Mindfulness or Relaxation training provided Active listening / Reflection utilized  Emotional Support Provided Discussed Care Coordination program support with client Provided counseling support with client  Discussed client support with oncologist. Reviewed social support of client. She said she has reduced social support. She has pet dog she enjoys caring for.  LCSW talked with client about activities outside of her home that could expose her to more social interaction  Ms. Pulley was given information about Care Management services by the embedded care coordination team including:  Care Management services include personalized support from designated clinical staff supervised by her physician, including individualized plan of care and coordination with other care providers 24/7 contact phone numbers for assistance for urgent and routine care needs. The patient may stop CCM services at any time (effective at the end of the month) by phone call to the office staff.  Patient agreed to services and verbal consent obtained.   Norva Riffle.Bearett Porcaro MSW, [place] Holiday representative [facility] Care Management [phone]

## 2022-04-06 DIAGNOSIS — C50811 Malignant neoplasm of overlapping sites of right female breast: Secondary | ICD-10-CM | POA: Diagnosis not present

## 2022-04-06 DIAGNOSIS — Z170 Estrogen receptor positive status [ER+]: Secondary | ICD-10-CM | POA: Diagnosis not present

## 2022-04-07 NOTE — Telephone Encounter (Signed)
Please see the MyChart message reply(ies) for my assessment and plan.    This patient gave consent for this Medical Advice Message and is aware that it may result in a bill to their insurance company, as well as the possibility of receiving a bill for a co-payment or deductible. They are an established patient, but are not seeking medical advice exclusively about a problem treated during an in person or video visit in the last seven days. I did not recommend an in person or video visit within seven days of my reply.    I spent a total of 11 minutes cumulative time within 7 days through MyChart messaging.  Mae Denunzio, DO   

## 2022-04-11 ENCOUNTER — Encounter: Payer: Self-pay | Admitting: Family Medicine

## 2022-04-13 DIAGNOSIS — Z170 Estrogen receptor positive status [ER+]: Secondary | ICD-10-CM | POA: Diagnosis not present

## 2022-04-13 DIAGNOSIS — C787 Secondary malignant neoplasm of liver and intrahepatic bile duct: Secondary | ICD-10-CM | POA: Diagnosis not present

## 2022-04-13 DIAGNOSIS — Z5112 Encounter for antineoplastic immunotherapy: Secondary | ICD-10-CM | POA: Diagnosis not present

## 2022-04-13 DIAGNOSIS — C50811 Malignant neoplasm of overlapping sites of right female breast: Secondary | ICD-10-CM | POA: Diagnosis not present

## 2022-04-18 ENCOUNTER — Encounter: Payer: Self-pay | Admitting: Family Medicine

## 2022-04-19 ENCOUNTER — Encounter (INDEPENDENT_AMBULATORY_CARE_PROVIDER_SITE_OTHER): Payer: Medicare Other | Admitting: Family Medicine

## 2022-04-19 DIAGNOSIS — D696 Thrombocytopenia, unspecified: Secondary | ICD-10-CM

## 2022-04-20 DIAGNOSIS — Z170 Estrogen receptor positive status [ER+]: Secondary | ICD-10-CM | POA: Diagnosis not present

## 2022-04-20 DIAGNOSIS — C50811 Malignant neoplasm of overlapping sites of right female breast: Secondary | ICD-10-CM | POA: Diagnosis not present

## 2022-04-21 NOTE — Telephone Encounter (Signed)
Please see the MyChart message reply(ies) for my assessment and plan.    This patient gave consent for this Medical Advice Message and is aware that it may result in a bill to their insurance company, as well as the possibility of receiving a bill for a co-payment or deductible. They are an established patient, but are not seeking medical advice exclusively about a problem treated during an in person or video visit in the last seven days. I did not recommend an in person or video visit within seven days of my reply.    I spent a total of 6 minutes cumulative time within 7 days through MyChart messaging.  Simuel Stebner, DO   

## 2022-04-24 ENCOUNTER — Encounter: Payer: Self-pay | Admitting: Family Medicine

## 2022-04-25 ENCOUNTER — Ambulatory Visit: Payer: Medicare Other | Admitting: Family Medicine

## 2022-04-25 ENCOUNTER — Encounter: Payer: Self-pay | Admitting: Family Medicine

## 2022-04-27 DIAGNOSIS — C50811 Malignant neoplasm of overlapping sites of right female breast: Secondary | ICD-10-CM | POA: Diagnosis not present

## 2022-04-27 DIAGNOSIS — Z170 Estrogen receptor positive status [ER+]: Secondary | ICD-10-CM | POA: Diagnosis not present

## 2022-04-28 ENCOUNTER — Encounter: Payer: Self-pay | Admitting: Family Medicine

## 2022-05-01 ENCOUNTER — Encounter: Payer: Self-pay | Admitting: Family Medicine

## 2022-05-02 ENCOUNTER — Encounter: Payer: Self-pay | Admitting: Family Medicine

## 2022-05-03 ENCOUNTER — Encounter: Payer: Self-pay | Admitting: Family Medicine

## 2022-05-03 ENCOUNTER — Encounter: Payer: Self-pay | Admitting: Licensed Clinical Social Worker

## 2022-05-03 DIAGNOSIS — Z1211 Encounter for screening for malignant neoplasm of colon: Secondary | ICD-10-CM | POA: Diagnosis not present

## 2022-05-03 DIAGNOSIS — J45909 Unspecified asthma, uncomplicated: Secondary | ICD-10-CM | POA: Diagnosis not present

## 2022-05-03 DIAGNOSIS — K648 Other hemorrhoids: Secondary | ICD-10-CM | POA: Diagnosis not present

## 2022-05-03 DIAGNOSIS — R195 Other fecal abnormalities: Secondary | ICD-10-CM | POA: Diagnosis not present

## 2022-05-03 LAB — HM COLONOSCOPY

## 2022-05-04 DIAGNOSIS — Z170 Estrogen receptor positive status [ER+]: Secondary | ICD-10-CM | POA: Diagnosis not present

## 2022-05-04 DIAGNOSIS — C50811 Malignant neoplasm of overlapping sites of right female breast: Secondary | ICD-10-CM | POA: Diagnosis not present

## 2022-05-08 ENCOUNTER — Encounter: Payer: Self-pay | Admitting: Family Medicine

## 2022-05-09 DIAGNOSIS — C50811 Malignant neoplasm of overlapping sites of right female breast: Secondary | ICD-10-CM | POA: Diagnosis not present

## 2022-05-09 DIAGNOSIS — Z170 Estrogen receptor positive status [ER+]: Secondary | ICD-10-CM | POA: Diagnosis not present

## 2022-05-09 DIAGNOSIS — N281 Cyst of kidney, acquired: Secondary | ICD-10-CM | POA: Diagnosis not present

## 2022-05-09 DIAGNOSIS — C787 Secondary malignant neoplasm of liver and intrahepatic bile duct: Secondary | ICD-10-CM | POA: Diagnosis not present

## 2022-05-09 DIAGNOSIS — R918 Other nonspecific abnormal finding of lung field: Secondary | ICD-10-CM | POA: Diagnosis not present

## 2022-05-09 DIAGNOSIS — C7951 Secondary malignant neoplasm of bone: Secondary | ICD-10-CM | POA: Diagnosis not present

## 2022-05-11 ENCOUNTER — Encounter: Payer: Self-pay | Admitting: Cardiovascular Disease

## 2022-05-11 ENCOUNTER — Encounter: Payer: Self-pay | Admitting: Family Medicine

## 2022-05-11 DIAGNOSIS — R7989 Other specified abnormal findings of blood chemistry: Secondary | ICD-10-CM | POA: Diagnosis not present

## 2022-05-11 DIAGNOSIS — Z170 Estrogen receptor positive status [ER+]: Secondary | ICD-10-CM | POA: Diagnosis not present

## 2022-05-11 DIAGNOSIS — I10 Essential (primary) hypertension: Secondary | ICD-10-CM | POA: Diagnosis not present

## 2022-05-11 DIAGNOSIS — C7951 Secondary malignant neoplasm of bone: Secondary | ICD-10-CM | POA: Diagnosis not present

## 2022-05-11 DIAGNOSIS — Z5111 Encounter for antineoplastic chemotherapy: Secondary | ICD-10-CM | POA: Diagnosis not present

## 2022-05-11 DIAGNOSIS — C787 Secondary malignant neoplasm of liver and intrahepatic bile duct: Secondary | ICD-10-CM | POA: Diagnosis not present

## 2022-05-11 DIAGNOSIS — C50811 Malignant neoplasm of overlapping sites of right female breast: Secondary | ICD-10-CM | POA: Diagnosis not present

## 2022-05-11 DIAGNOSIS — Z9189 Other specified personal risk factors, not elsewhere classified: Secondary | ICD-10-CM | POA: Diagnosis not present

## 2022-05-12 ENCOUNTER — Encounter: Payer: Self-pay | Admitting: Family Medicine

## 2022-05-12 DIAGNOSIS — E1169 Type 2 diabetes mellitus with other specified complication: Secondary | ICD-10-CM

## 2022-05-12 MED ORDER — ACCU-CHEK GUIDE W/DEVICE KIT: PACK | 0 refills | Status: DC

## 2022-05-12 MED ORDER — LANCETS 30G MISC: 99 refills | Status: DC

## 2022-05-12 MED ORDER — ACCU-CHEK GUIDE VI STRP: ORAL_STRIP | 12 refills | Status: DC

## 2022-05-15 ENCOUNTER — Encounter: Payer: Self-pay | Admitting: Family Medicine

## 2022-05-16 ENCOUNTER — Encounter: Payer: Self-pay | Admitting: Family Medicine

## 2022-05-16 NOTE — Telephone Encounter (Signed)
Can we get her in here to talk about her blood sugar?   The back and forth on mychart is not effective.  Thanks!

## 2022-05-17 ENCOUNTER — Encounter: Payer: Self-pay | Admitting: Family Medicine

## 2022-05-17 ENCOUNTER — Telehealth: Payer: Self-pay | Admitting: *Deleted

## 2022-05-17 NOTE — Chronic Care Management (AMB) (Signed)
  Care Coordination Note  05/17/2022 Name: [name] MRN: [id number] DOB: [birth year]  [name] is a 66 y.o. year old female who is a primary care patient of Luetta Nutting, DO and is actively engaged with the care management team. I reached out to Janalee Dane by phone today to assist with re-scheduling a follow up visit with the Licensed Clinical Social Worker  Follow up plan: Unsuccessful telephone outreach attempt made. A HIPAA compliant phone message was left for the patient providing contact information and requesting a return call.  [place]  Direct Dial: [phone]

## 2022-05-18 ENCOUNTER — Telehealth: Payer: Self-pay | Admitting: Family Medicine

## 2022-05-18 ENCOUNTER — Encounter: Payer: Self-pay | Admitting: Family Medicine

## 2022-05-18 DIAGNOSIS — C50811 Malignant neoplasm of overlapping sites of right female breast: Secondary | ICD-10-CM | POA: Diagnosis not present

## 2022-05-18 DIAGNOSIS — Z170 Estrogen receptor positive status [ER+]: Secondary | ICD-10-CM | POA: Diagnosis not present

## 2022-05-18 NOTE — Telephone Encounter (Signed)
Patient said she left message not sure what do at this time she feels she is Dehydrated and she cant go having a headache for another 30 hours . What can she do?

## 2022-05-18 NOTE — Telephone Encounter (Signed)
Recommend urgent care visit if symptoms are not improving.   CM

## 2022-05-19 ENCOUNTER — Encounter: Payer: Self-pay | Admitting: Family Medicine

## 2022-05-19 DIAGNOSIS — C50811 Malignant neoplasm of overlapping sites of right female breast: Secondary | ICD-10-CM | POA: Diagnosis not present

## 2022-05-19 DIAGNOSIS — Z170 Estrogen receptor positive status [ER+]: Secondary | ICD-10-CM | POA: Diagnosis not present

## 2022-05-23 NOTE — Chronic Care Management (AMB) (Signed)
  Care Coordination Note  05/23/2022 Name: [name] MRN: [id number] DOB: January 18, 1956  [name] is a 66 y.o. year old female who is a primary care patient of Luetta Nutting, DO and is actively engaged with the care management team. I reached out to Janalee Dane by phone today to assist with re-scheduling a follow up visit with the Licensed Clinical Social Worker  Follow up plan: Unsuccessful telephone outreach attempt made. A HIPAA compliant phone message was left for the patient providing contact information and requesting a return call.   [place]  Direct Dial: [phone]

## 2022-05-24 ENCOUNTER — Ambulatory Visit (INDEPENDENT_AMBULATORY_CARE_PROVIDER_SITE_OTHER): Payer: Medicare Other | Admitting: Family Medicine

## 2022-05-24 ENCOUNTER — Encounter: Payer: Self-pay | Admitting: Family Medicine

## 2022-05-24 VITALS — BP 150/69 | HR 75 | Ht 64.0 in | Wt 157.0 lb

## 2022-05-24 DIAGNOSIS — E1169 Type 2 diabetes mellitus with other specified complication: Secondary | ICD-10-CM

## 2022-05-24 DIAGNOSIS — I10 Essential (primary) hypertension: Secondary | ICD-10-CM

## 2022-05-24 DIAGNOSIS — L2082 Flexural eczema: Secondary | ICD-10-CM | POA: Diagnosis not present

## 2022-05-24 DIAGNOSIS — Z8639 Personal history of other endocrine, nutritional and metabolic disease: Secondary | ICD-10-CM | POA: Insufficient documentation

## 2022-05-24 DIAGNOSIS — E1165 Type 2 diabetes mellitus with hyperglycemia: Secondary | ICD-10-CM

## 2022-05-24 DIAGNOSIS — C50919 Malignant neoplasm of unspecified site of unspecified female breast: Secondary | ICD-10-CM

## 2022-05-24 LAB — POCT UA - MICROALBUMIN
Albumin/Creatinine Ratio, Urine, POC: <30
Creatinine, POC: 100 mg/dL
Microalbumin Ur, POC: 30 mg/L

## 2022-05-24 MED ORDER — GLIPIZIDE 5 MG PO TABS: 2.5000 mg | ORAL_TABLET | Freq: Every day | ORAL | 0 refills | Status: DC

## 2022-05-24 MED ORDER — CLOBETASOL PROPIONATE 0.05 % EX CREA: 1.0000 | TOPICAL_CREAM | Freq: Two times a day (BID) | CUTANEOUS | 3 refills | Status: DC

## 2022-05-24 MED ORDER — CLOBETASOL PROPIONATE 0.05 % EX SOLN: 1.0000 | Freq: Two times a day (BID) | CUTANEOUS | 3 refills | Status: DC

## 2022-05-24 NOTE — Progress Notes (Signed)
[name] - 66 y.o. female MRN [id number]  Date of birth: 1955/10/23  Subjective Chief Complaint  Patient presents with   Numbness    HPI   [name] is a 66 year old female here today for follow-up visit.  She continues to undergo treatment of her metastatic breast cancer.  Overall she is doing pretty well with this.  She is on Piqray now which has caused worsening hyperglycemia.  Currently taking metformin.  Tolerating pretty well.  Blood sugars remain elevated at home with readings typically over 200.    She does have rash along her scalp as well as her lower back.  She has tried triamcinolone with slight improvement.  Rash is very itchy.  ROS:  A comprehensive ROS was completed and negative except as noted per HPI  Allergies  Allergen Reactions   Penicillins Shortness Of Breath    Reaction as a child Did it involve swelling of the face/tongue/throat, SOB, or low BP? Yes Did it involve sudden or severe rash/hives, skin peeling, or any reaction on the inside of your mouth or nose?Unknown Did you need to seek medical attention at a hospital or doctor's office? Yes When did it last happen? Childhood reaction.    If all above answers are "NO", may proceed with cephalosporin use.    Sulfa Antibiotics Nausea Only   Effexor [Venlafaxine] Other (See Comments)    "dopey"    Past Medical History:  Diagnosis Date   Anxiety    Asthma    Breast cancer ([place])    Chest pain, atypical    Closed head injury 06/06/2017   Depression    Heart palpitations    hx of MVP   Hypertension    IBS (irritable bowel syndrome)    Pre-diabetes     Past Surgical History:  Procedure Laterality Date   BREAST SURGERY  06/2018   right and left breast   eAB     x2    MASTECTOMY MODIFIED RADICAL Bilateral 10/10/2018   Procedure: RIGHT MODIFIED RADICAL MASTECTOMY AND LEFT PROPHYLATIC MASTECTOMY;  Surgeon: [name], MD;  Location: [facility];  Service: General;  Laterality: Bilateral;   PORTACATH  PLACEMENT Left 08/12/2018   Procedure: INSERTION PORT-A-CATH;  Surgeon: [name], MD;  Location: [place];  Service: General;  Laterality: Left;    Social History   Socioeconomic History   Marital status: Divorced    Spouse name: Not on file   Number of children: Not on file   Years of education: Not on file   Highest education level: Not on file  Occupational History   Not on file  Tobacco Use   Smoking status: Never   Smokeless tobacco: Never   Tobacco comments:    nonsmoker  Vaping Use   Vaping Use: Never used  Substance and Sexual Activity   Alcohol use: No   Drug use: No   Sexual activity: Not Currently    Birth control/protection: None  Other Topics Concern   Not on file  Social History Narrative   Divorced; grown son lives in [place].    [facility]- PCP   Social Determinants of Health   Financial Resource Strain: Not on file  Food Insecurity: Not on file  Transportation Needs: Not on file  Physical Activity: Not on file  Stress: Stress Concern Present (04/05/2022)   [place]    Feeling of Stress : To some extent  Social Connections: Not  on file    Family History  Problem Relation Age of Onset   Heart attack Father    Lung cancer Mother    Bladder Cancer Brother     Health Maintenance  Topic Date Due   Diabetic kidney evaluation - GFR measurement  12/31/2020   Zoster Vaccines- Shingrix (1 of 2) 08/24/2022 (Originally 11/22/1973)   OPHTHALMOLOGY EXAM  08/31/2022 (Originally 11/03/2020)   Medicare Annual Wellness (AWV)  08/31/2022 (Originally 1956-07-04)   Pneumonia Vaccine 90+ Years old (1 - PCV) 10/20/2022 (Originally 11/22/1960)   DEXA SCAN  10/20/2022 (Originally 11/23/2019)   INFLUENZA VACCINE  10/29/2022 (Originally 02/28/2022)   HEMOGLOBIN A1C  09/06/2022   Diabetic kidney evaluation - Urine ACR  05/25/2023   FOOT EXAM  05/25/2023   TETANUS/TDAP  03/28/2028    COLONOSCOPY (Pts 45-87yr Insurance coverage will need to be confirmed)  05/03/2032   Hepatitis C Screening  Completed   HPV VACCINES  Aged Out   MAMMOGRAM  Discontinued   COVID-19 Vaccine  Discontinued     ----------------------------------------------------------------------------------------------------------------------------------------------------------------------------------------------------------------- Physical Exam BP (!) 150/69 (BP Location: Left Arm, Patient Position: Sitting, Cuff Size: Normal)   Pulse 75   Ht '5\' 4"'$  (1.626 m)   Wt 157 lb (71.2 kg)   SpO2 100%   BMI 26.95 kg/m   Physical Exam Constitutional:      Appearance: Normal appearance.  Eyes:     General: No scleral icterus. Cardiovascular:     Rate and Rhythm: Normal rate and regular rhythm.  Pulmonary:     Effort: Pulmonary effort is normal.     Breath sounds: Normal breath sounds.  Musculoskeletal:     Cervical back: Neck supple.  Skin:    Comments: Scaly, hypertrophic rash along the lower back.  Excoriation is present.  Similar rash along the occipital and parietal scalp.  Neurological:     Mental Status: She is alert.  Psychiatric:        Mood and Affect: Mood normal.        Behavior: Behavior normal.     ------------------------------------------------------------------------------------------------------------------------------------------------------------------------------------------------------------------- Assessment and Plan  Essential hypertension Blood pressure is elevated.  Reports readings are better at home.  Recommend continue monitoring at home.  Eczematous dermatitis Adding clobetasol cream to use on the back as well as clobetasol solution to use on the scalp.  She will let me know if this is not working.  Type 2 diabetes mellitus with hyperglycemia (HCC) History of prediabetes that [facility]  We will continue metformin at current  strength as well as adding glipizide due to her continued hyperglycemia.  Stage IV breast cancer in female ([facility] [place]  Has shown decent response to this.   Meds ordered this encounter  Medications   glipiZIDE (GLUCOTROL) 5 MG tablet    Sig: Take 0.5 tablets (2.5 mg total) by mouth daily before breakfast.    Dispense:  90 tablet    Refill:  0   clobetasol (TEMOVATE) 0.05 % external solution    Sig: Apply 1 Application topically 2 (two) times daily. Use on scalp.  Use for up to 2 weeks and then discontinue for 10 days.  May repeat cycle as needed.    Dispense:  50 mL    Refill:  3   clobetasol cream (TEMOVATE) 0.05 %    Sig: Apply 1 Application topically 2 (two) times daily.    Dispense:  60 g    Refill:  3    Return in  about 6 weeks (around 07/05/2022) for blood sugar.    This visit occurred during the SARS-CoV-2 public health emergency.  Safety protocols were in place, including screening questions prior to the visit, additional usage of staff PPE, and extensive cleaning of exam room while observing appropriate contact time as indicated for disinfecting solutions.

## 2022-05-24 NOTE — Assessment & Plan Note (Signed)
Blood pressure is elevated.  Reports readings are better at home.  Recommend continue monitoring at home.

## 2022-05-24 NOTE — Assessment & Plan Note (Signed)
Adding clobetasol cream to use on the back as well as clobetasol solution to use on the scalp.  She will let me know if this is not working.

## 2022-05-24 NOTE — Patient Instructions (Signed)
Use clobetasol cream on the rash on the trunk.   Use clobetasol SOLUTION on the scalp.   Try glipizide 2.'5mg'$  (1/2 tab) daily.    See me again in 6 weeks.    Diabetes Mellitus and Nutrition, Adult When you have diabetes, or diabetes mellitus, it is very important to have healthy eating habits because your blood sugar (glucose) levels are greatly affected by what you eat and drink. Eating healthy foods in the right amounts, at about the same times every day, can help you: Manage your blood glucose. Lower your risk of heart disease. Improve your blood pressure. Reach or maintain a healthy weight. What can affect my meal plan? Every person with diabetes is different, and each person has different needs for a meal plan. Your health care provider may recommend that you work with a dietitian to make a meal plan that is best for you. Your meal plan may vary depending on factors such as: The calories you need. The medicines you take. Your weight. Your blood glucose, blood pressure, and cholesterol levels. Your activity level. Other health conditions you have, such as heart or kidney disease. How do carbohydrates affect me? Carbohydrates, also called carbs, affect your blood glucose level more than any other type of food. Eating carbs raises the amount of glucose in your blood. It is important to know how many carbs you can safely have in each meal. This is different for every person. Your dietitian can help you calculate how many carbs you should have at each meal and for each snack. How does alcohol affect me? Alcohol can cause a decrease in blood glucose (hypoglycemia), especially if you use insulin or take certain diabetes medicines by mouth. Hypoglycemia can be a life-threatening condition. Symptoms of hypoglycemia, such as sleepiness, dizziness, and confusion, are similar to symptoms of having too much alcohol. Do not drink alcohol if: Your health care provider tells you not to drink. You  are pregnant, may be pregnant, or are planning to become pregnant. If you drink alcohol: Limit how much you have to: 0-1 drink a day for women. 0-2 drinks a day for men. Know how much alcohol is in your drink. In the [place] one drink equals one 12 oz bottle of beer (355 mL), one 5 oz glass of wine (148 mL), or one 1 oz glass of hard liquor (44 mL). Keep yourself hydrated with water, diet soda, or unsweetened iced tea. Keep in mind that regular soda, juice, and other mixers may contain a lot of sugar and must be counted as carbs. What are tips for following this plan?  Reading food labels Start by checking the serving size on the Nutrition Facts label of packaged foods and drinks. The number of calories and the amount of carbs, fats, and other nutrients listed on the label are based on one serving of the item. Many items contain more than one serving per package. Check the total grams (g) of carbs in one serving. Check the number of grams of saturated fats and trans fats in one serving. Choose foods that have a low amount or none of these fats. Check the number of milligrams (mg) of salt (sodium) in one serving. Most people should limit total sodium intake to less than 2,300 mg per day. Always check the nutrition information of foods labeled as "low-fat" or "nonfat." These foods may be higher in added sugar or refined carbs and should be avoided. Talk to your dietitian to identify your daily goals for nutrients  listed on the label. Shopping Avoid buying canned, pre-made, or processed foods. These foods tend to be high in fat, sodium, and added sugar. Shop around the outside edge of the grocery store. This is where you will most often find fresh fruits and vegetables, bulk grains, fresh meats, and fresh dairy products. Cooking Use low-heat cooking methods, such as baking, instead of high-heat cooking methods, such as deep frying. Cook using healthy oils, such as olive, canola, or sunflower  oil. Avoid cooking with butter, cream, or high-fat meats. Meal planning Eat meals and snacks regularly, preferably at the same times every day. Avoid going long periods of time without eating. Eat foods that are high in fiber, such as fresh fruits, vegetables, beans, and whole grains. Eat 4-6 oz (112-168 g) of lean protein each day, such as lean meat, chicken, fish, eggs, or tofu. One ounce (oz) (28 g) of lean protein is equal to: 1 oz (28 g) of meat, chicken, or fish. 1 egg.  cup (62 g) of tofu. Eat some foods each day that contain healthy fats, such as avocado, nuts, seeds, and fish. What foods should I eat? Fruits Berries. Apples. Oranges. Peaches. Apricots. Plums. Grapes. Mangoes. Papayas. Pomegranates. Kiwi. Cherries. Vegetables Leafy greens, including lettuce, spinach, kale, chard, collard greens, mustard greens, and cabbage. Beets. Cauliflower. Broccoli. Carrots. Green beans. Tomatoes. Peppers. Onions. Cucumbers. Brussels sprouts. Grains Whole grains, such as whole-wheat or whole-grain bread, crackers, tortillas, cereal, and pasta. Unsweetened oatmeal. Quinoa. Brown or wild rice. Meats and other proteins Seafood. Poultry without skin. Lean cuts of poultry and beef. Tofu. Nuts. Seeds. Dairy Low-fat or fat-free dairy products such as milk, yogurt, and cheese. The items listed above may not be a complete list of foods and beverages you can eat and drink. Contact a dietitian for more information. What foods should I avoid? Fruits Fruits canned with syrup. Vegetables Canned vegetables. Frozen vegetables with butter or cream sauce. Grains Refined white flour and flour products such as bread, pasta, snack foods, and cereals. Avoid all processed foods. Meats and other proteins Fatty cuts of meat. Poultry with skin. Breaded or fried meats. Processed meat. Avoid saturated fats. Dairy Full-fat yogurt, cheese, or milk. Beverages Sweetened drinks, such as soda or iced tea. The items  listed above may not be a complete list of foods and beverages you should avoid. Contact a dietitian for more information. Questions to ask a health care provider Do I need to meet with a certified diabetes care and education specialist? Do I need to meet with a dietitian? What number can I call if I have questions? When are the best times to check my blood glucose? Where to find more information: American Diabetes Association: diabetes.org Academy of Nutrition and Dietetics: eatright.[employer] of Diabetes and Digestive and Kidney Diseases: [employer]en[employer].is Association of Diabetes Care & Education Specialists: diabeteseducator.org Summary It is important to have healthy eating habits because your blood sugar (glucose) levels are greatly affected by what you eat and drink. It is important to use alcohol carefully. A healthy meal plan will help you manage your blood glucose and lower your risk of heart disease. Your health care provider may recommend that you work with a dietitian to make a meal plan that is best for you. This information is not intended to replace advice given to you by your health care provider. Make sure you discuss any questions you have with your health care provider. Document Revised: 02/18/2020 Document Reviewed: 02/18/2020 Elsevier Patient Education  2023 Elsevier  Inc.  

## 2022-05-24 NOTE — Assessment & Plan Note (Signed)
Continues on Piqray.  Has shown decent response to this.

## 2022-05-24 NOTE — Assessment & Plan Note (Signed)
History of prediabetes that has progressed to diabetes due to [facility].  We will continue metformin at current strength as well as adding glipizide due to her continued hyperglycemia.

## 2022-05-25 ENCOUNTER — Encounter: Payer: Self-pay | Admitting: Family Medicine

## 2022-05-25 DIAGNOSIS — Z170 Estrogen receptor positive status [ER+]: Secondary | ICD-10-CM | POA: Diagnosis not present

## 2022-05-25 DIAGNOSIS — C787 Secondary malignant neoplasm of liver and intrahepatic bile duct: Secondary | ICD-10-CM | POA: Diagnosis not present

## 2022-05-25 DIAGNOSIS — C7951 Secondary malignant neoplasm of bone: Secondary | ICD-10-CM | POA: Diagnosis not present

## 2022-05-25 DIAGNOSIS — R7989 Other specified abnormal findings of blood chemistry: Secondary | ICD-10-CM | POA: Diagnosis not present

## 2022-05-25 DIAGNOSIS — C50811 Malignant neoplasm of overlapping sites of right female breast: Secondary | ICD-10-CM | POA: Diagnosis not present

## 2022-05-26 ENCOUNTER — Encounter: Payer: Self-pay | Admitting: Family Medicine

## 2022-05-29 DIAGNOSIS — C50811 Malignant neoplasm of overlapping sites of right female breast: Secondary | ICD-10-CM | POA: Diagnosis not present

## 2022-05-29 DIAGNOSIS — R739 Hyperglycemia, unspecified: Secondary | ICD-10-CM | POA: Diagnosis not present

## 2022-05-29 DIAGNOSIS — Z170 Estrogen receptor positive status [ER+]: Secondary | ICD-10-CM | POA: Diagnosis not present

## 2022-05-29 DIAGNOSIS — C7951 Secondary malignant neoplasm of bone: Secondary | ICD-10-CM | POA: Diagnosis not present

## 2022-05-29 DIAGNOSIS — I10 Essential (primary) hypertension: Secondary | ICD-10-CM | POA: Diagnosis not present

## 2022-05-29 DIAGNOSIS — C787 Secondary malignant neoplasm of liver and intrahepatic bile duct: Secondary | ICD-10-CM | POA: Diagnosis not present

## 2022-05-30 ENCOUNTER — Telehealth: Payer: Self-pay | Admitting: *Deleted

## 2022-05-30 ENCOUNTER — Encounter: Payer: Self-pay | Admitting: Family Medicine

## 2022-05-30 NOTE — Chronic Care Management (AMB) (Signed)
  Care Coordination Note  05/30/2022 Name: ANNALISE MCDIARMID MRN: [id number] DOB: Jan 06, 1956  CLYDE UPSHAW is a 65 y.o. year old female who is a primary care patient of Luetta Nutting, DO and is actively engaged with the care management team. I reached out to Janalee Dane by phone today to assist with re-scheduling a follow up visit with the Licensed Clinical Social Worker  Follow up plan: Patient declines further follow up and engagement by the care management team. Appropriate care team members and provider have been notified via electronic communication.   [place]  Direct Dial: [phone]

## 2022-05-30 NOTE — Chronic Care Management (AMB) (Signed)
  Care Coordination Note  05/30/2022 Name: [name] MRN: [id number] DOB: 1955-11-09  [name] is a 66 y.o. year old female who is a primary care patient of Luetta Nutting, DO and is actively engaged with the care management team. I reached out to Janalee Dane by phone today to assist with re-scheduling a follow up visit with the Licensed Clinical Social Worker  Follow up plan: Unsuccessful telephone outreach attempt made. A HIPAA compliant phone message was left for the patient providing contact information and requesting a return call.  We have been unable to make contact with the patient for follow up. The care management team is available to follow up with the patient after provider conversation with the patient regarding recommendation for care management engagement and subsequent re-referral to the care management team.    [place]  Direct Dial: [phone]

## 2022-05-31 ENCOUNTER — Encounter: Payer: Self-pay | Admitting: Family Medicine

## 2022-06-01 ENCOUNTER — Encounter: Payer: Self-pay | Admitting: Family Medicine

## 2022-06-02 ENCOUNTER — Encounter: Payer: Self-pay | Admitting: Family Medicine

## 2022-06-03 ENCOUNTER — Encounter: Payer: Self-pay | Admitting: Family Medicine

## 2022-06-04 ENCOUNTER — Encounter: Payer: Self-pay | Admitting: Family Medicine

## 2022-06-05 ENCOUNTER — Encounter: Payer: Self-pay | Admitting: Family Medicine

## 2022-06-06 DIAGNOSIS — Z882 Allergy status to sulfonamides status: Secondary | ICD-10-CM | POA: Diagnosis not present

## 2022-06-06 DIAGNOSIS — Z881 Allergy status to other antibiotic agents status: Secondary | ICD-10-CM | POA: Diagnosis not present

## 2022-06-06 DIAGNOSIS — Z888 Allergy status to other drugs, medicaments and biological substances status: Secondary | ICD-10-CM | POA: Diagnosis not present

## 2022-06-06 DIAGNOSIS — J45909 Unspecified asthma, uncomplicated: Secondary | ICD-10-CM | POA: Diagnosis not present

## 2022-06-06 DIAGNOSIS — K8020 Calculus of gallbladder without cholecystitis without obstruction: Secondary | ICD-10-CM | POA: Diagnosis not present

## 2022-06-06 DIAGNOSIS — R1011 Right upper quadrant pain: Secondary | ICD-10-CM | POA: Diagnosis not present

## 2022-06-06 DIAGNOSIS — I10 Essential (primary) hypertension: Secondary | ICD-10-CM | POA: Diagnosis not present

## 2022-06-06 DIAGNOSIS — Z7984 Long term (current) use of oral hypoglycemic drugs: Secondary | ICD-10-CM | POA: Diagnosis not present

## 2022-06-06 DIAGNOSIS — Z853 Personal history of malignant neoplasm of breast: Secondary | ICD-10-CM | POA: Diagnosis not present

## 2022-06-06 DIAGNOSIS — Z880 Allergy status to penicillin: Secondary | ICD-10-CM | POA: Diagnosis not present

## 2022-06-08 DIAGNOSIS — Z170 Estrogen receptor positive status [ER+]: Secondary | ICD-10-CM | POA: Diagnosis not present

## 2022-06-08 DIAGNOSIS — C787 Secondary malignant neoplasm of liver and intrahepatic bile duct: Secondary | ICD-10-CM | POA: Diagnosis not present

## 2022-06-08 DIAGNOSIS — Z5111 Encounter for antineoplastic chemotherapy: Secondary | ICD-10-CM | POA: Diagnosis not present

## 2022-06-08 DIAGNOSIS — C50811 Malignant neoplasm of overlapping sites of right female breast: Secondary | ICD-10-CM | POA: Diagnosis not present

## 2022-06-08 DIAGNOSIS — R7989 Other specified abnormal findings of blood chemistry: Secondary | ICD-10-CM | POA: Diagnosis not present

## 2022-06-08 DIAGNOSIS — C7951 Secondary malignant neoplasm of bone: Secondary | ICD-10-CM | POA: Diagnosis not present

## 2022-06-08 DIAGNOSIS — R739 Hyperglycemia, unspecified: Secondary | ICD-10-CM | POA: Diagnosis not present

## 2022-06-12 ENCOUNTER — Ambulatory Visit: Payer: Medicare Other | Admitting: Family Medicine

## 2022-06-13 ENCOUNTER — Encounter: Payer: Self-pay | Admitting: Family Medicine

## 2022-06-14 ENCOUNTER — Encounter: Payer: Self-pay | Admitting: Family Medicine

## 2022-06-15 DIAGNOSIS — C50811 Malignant neoplasm of overlapping sites of right female breast: Secondary | ICD-10-CM | POA: Diagnosis not present

## 2022-06-15 DIAGNOSIS — Z170 Estrogen receptor positive status [ER+]: Secondary | ICD-10-CM | POA: Diagnosis not present

## 2022-06-19 ENCOUNTER — Encounter: Payer: Self-pay | Admitting: Family Medicine

## 2022-06-19 MED ORDER — METFORMIN HCL ER 500 MG PO TB24: 1000.0000 mg | ORAL_TABLET | Freq: Every day | ORAL | 3 refills | Status: DC

## 2022-06-19 MED ORDER — METFORMIN HCL ER 500 MG PO TB24: ORAL_TABLET | ORAL | 3 refills | Status: DC

## 2022-06-21 ENCOUNTER — Encounter: Payer: Self-pay | Admitting: Family Medicine

## 2022-06-21 DIAGNOSIS — Z170 Estrogen receptor positive status [ER+]: Secondary | ICD-10-CM | POA: Diagnosis not present

## 2022-06-21 DIAGNOSIS — C50811 Malignant neoplasm of overlapping sites of right female breast: Secondary | ICD-10-CM | POA: Diagnosis not present

## 2022-06-23 ENCOUNTER — Encounter: Payer: Self-pay | Admitting: Family Medicine

## 2022-06-26 ENCOUNTER — Encounter: Payer: Self-pay | Admitting: Family Medicine

## 2022-06-27 DIAGNOSIS — H903 Sensorineural hearing loss, bilateral: Secondary | ICD-10-CM | POA: Diagnosis not present

## 2022-06-27 DIAGNOSIS — H73891 Other specified disorders of tympanic membrane, right ear: Secondary | ICD-10-CM | POA: Diagnosis not present

## 2022-06-28 ENCOUNTER — Encounter: Payer: Self-pay | Admitting: Family Medicine

## 2022-06-29 ENCOUNTER — Encounter: Payer: Self-pay | Admitting: Family Medicine

## 2022-06-29 DIAGNOSIS — C787 Secondary malignant neoplasm of liver and intrahepatic bile duct: Secondary | ICD-10-CM | POA: Diagnosis not present

## 2022-06-29 DIAGNOSIS — C50811 Malignant neoplasm of overlapping sites of right female breast: Secondary | ICD-10-CM | POA: Diagnosis not present

## 2022-06-29 DIAGNOSIS — Z170 Estrogen receptor positive status [ER+]: Secondary | ICD-10-CM | POA: Diagnosis not present

## 2022-07-06 ENCOUNTER — Encounter: Payer: Self-pay | Admitting: Family Medicine

## 2022-07-06 DIAGNOSIS — C787 Secondary malignant neoplasm of liver and intrahepatic bile duct: Secondary | ICD-10-CM | POA: Diagnosis not present

## 2022-07-06 DIAGNOSIS — R739 Hyperglycemia, unspecified: Secondary | ICD-10-CM | POA: Diagnosis not present

## 2022-07-06 DIAGNOSIS — Z5112 Encounter for antineoplastic immunotherapy: Secondary | ICD-10-CM | POA: Diagnosis not present

## 2022-07-06 DIAGNOSIS — Z170 Estrogen receptor positive status [ER+]: Secondary | ICD-10-CM | POA: Diagnosis not present

## 2022-07-06 DIAGNOSIS — C50811 Malignant neoplasm of overlapping sites of right female breast: Secondary | ICD-10-CM | POA: Diagnosis not present

## 2022-07-06 DIAGNOSIS — C7951 Secondary malignant neoplasm of bone: Secondary | ICD-10-CM | POA: Diagnosis not present

## 2022-07-10 ENCOUNTER — Other Ambulatory Visit: Payer: Self-pay | Admitting: Family Medicine

## 2022-07-11 ENCOUNTER — Encounter: Payer: Self-pay | Admitting: Family Medicine

## 2022-07-13 DIAGNOSIS — C50811 Malignant neoplasm of overlapping sites of right female breast: Secondary | ICD-10-CM | POA: Diagnosis not present

## 2022-07-13 DIAGNOSIS — Z170 Estrogen receptor positive status [ER+]: Secondary | ICD-10-CM | POA: Diagnosis not present

## 2022-07-16 ENCOUNTER — Encounter: Payer: Self-pay | Admitting: Family Medicine

## 2022-07-17 ENCOUNTER — Encounter: Payer: Self-pay | Admitting: Family Medicine

## 2022-07-18 ENCOUNTER — Encounter: Payer: Self-pay | Admitting: Family Medicine

## 2022-07-19 ENCOUNTER — Encounter: Payer: Self-pay | Admitting: Family Medicine

## 2022-07-20 ENCOUNTER — Encounter: Payer: Self-pay | Admitting: Family Medicine

## 2022-07-20 DIAGNOSIS — C50811 Malignant neoplasm of overlapping sites of right female breast: Secondary | ICD-10-CM | POA: Diagnosis not present

## 2022-07-20 DIAGNOSIS — Z170 Estrogen receptor positive status [ER+]: Secondary | ICD-10-CM | POA: Diagnosis not present

## 2022-07-21 ENCOUNTER — Encounter: Payer: Self-pay | Admitting: Family Medicine

## 2022-07-25 ENCOUNTER — Encounter: Payer: Self-pay | Admitting: Family Medicine

## 2022-07-26 DIAGNOSIS — C50811 Malignant neoplasm of overlapping sites of right female breast: Secondary | ICD-10-CM | POA: Diagnosis not present

## 2022-07-26 DIAGNOSIS — I700 Atherosclerosis of aorta: Secondary | ICD-10-CM | POA: Diagnosis not present

## 2022-07-26 DIAGNOSIS — Z170 Estrogen receptor positive status [ER+]: Secondary | ICD-10-CM | POA: Diagnosis not present

## 2022-07-26 DIAGNOSIS — C787 Secondary malignant neoplasm of liver and intrahepatic bile duct: Secondary | ICD-10-CM | POA: Diagnosis not present

## 2022-07-26 DIAGNOSIS — C7951 Secondary malignant neoplasm of bone: Secondary | ICD-10-CM | POA: Diagnosis not present

## 2022-07-26 DIAGNOSIS — J984 Other disorders of lung: Secondary | ICD-10-CM | POA: Diagnosis not present

## 2022-07-27 DIAGNOSIS — C787 Secondary malignant neoplasm of liver and intrahepatic bile duct: Secondary | ICD-10-CM | POA: Diagnosis not present

## 2022-07-27 DIAGNOSIS — I10 Essential (primary) hypertension: Secondary | ICD-10-CM | POA: Diagnosis not present

## 2022-07-27 DIAGNOSIS — Z5111 Encounter for antineoplastic chemotherapy: Secondary | ICD-10-CM | POA: Diagnosis not present

## 2022-07-27 DIAGNOSIS — C7951 Secondary malignant neoplasm of bone: Secondary | ICD-10-CM | POA: Diagnosis not present

## 2022-07-27 DIAGNOSIS — Z9189 Other specified personal risk factors, not elsewhere classified: Secondary | ICD-10-CM | POA: Diagnosis not present

## 2022-07-27 DIAGNOSIS — R7989 Other specified abnormal findings of blood chemistry: Secondary | ICD-10-CM | POA: Diagnosis not present

## 2022-07-27 DIAGNOSIS — R739 Hyperglycemia, unspecified: Secondary | ICD-10-CM | POA: Diagnosis not present

## 2022-07-27 DIAGNOSIS — Z170 Estrogen receptor positive status [ER+]: Secondary | ICD-10-CM | POA: Diagnosis not present

## 2022-07-27 DIAGNOSIS — C50811 Malignant neoplasm of overlapping sites of right female breast: Secondary | ICD-10-CM | POA: Diagnosis not present

## 2022-07-29 ENCOUNTER — Encounter: Payer: Self-pay | Admitting: Family Medicine

## 2022-07-29 ENCOUNTER — Ambulatory Visit
Admission: EM | Admit: 2022-07-29 | Discharge: 2022-07-29 | Disposition: A | Discharge status: Home / Self Care | Payer: Medicare Other | Attending: Family Medicine | Admitting: Family Medicine

## 2022-07-29 ENCOUNTER — Other Ambulatory Visit: Payer: Self-pay

## 2022-07-29 DIAGNOSIS — R300 Dysuria: Secondary | ICD-10-CM | POA: Insufficient documentation

## 2022-07-29 DIAGNOSIS — N3001 Acute cystitis with hematuria: Secondary | ICD-10-CM | POA: Diagnosis not present

## 2022-07-29 LAB — POCT URINALYSIS DIP (MANUAL ENTRY)
Bilirubin, UA: NEGATIVE
Glucose, UA: NEGATIVE mg/dL
Nitrite, UA: NEGATIVE
Protein Ur, POC: 100 mg/dL — AB
Spec Grav, UA: 1.025 (ref 1.010–1.025)
Urobilinogen, UA: 0.2 E.U./dL
pH, UA: 5 (ref 5.0–8.0)

## 2022-07-29 MED ORDER — NITROFURANTOIN MONOHYD MACRO 100 MG PO CAPS: 100.0000 mg | ORAL_CAPSULE | Freq: Two times a day (BID) | ORAL | 0 refills | Status: AC

## 2022-07-29 NOTE — Discharge Instructions (Addendum)
Advised patient to take medication as directed with food to completion.  Encouraged patient to increase daily water intake to 64 ounces per day while taking this medication.  Advised we will follow-up with urine culture results once received.  Last if symptoms worsen and/or unresolved please follow-up with PCP or here for further evaluation.

## 2022-07-29 NOTE — ED Triage Notes (Signed)
Pt c/o dysuria x 1 month. Worsening in last 2 days.

## 2022-07-29 NOTE — ED Provider Notes (Signed)
[facility]    CSN: [id number] Arrival date & time: 07/29/22  1444      History   Chief Complaint Chief Complaint  Patient presents with   Dysuria    HPI [name] is a 66 y.o. female.   HPI Very pleasant 66 year old female presents of dysuria for 1 month worsening over the last 2 days.  PMH significant for T2DM, GAD, and HTN.  Past Medical History:  Diagnosis Date   Anxiety    Asthma    Breast cancer ([place])    Chest pain, atypical    Closed head injury 06/06/2017   Depression    Heart palpitations    hx of MVP   Hypertension    IBS (irritable bowel syndrome)    Pre-diabetes     Patient Active Problem List   Diagnosis Date Noted   History of vitamin D deficiency 05/24/2022   Type 2 diabetes mellitus with hyperglycemia ([place]) 05/24/2022   Elevated TSH 03/22/2022   Well adult exam 12/04/2021   Left lumbar radiculitis 09/28/2021   Chemotherapy induced neutropenia ([place]) 04/14/2021   Stage IV breast cancer in female [facility]) 10/17/2020   Liver metastases 10/11/2020   Bone metastases 10/10/2020   Elevated LFTs 09/06/2020   Fatigue 09/06/2020   Positive colorectal cancer screening using Cologuard test 09/05/2020   Head injury 04/22/2020   T2DM (type 2 diabetes mellitus) ([place]) 04/19/2020   Dizziness 01/01/2020   Chest discomfort 07/30/2019   Moderate episode of recurrent major depressive disorder ([place]) 08/21/2018   Malignant neoplasm of upper-outer quadrant of right breast in female, estrogen receptor positive ([place]) 07/25/2018   Positive ANA (antinuclear antibody) 04/15/2018   Elevated CK 04/15/2018   Acute stress reaction 04/15/2018   Abnormal fasting glucose 10/11/2017   Seborrheic dermatitis 07/19/2017   Allergic conjunctivitis 07/19/2017   Gastritis 06/27/2017   Palpitation 08/29/2016   Insomnia 09/28/2015   Asthma, persistent controlled 02/26/2014   Eczematous dermatitis 02/26/2014   Prediabetes 02/26/2014   Hyperglycemia 12/31/2013    Hyperlipidemia 12/31/2013   Essential hypertension 08/23/2012   GAD (generalized anxiety disorder) 05/01/2012    Past Surgical History:  Procedure Laterality Date   BREAST SURGERY  06/2018   right and left breast   eAB     x2    MASTECTOMY MODIFIED RADICAL Bilateral 10/10/2018   Procedure: RIGHT MODIFIED RADICAL MASTECTOMY AND LEFT PROPHYLATIC MASTECTOMY;  Surgeon: [name], MD;  Location: [facility];  Service: General;  Laterality: Bilateral;   PORTACATH PLACEMENT Left 08/12/2018   Procedure: INSERTION PORT-A-CATH;  Surgeon: [name], MD;  Location: [facility];  Service: General;  Laterality: Left;    OB History     Gravida  3   Para  1   Term  1   Preterm      AB  2   Living  1      SAB      IAB  2   Ectopic      Multiple      Live Births               Home Medications    Prior to Admission medications   Medication Sig Start Date End Date Taking? Authorizing Provider  nitrofurantoin, macrocrystal-monohydrate, (MACROBID) 100 MG capsule Take 1 capsule (100 mg total) by mouth 2 (two) times daily for 7 days. 07/29/22 08/05/22 Yes Eliezer Lofts, FNP  acetaminophen (TYLENOL) 500 MG chewable tablet Chew by mouth.    [provider]  albuterol (VENTOLIN HFA) 108 (90 Base) MCG/ACT inhaler Inhale 2 puffs into the lungs every 6 (six) hours as needed for wheezing or shortness of breath. 10/14/21   Luetta Nutting, DO  aspirin EC 81 MG tablet Take 81 mg by mouth 4 (four) times a week.    [provider]  B Complex-C (B-COMPLEX WITH VITAMIN C) tablet Take 1 tablet by mouth daily after lunch.    [provider]  Blood Glucose Monitoring Suppl (ACCU-CHEK GUIDE) w/Device KIT Dx DM E11.9 Check fasting blood sugar every morning and 2 hours after largest meal a few days a week. 05/12/22   Luetta Nutting, DO  budesonide-formoterol (SYMBICORT) 160-4.5 MCG/ACT inhaler Inhale 2 puffs into the lungs 2 (two) times daily. 10/14/21    Luetta Nutting, DO  Calcium-Magnesium (CAL-MAG PO) Take 1 tablet by mouth 2 (two) times daily.    [provider]  CHLOROPHYLL PO Take 1 tablet by mouth every other day.    [provider]  clobetasol (TEMOVATE) 0.05 % external solution Apply 1 Application topically 2 (two) times daily. Use on scalp.  Use for up to 2 weeks and then discontinue for 10 days.  May repeat cycle as needed. 05/24/22   Luetta Nutting, DO  clobetasol cream (TEMOVATE) 2.70 % Apply 1 Application topically 2 (two) times daily. 05/24/22   Luetta Nutting, DO  glipiZIDE (GLUCOTROL) 5 MG tablet Take 0.5 tablets (2.5 mg total) by mouth 2 (two) times daily before a meal. 07/11/22   Luetta Nutting, DO  glucose blood (ACCU-CHEK GUIDE) test strip Dx DM E11.9 Check fasting blood sugar every morning and 2 hours after largest meal a few days a week. 05/12/22   Luetta Nutting, DO  Lancets 30G MISC Dx DM E11.9 Check fasting blood sugar every morning and 2 hours after largest meal a few days a week. 05/12/22   Luetta Nutting, DO  loratadine (CLARITIN) 10 MG tablet Take 1 tablet (10 mg total) by mouth daily. Patient taking differently: Take 10 mg by mouth daily as needed for allergies. 12/28/17   Silverio Decamp, MD  metFORMIN (GLUCOPHAGE-XR) 500 MG 24 hr tablet Take 1 tablet (500 mg total) by mouth daily with breakfast AND 2 tablets (1,000 mg total) every evening. 06/19/22   Luetta Nutting, DO  PIQRAY, 300 MG DAILY DOSE, tablet Take 150 mg by mouth daily. 04/12/22   [provider]  triamcinolone cream (KENALOG) 0.1 % Apply topically 2 (two) times daily. 11/17/21   [provider]  Vitamin D-Vitamin K (D3 + K2 DOTS PO) Take 1 tablet by mouth daily.    [provider]    Family History Family History  Problem Relation Age of Onset   Heart attack Father    Lung cancer Mother    Bladder Cancer Brother     Social History Social History   Tobacco Use   Smoking status: Never   Smokeless  tobacco: Never   Tobacco comments:    nonsmoker  Vaping Use   Vaping Use: Never used  Substance Use Topics   Alcohol use: No   Drug use: No     Allergies   Penicillins, Sulfa antibiotics, and Effexor [venlafaxine]   Review of Systems Review of Systems  Genitourinary:  Positive for dysuria.  All other systems reviewed and are negative.    Physical Exam Triage Vital Signs ED Triage Vitals  Enc Vitals Group     BP 07/29/22 1525 (!) 153/79     Pulse Rate  07/29/22 1525 89     Resp 07/29/22 1525 17     Temp 07/29/22 1525 98.1 F (36.7 C)     Temp Source 07/29/22 1525 Oral     SpO2 07/29/22 1525 99 %     Weight --      Height --      Head Circumference --      Peak Flow --      Pain Score 07/29/22 1526 0     Pain Loc --      Pain Edu? --      Excl. in [place]? --    No data found.  Updated Vital Signs BP (!) 153/79 (BP Location: Left Arm)   Pulse 89   Temp 98.1 F (36.7 C) (Oral)   Resp 17   SpO2 99%    Physical Exam Vitals and nursing note reviewed.  Constitutional:      Appearance: Normal appearance. She is normal weight.  HENT:     Head: Normocephalic and atraumatic.     Mouth/Throat:     Mouth: Mucous membranes are moist.     Pharynx: Oropharynx is clear.  Eyes:     Extraocular Movements: Extraocular movements intact.     Conjunctiva/sclera: Conjunctivae normal.     Pupils: Pupils are equal, round, and reactive to light.  Cardiovascular:     Rate and Rhythm: Normal rate and regular rhythm.     Pulses: Normal pulses.     Heart sounds: Normal heart sounds. No murmur heard. Pulmonary:     Effort: Pulmonary effort is normal.     Breath sounds: Normal breath sounds. No wheezing, rhonchi or rales.  Abdominal:     Tenderness: There is no right CVA tenderness or left CVA tenderness.  Musculoskeletal:        General: Normal range of motion.     Cervical back: Normal range of motion and neck supple. No tenderness.  Lymphadenopathy:     Cervical: No  cervical adenopathy.  Skin:    General: Skin is warm and dry.  Neurological:     General: No focal deficit present.     Mental Status: She is alert and oriented to person, place, and time.      UC Treatments / Results  Labs (all labs ordered are listed, but only abnormal results are displayed) Labs Reviewed  POCT URINALYSIS DIP (MANUAL ENTRY) - Abnormal; Notable for the following components:      Result Value   Clarity, UA cloudy (*)    Ketones, POC UA trace (5) (*)    Blood, UA moderate (*)    Protein Ur, POC =100 (*)    Leukocytes, UA Large (3+) (*)    All other components within normal limits  URINE CULTURE    EKG   Radiology No results found.  Procedures Procedures (including critical care time)  Medications Ordered in UC Medications - No data to display  Initial Impression / Assessment and Plan / UC Course  I have reviewed the triage vital signs and the nursing notes.  Pertinent labs & imaging results that were available during my care of the patient were reviewed by me and considered in my medical decision making (see chart for details).     MDM: 1.  Acute cystitis with hematuria-Rx'd Macrobid, urine culture ordered. Advised patient to take medication as directed with food to completion.  Encouraged patient to increase daily water intake to 64 ounces per day while taking this medication.  Advised we will  follow-up with urine culture results once received.  Last if symptoms worsen and/or unresolved please follow-up with PCP or here for further evaluation.  Patient discharged home, hemodynamically stable. Final Clinical Impressions(s) / UC Diagnoses   Final diagnoses:  Dysuria  Acute cystitis with hematuria     Discharge Instructions      Advised patient to take medication as directed with food to completion.  Encouraged patient to increase daily water intake to 64 ounces per day while taking this medication.  Advised we will follow-up with urine culture  results once received.  Last if symptoms worsen and/or unresolved please follow-up with PCP or here for further evaluation.     ED Prescriptions     Medication Sig Dispense Auth. Provider   nitrofurantoin, macrocrystal-monohydrate, (MACROBID) 100 MG capsule Take 1 capsule (100 mg total) by mouth 2 (two) times daily for 7 days. 14 capsule Eliezer Lofts, FNP      PDMP not reviewed this encounter.   Eliezer Lofts, [place] 07/29/22 1622

## 2022-07-30 ENCOUNTER — Telehealth: Payer: Self-pay

## 2022-07-30 NOTE — Telephone Encounter (Signed)
TC to f/u after yesterday's visit to [facility]. No answer; left VM to call [phone] for problems or questions. 

## 2022-08-01 NOTE — Telephone Encounter (Signed)
Glad She went to urgent care for the UTI.  We do not typically recheck unless we have a concern for resistant infection we just see if symptoms improve then if they do then we assume the UTI has cleared but we can always get her back in with her PCP in a couple weeks to make sure that she is doing well.

## 2022-08-02 ENCOUNTER — Other Ambulatory Visit: Payer: Self-pay | Admitting: Family Medicine

## 2022-08-02 ENCOUNTER — Encounter: Payer: Self-pay | Admitting: Family Medicine

## 2022-08-02 DIAGNOSIS — I82401 Acute embolism and thrombosis of unspecified deep veins of right lower extremity: Secondary | ICD-10-CM | POA: Diagnosis not present

## 2022-08-02 DIAGNOSIS — I10 Essential (primary) hypertension: Secondary | ICD-10-CM | POA: Diagnosis not present

## 2022-08-02 DIAGNOSIS — I82441 Acute embolism and thrombosis of right tibial vein: Secondary | ICD-10-CM | POA: Diagnosis not present

## 2022-08-02 DIAGNOSIS — Z79899 Other long term (current) drug therapy: Secondary | ICD-10-CM | POA: Diagnosis not present

## 2022-08-02 DIAGNOSIS — Z882 Allergy status to sulfonamides status: Secondary | ICD-10-CM | POA: Diagnosis not present

## 2022-08-02 DIAGNOSIS — Z880 Allergy status to penicillin: Secondary | ICD-10-CM | POA: Diagnosis not present

## 2022-08-02 DIAGNOSIS — Z888 Allergy status to other drugs, medicaments and biological substances status: Secondary | ICD-10-CM | POA: Diagnosis not present

## 2022-08-02 DIAGNOSIS — Z7984 Long term (current) use of oral hypoglycemic drugs: Secondary | ICD-10-CM | POA: Diagnosis not present

## 2022-08-02 DIAGNOSIS — R2241 Localized swelling, mass and lump, right lower limb: Secondary | ICD-10-CM | POA: Diagnosis not present

## 2022-08-02 DIAGNOSIS — Z859 Personal history of malignant neoplasm, unspecified: Secondary | ICD-10-CM | POA: Diagnosis not present

## 2022-08-02 LAB — URINE CULTURE: Culture: 70000 — AB

## 2022-08-02 MED ORDER — FOSFOMYCIN TROMETHAMINE 3 G PO PACK: 3.0000 g | PACK | Freq: Once | ORAL | 0 refills | Status: AC

## 2022-08-03 ENCOUNTER — Encounter: Payer: Self-pay | Admitting: Family Medicine

## 2022-08-03 DIAGNOSIS — C50811 Malignant neoplasm of overlapping sites of right female breast: Secondary | ICD-10-CM | POA: Diagnosis not present

## 2022-08-03 DIAGNOSIS — I82411 Acute embolism and thrombosis of right femoral vein: Secondary | ICD-10-CM | POA: Insufficient documentation

## 2022-08-03 DIAGNOSIS — Z170 Estrogen receptor positive status [ER+]: Secondary | ICD-10-CM | POA: Diagnosis not present

## 2022-08-03 DIAGNOSIS — C787 Secondary malignant neoplasm of liver and intrahepatic bile duct: Secondary | ICD-10-CM | POA: Diagnosis not present

## 2022-08-03 DIAGNOSIS — M461 Sacroiliitis, not elsewhere classified: Secondary | ICD-10-CM | POA: Diagnosis not present

## 2022-08-03 DIAGNOSIS — R739 Hyperglycemia, unspecified: Secondary | ICD-10-CM | POA: Diagnosis not present

## 2022-08-03 DIAGNOSIS — E1169 Type 2 diabetes mellitus with other specified complication: Secondary | ICD-10-CM | POA: Diagnosis not present

## 2022-08-03 DIAGNOSIS — C7951 Secondary malignant neoplasm of bone: Secondary | ICD-10-CM | POA: Diagnosis not present

## 2022-08-04 ENCOUNTER — Encounter: Payer: Self-pay | Admitting: Family Medicine

## 2022-08-09 ENCOUNTER — Ambulatory Visit (INDEPENDENT_AMBULATORY_CARE_PROVIDER_SITE_OTHER): Payer: Medicare Other | Admitting: Family Medicine

## 2022-08-09 ENCOUNTER — Encounter: Payer: Self-pay | Admitting: Family Medicine

## 2022-08-09 VITALS — BP 154/71 | HR 99 | Temp 98.3°F | Ht 64.0 in | Wt 155.0 lb

## 2022-08-09 DIAGNOSIS — I82411 Acute embolism and thrombosis of right femoral vein: Secondary | ICD-10-CM | POA: Diagnosis not present

## 2022-08-09 DIAGNOSIS — E1169 Type 2 diabetes mellitus with other specified complication: Secondary | ICD-10-CM | POA: Diagnosis not present

## 2022-08-09 DIAGNOSIS — C787 Secondary malignant neoplasm of liver and intrahepatic bile duct: Secondary | ICD-10-CM | POA: Diagnosis not present

## 2022-08-09 DIAGNOSIS — R7989 Other specified abnormal findings of blood chemistry: Secondary | ICD-10-CM

## 2022-08-09 MED ORDER — APIXABAN 5 MG PO TABS: 5.0000 mg | ORAL_TABLET | Freq: Two times a day (BID) | ORAL | 0 refills | Status: DC

## 2022-08-09 MED ORDER — APIXABAN 5 MG PO TABS: ORAL_TABLET | ORAL | 3 refills | Status: DC

## 2022-08-09 NOTE — Assessment & Plan Note (Signed)
Doing well with eliquis at this time.  Continues to have swelling of her leg which she is concerned about.  Discussed with her that swelling may persist for several weeks as her body reabsorb the clot.  Recommend compression stocking as well as elevation of the leg when at rest.  Continue eliquis 5 mg twice daily.  She was provided with samples today.

## 2022-08-09 NOTE — Progress Notes (Signed)
[name] - 67 y.o. female MRN [id number]  Date of birth: Nov 05, 1955  Subjective Chief Complaint  Patient presents with   Abdominal Pain    HPI [name] is a 67 y.o. female here today for follow-up visit.  She does have a recent diagnosis of right-sided lower extremity DVT.  She was started on Eliquis.  Continues to have some swelling of the right leg.  Tolerating Eliquis well at this time.  She is concerned that her right leg continues remain swollen.  Has not really increased in size since initial diagnosis.  She denies shortness of breath.  She continues on Piqray for management of her metastatic breast cancer.  She did have recent scan showing interval enlargement of liver metastases.  LFTs have been more elevated as well.  She did meet with her oncology team recently and options were discussed with her.  She does not want to start chemotherapy.  Considerations were made to increase Piqray and her case is planning on being discussed at tumor board.  Her blood sugars have been somewhat difficult to control with Piqray.  She has had difficulty maintaining a consistent diet.  Currently taking metformin as well as glipizide one half tab in the morning and a whole tablet in the evening.  She does not want to add insulin.  She did have recent UTI that was treated initially with Macrobid however changed to fosfomycin due to Culture growing Enterococcus.  ROS:  A comprehensive ROS was completed and negative except as noted per HPI  Allergies  Allergen Reactions   Penicillins Shortness Of Breath    Reaction as a child Did it involve swelling of the face/tongue/throat, SOB, or low BP? Yes Did it involve sudden or severe rash/hives, skin peeling, or any reaction on the inside of your mouth or nose?Unknown Did you need to seek medical attention at a hospital or doctor's office? Yes When did it last happen? Childhood reaction.    If all above answers are "NO", may proceed with  cephalosporin use.    Sulfa Antibiotics Nausea Only   Effexor [Venlafaxine] Other (See Comments)    "dopey"    Past Medical History:  Diagnosis Date   Anxiety    Asthma    Breast cancer ([place])    Chest pain, atypical    Closed head injury 06/06/2017   Depression    Heart palpitations    hx of MVP   Hypertension    IBS (irritable bowel syndrome)    Pre-diabetes     Past Surgical History:  Procedure Laterality Date   BREAST SURGERY  06/2018   right and left breast   eAB     x2    MASTECTOMY MODIFIED RADICAL Bilateral 10/10/2018   Procedure: RIGHT MODIFIED RADICAL MASTECTOMY AND LEFT PROPHYLATIC MASTECTOMY;  Surgeon: [name], MD;  Location: [facility];  Service: General;  Laterality: Bilateral;   PORTACATH PLACEMENT Left 08/12/2018   Procedure: INSERTION PORT-A-CATH;  Surgeon: [name], MD;  Location: [place];  Service: General;  Laterality: Left;    Social History   Socioeconomic History   Marital status: Divorced    Spouse name: Not on file   Number of children: Not on file   Years of education: Not on file   Highest education level: Not on file  Occupational History   Not on file  Tobacco Use   Smoking status: Never   Smokeless tobacco: Never   Tobacco comments:  nonsmoker  Vaping Use   Vaping Use: Never used  Substance and Sexual Activity   Alcohol use: No   Drug use: No   Sexual activity: Not Currently    Birth control/protection: None  Other Topics Concern   Not on file  Social History Narrative   Divorced; grown son lives in [place].    [facility]- PCP   Social Determinants of Health   Financial Resource Strain: Not on file  Food Insecurity: Not on file  Transportation Needs: Not on file  Physical Activity: Not on file  Stress: Stress Concern Present (04/05/2022)   [place]    Feeling of Stress : To some extent  Social Connections: Not on file     Family History  Problem Relation Age of Onset   Heart attack Father    Lung cancer Mother    Bladder Cancer Brother     Health Maintenance  Topic Date Due   Diabetic kidney evaluation - eGFR measurement  12/31/2020   Zoster Vaccines- Shingrix (1 of 2) 08/24/2022 (Originally 11/22/1973)   OPHTHALMOLOGY EXAM  08/31/2022 (Originally 11/03/2020)   Medicare Annual Wellness (AWV)  08/31/2022 (Originally Sep 13, 1955)   Pneumonia Vaccine 8+ Years old (1 - PCV) 10/20/2022 (Originally 11/22/1960)   DEXA SCAN  10/20/2022 (Originally 11/23/2019)   INFLUENZA VACCINE  10/29/2022 (Originally 02/28/2022)   HEMOGLOBIN A1C  09/06/2022   Diabetic kidney evaluation - Urine ACR  05/25/2023   FOOT EXAM  05/25/2023   DTaP/Tdap/Td (2 - Td or Tdap) 03/28/2028   COLONOSCOPY (Pts 45-37yr Insurance coverage will need to be confirmed)  05/03/2032   Hepatitis C Screening  Completed   HPV VACCINES  Aged Out   MAMMOGRAM  Discontinued   COVID-19 Vaccine  Discontinued     ----------------------------------------------------------------------------------------------------------------------------------------------------------------------------------------------------------------- Physical Exam BP (!) 154/71 (BP Location: Left Arm, Patient Position: Sitting, Cuff Size: Normal)   Pulse 99   Temp 98.3 F (36.8 C) (Oral)   Ht '5\' 4"'$  (1.626 m)   Wt 155 lb (70.3 kg)   SpO2 99%   BMI 26.61 kg/m   Physical Exam Constitutional:      Appearance: She is well-developed.  HENT:     Head: Normocephalic and atraumatic.  Eyes:     General: No scleral icterus. Cardiovascular:     Rate and Rhythm: Normal rate and regular rhythm.  Pulmonary:     Effort: Pulmonary effort is normal.     Breath sounds: Normal breath sounds.  Musculoskeletal:     Cervical back: Neck supple.  Neurological:     Mental Status: She is alert.  Psychiatric:        Mood and Affect: Mood normal.        Behavior: Behavior normal.      ------------------------------------------------------------------------------------------------------------------------------------------------------------------------------------------------------------------- Assessment and Plan  [place] Blood sugars have been elevated.  Discussed being consistent with diet.  Continue metformin at current strength.  May increase glipizide to 1 tab twice daily if glucose remains above 250.  We did discuss that if pancreas increase in glucose increases further we may need to add insulin.  Malignant neoplasm metastatic to liver ([facility] She has interval size increase of her liver metastases.  She is awaiting additional recommendations from her oncologist regarding treatment.   Elevated LFTs Related to her liver metastases.  She does have cholelithiasis as well.  She does have some intermittent pain with fatty foods but overall has been able to control this  with diet.  Acute deep vein thrombosis (DVT) of femoral vein of right lower extremity (HCC) Doing well with eliquis at this time.  Continues to have swelling of her leg which she is concerned about.  Discussed with her that swelling may persist for several weeks as her body reabsorb the clot.  Recommend compression stocking as well as elevation of the leg when at rest.  Continue eliquis 5 mg twice daily.  She was provided with samples today.   Meds ordered this encounter  Medications   apixaban (ELIQUIS) 5 MG TABS tablet    Sig: Take 1 tablet PO BID.    Dispense:  60 tablet    Refill:  3   apixaban (ELIQUIS) 5 MG TABS tablet    Sig: Take 1 tablet (5 mg total) by mouth 2 (two) times daily.    Dispense:  56 tablet    Refill:  0    Lot: [id number] Exp:  May 2025    No follow-ups on file.    This visit occurred during the SARS-CoV-2 public health emergency.  Safety protocols were in place, including screening questions prior to the visit, additional usage of staff  PPE, and extensive cleaning of exam room while observing appropriate contact time as indicated for disinfecting solutions.

## 2022-08-09 NOTE — Patient Instructions (Signed)
We will continue the eliquis for a period of at least 6 months.  I have sent renewals of this.   Continue to work on diet for blood sugar control.    Gallbladder Eating Plan High blood cholesterol, obesity, a sedentary lifestyle, an unhealthy diet, and diabetes are risk factors for developing gallstones. If you have a gallbladder condition, you may have trouble digesting fats and tolerating high fat intake. Eating a low-fat diet can help reduce your symptoms and may be helpful before and after having surgery to remove your gallbladder (cholecystectomy). Your health care provider may recommend that you work with a dietitian to help you reduce the amount of fat in your diet. What are tips for following this plan? General guidelines Limit your fat intake to less than 30% of your total daily calories. If you eat around 1,800 calories each day, this means eating less than 60 grams (g) of fat per day. Fat is an important part of a healthy diet. Eating a low-fat diet can make it hard to maintain a healthy body weight. Ask your dietitian how much fat, calories, and other nutrients you need each day. Eat small, frequent meals throughout the day instead of three large meals. Drink at least 8-10 cups (1.9-2.4 L) of fluid a day. Drink enough fluid to keep your urine pale yellow. If you drink alcohol: Limit how much you have to: 0-1 drink a day for women who are not pregnant. 0-2 drinks a day for men. Know how much alcohol is in a drink. In the [place] one drink equals one 12 oz bottle of beer (355 mL), one 5 oz glass of wine (148 mL), or one 1 oz glass of hard liquor (44 mL). Reading food labels  Check nutrition facts on food labels for the amount of fat per serving. Choose foods with less than 3 grams of fat per serving. Shopping Choose nonfat and low-fat healthy foods. Look for the words "nonfat," "low-fat," or "fat-free." Avoid buying processed or prepackaged foods. Cooking Cook using low-fat  methods, such as baking, broiling, grilling, or boiling. Cook with small amounts of healthy fats, such as olive oil, grapeseed oil, canola oil, avocado oil, or sunflower oil. What foods are recommended?  All fresh, frozen, or canned fruits and vegetables. Whole grains. Low-fat or nonfat (skim) milk and yogurt. Lean meat, skinless poultry, fish, eggs, and beans. Low-fat protein supplement powders or drinks. Spices and herbs. The items listed above may not be a complete list of foods and beverages you can eat and drink. Contact a dietitian for more information. What foods are not recommended? High-fat foods. These include baked goods, fast food, fatty cuts of meat, ice cream, french toast, sweet rolls, pizza, cheese bread, foods covered with butter, creamy sauces, or cheese. Fried foods. These include french fries, tempura, battered fish, breaded chicken, fried breads, and sweets. Foods that cause bloating and gas. The items listed above may not be a complete list of foods that you should avoid. Contact a dietitian for more information. Summary A low-fat diet can be helpful if you have a gallbladder condition, or before and after gallbladder surgery. Limit your fat intake to less than 30% of your total daily calories. This is about 60 g of fat if you eat 1,800 calories each day. Eat small, frequent meals throughout the day instead of three large meals. This information is not intended to replace advice given to you by your health care provider. Make sure you discuss any questions you have  with your health care provider. Document Revised: 07/01/2021 Document Reviewed: 07/01/2021 Elsevier Patient Education  [place].

## 2022-08-09 NOTE — Assessment & Plan Note (Signed)
Related to her liver metastases.  She does have cholelithiasis as well.  She does have some intermittent pain with fatty foods but overall has been able to control this with diet.

## 2022-08-09 NOTE — Assessment & Plan Note (Signed)
Blood sugars have been elevated.  Discussed being consistent with diet.  Continue metformin at current strength.  May increase glipizide to 1 tab twice daily if glucose remains above 250.  We did discuss that if pancreas increase in glucose increases further we may need to add insulin.

## 2022-08-09 NOTE — Assessment & Plan Note (Signed)
She has interval size increase of her liver metastases.  She is awaiting additional recommendations from her oncologist regarding treatment.

## 2022-08-10 ENCOUNTER — Encounter: Payer: Self-pay | Admitting: Family Medicine

## 2022-08-10 DIAGNOSIS — C50811 Malignant neoplasm of overlapping sites of right female breast: Secondary | ICD-10-CM | POA: Diagnosis not present

## 2022-08-10 DIAGNOSIS — Z170 Estrogen receptor positive status [ER+]: Secondary | ICD-10-CM | POA: Diagnosis not present

## 2022-08-10 DIAGNOSIS — I82411 Acute embolism and thrombosis of right femoral vein: Secondary | ICD-10-CM | POA: Diagnosis not present

## 2022-08-10 DIAGNOSIS — C7951 Secondary malignant neoplasm of bone: Secondary | ICD-10-CM | POA: Diagnosis not present

## 2022-08-10 DIAGNOSIS — C787 Secondary malignant neoplasm of liver and intrahepatic bile duct: Secondary | ICD-10-CM | POA: Diagnosis not present

## 2022-08-11 ENCOUNTER — Encounter: Payer: Self-pay | Admitting: Family Medicine

## 2022-08-11 MED ORDER — JOBST 20-30MMHG COMPRESSION SM MISC: 0 refills | Status: DC

## 2022-08-12 ENCOUNTER — Encounter: Payer: Self-pay | Admitting: Family Medicine

## 2022-08-12 DIAGNOSIS — R1011 Right upper quadrant pain: Secondary | ICD-10-CM

## 2022-08-13 ENCOUNTER — Encounter: Payer: Self-pay | Admitting: Family Medicine

## 2022-08-14 ENCOUNTER — Encounter: Payer: Self-pay | Admitting: Family Medicine

## 2022-08-15 ENCOUNTER — Ambulatory Visit: Payer: Medicare Other | Admitting: Family Medicine

## 2022-08-15 ENCOUNTER — Ambulatory Visit (INDEPENDENT_AMBULATORY_CARE_PROVIDER_SITE_OTHER): Payer: Medicare Other

## 2022-08-15 ENCOUNTER — Encounter: Payer: Self-pay | Admitting: Family Medicine

## 2022-08-15 ENCOUNTER — Encounter (INDEPENDENT_AMBULATORY_CARE_PROVIDER_SITE_OTHER): Payer: Medicare Other | Admitting: Family Medicine

## 2022-08-15 DIAGNOSIS — R1011 Right upper quadrant pain: Secondary | ICD-10-CM

## 2022-08-15 NOTE — Telephone Encounter (Signed)
Please see the MyChart message reply(ies) for my assessment and plan.    This patient gave consent for this Medical Advice Message and is aware that it may result in a bill to [employer], as well as the possibility of receiving a bill for a co-payment or deductible. They are an established patient, but are not seeking medical advice exclusively about a problem treated during an in person or video visit in the last seven days. I did not recommend an in person or video visit within seven days of my reply.    I spent a total of 15 minutes cumulative time within 7 days through [employer].  Additionally, see MyChart messages from 1/12, 1/13 and 1/14.  Luetta Nutting, DO

## 2022-08-16 ENCOUNTER — Encounter: Payer: Self-pay | Admitting: Family Medicine

## 2022-08-16 DIAGNOSIS — R109 Unspecified abdominal pain: Secondary | ICD-10-CM | POA: Diagnosis not present

## 2022-08-17 ENCOUNTER — Encounter: Payer: Self-pay | Admitting: Family Medicine

## 2022-08-17 DIAGNOSIS — C787 Secondary malignant neoplasm of liver and intrahepatic bile duct: Secondary | ICD-10-CM | POA: Diagnosis not present

## 2022-08-17 DIAGNOSIS — Z170 Estrogen receptor positive status [ER+]: Secondary | ICD-10-CM | POA: Diagnosis not present

## 2022-08-17 DIAGNOSIS — C7951 Secondary malignant neoplasm of bone: Secondary | ICD-10-CM | POA: Diagnosis not present

## 2022-08-17 DIAGNOSIS — Z79899 Other long term (current) drug therapy: Secondary | ICD-10-CM | POA: Diagnosis not present

## 2022-08-17 DIAGNOSIS — Z5112 Encounter for antineoplastic immunotherapy: Secondary | ICD-10-CM | POA: Diagnosis not present

## 2022-08-17 DIAGNOSIS — C50811 Malignant neoplasm of overlapping sites of right female breast: Secondary | ICD-10-CM | POA: Diagnosis not present

## 2022-08-18 DIAGNOSIS — K820 Obstruction of gallbladder: Secondary | ICD-10-CM | POA: Diagnosis not present

## 2022-08-18 DIAGNOSIS — R1031 Right lower quadrant pain: Secondary | ICD-10-CM | POA: Diagnosis not present

## 2022-08-18 DIAGNOSIS — C787 Secondary malignant neoplasm of liver and intrahepatic bile duct: Secondary | ICD-10-CM | POA: Diagnosis not present

## 2022-08-18 DIAGNOSIS — C801 Malignant (primary) neoplasm, unspecified: Secondary | ICD-10-CM | POA: Diagnosis not present

## 2022-08-18 DIAGNOSIS — C7951 Secondary malignant neoplasm of bone: Secondary | ICD-10-CM | POA: Diagnosis not present

## 2022-08-19 ENCOUNTER — Encounter: Payer: Self-pay | Admitting: Family Medicine

## 2022-08-21 ENCOUNTER — Encounter: Payer: Self-pay | Admitting: Family Medicine

## 2022-08-21 ENCOUNTER — Other Ambulatory Visit: Payer: Self-pay

## 2022-08-21 ENCOUNTER — Encounter (INDEPENDENT_AMBULATORY_CARE_PROVIDER_SITE_OTHER): Payer: Medicare Other | Admitting: Family Medicine

## 2022-08-21 DIAGNOSIS — I82411 Acute embolism and thrombosis of right femoral vein: Secondary | ICD-10-CM | POA: Diagnosis not present

## 2022-08-21 DIAGNOSIS — E1169 Type 2 diabetes mellitus with other specified complication: Secondary | ICD-10-CM

## 2022-08-21 DIAGNOSIS — M79604 Pain in right leg: Secondary | ICD-10-CM

## 2022-08-21 DIAGNOSIS — C787 Secondary malignant neoplasm of liver and intrahepatic bile duct: Secondary | ICD-10-CM | POA: Diagnosis not present

## 2022-08-21 MED ORDER — LANCETS 30G MISC: 99 refills | Status: DC

## 2022-08-21 NOTE — Telephone Encounter (Signed)
Please see the MyChart message reply(ies) for my assessment and plan.    This patient gave consent for this Medical Advice Message and is aware that it may result in a bill to [employer], as well as the possibility of receiving a bill for a co-payment or deductible. They are an established patient, but are not seeking medical advice exclusively about a problem treated during an in person or video visit in the last seven days. I did not recommend an in person or video visit within seven days of my reply.    I spent a total of 15 minutes cumulative time within 7 days through [employer].  Luetta Nutting, DO

## 2022-08-22 ENCOUNTER — Encounter: Payer: Self-pay | Admitting: Family Medicine

## 2022-08-22 NOTE — Addendum Note (Signed)
Addended by: Perlie Mayo on: 08/22/2022 05:08 PM   Modules accepted: Orders

## 2022-08-23 ENCOUNTER — Encounter: Payer: Self-pay | Admitting: Family Medicine

## 2022-08-27 ENCOUNTER — Encounter: Payer: Self-pay | Admitting: Family Medicine

## 2022-08-30 ENCOUNTER — Encounter: Payer: Self-pay | Admitting: Family Medicine

## 2022-08-30 ENCOUNTER — Ambulatory Visit (INDEPENDENT_AMBULATORY_CARE_PROVIDER_SITE_OTHER): Payer: Medicare Other | Admitting: Family Medicine

## 2022-08-30 VITALS — BP 131/68 | HR 96 | Ht 64.0 in | Wt 151.0 lb

## 2022-08-30 DIAGNOSIS — I82411 Acute embolism and thrombosis of right femoral vein: Secondary | ICD-10-CM | POA: Diagnosis not present

## 2022-08-30 DIAGNOSIS — R051 Acute cough: Secondary | ICD-10-CM

## 2022-08-30 DIAGNOSIS — E1165 Type 2 diabetes mellitus with hyperglycemia: Secondary | ICD-10-CM

## 2022-08-30 DIAGNOSIS — C787 Secondary malignant neoplasm of liver and intrahepatic bile duct: Secondary | ICD-10-CM | POA: Diagnosis not present

## 2022-08-30 DIAGNOSIS — R059 Cough, unspecified: Secondary | ICD-10-CM | POA: Insufficient documentation

## 2022-08-30 NOTE — Progress Notes (Signed)
[name] - 67 y.o. female MRN [id number]  Date of birth: October 16, 1955  Subjective Chief Complaint  Patient presents with   Cough    HPI [name] is a 67 year old female here today for follow-up visit.  She has questions about swelling in her leg.  Recent DVT.  Swelling has improved and she is using compression socks fairly regularly.  She does try to keep legs elevated.  Continues on eliquis.  She denies any increasing pain.  She continues treatment for her metastatic breast cancer.  Currently on Truqap.  Her strength this was recently decreased that she was having side effects.  Blood sugars have been elevated due to medication.  She is taking metformin daily with glimepiride on days that she is taking Truqap.  She has found it somewhat difficult to remain consistent with her diet.  She has had a dry cough for about a week.  Mild congestion and postnasal drainage.  Denies fever, chills, wheezing or shortness of breath.  ROS:  A comprehensive ROS was completed and negative except as noted per HPI    Allergies  Allergen Reactions   Penicillins Shortness Of Breath    Reaction as a child Did it involve swelling of the face/tongue/throat, SOB, or low BP? Yes Did it involve sudden or severe rash/hives, skin peeling, or any reaction on the inside of your mouth or nose?Unknown Did you need to seek medical attention at a hospital or doctor's office? Yes When did it last happen? Childhood reaction.    If all above answers are "NO", may proceed with cephalosporin use.    Sulfa Antibiotics Nausea Only   Effexor [Venlafaxine] Other (See Comments)    "dopey"    Past Medical History:  Diagnosis Date   Anxiety    Asthma    Breast cancer ([place])    Chest pain, atypical    Closed head injury 06/06/2017   Depression    Heart palpitations    hx of MVP   Hypertension    IBS (irritable bowel syndrome)    Pre-diabetes     Past Surgical History:  Procedure Laterality Date   BREAST SURGERY   06/2018   right and left breast   eAB     x2    MASTECTOMY MODIFIED RADICAL Bilateral 10/10/2018   Procedure: RIGHT MODIFIED RADICAL MASTECTOMY AND LEFT PROPHYLATIC MASTECTOMY;  Surgeon: [name], MD;  Location: [facility];  Service: General;  Laterality: Bilateral;   PORTACATH PLACEMENT Left 08/12/2018   Procedure: INSERTION PORT-A-CATH;  Surgeon: [name], MD;  Location: [place];  Service: General;  Laterality: Left;    Social History   Socioeconomic History   Marital status: Divorced    Spouse name: Not on file   Number of children: Not on file   Years of education: Not on file   Highest education level: Not on file  Occupational History   Not on file  Tobacco Use   Smoking status: Never   Smokeless tobacco: Never   Tobacco comments:    nonsmoker  Vaping Use   Vaping Use: Never used  Substance and Sexual Activity   Alcohol use: No   Drug use: No   Sexual activity: Not Currently    Birth control/protection: None  Other Topics Concern   Not on file  Social History Narrative   Divorced; grown son lives in [place].    [facility]- PCP   Social Determinants of Health   Financial Resource Strain: Not  on file  Food Insecurity: Not on file  Transportation Needs: Not on file  Physical Activity: Not on file  Stress: Stress Concern Present (04/05/2022)   [place]    Feeling of Stress : To some extent  Social Connections: Not on file    Family History  Problem Relation Age of Onset   Heart attack Father    Lung cancer Mother    Bladder Cancer Brother     Health Maintenance  Topic Date Due   Diabetic kidney evaluation - eGFR measurement  12/31/2020   OPHTHALMOLOGY EXAM  08/31/2022 (Originally 11/03/2020)   Medicare Annual Wellness (AWV)  08/31/2022 (Originally February 20, 1956)   Pneumonia Vaccine 58+ Years old (1 - PCV) 10/20/2022 (Originally 11/22/1960)   DEXA SCAN  10/20/2022  (Originally 11/23/2019)   INFLUENZA VACCINE  10/29/2022 (Originally 02/28/2022)   Zoster Vaccines- Shingrix (1 of 2) 11/28/2022 (Originally 11/22/1973)   HEMOGLOBIN A1C  09/06/2022   Diabetic kidney evaluation - Urine ACR  05/25/2023   FOOT EXAM  05/25/2023   DTaP/Tdap/Td (2 - Td or Tdap) 03/28/2028   COLONOSCOPY (Pts 45-23yr Insurance coverage will need to be confirmed)  05/03/2032   Hepatitis C Screening  Completed   HPV VACCINES  Aged Out   MAMMOGRAM  Discontinued   COVID-19 Vaccine  Discontinued     ----------------------------------------------------------------------------------------------------------------------------------------------------------------------------------------------------------------- Physical Exam BP 131/68 (BP Location: Left Arm, Patient Position: Sitting, Cuff Size: Normal)   Pulse 96   Ht '5\' 4"'$  (1.626 m)   Wt 151 lb (68.5 kg)   SpO2 100%   BMI 25.92 kg/m   Physical Exam Constitutional:      Appearance: Normal appearance.  Eyes:     General: No scleral icterus. Cardiovascular:     Rate and Rhythm: Normal rate and regular rhythm.  Pulmonary:     Effort: Pulmonary effort is normal.     Breath sounds: Normal breath sounds.  Musculoskeletal:     Cervical back: Neck supple.  Neurological:     Mental Status: She is alert.  Psychiatric:        Mood and Affect: Mood normal.        Behavior: Behavior normal.     ------------------------------------------------------------------------------------------------------------------------------------------------------------------------------------------------------------------- Assessment and Plan  Acute deep vein thrombosis (DVT) of femoral vein of right lower extremity ([place] She continues on Eliquis for management of DVT.  Leg swelling has improved.  She denies pain at this time.  I do not think she needs a vascular surgery consultation at this time.  Encouraged to continue compression  stockings.  Malignant neoplasm metastatic to liver ([facility] Recent CT scan with a little bit of free fluid around the liver, likely related to liver inflammation.  Management of antineoplastic drugs per oncology.  Type 2 diabetes mellitus with hyperglycemia (HCC) She will continue strategy of daily metformin with glipizide on days that she is taking truqap  Cough Recommend supportive care with home humidifier, nasal saline rinses and over-the-counter cough syrup or lozenges.  Contact clinic or return for reexamination if symptoms worsen.   No orders of the defined types were placed in this encounter.   No follow-ups on file.    This visit occurred during the SARS-CoV-2 public health emergency.  Safety protocols were in place, including screening questions prior to the visit, additional usage of staff PPE, and extensive cleaning of exam room while observing appropriate contact time as indicated for disinfecting solutions.

## 2022-08-30 NOTE — Assessment & Plan Note (Signed)
She will continue strategy of daily metformin with glipizide on days that she is taking truqap

## 2022-08-30 NOTE — Assessment & Plan Note (Signed)
She continues on Eliquis for management of DVT.  Leg swelling has improved.  She denies pain at this time.  I do not think she needs a vascular surgery consultation at this time.  Encouraged to continue compression stockings.

## 2022-08-30 NOTE — Assessment & Plan Note (Signed)
Recommend supportive care with home humidifier, nasal saline rinses and over-the-counter cough syrup or lozenges.  Contact clinic or return for reexamination if symptoms worsen.

## 2022-08-30 NOTE — Patient Instructions (Signed)
Try nasal saline rinses.  Stay well hydrated.  Add humidifier at home.

## 2022-08-30 NOTE — Assessment & Plan Note (Signed)
Recent CT scan with a little bit of free fluid around the liver, likely related to liver inflammation.  Management of antineoplastic drugs per oncology.

## 2022-08-31 DIAGNOSIS — Z5111 Encounter for antineoplastic chemotherapy: Secondary | ICD-10-CM | POA: Diagnosis not present

## 2022-08-31 DIAGNOSIS — R739 Hyperglycemia, unspecified: Secondary | ICD-10-CM | POA: Diagnosis not present

## 2022-08-31 DIAGNOSIS — C50811 Malignant neoplasm of overlapping sites of right female breast: Secondary | ICD-10-CM | POA: Diagnosis not present

## 2022-08-31 DIAGNOSIS — Z170 Estrogen receptor positive status [ER+]: Secondary | ICD-10-CM | POA: Diagnosis not present

## 2022-08-31 DIAGNOSIS — C7951 Secondary malignant neoplasm of bone: Secondary | ICD-10-CM | POA: Diagnosis not present

## 2022-08-31 DIAGNOSIS — I82411 Acute embolism and thrombosis of right femoral vein: Secondary | ICD-10-CM | POA: Diagnosis not present

## 2022-08-31 DIAGNOSIS — C787 Secondary malignant neoplasm of liver and intrahepatic bile duct: Secondary | ICD-10-CM | POA: Diagnosis not present

## 2022-09-04 ENCOUNTER — Encounter: Payer: Self-pay | Admitting: Family Medicine

## 2022-09-04 NOTE — Telephone Encounter (Signed)
Should have refills on strips and lancets.    CM

## 2022-09-05 DIAGNOSIS — C787 Secondary malignant neoplasm of liver and intrahepatic bile duct: Secondary | ICD-10-CM | POA: Diagnosis not present

## 2022-09-06 ENCOUNTER — Encounter: Payer: Self-pay | Admitting: Family Medicine

## 2022-09-06 DIAGNOSIS — I82411 Acute embolism and thrombosis of right femoral vein: Secondary | ICD-10-CM | POA: Diagnosis not present

## 2022-09-06 DIAGNOSIS — M7989 Other specified soft tissue disorders: Secondary | ICD-10-CM | POA: Diagnosis not present

## 2022-09-07 ENCOUNTER — Encounter: Payer: Self-pay | Admitting: Family Medicine

## 2022-09-07 DIAGNOSIS — C50811 Malignant neoplasm of overlapping sites of right female breast: Secondary | ICD-10-CM | POA: Diagnosis not present

## 2022-09-07 DIAGNOSIS — Z170 Estrogen receptor positive status [ER+]: Secondary | ICD-10-CM | POA: Diagnosis not present

## 2022-09-07 DIAGNOSIS — C787 Secondary malignant neoplasm of liver and intrahepatic bile duct: Secondary | ICD-10-CM | POA: Diagnosis not present

## 2022-09-13 ENCOUNTER — Encounter: Payer: Self-pay | Admitting: Family Medicine

## 2022-09-14 ENCOUNTER — Encounter: Payer: Self-pay | Admitting: Family Medicine

## 2022-09-14 DIAGNOSIS — Z170 Estrogen receptor positive status [ER+]: Secondary | ICD-10-CM | POA: Diagnosis not present

## 2022-09-14 DIAGNOSIS — C50811 Malignant neoplasm of overlapping sites of right female breast: Secondary | ICD-10-CM | POA: Diagnosis not present

## 2022-09-14 DIAGNOSIS — R1011 Right upper quadrant pain: Secondary | ICD-10-CM | POA: Diagnosis not present

## 2022-09-15 ENCOUNTER — Encounter (INDEPENDENT_AMBULATORY_CARE_PROVIDER_SITE_OTHER): Payer: Medicare Other | Admitting: Family Medicine

## 2022-09-15 DIAGNOSIS — R1011 Right upper quadrant pain: Secondary | ICD-10-CM | POA: Diagnosis not present

## 2022-09-15 NOTE — Telephone Encounter (Signed)
Please see the MyChart message reply(ies) for my assessment and plan.    This patient gave consent for this Medical Advice Message and is aware that it may result in a bill to their insurance company, as well as the possibility of receiving a bill for a co-payment or deductible. They are an established patient, but are not seeking medical advice exclusively about a problem treated during an in person or video visit in the last seven days. I did not recommend an in person or video visit within seven days of my reply.    I spent a total of 7 minutes cumulative time within 7 days through MyChart messaging.  Lena Gores, DO   

## 2022-09-18 DIAGNOSIS — K8050 Calculus of bile duct without cholangitis or cholecystitis without obstruction: Secondary | ICD-10-CM | POA: Diagnosis not present

## 2022-09-20 ENCOUNTER — Encounter: Payer: Self-pay | Admitting: Family Medicine

## 2022-09-21 ENCOUNTER — Encounter: Payer: Self-pay | Admitting: Family Medicine

## 2022-09-21 DIAGNOSIS — I890 Lymphedema, not elsewhere classified: Secondary | ICD-10-CM | POA: Diagnosis not present

## 2022-09-21 DIAGNOSIS — C50811 Malignant neoplasm of overlapping sites of right female breast: Secondary | ICD-10-CM | POA: Diagnosis not present

## 2022-09-21 DIAGNOSIS — Z170 Estrogen receptor positive status [ER+]: Secondary | ICD-10-CM | POA: Diagnosis not present

## 2022-09-21 DIAGNOSIS — C7951 Secondary malignant neoplasm of bone: Secondary | ICD-10-CM | POA: Diagnosis not present

## 2022-09-21 DIAGNOSIS — C787 Secondary malignant neoplasm of liver and intrahepatic bile duct: Secondary | ICD-10-CM | POA: Diagnosis not present

## 2022-09-22 ENCOUNTER — Encounter: Payer: Self-pay | Admitting: Family Medicine

## 2022-09-23 DIAGNOSIS — E1165 Type 2 diabetes mellitus with hyperglycemia: Secondary | ICD-10-CM | POA: Diagnosis not present

## 2022-09-23 DIAGNOSIS — R600 Localized edema: Secondary | ICD-10-CM | POA: Diagnosis not present

## 2022-09-23 DIAGNOSIS — Z86718 Personal history of other venous thrombosis and embolism: Secondary | ICD-10-CM | POA: Diagnosis not present

## 2022-09-23 DIAGNOSIS — Z7901 Long term (current) use of anticoagulants: Secondary | ICD-10-CM | POA: Diagnosis not present

## 2022-09-23 DIAGNOSIS — C7951 Secondary malignant neoplasm of bone: Secondary | ICD-10-CM | POA: Diagnosis not present

## 2022-09-23 DIAGNOSIS — R059 Cough, unspecified: Secondary | ICD-10-CM | POA: Diagnosis not present

## 2022-09-23 DIAGNOSIS — J4530 Mild persistent asthma, uncomplicated: Secondary | ICD-10-CM | POA: Diagnosis not present

## 2022-09-23 DIAGNOSIS — C801 Malignant (primary) neoplasm, unspecified: Secondary | ICD-10-CM | POA: Diagnosis not present

## 2022-09-23 DIAGNOSIS — Z7984 Long term (current) use of oral hypoglycemic drugs: Secondary | ICD-10-CM | POA: Diagnosis not present

## 2022-09-23 DIAGNOSIS — R531 Weakness: Secondary | ICD-10-CM | POA: Diagnosis not present

## 2022-09-23 DIAGNOSIS — R7989 Other specified abnormal findings of blood chemistry: Secondary | ICD-10-CM | POA: Diagnosis not present

## 2022-09-23 DIAGNOSIS — C787 Secondary malignant neoplasm of liver and intrahepatic bile duct: Secondary | ICD-10-CM | POA: Diagnosis not present

## 2022-09-23 DIAGNOSIS — R509 Fever, unspecified: Secondary | ICD-10-CM | POA: Diagnosis not present

## 2022-09-23 DIAGNOSIS — C50811 Malignant neoplasm of overlapping sites of right female breast: Secondary | ICD-10-CM | POA: Diagnosis not present

## 2022-09-23 DIAGNOSIS — I959 Hypotension, unspecified: Secondary | ICD-10-CM | POA: Diagnosis not present

## 2022-09-23 DIAGNOSIS — Z79899 Other long term (current) drug therapy: Secondary | ICD-10-CM | POA: Diagnosis not present

## 2022-09-23 DIAGNOSIS — A419 Sepsis, unspecified organism: Secondary | ICD-10-CM | POA: Diagnosis not present

## 2022-09-24 ENCOUNTER — Encounter: Payer: Self-pay | Admitting: Family Medicine

## 2022-09-24 DIAGNOSIS — R7989 Other specified abnormal findings of blood chemistry: Secondary | ICD-10-CM | POA: Diagnosis not present

## 2022-09-24 DIAGNOSIS — E860 Dehydration: Secondary | ICD-10-CM | POA: Diagnosis not present

## 2022-09-24 DIAGNOSIS — J45998 Other asthma: Secondary | ICD-10-CM | POA: Diagnosis not present

## 2022-09-24 DIAGNOSIS — C50919 Malignant neoplasm of unspecified site of unspecified female breast: Secondary | ICD-10-CM | POA: Diagnosis not present

## 2022-09-24 DIAGNOSIS — K819 Cholecystitis, unspecified: Secondary | ICD-10-CM | POA: Diagnosis not present

## 2022-09-24 DIAGNOSIS — E1165 Type 2 diabetes mellitus with hyperglycemia: Secondary | ICD-10-CM | POA: Diagnosis not present

## 2022-09-24 DIAGNOSIS — C787 Secondary malignant neoplasm of liver and intrahepatic bile duct: Secondary | ICD-10-CM | POA: Diagnosis not present

## 2022-09-24 DIAGNOSIS — C7951 Secondary malignant neoplasm of bone: Secondary | ICD-10-CM | POA: Diagnosis not present

## 2022-09-24 DIAGNOSIS — A419 Sepsis, unspecified organism: Secondary | ICD-10-CM | POA: Diagnosis not present

## 2022-09-25 ENCOUNTER — Telehealth: Payer: Self-pay | Admitting: Family Medicine

## 2022-09-25 ENCOUNTER — Telehealth: Payer: Self-pay | Admitting: Cardiovascular Disease

## 2022-09-25 NOTE — Telephone Encounter (Signed)
Called patient back about her message. Patient was at [facility] over the weekend and wanted Dr. Kyla Balzarine thoughts on her test that were cardiac related. Patient stated she is seeing her PCP tomorrow. Encouraged patient to discuss her hospital visit with her PCP, since her main dx was sepsis. Patient stated they did EKG's and blood work she would like Dr. Johnsie Cancel to look at. Patient stated one doctor told her she has a murmur, and then another one told her she did not have a murmur. Will sent message to Dr. Johnsie Cancel for advisement.

## 2022-09-25 NOTE — Telephone Encounter (Signed)
Josue Hector, MD  You1 hour ago (3:43 PM)   No indication that anything abnormal with her heart on admission for sepsis. ECG ok My notes don't indicate any murmur and they did not do an echo. Sometimes if you are septic can develop a murmur from increased blood flow Can order and echo to check her heart    Called patient back to let her know Dr. Kyla Balzarine advisement. Patient would like to see her PCP tomorrow and then call our office if she decides to get echo.

## 2022-09-25 NOTE — Telephone Encounter (Signed)
Patient calling in to ask the the dr looks over her hospital stay at [facility] and let her know what he think. Please advise

## 2022-09-26 ENCOUNTER — Ambulatory Visit (INDEPENDENT_AMBULATORY_CARE_PROVIDER_SITE_OTHER): Payer: Medicare Other | Admitting: Family Medicine

## 2022-09-26 ENCOUNTER — Encounter: Payer: Self-pay | Admitting: Family Medicine

## 2022-09-26 VITALS — BP 109/70 | HR 104 | Ht 64.0 in | Wt 165.0 lb

## 2022-09-26 DIAGNOSIS — E1169 Type 2 diabetes mellitus with other specified complication: Secondary | ICD-10-CM

## 2022-09-26 DIAGNOSIS — I10 Essential (primary) hypertension: Secondary | ICD-10-CM

## 2022-09-26 DIAGNOSIS — R011 Cardiac murmur, unspecified: Secondary | ICD-10-CM | POA: Diagnosis not present

## 2022-09-26 DIAGNOSIS — C50411 Malignant neoplasm of upper-outer quadrant of right female breast: Secondary | ICD-10-CM

## 2022-09-26 DIAGNOSIS — K829 Disease of gallbladder, unspecified: Secondary | ICD-10-CM | POA: Insufficient documentation

## 2022-09-26 DIAGNOSIS — Z170 Estrogen receptor positive status [ER+]: Secondary | ICD-10-CM | POA: Diagnosis not present

## 2022-09-26 DIAGNOSIS — E1165 Type 2 diabetes mellitus with hyperglycemia: Secondary | ICD-10-CM | POA: Diagnosis not present

## 2022-09-26 DIAGNOSIS — C50919 Malignant neoplasm of unspecified site of unspecified female breast: Secondary | ICD-10-CM

## 2022-09-26 NOTE — Assessment & Plan Note (Signed)
Echocardiogram ordered.

## 2022-09-26 NOTE — Assessment & Plan Note (Signed)
Still undergoing evaluation of gallbladder.  Has upcoming HIDA scan.  No signs of active infection at this time.  Cultures in the hospital did not grow out any organisms.  Hypertension and tachycardia attributed to poor p.o. intake and dehydration.

## 2022-09-26 NOTE — Assessment & Plan Note (Signed)
Blood sugars were elevated this morning.  I instructed her to take glipizide when she returns home.  Continue to monitor blood sugars today.

## 2022-09-26 NOTE — Patient Instructions (Signed)
Echocardiogram An echocardiogram is a test that uses sound waves (ultrasound) to produce images of the heart. Images from an echocardiogram can provide important information about: Heart size and shape. The size and thickness and movement of your heart's walls. Heart muscle function and strength. Heart valve function or if you have stenosis. Stenosis is when the heart valves are too narrow. If blood is flowing backward through the heart valves (regurgitation). A tumor or infectious growth around the heart valves. Areas of heart muscle that are not working well because of poor blood flow or injury from a heart attack. Aneurysm detection. An aneurysm is a weak or damaged part of an artery wall. The wall bulges out from the normal force of blood pumping through the body. Tell a health care provider about: Any allergies you have. All medicines you are taking, including vitamins, herbs, eye drops, creams, and over-the-counter medicines. Any blood disorders you have. Any surgeries you have had. Any medical conditions you have. Whether you are pregnant or may be pregnant. What are the risks? Generally, this is a safe test. However, problems may occur, including an allergic reaction to dye (contrast) that may be used during the test. What happens before the test? No specific preparation is needed. You may eat and drink normally. What happens during the test?  You will take off your clothes from the waist up and put on a hospital gown. Electrodes or electrocardiogram (ECG)patches may be placed on your chest. The electrodes or patches are then connected to a device that monitors your heart rate and rhythm. You will lie down on a table for an ultrasound exam. A gel will be applied to your chest to help sound waves pass through your skin. A handheld device, called a transducer, will be pressed against your chest and moved over your heart. The transducer produces sound waves that travel to your heart  and bounce back (or "echo" back) to the transducer. These sound waves will be captured in real-time and changed into images of your heart that can be viewed on a video monitor. The images will be recorded on a computer and reviewed by your health care provider. You may be asked to change positions or hold your breath for a short time. This makes it easier to get different views or better views of your heart. In some cases, you may receive contrast through an IV in one of your veins. This can improve the quality of the pictures from your heart. The procedure may vary among health care providers and hospitals. What can I expect after the test? You may return to your normal, everyday life, including diet, activities, and medicines, unless your health care provider tells you not to do that. Follow these instructions at home: It is up to you to get the results of your test. Ask your health care provider, or the department that is doing the test, when your results will be ready. Keep all follow-up visits. This is important. Summary An echocardiogram is a test that uses sound waves (ultrasound) to produce images of the heart. Images from an echocardiogram can provide important information about the size and shape of your heart, heart muscle function, heart valve function, and other possible heart problems. You do not need to do anything to prepare before this test. You may eat and drink normally. After the echocardiogram is completed, you may return to your normal, everyday life, unless your health care provider tells you not to do that. This information is not  intended to replace advice given to you by your health care provider. Make sure you discuss any questions you have with your health care provider. Document Revised: 03/30/2021 Document Reviewed: 03/09/2020 Elsevier Patient Education  [place].

## 2022-09-26 NOTE — Assessment & Plan Note (Signed)
Recent CT scan during hospitalization does show or extensive liver disease that is increased in size.  She does have some abdominopelvic ascites as well and enlargement of the external iliac lymph nodes.  These are likely contributing to her lower extremity edema.

## 2022-09-26 NOTE — Progress Notes (Addendum)
AILED HANAS - 67 y.o. female MRN [id number]  Date of birth: [birth year]  Subjective Chief Complaint  Patient presents with   Hospitalization Follow-up    HPI [name] is a 67 year old female here today for hospital follow-up.  She was recently admitted to the hospital with increased fatigue and hypotension.  She had also reported having fever the night prior.  Recent diagnosis of possible cholecystitis.  She has seen gastroenterology as well as general surgery.  She does have an upcoming HIDA scan for further evaluation of this.  She had not been doing well with p.o. intake as well as fluid intake due to concern about irritating her gallbladder further.  White count was mildly elevated 11.2.  CT of the abdomen pelvis did not show any acute changes.  Some of the lesions on her liver have increased in size from previous CT scan.  Some pelvic ascites is noted as well.  Also noted to have enlargement of the right external iliac lymph node chain with some mass effect on the iliac vein which is likely contributing to her increased swelling in her lower extremities.  Sepsis was initially considered however no significant organ dysfunction and cultures were negative.  Her hypertension and tachycardia were attributed to her poor p.o. intake and hydration.  She reports she continues to feel fatigued but overall is doing better.  She is drinking more fluids.  Her appetite has not been very good.  Her blood sugar was elevated today at 450.  She has not taken glipizide today.  She does have HIDA scan scheduled for this afternoon.  ROS:  A comprehensive ROS was completed and negative except as noted per HPI  Allergies  Allergen Reactions   Penicillins Shortness Of Breath    Reaction as a child Did it involve swelling of the face/tongue/throat, SOB, or low BP? Yes Did it involve sudden or severe rash/hives, skin peeling, or any reaction on the inside of your mouth or nose?Unknown Did you need to seek medical  attention at a hospital or doctor's office? Yes When did it last happen? Childhood reaction.    If all above answers are "NO", may proceed with cephalosporin use.    Sulfa Antibiotics Nausea Only   Effexor [Venlafaxine] Other (See Comments)    "dopey"    Past Medical History:  Diagnosis Date   Anxiety    Asthma    Breast cancer ([place])    Chest pain, atypical    Closed head injury 06/06/2017   Depression    Heart palpitations    hx of MVP   Hypertension    IBS (irritable bowel syndrome)    Pre-diabetes     Past Surgical History:  Procedure Laterality Date   BREAST SURGERY  06/2018   right and left breast   eAB     x2    MASTECTOMY MODIFIED RADICAL Bilateral 10/10/2018   Procedure: RIGHT MODIFIED RADICAL MASTECTOMY AND LEFT PROPHYLATIC MASTECTOMY;  Surgeon: [name], MD;  Location: [facility];  Service: General;  Laterality: Bilateral;   PORTACATH PLACEMENT Left 08/12/2018   Procedure: INSERTION PORT-A-CATH;  Surgeon: [name], MD;  Location: [facility];  Service: General;  Laterality: Left;    Social History   Socioeconomic History   Marital status: Divorced    Spouse name: Not on file   Number of children: Not on file   Years of education: Not on file   Highest education level: Not on file  Occupational History  Not on file  Tobacco Use   Smoking status: Never   Smokeless tobacco: Never   Tobacco comments:    nonsmoker  Vaping Use   Vaping Use: Never used  Substance and Sexual Activity   Alcohol use: No   Drug use: No   Sexual activity: Not Currently    Birth control/protection: None  Other Topics Concern   Not on file  Social History Narrative   Divorced; grown son lives in [place].    [facility]- PCP   Social Determinants of Health   Financial Resource Strain: Not on file  Food Insecurity: Not on file  Transportation Needs: Not on file  Physical Activity: Not on file  Stress: Stress Concern Present (04/05/2022)    [place]    Feeling of Stress : To some extent  Social Connections: Not on file    Family History  Problem Relation Age of Onset   Heart attack Father    Lung cancer Mother    Bladder Cancer Brother     Health Maintenance  Topic Date Due   Diabetic kidney evaluation - eGFR measurement  12/31/2020   HEMOGLOBIN A1C  09/06/2022   Pneumonia Vaccine 80+ Years old (1 of 2 - PCV) 10/20/2022 (Originally [birth year]/1960)   DEXA SCAN  10/20/2022 (Originally 11/23/2019)   INFLUENZA VACCINE  10/29/2022 (Originally 02/28/2022)   Zoster Vaccines- Shingrix (1 of 2) 11/28/2022 (Originally 11/22/1973)   OPHTHALMOLOGY EXAM  12/30/2022 (Originally 11/03/2020)   Medicare Annual Wellness (AWV)  10/25/2023 (Originally June 28, 1956)   Diabetic kidney evaluation - Urine ACR  05/25/2023   FOOT EXAM  05/25/2023   DTaP/Tdap/Td (2 - Td or Tdap) 03/28/2028   COLONOSCOPY (Pts 45-54yr Insurance coverage will need to be confirmed)  05/03/2032   Hepatitis C Screening  Completed   HPV VACCINES  Aged Out   MAMMOGRAM  Discontinued   COVID-19 Vaccine  Discontinued     ----------------------------------------------------------------------------------------------------------------------------------------------------------------------------------------------------------------- Physical Exam BP 109/70 (BP Location: Right Arm, Patient Position: Sitting, Cuff Size: Large)   Pulse (!) 104   Ht '5\' 4"'$  (1.626 m)   Wt 165 lb (74.8 kg)   SpO2 99%   BMI 28.32 kg/m   Physical Exam Constitutional:      Appearance: Normal appearance.  HENT:     Head: Normocephalic and atraumatic.  Eyes:     General: No scleral icterus. Cardiovascular:     Rate and Rhythm: Normal rate and regular rhythm.     Heart sounds: Murmur (3-6 systolic ejection murmur) heard.  Pulmonary:     Effort: Pulmonary effort is normal.     Breath sounds: Normal breath sounds.  Neurological:      Mental Status: She is alert.  Psychiatric:        Mood and Affect: Mood normal.        Behavior: Behavior normal.     ------------------------------------------------------------------------------------------------------------------------------------------------------------------------------------------------------------------- Assessment and Plan  Type 2 diabetes mellitus with hyperglycemia (HCC) Blood sugars were elevated this morning.  I instructed her to take glipizide when she returns home.  Continue to monitor blood sugars today.  Stage IV breast cancer in female ([facility] Recent CT scan during hospitalization does show or extensive liver disease that is increased in size.  She does have some abdominopelvic ascites as well and enlargement of the external iliac lymph nodes.  These are likely contributing to her lower extremity edema.    Gallbladder disease Still undergoing evaluation of gallbladder.  Has upcoming HIDA scan.  No signs of active infection at this time.  Cultures in the hospital did not grow out any organisms.  Hypertension and tachycardia attributed to poor p.o. intake and dehydration.  Systolic murmur Echocardiogram ordered.   No orders of the defined types were placed in this encounter.   No follow-ups on file.    This visit occurred during the SARS-CoV-2 public health emergency.  Safety protocols were in place, including screening questions prior to the visit, additional usage of staff PPE, and extensive cleaning of exam room while observing appropriate contact time as indicated for disinfecting solutions.

## 2022-09-27 ENCOUNTER — Encounter: Payer: Self-pay | Admitting: Family Medicine

## 2022-09-27 DIAGNOSIS — R7989 Other specified abnormal findings of blood chemistry: Secondary | ICD-10-CM | POA: Diagnosis not present

## 2022-09-27 DIAGNOSIS — Z7901 Long term (current) use of anticoagulants: Secondary | ICD-10-CM | POA: Diagnosis not present

## 2022-09-27 DIAGNOSIS — N390 Urinary tract infection, site not specified: Secondary | ICD-10-CM | POA: Diagnosis not present

## 2022-09-27 DIAGNOSIS — C787 Secondary malignant neoplasm of liver and intrahepatic bile duct: Secondary | ICD-10-CM | POA: Diagnosis not present

## 2022-09-27 DIAGNOSIS — Z86718 Personal history of other venous thrombosis and embolism: Secondary | ICD-10-CM | POA: Diagnosis not present

## 2022-09-27 DIAGNOSIS — Z853 Personal history of malignant neoplasm of breast: Secondary | ICD-10-CM | POA: Diagnosis not present

## 2022-09-27 DIAGNOSIS — J45909 Unspecified asthma, uncomplicated: Secondary | ICD-10-CM | POA: Diagnosis not present

## 2022-09-27 DIAGNOSIS — Z880 Allergy status to penicillin: Secondary | ICD-10-CM | POA: Diagnosis not present

## 2022-09-27 DIAGNOSIS — Z888 Allergy status to other drugs, medicaments and biological substances status: Secondary | ICD-10-CM | POA: Diagnosis not present

## 2022-09-27 DIAGNOSIS — Z1152 Encounter for screening for COVID-19: Secondary | ICD-10-CM | POA: Diagnosis not present

## 2022-09-27 DIAGNOSIS — Z79899 Other long term (current) drug therapy: Secondary | ICD-10-CM | POA: Diagnosis not present

## 2022-09-27 DIAGNOSIS — Z882 Allergy status to sulfonamides status: Secondary | ICD-10-CM | POA: Diagnosis not present

## 2022-09-27 LAB — COMPLETE METABOLIC PANEL WITH GFR
AG Ratio: 1.1 (calc) (ref 1.0–2.5)
ALT: 37 U/L — ABNORMAL HIGH (ref 6–29)
AST: 130 U/L — ABNORMAL HIGH (ref 10–35)
Albumin: 3.1 g/dL — ABNORMAL LOW (ref 3.6–5.1)
Alkaline phosphatase (APISO): 720 U/L — ABNORMAL HIGH (ref 37–153)
BUN/Creatinine Ratio: 38 (calc) — ABNORMAL HIGH (ref 6–22)
BUN: 52 mg/dL — ABNORMAL HIGH (ref 7–25)
CO2: 23 mmol/L (ref 20–32)
Calcium: 9.8 mg/dL (ref 8.6–10.4)
Chloride: 95 mmol/L — ABNORMAL LOW (ref 98–110)
Creat: 1.38 mg/dL — ABNORMAL HIGH (ref 0.50–1.05)
Globulin: 2.9 g/dL (calc) (ref 1.9–3.7)
Glucose, Bld: 447 mg/dL — ABNORMAL HIGH (ref 65–99)
Potassium: 5.7 mmol/L — ABNORMAL HIGH (ref 3.5–5.3)
Sodium: 130 mmol/L — ABNORMAL LOW (ref 135–146)
Total Bilirubin: 9.6 mg/dL — ABNORMAL HIGH (ref 0.2–1.2)
Total Protein: 6 g/dL — ABNORMAL LOW (ref 6.1–8.1)
eGFR: 42 mL/min/{1.73_m2} — ABNORMAL LOW (ref >=60)

## 2022-09-27 LAB — CBC WITH DIFFERENTIAL/PLATELET
Absolute Monocytes: 1032 cells/uL — ABNORMAL HIGH (ref 200–950)
Basophils Absolute: 262 cells/uL — ABNORMAL HIGH (ref 0–200)
Basophils Relative: 1.7 %
Eosinophils Absolute: 15 cells/uL (ref 15–500)
Eosinophils Relative: 0.1 %
HCT: 33.3 % — ABNORMAL LOW (ref 35.0–45.0)
Hemoglobin: 10.4 g/dL — ABNORMAL LOW (ref 11.7–15.5)
Lymphs Abs: 477 cells/uL — ABNORMAL LOW (ref 850–3900)
MCH: 27.7 pg (ref 27.0–33.0)
MCHC: 31.2 g/dL — ABNORMAL LOW (ref 32.0–36.0)
MCV: 88.8 fL (ref 80.0–100.0)
MPV: 9.9 fL (ref 7.5–12.5)
Monocytes Relative: 6.7 %
Neutro Abs: 13614 cells/uL — ABNORMAL HIGH (ref 1500–7800)
Neutrophils Relative %: 88.4 %
Platelets: 506 10*3/uL — ABNORMAL HIGH (ref 140–400)
RBC: 3.75 10*6/uL — ABNORMAL LOW (ref 3.80–5.10)
RDW: 14.1 % (ref 11.0–15.0)
Total Lymphocyte: 3.1 %
WBC: 15.4 10*3/uL — ABNORMAL HIGH (ref 3.8–10.8)

## 2022-09-28 ENCOUNTER — Encounter: Payer: Self-pay | Admitting: Family Medicine

## 2022-09-28 DIAGNOSIS — I10 Essential (primary) hypertension: Secondary | ICD-10-CM | POA: Diagnosis not present

## 2022-09-28 DIAGNOSIS — C787 Secondary malignant neoplasm of liver and intrahepatic bile duct: Secondary | ICD-10-CM | POA: Diagnosis not present

## 2022-09-28 DIAGNOSIS — Z9189 Other specified personal risk factors, not elsewhere classified: Secondary | ICD-10-CM | POA: Diagnosis not present

## 2022-09-28 DIAGNOSIS — C50811 Malignant neoplasm of overlapping sites of right female breast: Secondary | ICD-10-CM | POA: Diagnosis not present

## 2022-09-28 DIAGNOSIS — Z7901 Long term (current) use of anticoagulants: Secondary | ICD-10-CM | POA: Diagnosis not present

## 2022-09-28 DIAGNOSIS — Z5111 Encounter for antineoplastic chemotherapy: Secondary | ICD-10-CM | POA: Diagnosis not present

## 2022-09-28 DIAGNOSIS — C7951 Secondary malignant neoplasm of bone: Secondary | ICD-10-CM | POA: Diagnosis not present

## 2022-09-28 DIAGNOSIS — I82411 Acute embolism and thrombosis of right femoral vein: Secondary | ICD-10-CM | POA: Diagnosis not present

## 2022-09-28 DIAGNOSIS — Z170 Estrogen receptor positive status [ER+]: Secondary | ICD-10-CM | POA: Diagnosis not present

## 2022-09-29 DIAGNOSIS — C50811 Malignant neoplasm of overlapping sites of right female breast: Secondary | ICD-10-CM | POA: Diagnosis not present

## 2022-09-29 DIAGNOSIS — C787 Secondary malignant neoplasm of liver and intrahepatic bile duct: Secondary | ICD-10-CM | POA: Diagnosis not present

## 2022-09-29 DIAGNOSIS — Z170 Estrogen receptor positive status [ER+]: Secondary | ICD-10-CM | POA: Diagnosis not present

## 2022-09-29 DIAGNOSIS — C7951 Secondary malignant neoplasm of bone: Secondary | ICD-10-CM | POA: Diagnosis not present

## 2022-09-29 DIAGNOSIS — Z452 Encounter for adjustment and management of vascular access device: Secondary | ICD-10-CM | POA: Diagnosis not present

## 2022-10-01 ENCOUNTER — Other Ambulatory Visit: Payer: Self-pay | Admitting: Family Medicine

## 2022-10-01 ENCOUNTER — Encounter: Payer: Self-pay | Admitting: Family Medicine

## 2022-10-05 DIAGNOSIS — C50811 Malignant neoplasm of overlapping sites of right female breast: Secondary | ICD-10-CM | POA: Diagnosis not present

## 2022-10-05 DIAGNOSIS — Z79899 Other long term (current) drug therapy: Secondary | ICD-10-CM | POA: Diagnosis not present

## 2022-10-05 DIAGNOSIS — C787 Secondary malignant neoplasm of liver and intrahepatic bile duct: Secondary | ICD-10-CM | POA: Diagnosis not present

## 2022-10-05 DIAGNOSIS — Z170 Estrogen receptor positive status [ER+]: Secondary | ICD-10-CM | POA: Diagnosis not present

## 2022-10-05 DIAGNOSIS — Z7952 Long term (current) use of systemic steroids: Secondary | ICD-10-CM | POA: Diagnosis not present

## 2022-10-05 DIAGNOSIS — Z5111 Encounter for antineoplastic chemotherapy: Secondary | ICD-10-CM | POA: Diagnosis not present

## 2022-10-06 ENCOUNTER — Encounter: Payer: Self-pay | Admitting: Family Medicine

## 2022-10-06 ENCOUNTER — Telehealth: Payer: Self-pay

## 2022-10-06 NOTE — Telephone Encounter (Signed)
Patient advised. She states she will do what she thinks is right.

## 2022-10-06 NOTE — Telephone Encounter (Signed)
[name] called and is very upset about her elevated glucose. She states after breakfast her glucose was 216 mg/dl. She started a new chemo treatment. Before each treatment she has to take a prep with prednisone. She wanted to know if she should continue taking the medication for diabetes.

## 2022-10-09 ENCOUNTER — Encounter: Payer: Self-pay | Admitting: Family Medicine

## 2022-10-12 DIAGNOSIS — C7951 Secondary malignant neoplasm of bone: Secondary | ICD-10-CM | POA: Diagnosis not present

## 2022-10-12 DIAGNOSIS — Z5111 Encounter for antineoplastic chemotherapy: Secondary | ICD-10-CM | POA: Diagnosis not present

## 2022-10-12 DIAGNOSIS — Z170 Estrogen receptor positive status [ER+]: Secondary | ICD-10-CM | POA: Diagnosis not present

## 2022-10-12 DIAGNOSIS — C50811 Malignant neoplasm of overlapping sites of right female breast: Secondary | ICD-10-CM | POA: Diagnosis not present

## 2022-10-12 DIAGNOSIS — C787 Secondary malignant neoplasm of liver and intrahepatic bile duct: Secondary | ICD-10-CM | POA: Diagnosis not present

## 2022-10-14 DIAGNOSIS — Z86718 Personal history of other venous thrombosis and embolism: Secondary | ICD-10-CM | POA: Diagnosis not present

## 2022-10-14 DIAGNOSIS — C787 Secondary malignant neoplasm of liver and intrahepatic bile duct: Secondary | ICD-10-CM | POA: Diagnosis not present

## 2022-10-14 DIAGNOSIS — Z7984 Long term (current) use of oral hypoglycemic drugs: Secondary | ICD-10-CM | POA: Diagnosis not present

## 2022-10-14 DIAGNOSIS — Z9221 Personal history of antineoplastic chemotherapy: Secondary | ICD-10-CM | POA: Diagnosis not present

## 2022-10-14 DIAGNOSIS — Z743 Need for continuous supervision: Secondary | ICD-10-CM | POA: Diagnosis not present

## 2022-10-14 DIAGNOSIS — Z880 Allergy status to penicillin: Secondary | ICD-10-CM | POA: Diagnosis not present

## 2022-10-14 DIAGNOSIS — I10 Essential (primary) hypertension: Secondary | ICD-10-CM | POA: Diagnosis not present

## 2022-10-14 DIAGNOSIS — Z882 Allergy status to sulfonamides status: Secondary | ICD-10-CM | POA: Diagnosis not present

## 2022-10-14 DIAGNOSIS — Z7901 Long term (current) use of anticoagulants: Secondary | ICD-10-CM | POA: Diagnosis not present

## 2022-10-14 DIAGNOSIS — R000 Tachycardia, unspecified: Secondary | ICD-10-CM | POA: Diagnosis not present

## 2022-10-14 DIAGNOSIS — Z9013 Acquired absence of bilateral breasts and nipples: Secondary | ICD-10-CM | POA: Diagnosis not present

## 2022-10-14 DIAGNOSIS — C50919 Malignant neoplasm of unspecified site of unspecified female breast: Secondary | ICD-10-CM | POA: Diagnosis not present

## 2022-10-14 DIAGNOSIS — C7951 Secondary malignant neoplasm of bone: Secondary | ICD-10-CM | POA: Diagnosis not present

## 2022-10-14 DIAGNOSIS — K5900 Constipation, unspecified: Secondary | ICD-10-CM | POA: Diagnosis not present

## 2022-10-14 DIAGNOSIS — Z79899 Other long term (current) drug therapy: Secondary | ICD-10-CM | POA: Diagnosis not present

## 2022-10-14 DIAGNOSIS — I499 Cardiac arrhythmia, unspecified: Secondary | ICD-10-CM | POA: Diagnosis not present

## 2022-10-16 DIAGNOSIS — R29898 Other symptoms and signs involving the musculoskeletal system: Secondary | ICD-10-CM | POA: Diagnosis not present

## 2022-10-16 DIAGNOSIS — M79604 Pain in right leg: Secondary | ICD-10-CM | POA: Diagnosis not present

## 2022-10-16 DIAGNOSIS — Z853 Personal history of malignant neoplasm of breast: Secondary | ICD-10-CM | POA: Diagnosis not present

## 2022-10-16 DIAGNOSIS — I890 Lymphedema, not elsewhere classified: Secondary | ICD-10-CM | POA: Diagnosis not present

## 2022-10-16 DIAGNOSIS — Z86718 Personal history of other venous thrombosis and embolism: Secondary | ICD-10-CM | POA: Diagnosis not present

## 2022-10-16 DIAGNOSIS — R5383 Other fatigue: Secondary | ICD-10-CM | POA: Diagnosis not present

## 2022-10-19 DIAGNOSIS — T451X5A Adverse effect of antineoplastic and immunosuppressive drugs, initial encounter: Secondary | ICD-10-CM | POA: Diagnosis not present

## 2022-10-19 DIAGNOSIS — R7989 Other specified abnormal findings of blood chemistry: Secondary | ICD-10-CM | POA: Diagnosis not present

## 2022-10-19 DIAGNOSIS — I82411 Acute embolism and thrombosis of right femoral vein: Secondary | ICD-10-CM | POA: Diagnosis not present

## 2022-10-19 DIAGNOSIS — Z5111 Encounter for antineoplastic chemotherapy: Secondary | ICD-10-CM | POA: Diagnosis not present

## 2022-10-19 DIAGNOSIS — D6481 Anemia due to antineoplastic chemotherapy: Secondary | ICD-10-CM | POA: Diagnosis not present

## 2022-10-19 DIAGNOSIS — C7951 Secondary malignant neoplasm of bone: Secondary | ICD-10-CM | POA: Diagnosis not present

## 2022-10-19 DIAGNOSIS — C50811 Malignant neoplasm of overlapping sites of right female breast: Secondary | ICD-10-CM | POA: Diagnosis not present

## 2022-10-19 DIAGNOSIS — Z170 Estrogen receptor positive status [ER+]: Secondary | ICD-10-CM | POA: Diagnosis not present

## 2022-10-19 DIAGNOSIS — C787 Secondary malignant neoplasm of liver and intrahepatic bile duct: Secondary | ICD-10-CM | POA: Diagnosis not present

## 2022-10-19 DIAGNOSIS — R945 Abnormal results of liver function studies: Secondary | ICD-10-CM | POA: Diagnosis not present

## 2022-10-24 ENCOUNTER — Encounter: Payer: Self-pay | Admitting: Family Medicine

## 2022-10-24 ENCOUNTER — Other Ambulatory Visit: Payer: Self-pay | Admitting: Family Medicine

## 2022-10-24 ENCOUNTER — Ambulatory Visit (INDEPENDENT_AMBULATORY_CARE_PROVIDER_SITE_OTHER): Payer: Medicare Other | Admitting: Family Medicine

## 2022-10-24 VITALS — BP 143/53 | HR 104 | Resp 18 | Ht 64.0 in | Wt 174.0 lb

## 2022-10-24 DIAGNOSIS — C787 Secondary malignant neoplasm of liver and intrahepatic bile duct: Secondary | ICD-10-CM

## 2022-10-24 DIAGNOSIS — R600 Localized edema: Secondary | ICD-10-CM | POA: Diagnosis not present

## 2022-10-24 DIAGNOSIS — J90 Pleural effusion, not elsewhere classified: Secondary | ICD-10-CM

## 2022-10-24 DIAGNOSIS — R0602 Shortness of breath: Secondary | ICD-10-CM

## 2022-10-24 DIAGNOSIS — R918 Other nonspecific abnormal finding of lung field: Secondary | ICD-10-CM | POA: Diagnosis not present

## 2022-10-24 LAB — D-DIMER, QUANTITATIVE: D-Dimer, Quant: 10.63 mcg/mL FEU — ABNORMAL HIGH (ref <0.50)

## 2022-10-24 NOTE — Assessment & Plan Note (Addendum)
-  Patient presents today with shortness of breath and tells me she feels like she cannot take a good deep breath.  Vitals show tachycardia at 104.  She does have a history of cancer and is currently on treatment.  She is prescribed Eliquis 5 mg however she has not been taking this medication twice daily and has only been taking it once a day sometimes.  Based on Wells criteria with her history of cancer with mets along with immobilization for a few days due to her severe lymphedema bilaterally coupled with her tachycardia she is at moderate risk for PE.  I have ordered a stat CT PE to rule out.  Also ordered D-dimer stat.

## 2022-10-24 NOTE — Progress Notes (Signed)
Acute Office Visit  Subjective:     Patient ID: [name], female    DOB: [birth year], 67 y.o.   MRN: [id number]  Chief Complaint  Patient presents with   Cough    Pt c/o of dry cough and sob onset for 2 weeks    HPI Patient is in today for shortness of breath.  She has a past medical history of cancer with mets to the liver and is currently undergoing treatment.  She said for the past week or so she has been getting more short of breath.  She is immobile in her house due to her lymphedema bilaterally.  She feels like she cannot take good deep breaths and is also trying to cough to get more air in.  Review of Systems  Constitutional:  Negative for chills and fever.  Respiratory:  Positive for cough and shortness of breath.   Cardiovascular:  Negative for chest pain.  Neurological:  Negative for headaches.        Objective:    BP (!) 143/53   Pulse (!) 104   Resp 18   Ht 5\' 4"  (1.626 m)   Wt 174 lb (78.9 kg)   SpO2 100%   BMI 29.87 kg/m    Physical Exam Vitals and nursing note reviewed.  Constitutional:      General: She is not in acute distress.    Appearance: Normal appearance.  HENT:     Head: Normocephalic and atraumatic.     Right Ear: External ear normal.     Left Ear: External ear normal.     Nose: Nose normal.  Eyes:     Conjunctiva/sclera: Conjunctivae normal.  Cardiovascular:     Rate and Rhythm: Normal rate and regular rhythm.  Pulmonary:     Effort: Pulmonary effort is normal.     Breath sounds: Normal breath sounds.  Musculoskeletal:     Comments: Lymphedema bilaterally  Neurological:     General: No focal deficit present.     Mental Status: She is alert and oriented to person, place, and time.  Psychiatric:        Mood and Affect: Mood normal.        Behavior: Behavior normal.        Thought Content: Thought content normal.        Judgment: Judgment normal.     No results found for any visits on 10/24/22.      Assessment & Plan:    Problem List Items Addressed This Visit       Digestive   Malignant neoplasm metastatic to liver [facility])   Relevant Orders   CT Angio Chest Pulmonary Embolism (PE) W or WO Contrast   D-Dimer, Quantitative     Other   Shortness of breath - Primary    -Patient presents today with shortness of breath and tells me she feels like she cannot take a good deep breath.  Vitals show tachycardia at 104.  She does have a history of cancer and is currently on treatment.  She is prescribed Eliquis 5 mg however she has not been taking this medication twice daily and has only been taking it once a day sometimes.  Based on Wells criteria with her history of cancer with mets along with immobilization for a few days due to her severe lymphedema bilaterally coupled with her tachycardia she is at moderate risk for PE.  I have ordered a stat CT PE to rule out.  Also ordered D-dimer  stat.       Relevant Orders   CT Angio Chest Pulmonary Embolism (PE) W or WO Contrast   D-Dimer, Quantitative    No orders of the defined types were placed in this encounter.     Owens Loffler, DO

## 2022-10-26 DIAGNOSIS — Z170 Estrogen receptor positive status [ER+]: Secondary | ICD-10-CM | POA: Diagnosis not present

## 2022-10-26 DIAGNOSIS — D6481 Anemia due to antineoplastic chemotherapy: Secondary | ICD-10-CM | POA: Diagnosis not present

## 2022-10-26 DIAGNOSIS — T451X5A Adverse effect of antineoplastic and immunosuppressive drugs, initial encounter: Secondary | ICD-10-CM | POA: Diagnosis not present

## 2022-10-26 DIAGNOSIS — Z5111 Encounter for antineoplastic chemotherapy: Secondary | ICD-10-CM | POA: Diagnosis not present

## 2022-10-26 DIAGNOSIS — C7951 Secondary malignant neoplasm of bone: Secondary | ICD-10-CM | POA: Diagnosis not present

## 2022-10-26 DIAGNOSIS — C50811 Malignant neoplasm of overlapping sites of right female breast: Secondary | ICD-10-CM | POA: Diagnosis not present

## 2022-10-26 DIAGNOSIS — C787 Secondary malignant neoplasm of liver and intrahepatic bile duct: Secondary | ICD-10-CM | POA: Diagnosis not present

## 2022-10-27 DIAGNOSIS — D6481 Anemia due to antineoplastic chemotherapy: Secondary | ICD-10-CM | POA: Diagnosis not present

## 2022-10-27 DIAGNOSIS — S0083XA Contusion of other part of head, initial encounter: Secondary | ICD-10-CM | POA: Diagnosis not present

## 2022-10-27 DIAGNOSIS — Z7901 Long term (current) use of anticoagulants: Secondary | ICD-10-CM | POA: Diagnosis not present

## 2022-10-27 DIAGNOSIS — T451X5A Adverse effect of antineoplastic and immunosuppressive drugs, initial encounter: Secondary | ICD-10-CM | POA: Diagnosis not present

## 2022-10-27 DIAGNOSIS — I10 Essential (primary) hypertension: Secondary | ICD-10-CM | POA: Diagnosis not present

## 2022-10-27 DIAGNOSIS — Z85830 Personal history of malignant neoplasm of bone: Secondary | ICD-10-CM | POA: Diagnosis not present

## 2022-10-27 DIAGNOSIS — S0990XA Unspecified injury of head, initial encounter: Secondary | ICD-10-CM | POA: Diagnosis not present

## 2022-10-27 DIAGNOSIS — Z880 Allergy status to penicillin: Secondary | ICD-10-CM | POA: Diagnosis not present

## 2022-10-27 DIAGNOSIS — C50811 Malignant neoplasm of overlapping sites of right female breast: Secondary | ICD-10-CM | POA: Diagnosis not present

## 2022-10-27 DIAGNOSIS — Z7984 Long term (current) use of oral hypoglycemic drugs: Secondary | ICD-10-CM | POA: Diagnosis not present

## 2022-10-27 DIAGNOSIS — Z853 Personal history of malignant neoplasm of breast: Secondary | ICD-10-CM | POA: Diagnosis not present

## 2022-10-27 DIAGNOSIS — Z23 Encounter for immunization: Secondary | ICD-10-CM | POA: Diagnosis not present

## 2022-10-27 DIAGNOSIS — Z8505 Personal history of malignant neoplasm of liver: Secondary | ICD-10-CM | POA: Diagnosis not present

## 2022-10-27 DIAGNOSIS — R296 Repeated falls: Secondary | ICD-10-CM | POA: Diagnosis not present

## 2022-10-27 DIAGNOSIS — Z86718 Personal history of other venous thrombosis and embolism: Secondary | ICD-10-CM | POA: Diagnosis not present

## 2022-10-27 DIAGNOSIS — Z882 Allergy status to sulfonamides status: Secondary | ICD-10-CM | POA: Diagnosis not present

## 2022-10-27 DIAGNOSIS — J90 Pleural effusion, not elsewhere classified: Secondary | ICD-10-CM | POA: Diagnosis not present

## 2022-10-27 DIAGNOSIS — Z170 Estrogen receptor positive status [ER+]: Secondary | ICD-10-CM | POA: Diagnosis not present

## 2022-10-27 DIAGNOSIS — Z79899 Other long term (current) drug therapy: Secondary | ICD-10-CM | POA: Diagnosis not present

## 2022-10-27 DIAGNOSIS — S0081XA Abrasion of other part of head, initial encounter: Secondary | ICD-10-CM | POA: Diagnosis not present

## 2022-10-27 DIAGNOSIS — C7951 Secondary malignant neoplasm of bone: Secondary | ICD-10-CM | POA: Diagnosis not present

## 2022-10-27 DIAGNOSIS — Z9221 Personal history of antineoplastic chemotherapy: Secondary | ICD-10-CM | POA: Diagnosis not present

## 2022-10-27 DIAGNOSIS — M542 Cervicalgia: Secondary | ICD-10-CM | POA: Diagnosis not present

## 2022-10-30 ENCOUNTER — Other Ambulatory Visit: Payer: Self-pay | Admitting: Family Medicine

## 2022-10-30 ENCOUNTER — Telehealth: Payer: Self-pay | Admitting: Family Medicine

## 2022-10-30 MED ORDER — ALBUTEROL SULFATE HFA 108 (90 BASE) MCG/ACT IN AERS: 2.0000 | INHALATION_SPRAY | Freq: Four times a day (QID) | RESPIRATORY_TRACT | 1 refills | Status: DC | PRN

## 2022-10-30 NOTE — Telephone Encounter (Signed)
Pt called requesting a refill of albuterol (VENTOLIN HFA) 108 (90 Base) MCG/ACT inhaler [id number] . Pt stated her current one is two years old.

## 2022-10-30 NOTE — Telephone Encounter (Signed)
Completed by Dr. Mel Almond

## 2022-10-31 DIAGNOSIS — C787 Secondary malignant neoplasm of liver and intrahepatic bile duct: Secondary | ICD-10-CM | POA: Diagnosis not present

## 2022-11-01 DIAGNOSIS — I2102 ST elevation (STEMI) myocardial infarction involving left anterior descending coronary artery: Secondary | ICD-10-CM | POA: Diagnosis not present

## 2022-11-01 DIAGNOSIS — J810 Acute pulmonary edema: Secondary | ICD-10-CM | POA: Diagnosis not present

## 2022-11-01 DIAGNOSIS — Z743 Need for continuous supervision: Secondary | ICD-10-CM | POA: Diagnosis not present

## 2022-11-01 DIAGNOSIS — S0083XA Contusion of other part of head, initial encounter: Secondary | ICD-10-CM | POA: Diagnosis not present

## 2022-11-01 DIAGNOSIS — C50919 Malignant neoplasm of unspecified site of unspecified female breast: Secondary | ICD-10-CM | POA: Diagnosis not present

## 2022-11-01 DIAGNOSIS — Z5111 Encounter for antineoplastic chemotherapy: Secondary | ICD-10-CM | POA: Diagnosis not present

## 2022-11-01 DIAGNOSIS — K2970 Gastritis, unspecified, without bleeding: Secondary | ICD-10-CM | POA: Diagnosis not present

## 2022-11-01 DIAGNOSIS — I890 Lymphedema, not elsewhere classified: Secondary | ICD-10-CM | POA: Diagnosis not present

## 2022-11-01 DIAGNOSIS — I2582 Chronic total occlusion of coronary artery: Secondary | ICD-10-CM | POA: Diagnosis not present

## 2022-11-01 DIAGNOSIS — R5381 Other malaise: Secondary | ICD-10-CM | POA: Diagnosis not present

## 2022-11-01 DIAGNOSIS — I5189 Other ill-defined heart diseases: Secondary | ICD-10-CM | POA: Diagnosis not present

## 2022-11-01 DIAGNOSIS — Z452 Encounter for adjustment and management of vascular access device: Secondary | ICD-10-CM | POA: Diagnosis not present

## 2022-11-01 DIAGNOSIS — Z1152 Encounter for screening for COVID-19: Secondary | ICD-10-CM | POA: Diagnosis not present

## 2022-11-01 DIAGNOSIS — Z86718 Personal history of other venous thrombosis and embolism: Secondary | ICD-10-CM | POA: Diagnosis not present

## 2022-11-01 DIAGNOSIS — J45998 Other asthma: Secondary | ICD-10-CM | POA: Diagnosis not present

## 2022-11-01 DIAGNOSIS — J9601 Acute respiratory failure with hypoxia: Secondary | ICD-10-CM | POA: Diagnosis not present

## 2022-11-01 DIAGNOSIS — R7989 Other specified abnormal findings of blood chemistry: Secondary | ICD-10-CM | POA: Diagnosis not present

## 2022-11-01 DIAGNOSIS — K7290 Hepatic failure, unspecified without coma: Secondary | ICD-10-CM | POA: Diagnosis not present

## 2022-11-01 DIAGNOSIS — R0600 Dyspnea, unspecified: Secondary | ICD-10-CM | POA: Diagnosis not present

## 2022-11-01 DIAGNOSIS — Z515 Encounter for palliative care: Secondary | ICD-10-CM | POA: Diagnosis not present

## 2022-11-01 DIAGNOSIS — C50811 Malignant neoplasm of overlapping sites of right female breast: Secondary | ICD-10-CM | POA: Diagnosis not present

## 2022-11-01 DIAGNOSIS — Z170 Estrogen receptor positive status [ER+]: Secondary | ICD-10-CM | POA: Diagnosis not present

## 2022-11-01 DIAGNOSIS — D84821 Immunodeficiency due to drugs: Secondary | ICD-10-CM | POA: Diagnosis not present

## 2022-11-01 DIAGNOSIS — R918 Other nonspecific abnormal finding of lung field: Secondary | ICD-10-CM | POA: Diagnosis not present

## 2022-11-01 DIAGNOSIS — D6481 Anemia due to antineoplastic chemotherapy: Secondary | ICD-10-CM | POA: Diagnosis not present

## 2022-11-01 DIAGNOSIS — C787 Secondary malignant neoplasm of liver and intrahepatic bile duct: Secondary | ICD-10-CM | POA: Diagnosis not present

## 2022-11-01 DIAGNOSIS — D849 Immunodeficiency, unspecified: Secondary | ICD-10-CM | POA: Diagnosis not present

## 2022-11-01 DIAGNOSIS — C7951 Secondary malignant neoplasm of bone: Secondary | ICD-10-CM | POA: Diagnosis not present

## 2022-11-01 DIAGNOSIS — R000 Tachycardia, unspecified: Secondary | ICD-10-CM | POA: Diagnosis not present

## 2022-11-01 DIAGNOSIS — I82411 Acute embolism and thrombosis of right femoral vein: Secondary | ICD-10-CM | POA: Diagnosis not present

## 2022-11-01 DIAGNOSIS — I10 Essential (primary) hypertension: Secondary | ICD-10-CM | POA: Diagnosis not present

## 2022-11-01 DIAGNOSIS — E1142 Type 2 diabetes mellitus with diabetic polyneuropathy: Secondary | ICD-10-CM | POA: Diagnosis not present

## 2022-11-01 DIAGNOSIS — I2510 Atherosclerotic heart disease of native coronary artery without angina pectoris: Secondary | ICD-10-CM | POA: Diagnosis not present

## 2022-11-01 DIAGNOSIS — I771 Stricture of artery: Secondary | ICD-10-CM | POA: Diagnosis not present

## 2022-11-01 DIAGNOSIS — K5900 Constipation, unspecified: Secondary | ICD-10-CM | POA: Diagnosis not present

## 2022-11-01 DIAGNOSIS — Z7189 Other specified counseling: Secondary | ICD-10-CM | POA: Diagnosis not present

## 2022-11-01 DIAGNOSIS — R0902 Hypoxemia: Secondary | ICD-10-CM | POA: Diagnosis not present

## 2022-11-01 DIAGNOSIS — C799 Secondary malignant neoplasm of unspecified site: Secondary | ICD-10-CM | POA: Diagnosis not present

## 2022-11-01 DIAGNOSIS — E860 Dehydration: Secondary | ICD-10-CM | POA: Diagnosis not present

## 2022-11-01 DIAGNOSIS — R0989 Other specified symptoms and signs involving the circulatory and respiratory systems: Secondary | ICD-10-CM | POA: Diagnosis not present

## 2022-11-01 DIAGNOSIS — D649 Anemia, unspecified: Secondary | ICD-10-CM | POA: Diagnosis not present

## 2022-11-01 DIAGNOSIS — R062 Wheezing: Secondary | ICD-10-CM | POA: Diagnosis not present

## 2022-11-01 DIAGNOSIS — Z8614 Personal history of Methicillin resistant Staphylococcus aureus infection: Secondary | ICD-10-CM | POA: Diagnosis not present

## 2022-11-01 DIAGNOSIS — E1165 Type 2 diabetes mellitus with hyperglycemia: Secondary | ICD-10-CM | POA: Diagnosis not present

## 2022-11-01 DIAGNOSIS — T451X5A Adverse effect of antineoplastic and immunosuppressive drugs, initial encounter: Secondary | ICD-10-CM | POA: Diagnosis not present

## 2022-11-01 DIAGNOSIS — E876 Hypokalemia: Secondary | ICD-10-CM | POA: Diagnosis not present

## 2022-11-01 DIAGNOSIS — Z9189 Other specified personal risk factors, not elsewhere classified: Secondary | ICD-10-CM | POA: Diagnosis not present

## 2022-11-01 DIAGNOSIS — J90 Pleural effusion, not elsewhere classified: Secondary | ICD-10-CM | POA: Diagnosis not present

## 2022-11-01 DIAGNOSIS — I2109 ST elevation (STEMI) myocardial infarction involving other coronary artery of anterior wall: Secondary | ICD-10-CM | POA: Diagnosis not present

## 2022-11-01 DIAGNOSIS — I517 Cardiomegaly: Secondary | ICD-10-CM | POA: Diagnosis not present

## 2022-11-05 DIAGNOSIS — I771 Stricture of artery: Secondary | ICD-10-CM | POA: Diagnosis not present

## 2022-11-05 DIAGNOSIS — Z9189 Other specified personal risk factors, not elsewhere classified: Secondary | ICD-10-CM | POA: Diagnosis not present

## 2022-11-05 DIAGNOSIS — R7989 Other specified abnormal findings of blood chemistry: Secondary | ICD-10-CM | POA: Diagnosis not present

## 2022-11-05 DIAGNOSIS — I5021 Acute systolic (congestive) heart failure: Secondary | ICD-10-CM | POA: Diagnosis not present

## 2022-11-05 DIAGNOSIS — E11649 Type 2 diabetes mellitus with hypoglycemia without coma: Secondary | ICD-10-CM | POA: Diagnosis not present

## 2022-11-05 DIAGNOSIS — R0600 Dyspnea, unspecified: Secondary | ICD-10-CM | POA: Diagnosis not present

## 2022-11-05 DIAGNOSIS — D696 Thrombocytopenia, unspecified: Secondary | ICD-10-CM | POA: Diagnosis not present

## 2022-11-05 DIAGNOSIS — N179 Acute kidney failure, unspecified: Secondary | ICD-10-CM | POA: Diagnosis not present

## 2022-11-05 DIAGNOSIS — I82411 Acute embolism and thrombosis of right femoral vein: Secondary | ICD-10-CM | POA: Diagnosis not present

## 2022-11-05 DIAGNOSIS — I083 Combined rheumatic disorders of mitral, aortic and tricuspid valves: Secondary | ICD-10-CM | POA: Diagnosis not present

## 2022-11-05 DIAGNOSIS — I2102 ST elevation (STEMI) myocardial infarction involving left anterior descending coronary artery: Secondary | ICD-10-CM | POA: Diagnosis not present

## 2022-11-05 DIAGNOSIS — J9601 Acute respiratory failure with hypoxia: Secondary | ICD-10-CM | POA: Diagnosis not present

## 2022-11-05 DIAGNOSIS — E872 Acidosis, unspecified: Secondary | ICD-10-CM | POA: Diagnosis not present

## 2022-11-05 DIAGNOSIS — I5189 Other ill-defined heart diseases: Secondary | ICD-10-CM | POA: Diagnosis not present

## 2022-11-05 DIAGNOSIS — R570 Cardiogenic shock: Secondary | ICD-10-CM | POA: Diagnosis not present

## 2022-11-05 DIAGNOSIS — I2582 Chronic total occlusion of coronary artery: Secondary | ICD-10-CM | POA: Diagnosis not present

## 2022-11-05 DIAGNOSIS — C7951 Secondary malignant neoplasm of bone: Secondary | ICD-10-CM | POA: Diagnosis not present

## 2022-11-05 DIAGNOSIS — I2510 Atherosclerotic heart disease of native coronary artery without angina pectoris: Secondary | ICD-10-CM | POA: Diagnosis not present

## 2022-11-05 DIAGNOSIS — C50919 Malignant neoplasm of unspecified site of unspecified female breast: Secondary | ICD-10-CM | POA: Diagnosis not present

## 2022-11-05 DIAGNOSIS — I081 Rheumatic disorders of both mitral and tricuspid valves: Secondary | ICD-10-CM | POA: Diagnosis not present

## 2022-11-05 DIAGNOSIS — I2109 ST elevation (STEMI) myocardial infarction involving other coronary artery of anterior wall: Secondary | ICD-10-CM | POA: Diagnosis not present

## 2022-11-05 DIAGNOSIS — Z66 Do not resuscitate: Secondary | ICD-10-CM | POA: Diagnosis not present

## 2022-11-05 DIAGNOSIS — C50811 Malignant neoplasm of overlapping sites of right female breast: Secondary | ICD-10-CM | POA: Diagnosis not present

## 2022-11-05 DIAGNOSIS — T451X5A Adverse effect of antineoplastic and immunosuppressive drugs, initial encounter: Secondary | ICD-10-CM | POA: Diagnosis not present

## 2022-11-05 DIAGNOSIS — I110 Hypertensive heart disease with heart failure: Secondary | ICD-10-CM | POA: Diagnosis not present

## 2022-11-05 DIAGNOSIS — R918 Other nonspecific abnormal finding of lung field: Secondary | ICD-10-CM | POA: Diagnosis not present

## 2022-11-05 DIAGNOSIS — D630 Anemia in neoplastic disease: Secondary | ICD-10-CM | POA: Diagnosis not present

## 2022-11-05 DIAGNOSIS — J4530 Mild persistent asthma, uncomplicated: Secondary | ICD-10-CM | POA: Diagnosis not present

## 2022-11-05 DIAGNOSIS — C787 Secondary malignant neoplasm of liver and intrahepatic bile duct: Secondary | ICD-10-CM | POA: Diagnosis not present

## 2022-11-05 DIAGNOSIS — J45998 Other asthma: Secondary | ICD-10-CM | POA: Diagnosis not present

## 2022-11-05 DIAGNOSIS — E1165 Type 2 diabetes mellitus with hyperglycemia: Secondary | ICD-10-CM | POA: Diagnosis not present

## 2022-11-05 DIAGNOSIS — Z5111 Encounter for antineoplastic chemotherapy: Secondary | ICD-10-CM | POA: Diagnosis not present

## 2022-11-05 DIAGNOSIS — K2970 Gastritis, unspecified, without bleeding: Secondary | ICD-10-CM | POA: Diagnosis not present

## 2022-11-05 DIAGNOSIS — R54 Age-related physical debility: Secondary | ICD-10-CM | POA: Diagnosis not present

## 2022-11-05 DIAGNOSIS — D6481 Anemia due to antineoplastic chemotherapy: Secondary | ICD-10-CM | POA: Diagnosis not present

## 2022-11-05 DIAGNOSIS — I10 Essential (primary) hypertension: Secondary | ICD-10-CM | POA: Diagnosis not present

## 2022-11-06 DIAGNOSIS — I083 Combined rheumatic disorders of mitral, aortic and tricuspid valves: Secondary | ICD-10-CM | POA: Diagnosis not present

## 2022-11-06 DIAGNOSIS — I2109 ST elevation (STEMI) myocardial infarction involving other coronary artery of anterior wall: Secondary | ICD-10-CM | POA: Diagnosis not present

## 2022-11-07 DIAGNOSIS — I2109 ST elevation (STEMI) myocardial infarction involving other coronary artery of anterior wall: Secondary | ICD-10-CM | POA: Diagnosis not present

## 2022-11-29 DEATH — deceased
